# Patient Record
Sex: Female | Born: 1950 | Race: White | Hispanic: No | Smoking: Former smoker
Health system: Southern US, Community
[De-identification: ages and names within clinical notes are randomized; demographics above are authoritative.]

## PROBLEM LIST (undated history)

## (undated) DIAGNOSIS — E7800 Pure hypercholesterolemia, unspecified: Secondary | ICD-10-CM

## (undated) DIAGNOSIS — J189 Pneumonia, unspecified organism: Secondary | ICD-10-CM

## (undated) DIAGNOSIS — I10 Essential (primary) hypertension: Secondary | ICD-10-CM

## (undated) DIAGNOSIS — E119 Type 2 diabetes mellitus without complications: Secondary | ICD-10-CM

## (undated) HISTORY — PX: COLONOSCOPY W/ BIOPSIES AND POLYPECTOMY: SHX1376

---

## 2003-06-06 ENCOUNTER — Ambulatory Visit (HOSPITAL_COMMUNITY): Admission: RE | Admit: 2003-06-06 | Discharge: 2003-06-06 | Payer: Self-pay | Admitting: *Deleted

## 2013-01-07 ENCOUNTER — Other Ambulatory Visit (HOSPITAL_COMMUNITY)
Admission: RE | Admit: 2013-01-07 | Discharge: 2013-01-07 | Disposition: A | Discharge status: Home / Self Care | Payer: BC Managed Care – PPO | Source: Ambulatory Visit | Attending: Family Medicine | Admitting: Family Medicine

## 2013-01-07 ENCOUNTER — Other Ambulatory Visit: Payer: Self-pay | Admitting: Family Medicine

## 2013-01-07 DIAGNOSIS — Z124 Encounter for screening for malignant neoplasm of cervix: Secondary | ICD-10-CM | POA: Insufficient documentation

## 2013-03-22 ENCOUNTER — Other Ambulatory Visit: Payer: Self-pay | Admitting: Gastroenterology

## 2015-06-05 ENCOUNTER — Other Ambulatory Visit: Payer: Self-pay | Admitting: Gastroenterology

## 2017-05-13 DIAGNOSIS — Z0000 Encounter for general adult medical examination without abnormal findings: Secondary | ICD-10-CM | POA: Diagnosis not present

## 2017-05-13 DIAGNOSIS — E785 Hyperlipidemia, unspecified: Secondary | ICD-10-CM | POA: Diagnosis not present

## 2017-05-13 DIAGNOSIS — I129 Hypertensive chronic kidney disease with stage 1 through stage 4 chronic kidney disease, or unspecified chronic kidney disease: Secondary | ICD-10-CM | POA: Diagnosis not present

## 2017-05-13 DIAGNOSIS — Z23 Encounter for immunization: Secondary | ICD-10-CM | POA: Diagnosis not present

## 2017-05-13 DIAGNOSIS — E1121 Type 2 diabetes mellitus with diabetic nephropathy: Secondary | ICD-10-CM | POA: Diagnosis not present

## 2017-05-13 DIAGNOSIS — N182 Chronic kidney disease, stage 2 (mild): Secondary | ICD-10-CM | POA: Diagnosis not present

## 2017-09-22 DIAGNOSIS — E1121 Type 2 diabetes mellitus with diabetic nephropathy: Secondary | ICD-10-CM | POA: Diagnosis not present

## 2017-09-22 DIAGNOSIS — R197 Diarrhea, unspecified: Secondary | ICD-10-CM | POA: Diagnosis not present

## 2017-10-07 DIAGNOSIS — E860 Dehydration: Secondary | ICD-10-CM | POA: Diagnosis not present

## 2017-12-16 DIAGNOSIS — N182 Chronic kidney disease, stage 2 (mild): Secondary | ICD-10-CM | POA: Diagnosis not present

## 2017-12-16 DIAGNOSIS — E785 Hyperlipidemia, unspecified: Secondary | ICD-10-CM | POA: Diagnosis not present

## 2017-12-16 DIAGNOSIS — I129 Hypertensive chronic kidney disease with stage 1 through stage 4 chronic kidney disease, or unspecified chronic kidney disease: Secondary | ICD-10-CM | POA: Diagnosis not present

## 2017-12-16 DIAGNOSIS — E1121 Type 2 diabetes mellitus with diabetic nephropathy: Secondary | ICD-10-CM | POA: Diagnosis not present

## 2018-02-18 ENCOUNTER — Other Ambulatory Visit: Payer: Self-pay

## 2018-02-18 ENCOUNTER — Emergency Department (HOSPITAL_COMMUNITY)
Admission: EM | Admit: 2018-02-18 | Discharge: 2018-02-18 | Disposition: A | Discharge status: Home / Self Care | Payer: BLUE CROSS/BLUE SHIELD | Attending: Emergency Medicine | Admitting: Emergency Medicine

## 2018-02-18 ENCOUNTER — Emergency Department (HOSPITAL_COMMUNITY): Payer: BLUE CROSS/BLUE SHIELD

## 2018-02-18 ENCOUNTER — Encounter (HOSPITAL_COMMUNITY): Payer: Self-pay

## 2018-02-18 DIAGNOSIS — I10 Essential (primary) hypertension: Secondary | ICD-10-CM | POA: Insufficient documentation

## 2018-02-18 DIAGNOSIS — R197 Diarrhea, unspecified: Secondary | ICD-10-CM | POA: Diagnosis not present

## 2018-02-18 DIAGNOSIS — R112 Nausea with vomiting, unspecified: Secondary | ICD-10-CM | POA: Insufficient documentation

## 2018-02-18 DIAGNOSIS — R55 Syncope and collapse: Secondary | ICD-10-CM | POA: Diagnosis not present

## 2018-02-18 DIAGNOSIS — I959 Hypotension, unspecified: Secondary | ICD-10-CM | POA: Diagnosis not present

## 2018-02-18 DIAGNOSIS — E119 Type 2 diabetes mellitus without complications: Secondary | ICD-10-CM | POA: Insufficient documentation

## 2018-02-18 DIAGNOSIS — R109 Unspecified abdominal pain: Secondary | ICD-10-CM | POA: Diagnosis not present

## 2018-02-18 DIAGNOSIS — K4090 Unilateral inguinal hernia, without obstruction or gangrene, not specified as recurrent: Secondary | ICD-10-CM | POA: Diagnosis not present

## 2018-02-18 HISTORY — DX: Pure hypercholesterolemia, unspecified: E78.00

## 2018-02-18 HISTORY — DX: Essential (primary) hypertension: I10

## 2018-02-18 HISTORY — DX: Type 2 diabetes mellitus without complications: E11.9

## 2018-02-18 LAB — URINALYSIS, ROUTINE W REFLEX MICROSCOPIC
Bilirubin Urine: NEGATIVE
GLUCOSE, UA: NEGATIVE mg/dL
Ketones, ur: NEGATIVE mg/dL
Nitrite: NEGATIVE
PROTEIN: NEGATIVE mg/dL
Specific Gravity, Urine: 1.038 — ABNORMAL HIGH (ref 1.005–1.030)
pH: 5 (ref 5.0–8.0)

## 2018-02-18 LAB — C DIFFICILE QUICK SCREEN W PCR REFLEX
C Diff antigen: NEGATIVE
C Diff interpretation: NOT DETECTED
C Diff toxin: NEGATIVE

## 2018-02-18 LAB — LIPASE, BLOOD: Lipase: 42 U/L (ref 11–51)

## 2018-02-18 LAB — COMPREHENSIVE METABOLIC PANEL
ALBUMIN: 4.4 g/dL (ref 3.5–5.0)
ALK PHOS: 70 U/L (ref 38–126)
ALT: 18 U/L (ref 0–44)
AST: 21 U/L (ref 15–41)
Anion gap: 13 (ref 5–15)
BUN: 22 mg/dL (ref 8–23)
CALCIUM: 9.5 mg/dL (ref 8.9–10.3)
CHLORIDE: 107 mmol/L (ref 98–111)
CO2: 21 mmol/L — AB (ref 22–32)
CREATININE: 1.22 mg/dL — AB (ref 0.44–1.00)
GFR calc Af Amer: 52 mL/min — ABNORMAL LOW (ref >60)
GFR calc non Af Amer: 45 mL/min — ABNORMAL LOW (ref >60)
Glucose, Bld: 194 mg/dL — ABNORMAL HIGH (ref 70–99)
Potassium: 4.4 mmol/L (ref 3.5–5.1)
SODIUM: 141 mmol/L (ref 135–145)
Total Bilirubin: 1.5 mg/dL — ABNORMAL HIGH (ref 0.3–1.2)
Total Protein: 7.6 g/dL (ref 6.5–8.1)

## 2018-02-18 LAB — CBC
HCT: 45.4 % (ref 36.0–46.0)
Hemoglobin: 15.2 g/dL — ABNORMAL HIGH (ref 12.0–15.0)
MCH: 31.9 pg (ref 26.0–34.0)
MCHC: 33.5 g/dL (ref 30.0–36.0)
MCV: 95.4 fL (ref 78.0–100.0)
PLATELETS: 192 10*3/uL (ref 150–400)
RBC: 4.76 MIL/uL (ref 3.87–5.11)
RDW: 13 % (ref 11.5–15.5)
WBC: 19.6 10*3/uL — ABNORMAL HIGH (ref 4.0–10.5)

## 2018-02-18 MED ORDER — IOPAMIDOL (ISOVUE-370) INJECTION 76%
100.0000 mL | Freq: Once | INTRAVENOUS | Status: AC | PRN
  Administered 2018-02-18: 100 mL via INTRAVENOUS

## 2018-02-18 MED ORDER — LOPERAMIDE HCL 2 MG PO CAPS
4.0000 mg | ORAL_CAPSULE | Freq: Once | ORAL | Status: AC
  Administered 2018-02-18: 4 mg via ORAL
  Filled 2018-02-18: qty 2

## 2018-02-18 MED ORDER — SODIUM CHLORIDE 0.9 % IV BOLUS
1000.0000 mL | Freq: Once | INTRAVENOUS | Status: AC
  Administered 2018-02-18: 1000 mL via INTRAVENOUS

## 2018-02-18 MED ORDER — IOPAMIDOL (ISOVUE-300) INJECTION 61%
INTRAVENOUS | Status: AC
  Filled 2018-02-18: qty 100

## 2018-02-18 NOTE — ED Notes (Signed)
Patient verbalizes understanding of discharge instructions. Opportunity for questioning and answers were provided. 

## 2018-02-18 NOTE — Discharge Instructions (Addendum)
You were evaluated today for vomiting and diarrhea.  The results of your CT scan were negative.  He did have a mild elevation in your white count however we have not found a reason for this.  Your C. difficile cultures from your stool were negative.  Please follow-up with your primary care provider return to the ED if you experience any of the following symptoms  You have chest pain. You feel very weak or you pass out (faint). You have bloody or black poop or poop that look like tar. You have very bad pain, cramping, or bloating in your belly (abdomen). You have trouble breathing or you are breathing very quickly. Your heart is beating very quickly. Your skin feels cold and clammy. You feel confused. You have signs of dehydration, such as: Dark pee, hardly any pee, or no pee. Cracked lips. Dry mouth. Sunken eyes. Sleepiness. Weakness.

## 2018-02-18 NOTE — ED Notes (Signed)
ED Provider at bedside. 

## 2018-02-18 NOTE — ED Notes (Signed)
Patient transported to CT 

## 2018-02-18 NOTE — ED Notes (Signed)
Pt given diet ginger ale.

## 2018-02-18 NOTE — ED Provider Notes (Addendum)
[facility] EMERGENCY DEPARTMENT Provider Note   CSN: [id number] Arrival date & time: 02/18/18  1559   History   Chief Complaint Chief Complaint  Patient presents with  . Diarrhea  . Emesis    HPI Holly Stevenson is a 67 y.o. female with a PMHx significant for DM, HTN who presents for evaluation of sudden onset vomiting and diarrhea.  States she has had 2 episodes of emesis which consisted of her lunch food and 7 episodes of brown diarrhea.  States two episodes of diarrhea were light in color and mucousy.  Denies headache, chest pain, SOB,abdominal pain, constipation, rashes, urinary symptoms. Denies recent travel, sick contacts, antibiotic use. Has had previous episodes of similar nature. 4 x in 3 months. Had a stool culture which returned negative. Episodes self resolve in past.  HPI  Past Medical History:  Diagnosis Date  . Diabetes mellitus without complication (HCC)   . High cholesterol   . Hypertension     There are no active problems to display for this patient.   History reviewed. No pertinent surgical history.   OB History   None      Home Medications    Prior to Admission medications   Not on File    Family History No family history on file.  Social History Social History   Tobacco Use  . Smoking status: Not on file  Substance Use Topics  . Alcohol use: Not on file  . Drug use: Not on file     Allergies   Patient has no allergy information on record.   Review of Systems Review of Systems Review of systems negative unless otherwise stated in the HPI.  Physical Exam Updated Vital Signs BP (!) 162/53   Pulse 69   Temp 99.6 F (37.6 C) (Oral)   Resp 18   Ht 5\' 7"  (1.702 m)   Wt 79.4 kg   SpO2 96%   BMI 27.41 kg/m   Physical Exam  Constitutional: She appears well-developed and well-nourished. No distress.  HENT:  Head: Normocephalic and atraumatic.  Eyes: Pupils are equal, round, and reactive to light.  Conjunctivae are normal.  Neck: Normal range of motion. Neck supple.  Cardiovascular: Normal rate, regular rhythm, normal heart sounds and intact distal pulses.  No murmur heard. Pulmonary/Chest: Effort normal. No respiratory distress.  Abdominal: Soft. Bowel sounds are normal. She exhibits no shifting dullness, no distension, no pulsatile liver, no fluid wave, no abdominal bruit, no ascites, no pulsatile midline mass and no mass. There is no hepatosplenomegaly. There is no tenderness. There is no rigidity, no rebound, no guarding, no CVA tenderness, no tenderness at McBurney's point and negative Murphy's sign. No hernia.  Musculoskeletal: Normal range of motion.  Neurological: She is alert.  Skin: Skin is warm and dry. She is not diaphoretic.  Psychiatric: She has a normal mood and affect.  Nursing note and vitals reviewed.    ED Treatments / Results  Labs (all labs ordered are listed, but only abnormal results are displayed) Labs Reviewed  COMPREHENSIVE METABOLIC PANEL - Abnormal; Notable for the following components:      Result Value   CO2 21 (*)    Glucose, Bld 194 (*)    Creatinine, Ser 1.22 (*)    Total Bilirubin 1.5 (*)    GFR calc non Af Amer 45 (*)    GFR calc Af Amer 52 (*)    All other components within normal limits  CBC - Abnormal; Notable  for the following components:   WBC 19.6 (*)    Hemoglobin 15.2 (*)    All other components within normal limits  C DIFFICILE QUICK SCREEN W PCR REFLEX  LIPASE, BLOOD  URINALYSIS, ROUTINE W REFLEX MICROSCOPIC    EKG None  Radiology Ct Abdomen Pelvis W Contrast  Result Date: 02/18/2018 CLINICAL DATA:  67 year old female with sudden onset nausea vomiting and diarrhea. Abdominal pain. EXAM: CT ABDOMEN AND PELVIS WITH CONTRAST TECHNIQUE: Multidetector CT imaging of the abdomen and pelvis was performed using the standard protocol following bolus administration of intravenous contrast. CONTRAST:  ISOVUE-370 IOPAMIDOL  (ISOVUE-370) INJECTION 76% COMPARISON:  None. FINDINGS: Lower chest: Negative lung bases. No pericardial or pleural effusion. Hepatobiliary: Negative liver and gallbladder. No biliary ductal enlargement. Pancreas: Negative. Spleen: Negative. Adrenals/Urinary Tract: Normal adrenal glands. Bilateral renal enhancement and contrast excretion is symmetric and normal. Mild nonspecific bilateral perinephric stranding. Negative course of both ureters. Mild bladder wall thickening which may be related to small bladder volume. No perivesical stranding. Stomach/Bowel: Decompressed distal colon containing some fluid. Mild diverticulosis of the descending and sigmoid colon with no active inflammation. Decompressed transverse colon containing fluid. Fluid in the right colon. Normal appendix (coronal image 48). Negative terminal ileum. No dilated or inflamed small bowel. Small gastric hiatal hernia. Negative intraabdominal stomach, duodenum. No abdominal free air, free fluid. Vascular/Lymphatic: Aortoiliac calcified atherosclerosis. Major arterial structures in the abdomen and pelvis remain patent. Portal venous system is patent. No lymphadenopathy. Reproductive: Negative. Other: No pelvic free fluid. Small fat containing left inguinal hernia. Musculoskeletal: No acute osseous abnormality identified. Lumbar spine degeneration and spinal stenosis (L3-L4 series 3, image 43). No acute osseous abnormality identified. IMPRESSION: 1. Fluid throughout the large bowel compatible with diarrhea but otherwise no acute or inflammatory process in the abdomen or pelvis. Normal appendix. 2.  Aortic Atherosclerosis (ICD10-I70.0). Electronically Signed   By: Odessa Fleming M.D.   On: 02/18/2018 18:02    Procedures Procedures (including critical care time)  Medications Ordered in ED Medications  iopamidol (ISOVUE-300) 61 % injection (has no administration in time range)  sodium chloride 0.9 % bolus 1,000 mL (0 mLs Intravenous Stopped 02/18/18 1809)   iopamidol (ISOVUE-370) 76 % injection 100 mL (100 mLs Intravenous Contrast Given 02/18/18 1739)     Initial Impression / Assessment and Plan / ED Course  I have reviewed the triage vital signs and the nursing notes as well as past medical history.  Pertinent labs & imaging results that were available during my care of the patient were reviewed by me and considered in my medical decision making (see chart for details).  Presents for evaluation of emesis and diarrhea x 6 hours. Afebrile, non-septic appearing, non-ill appearing. Diarrhea with mucous. Nausea resolved with Zofran. Will obtain CT abd/pelvis, labs and C.Diff screen and reevaluate.  CT with fluid throughout consistent with diarrhea. Labs with Leukocytosis at 19.6. C.Diff negative. No rashes or urinary symptoms.  Mildly elevated creatinine at 1.22 .  Discussed with patient she will need recheck at primary care for this. Serial abd exams benign. Patient able to tolerate PO challenge. Patient suitable for dc home. Will dc with referral to GI (she prefers [facility]) as she has had episodes previously. 4x in the last 3 months for further evaluation. VSS. Discussed strict return precautions with patient. Patient verbalizes understanding.    20:22 Patient did not want to wait for urine results. Urine with Leukocytes. Nitrite negative. No urinary symptoms. No pharmacy in Epic. Tried to reach  patient on phone number provided however phone is disconnected.  Final Clinical Impressions(s) / ED Diagnoses   Final diagnoses:  Diarrhea, unspecified type  Non-intractable vomiting with nausea, unspecified vomiting type    ED Discharge Orders    None       Stephannie Broner A, PA-C 02/18/18 1947    Minta Fair A, PA-C 02/18/18 2044    Sabas Sous, MD 02/18/18 2257

## 2018-02-18 NOTE — ED Triage Notes (Addendum)
Pt bib ems from her work c.o sudden onset n/v/d around 1230pm. Pt reports 2 episodes of emesis and 7 episodes of diarrhea. Pt given 4mg  zofran IV and saline en route. Pt now denies nausea. Pt denies abd pain, chest pain or sob. Pt a.o upon arrival.  BP 152/51 HR64 96% on room air. CBG 180

## 2018-03-03 DIAGNOSIS — R197 Diarrhea, unspecified: Secondary | ICD-10-CM | POA: Diagnosis not present

## 2018-03-03 DIAGNOSIS — R194 Change in bowel habit: Secondary | ICD-10-CM | POA: Diagnosis not present

## 2018-03-03 DIAGNOSIS — R112 Nausea with vomiting, unspecified: Secondary | ICD-10-CM | POA: Diagnosis not present

## 2018-03-23 DIAGNOSIS — E1121 Type 2 diabetes mellitus with diabetic nephropathy: Secondary | ICD-10-CM | POA: Diagnosis not present

## 2018-07-03 DIAGNOSIS — Z1159 Encounter for screening for other viral diseases: Secondary | ICD-10-CM | POA: Diagnosis not present

## 2018-07-03 DIAGNOSIS — E785 Hyperlipidemia, unspecified: Secondary | ICD-10-CM | POA: Diagnosis not present

## 2018-07-03 DIAGNOSIS — Z23 Encounter for immunization: Secondary | ICD-10-CM | POA: Diagnosis not present

## 2018-07-03 DIAGNOSIS — E1121 Type 2 diabetes mellitus with diabetic nephropathy: Secondary | ICD-10-CM | POA: Diagnosis not present

## 2018-07-03 DIAGNOSIS — Z0000 Encounter for general adult medical examination without abnormal findings: Secondary | ICD-10-CM | POA: Diagnosis not present

## 2019-01-19 DIAGNOSIS — I129 Hypertensive chronic kidney disease with stage 1 through stage 4 chronic kidney disease, or unspecified chronic kidney disease: Secondary | ICD-10-CM | POA: Diagnosis not present

## 2019-01-19 DIAGNOSIS — E781 Pure hyperglyceridemia: Secondary | ICD-10-CM | POA: Diagnosis not present

## 2019-01-19 DIAGNOSIS — E1121 Type 2 diabetes mellitus with diabetic nephropathy: Secondary | ICD-10-CM | POA: Diagnosis not present

## 2019-01-19 DIAGNOSIS — N182 Chronic kidney disease, stage 2 (mild): Secondary | ICD-10-CM | POA: Diagnosis not present

## 2019-10-05 DIAGNOSIS — Z0000 Encounter for general adult medical examination without abnormal findings: Secondary | ICD-10-CM | POA: Diagnosis not present

## 2019-10-05 DIAGNOSIS — E1121 Type 2 diabetes mellitus with diabetic nephropathy: Secondary | ICD-10-CM | POA: Diagnosis not present

## 2020-04-17 HISTORY — PX: VASCULAR SURGERY: SHX849

## 2020-04-24 ENCOUNTER — Other Ambulatory Visit: Payer: Self-pay

## 2020-04-24 ENCOUNTER — Encounter (HOSPITAL_COMMUNITY): Payer: Self-pay | Admitting: Emergency Medicine

## 2020-04-24 ENCOUNTER — Inpatient Hospital Stay (HOSPITAL_COMMUNITY)
Admission: EM | Admit: 2020-04-24 | Discharge: 2020-04-29 | DRG: 300 | Disposition: A | Discharge status: Home / Self Care | Payer: BC Managed Care – PPO | Attending: Internal Medicine | Admitting: Internal Medicine

## 2020-04-24 DIAGNOSIS — E7800 Pure hypercholesterolemia, unspecified: Secondary | ICD-10-CM | POA: Diagnosis present

## 2020-04-24 DIAGNOSIS — I739 Peripheral vascular disease, unspecified: Secondary | ICD-10-CM

## 2020-04-24 DIAGNOSIS — E119 Type 2 diabetes mellitus without complications: Secondary | ICD-10-CM

## 2020-04-24 DIAGNOSIS — E11621 Type 2 diabetes mellitus with foot ulcer: Secondary | ICD-10-CM | POA: Diagnosis present

## 2020-04-24 DIAGNOSIS — L97529 Non-pressure chronic ulcer of other part of left foot with unspecified severity: Secondary | ICD-10-CM | POA: Diagnosis present

## 2020-04-24 DIAGNOSIS — I10 Essential (primary) hypertension: Secondary | ICD-10-CM | POA: Diagnosis present

## 2020-04-24 DIAGNOSIS — Z79899 Other long term (current) drug therapy: Secondary | ICD-10-CM

## 2020-04-24 DIAGNOSIS — J441 Chronic obstructive pulmonary disease with (acute) exacerbation: Secondary | ICD-10-CM

## 2020-04-24 DIAGNOSIS — Z7984 Long term (current) use of oral hypoglycemic drugs: Secondary | ICD-10-CM

## 2020-04-24 DIAGNOSIS — M009 Pyogenic arthritis, unspecified: Secondary | ICD-10-CM | POA: Diagnosis present

## 2020-04-24 DIAGNOSIS — I70229 Atherosclerosis of native arteries of extremities with rest pain, unspecified extremity: Secondary | ICD-10-CM

## 2020-04-24 DIAGNOSIS — M1A0620 Idiopathic chronic gout, left knee, without tophus (tophi): Secondary | ICD-10-CM

## 2020-04-24 DIAGNOSIS — E1152 Type 2 diabetes mellitus with diabetic peripheral angiopathy with gangrene: Secondary | ICD-10-CM | POA: Diagnosis not present

## 2020-04-24 DIAGNOSIS — Z20822 Contact with and (suspected) exposure to covid-19: Secondary | ICD-10-CM | POA: Diagnosis present

## 2020-04-24 DIAGNOSIS — I129 Hypertensive chronic kidney disease with stage 1 through stage 4 chronic kidney disease, or unspecified chronic kidney disease: Secondary | ICD-10-CM | POA: Diagnosis present

## 2020-04-24 DIAGNOSIS — E1140 Type 2 diabetes mellitus with diabetic neuropathy, unspecified: Secondary | ICD-10-CM | POA: Diagnosis present

## 2020-04-24 DIAGNOSIS — L089 Local infection of the skin and subcutaneous tissue, unspecified: Secondary | ICD-10-CM

## 2020-04-24 DIAGNOSIS — F17200 Nicotine dependence, unspecified, uncomplicated: Secondary | ICD-10-CM | POA: Diagnosis present

## 2020-04-24 DIAGNOSIS — Z23 Encounter for immunization: Secondary | ICD-10-CM

## 2020-04-24 DIAGNOSIS — L03032 Cellulitis of left toe: Principal | ICD-10-CM

## 2020-04-24 DIAGNOSIS — E785 Hyperlipidemia, unspecified: Secondary | ICD-10-CM | POA: Diagnosis present

## 2020-04-24 DIAGNOSIS — I96 Gangrene, not elsewhere classified: Secondary | ICD-10-CM

## 2020-04-24 DIAGNOSIS — E11628 Type 2 diabetes mellitus with other skin complications: Secondary | ICD-10-CM | POA: Diagnosis present

## 2020-04-24 DIAGNOSIS — J984 Other disorders of lung: Secondary | ICD-10-CM

## 2020-04-24 DIAGNOSIS — L03116 Cellulitis of left lower limb: Secondary | ICD-10-CM | POA: Diagnosis present

## 2020-04-24 DIAGNOSIS — J449 Chronic obstructive pulmonary disease, unspecified: Secondary | ICD-10-CM | POA: Diagnosis present

## 2020-04-24 DIAGNOSIS — N1831 Chronic kidney disease, stage 3a: Secondary | ICD-10-CM | POA: Diagnosis present

## 2020-04-24 DIAGNOSIS — E1122 Type 2 diabetes mellitus with diabetic chronic kidney disease: Secondary | ICD-10-CM | POA: Diagnosis present

## 2020-04-24 DIAGNOSIS — N183 Chronic kidney disease, stage 3 unspecified: Secondary | ICD-10-CM | POA: Diagnosis present

## 2020-04-24 DIAGNOSIS — I70263 Atherosclerosis of native arteries of extremities with gangrene, bilateral legs: Secondary | ICD-10-CM | POA: Diagnosis present

## 2020-04-24 NOTE — ED Triage Notes (Signed)
Pt c/o left food numbness that she has on and off for months started radiating to her knee during the past few days, pt denies any injury.

## 2020-04-25 ENCOUNTER — Inpatient Hospital Stay (HOSPITAL_COMMUNITY): Payer: BC Managed Care – PPO

## 2020-04-25 ENCOUNTER — Emergency Department (HOSPITAL_COMMUNITY): Payer: BC Managed Care – PPO

## 2020-04-25 ENCOUNTER — Encounter (HOSPITAL_COMMUNITY): Payer: Self-pay | Admitting: Certified Registered Nurse Anesthetist

## 2020-04-25 ENCOUNTER — Encounter (HOSPITAL_COMMUNITY): Payer: Self-pay | Admitting: Emergency Medicine

## 2020-04-25 DIAGNOSIS — Z23 Encounter for immunization: Secondary | ICD-10-CM | POA: Diagnosis not present

## 2020-04-25 DIAGNOSIS — F17200 Nicotine dependence, unspecified, uncomplicated: Secondary | ICD-10-CM | POA: Diagnosis present

## 2020-04-25 DIAGNOSIS — I739 Peripheral vascular disease, unspecified: Secondary | ICD-10-CM | POA: Diagnosis present

## 2020-04-25 DIAGNOSIS — E119 Type 2 diabetes mellitus without complications: Secondary | ICD-10-CM

## 2020-04-25 DIAGNOSIS — L089 Local infection of the skin and subcutaneous tissue, unspecified: Secondary | ICD-10-CM | POA: Diagnosis not present

## 2020-04-25 DIAGNOSIS — Z79899 Other long term (current) drug therapy: Secondary | ICD-10-CM | POA: Diagnosis not present

## 2020-04-25 DIAGNOSIS — E7800 Pure hypercholesterolemia, unspecified: Secondary | ICD-10-CM | POA: Diagnosis present

## 2020-04-25 DIAGNOSIS — I70229 Atherosclerosis of native arteries of extremities with rest pain, unspecified extremity: Secondary | ICD-10-CM | POA: Diagnosis not present

## 2020-04-25 DIAGNOSIS — E1152 Type 2 diabetes mellitus with diabetic peripheral angiopathy with gangrene: Secondary | ICD-10-CM | POA: Diagnosis present

## 2020-04-25 DIAGNOSIS — E1122 Type 2 diabetes mellitus with diabetic chronic kidney disease: Secondary | ICD-10-CM

## 2020-04-25 DIAGNOSIS — E785 Hyperlipidemia, unspecified: Secondary | ICD-10-CM | POA: Diagnosis present

## 2020-04-25 DIAGNOSIS — N1831 Chronic kidney disease, stage 3a: Secondary | ICD-10-CM

## 2020-04-25 DIAGNOSIS — J441 Chronic obstructive pulmonary disease with (acute) exacerbation: Secondary | ICD-10-CM

## 2020-04-25 DIAGNOSIS — J984 Other disorders of lung: Secondary | ICD-10-CM

## 2020-04-25 DIAGNOSIS — I96 Gangrene, not elsewhere classified: Secondary | ICD-10-CM | POA: Diagnosis not present

## 2020-04-25 DIAGNOSIS — R7881 Bacteremia: Secondary | ICD-10-CM

## 2020-04-25 DIAGNOSIS — E11628 Type 2 diabetes mellitus with other skin complications: Secondary | ICD-10-CM | POA: Diagnosis present

## 2020-04-25 DIAGNOSIS — M1A0620 Idiopathic chronic gout, left knee, without tophus (tophi): Secondary | ICD-10-CM | POA: Diagnosis present

## 2020-04-25 DIAGNOSIS — I10 Essential (primary) hypertension: Secondary | ICD-10-CM

## 2020-04-25 DIAGNOSIS — M009 Pyogenic arthritis, unspecified: Secondary | ICD-10-CM | POA: Diagnosis present

## 2020-04-25 DIAGNOSIS — E11621 Type 2 diabetes mellitus with foot ulcer: Secondary | ICD-10-CM | POA: Diagnosis present

## 2020-04-25 DIAGNOSIS — L03032 Cellulitis of left toe: Secondary | ICD-10-CM | POA: Diagnosis present

## 2020-04-25 DIAGNOSIS — J449 Chronic obstructive pulmonary disease, unspecified: Secondary | ICD-10-CM | POA: Diagnosis present

## 2020-04-25 DIAGNOSIS — L97529 Non-pressure chronic ulcer of other part of left foot with unspecified severity: Secondary | ICD-10-CM | POA: Diagnosis present

## 2020-04-25 DIAGNOSIS — I129 Hypertensive chronic kidney disease with stage 1 through stage 4 chronic kidney disease, or unspecified chronic kidney disease: Secondary | ICD-10-CM | POA: Diagnosis present

## 2020-04-25 DIAGNOSIS — N183 Chronic kidney disease, stage 3 unspecified: Secondary | ICD-10-CM | POA: Diagnosis present

## 2020-04-25 DIAGNOSIS — Z7984 Long term (current) use of oral hypoglycemic drugs: Secondary | ICD-10-CM | POA: Diagnosis not present

## 2020-04-25 DIAGNOSIS — L97519 Non-pressure chronic ulcer of other part of right foot with unspecified severity: Secondary | ICD-10-CM | POA: Diagnosis not present

## 2020-04-25 DIAGNOSIS — I70263 Atherosclerosis of native arteries of extremities with gangrene, bilateral legs: Secondary | ICD-10-CM | POA: Diagnosis present

## 2020-04-25 DIAGNOSIS — Z20822 Contact with and (suspected) exposure to covid-19: Secondary | ICD-10-CM | POA: Diagnosis present

## 2020-04-25 DIAGNOSIS — R52 Pain, unspecified: Secondary | ICD-10-CM | POA: Diagnosis not present

## 2020-04-25 DIAGNOSIS — E1140 Type 2 diabetes mellitus with diabetic neuropathy, unspecified: Secondary | ICD-10-CM | POA: Diagnosis present

## 2020-04-25 DIAGNOSIS — L03116 Cellulitis of left lower limb: Secondary | ICD-10-CM | POA: Diagnosis present

## 2020-04-25 LAB — SEDIMENTATION RATE: Sed Rate: 30 mm/hr — ABNORMAL HIGH (ref 0–22)

## 2020-04-25 LAB — CBG MONITORING, ED
Glucose-Capillary: 200 mg/dL — ABNORMAL HIGH (ref 70–99)
Glucose-Capillary: 213 mg/dL — ABNORMAL HIGH (ref 70–99)
Glucose-Capillary: 134 mg/dL — ABNORMAL HIGH (ref 70–99)
Glucose-Capillary: 148 mg/dL — ABNORMAL HIGH (ref 70–99)

## 2020-04-25 LAB — I-STAT CHEM 8, ED
BUN: 46 mg/dL — ABNORMAL HIGH (ref 8–23)
Calcium, Ion: 1.12 mmol/L — ABNORMAL LOW (ref 1.15–1.40)
Chloride: 101 mmol/L (ref 98–111)
Creatinine, Ser: 1.2 mg/dL — ABNORMAL HIGH (ref 0.44–1.00)
Glucose, Bld: 213 mg/dL — ABNORMAL HIGH (ref 70–99)
HCT: 46 % (ref 36.0–46.0)
Hemoglobin: 15.6 g/dL — ABNORMAL HIGH (ref 12.0–15.0)
Potassium: 4.1 mmol/L (ref 3.5–5.1)
Sodium: 136 mmol/L (ref 135–145)
TCO2: 23 mmol/L (ref 22–32)

## 2020-04-25 LAB — CBC WITH DIFFERENTIAL/PLATELET
Abs Immature Granulocytes: 0.03 10*3/uL (ref 0.00–0.07)
Basophils Absolute: 0 10*3/uL (ref 0.0–0.1)
Basophils Relative: 0 %
Eosinophils Absolute: 0.1 10*3/uL (ref 0.0–0.5)
Eosinophils Relative: 1 %
HCT: 45.8 % (ref 36.0–46.0)
Hemoglobin: 15 g/dL (ref 12.0–15.0)
Immature Granulocytes: 0 %
Lymphocytes Relative: 13 %
Lymphs Abs: 1.3 10*3/uL (ref 0.7–4.0)
MCH: 30.9 pg (ref 26.0–34.0)
MCHC: 32.8 g/dL (ref 30.0–36.0)
MCV: 94.4 fL (ref 80.0–100.0)
Monocytes Absolute: 1.1 10*3/uL — ABNORMAL HIGH (ref 0.1–1.0)
Monocytes Relative: 11 %
Neutro Abs: 7.5 10*3/uL (ref 1.7–7.7)
Neutrophils Relative %: 75 %
Platelets: 228 10*3/uL (ref 150–400)
RBC: 4.85 MIL/uL (ref 3.87–5.11)
RDW: 12.9 % (ref 11.5–15.5)
WBC: 10 10*3/uL (ref 4.0–10.5)
nRBC: 0 % (ref 0.0–0.2)

## 2020-04-25 LAB — HEMOGLOBIN A1C
Hgb A1c MFr Bld: 7.3 % — ABNORMAL HIGH (ref 4.8–5.6)
Mean Plasma Glucose: 162.81 mg/dL

## 2020-04-25 LAB — GLUCOSE, CAPILLARY: Glucose-Capillary: 138 mg/dL — ABNORMAL HIGH (ref 70–99)

## 2020-04-25 LAB — SYNOVIAL CELL COUNT + DIFF, W/ CRYSTALS
Crystals, Fluid: NONE SEEN
Eosinophils-Synovial: 0 % (ref 0–1)
Lymphocytes-Synovial Fld: 1 % (ref 0–20)
Monocyte-Macrophage-Synovial Fluid: 5 % — ABNORMAL LOW (ref 50–90)
Neutrophil, Synovial: 94 % — ABNORMAL HIGH (ref 0–25)
WBC, Synovial: 64500 /mm3 — ABNORMAL HIGH (ref 0–200)

## 2020-04-25 LAB — RESPIRATORY PANEL BY RT PCR (FLU A&B, COVID)
Influenza A by PCR: NEGATIVE
Influenza B by PCR: NEGATIVE
SARS Coronavirus 2 by RT PCR: NEGATIVE

## 2020-04-25 LAB — ECHOCARDIOGRAM COMPLETE
Area-P 1/2: 1.93 cm2
Height: 67 in
S' Lateral: 2.3 cm
Weight: 2800 oz

## 2020-04-25 LAB — HEPARIN LEVEL (UNFRACTIONATED): Heparin Unfractionated: 0.21 IU/mL — ABNORMAL LOW (ref 0.30–0.70)

## 2020-04-25 LAB — PROTIME-INR
INR: 1 (ref 0.8–1.2)
Prothrombin Time: 13 seconds (ref 11.4–15.2)

## 2020-04-25 LAB — PREALBUMIN: Prealbumin: 17.3 mg/dL — ABNORMAL LOW (ref 18–38)

## 2020-04-25 LAB — C-REACTIVE PROTEIN: CRP: 8.4 mg/dL — ABNORMAL HIGH (ref <1.0)

## 2020-04-25 LAB — HIV ANTIBODY (ROUTINE TESTING W REFLEX): HIV Screen 4th Generation wRfx: NONREACTIVE

## 2020-04-25 MED ORDER — VANCOMYCIN HCL 500 MG/100ML IV SOLN
500.0000 mg | Freq: Once | INTRAVENOUS | Status: AC
  Administered 2020-04-25: 500 mg via INTRAVENOUS
  Filled 2020-04-25: qty 100

## 2020-04-25 MED ORDER — HEPARIN (PORCINE) 25000 UT/250ML-% IV SOLN
1500.0000 [IU]/h | INTRAVENOUS | Status: DC
  Administered 2020-04-25: 1200 [IU]/h via INTRAVENOUS
  Administered 2020-04-26: 1350 [IU]/h via INTRAVENOUS
  Filled 2020-04-25: qty 250

## 2020-04-25 MED ORDER — EMPAGLIFLOZIN 25 MG PO TABS
25.0000 mg | ORAL_TABLET | Freq: Every day | ORAL | Status: DC
  Administered 2020-04-25 – 2020-04-29 (×4): 25 mg via ORAL
  Filled 2020-04-25 (×5): qty 1

## 2020-04-25 MED ORDER — VANCOMYCIN HCL 750 MG/150ML IV SOLN
750.0000 mg | Freq: Two times a day (BID) | INTRAVENOUS | Status: DC
  Administered 2020-04-25 – 2020-04-27 (×4): 750 mg via INTRAVENOUS
  Filled 2020-04-25 (×4): qty 150

## 2020-04-25 MED ORDER — TETANUS-DIPHTH-ACELL PERTUSSIS 5-2.5-18.5 LF-MCG/0.5 IM SUSY
0.5000 mL | PREFILLED_SYRINGE | Freq: Once | INTRAMUSCULAR | Status: AC
  Administered 2020-04-25: 0.5 mL via INTRAMUSCULAR
  Filled 2020-04-25: qty 0.5

## 2020-04-25 MED ORDER — ENOXAPARIN SODIUM 40 MG/0.4ML ~~LOC~~ SOLN: 40.0000 mg | Freq: Every day | SUBCUTANEOUS | Status: DC

## 2020-04-25 MED ORDER — HEPARIN (PORCINE) 25000 UT/250ML-% IV SOLN
12.0000 [IU]/kg/h | INTRAVENOUS | Status: DC
Start: 2020-04-25 — End: 2020-04-25

## 2020-04-25 MED ORDER — INSULIN ASPART 100 UNIT/ML ~~LOC~~ SOLN: 0.0000 [IU] | Freq: Three times a day (TID) | SUBCUTANEOUS | Status: DC

## 2020-04-25 MED ORDER — ONDANSETRON HCL 4 MG PO TABS: 4.0000 mg | ORAL_TABLET | Freq: Four times a day (QID) | ORAL | Status: DC | PRN

## 2020-04-25 MED ORDER — INSULIN ASPART 100 UNIT/ML ~~LOC~~ SOLN
0.0000 [IU] | SUBCUTANEOUS | Status: DC
  Administered 2020-04-25: 3 [IU] via SUBCUTANEOUS
  Administered 2020-04-25 (×2): 1 [IU] via SUBCUTANEOUS
  Administered 2020-04-25 – 2020-04-26 (×4): 2 [IU] via SUBCUTANEOUS
  Administered 2020-04-26: 1 [IU] via SUBCUTANEOUS
  Administered 2020-04-27 (×2): 2 [IU] via SUBCUTANEOUS
  Administered 2020-04-27: 3 [IU] via SUBCUTANEOUS
  Administered 2020-04-27 – 2020-04-28 (×2): 1 [IU] via SUBCUTANEOUS
  Administered 2020-04-28: 3 [IU] via SUBCUTANEOUS
  Administered 2020-04-29: 2 [IU] via SUBCUTANEOUS
  Administered 2020-04-29: 1 [IU] via SUBCUTANEOUS

## 2020-04-25 MED ORDER — ALBUTEROL SULFATE HFA 108 (90 BASE) MCG/ACT IN AERS
8.0000 | INHALATION_SPRAY | Freq: Once | RESPIRATORY_TRACT | Status: AC
  Administered 2020-04-25: 8 via RESPIRATORY_TRACT
  Filled 2020-04-25: qty 6.7

## 2020-04-25 MED ORDER — VANCOMYCIN HCL IN DEXTROSE 1-5 GM/200ML-% IV SOLN
1000.0000 mg | Freq: Once | INTRAVENOUS | Status: AC
  Administered 2020-04-25: 1000 mg via INTRAVENOUS
  Filled 2020-04-25: qty 200

## 2020-04-25 MED ORDER — ACETAMINOPHEN 650 MG RE SUPP: 650.0000 mg | Freq: Four times a day (QID) | RECTAL | Status: DC | PRN

## 2020-04-25 MED ORDER — METRONIDAZOLE 500 MG PO TABS
500.0000 mg | ORAL_TABLET | Freq: Three times a day (TID) | ORAL | Status: DC
  Administered 2020-04-25: 500 mg via ORAL
  Filled 2020-04-25: qty 1

## 2020-04-25 MED ORDER — METHYLPREDNISOLONE ACETATE 40 MG/ML IJ SUSP
40.0000 mg | Freq: Once | INTRAMUSCULAR | Status: DC
  Filled 2020-04-25: qty 1

## 2020-04-25 MED ORDER — ONDANSETRON HCL 4 MG/2ML IJ SOLN: 4.0000 mg | Freq: Four times a day (QID) | INTRAMUSCULAR | Status: DC | PRN

## 2020-04-25 MED ORDER — ACETAMINOPHEN 325 MG PO TABS
650.0000 mg | ORAL_TABLET | Freq: Four times a day (QID) | ORAL | Status: DC | PRN
  Administered 2020-04-25 – 2020-04-26 (×2): 650 mg via ORAL
  Filled 2020-04-25 (×2): qty 2

## 2020-04-25 MED ORDER — BENAZEPRIL HCL 5 MG PO TABS
10.0000 mg | ORAL_TABLET | Freq: Every day | ORAL | Status: DC
  Administered 2020-04-25 – 2020-04-29 (×5): 10 mg via ORAL
  Filled 2020-04-25 (×2): qty 1
  Filled 2020-04-25: qty 2
  Filled 2020-04-25: qty 1
  Filled 2020-04-25: qty 2

## 2020-04-25 MED ORDER — HEPARIN BOLUS VIA INFUSION
1100.0000 [IU] | Freq: Once | INTRAVENOUS | Status: AC
  Administered 2020-04-25: 1100 [IU] via INTRAVENOUS
  Filled 2020-04-25: qty 1100

## 2020-04-25 MED ORDER — PIPERACILLIN-TAZOBACTAM 3.375 G IVPB 30 MIN
3.3750 g | Freq: Once | INTRAVENOUS | Status: DC
  Filled 2020-04-25: qty 50

## 2020-04-25 MED ORDER — IOHEXOL 350 MG/ML SOLN
75.0000 mL | Freq: Once | INTRAVENOUS | Status: AC | PRN
  Administered 2020-04-25: 75 mL via INTRAVENOUS

## 2020-04-25 MED ORDER — BUPIVACAINE HCL (PF) 0.5 % IJ SOLN
10.0000 mL | Freq: Once | INTRAMUSCULAR | Status: DC
  Filled 2020-04-25: qty 10

## 2020-04-25 MED ORDER — ATORVASTATIN CALCIUM 10 MG PO TABS
20.0000 mg | ORAL_TABLET | Freq: Every day | ORAL | Status: DC
  Administered 2020-04-25 – 2020-04-29 (×5): 20 mg via ORAL
  Filled 2020-04-25 (×5): qty 2

## 2020-04-25 MED ORDER — INSULIN ASPART 100 UNIT/ML ~~LOC~~ SOLN: 0.0000 [IU] | Freq: Every day | SUBCUTANEOUS | Status: DC

## 2020-04-25 MED ORDER — SODIUM CHLORIDE 0.9 % IV SOLN
2.0000 g | Freq: Every day | INTRAVENOUS | Status: DC
  Administered 2020-04-25 – 2020-04-29 (×5): 2 g via INTRAVENOUS
  Filled 2020-04-25 (×5): qty 20

## 2020-04-25 MED ORDER — HEPARIN BOLUS VIA INFUSION
4000.0000 [IU] | Freq: Once | INTRAVENOUS | Status: AC
  Administered 2020-04-25: 4000 [IU] via INTRAVENOUS
  Filled 2020-04-25: qty 4000

## 2020-04-25 NOTE — ED Notes (Addendum)
Daughter Dania Marsan updated.

## 2020-04-25 NOTE — Consult Note (Signed)
WOC Nurse Consult Note: Reason for Consult: Left foot wound. Left 4th toenail removed several weeks ago by PCP, now 5th digit with blue discoloration. Lifelong smoker, left knee with erythema and warmth, faint red streaks from foot to knee. Pain is at knee, not foot/toes. Wound type: infectious Pressure Injury POA: N/A Measurement: 4th nail removed, dried serum at that site, blue discoloration noted above at 5th digit. Wound bed:N/A Drainage (amount, consistency, odor) None Periwound: See above Dressing procedure/placement/frequency: I will recommend that 4th and 5th digits on left foot be painted twice daily with betadine swabstick and allowed to air-dry. Dry dressing and place foot into Prevalon boot.  Recommend consultation with Vascular surgery to evaluate perfusion status of the LLE. If you agree, please order/arrange.  WOC nursing team will not follow, but will remain available to this patient, the nursing and medical teams.  Please re-consult if needed. Thanks, Ladona Mow, MSN, RN, GNP, [place]  Pager# [phone]

## 2020-04-25 NOTE — ED Notes (Signed)
Pt called x 3  No answer. 

## 2020-04-25 NOTE — Progress Notes (Signed)
°  Echocardiogram 2D Echocardiogram has been performed.  Holly Stevenson 04/25/2020, 2:18 PM

## 2020-04-25 NOTE — H&P (Signed)
History and Physical    Holly Stevenson GHW:[id number] DOB: 26-Jan-1951 DOA: 04/24/2020  PCP: Maurice Small, MD  Patient coming from: Home  I have personally briefly reviewed patient's old medical records in [facility]  Chief Complaint: Knee pain  HPI: Holly Stevenson is a 69 y.o. female with medical history significant of DM2, HTN, smoking, HLD.  Pt had toenail on L fourth toe removed a couple of weeks ago.  Area turned black and now pt with progressive pain swelling, warmth, and tingling below the knee and now involving knee for past several days.  Symptoms are gradually worsening, nothing makes better or worse.  Denies CP or SOB, does have current wheezing, no fever.  ED Course: Started on zosyn / vanc and empiric heparin for concern for thrombophlebitis of LLE and diabetic foot infection.  Creat 1.2 unchanged from 2019.  CTA chest: neg for PE, but she has a LUL cavitary lesion.  Pt denies known exposure to TB, is a current every day smoker.   Review of Systems: As per HPI, otherwise all review of systems negative.  Past Medical History:  Diagnosis Date  . Diabetes mellitus without complication (HCC)   . High cholesterol   . Hypertension     History reviewed. No pertinent surgical history.   reports that she has been smoking. She does not have any smokeless tobacco history on file. She reports that she does not drink alcohol and does not use drugs.  No Known Allergies  Family History  Problem Relation Age of Onset  . Peripheral Artery Disease Neg Hx      Prior to Admission medications   Medication Sig Start Date End Date Taking? Authorizing Provider  atorvastatin (LIPITOR) 20 MG tablet Take 20 mg by mouth daily. 02/11/20  Yes [provider]  benazepril (LOTENSIN) 10 MG tablet Take 10 mg by mouth daily. 02/11/20  Yes [provider]  cholecalciferol (VITAMIN D3) 25 MCG (1000 UNIT) tablet Take 1,000 Units by mouth daily.   Yes  [provider]  JARDIANCE 25 MG TABS tablet Take 25 mg by mouth daily. 04/16/20  Yes [provider]  metFORMIN (GLUCOPHAGE) 500 MG tablet Take 500 mg by mouth 2 (two) times daily. 02/23/20  Yes [provider]  Multiple Vitamins-Minerals (MULTIVITAMIN WITH MINERALS) tablet Take 1 tablet by mouth daily.   Yes [provider]    Physical Exam: Vitals:   04/24/20 2105 04/25/20 0003 04/25/20 0304 04/25/20 0400  BP: (!) 154/51 (!) 167/58 (!) 151/89 (!) 182/63  Pulse: 66 60 64 67  Resp: 17 18 18 19   Temp:      TempSrc:      SpO2: 93% 97% 94% 94%    Constitutional: NAD, calm, comfortable Eyes: PERRL, lids and conjunctivae normal ENMT: Mucous membranes are moist. Posterior pharynx clear of any exudate or lesions.Normal dentition.  Neck: normal, supple, no masses, no thyromegaly Respiratory: clear to auscultation bilaterally, no wheezing, no crackles. Normal respiratory effort. No accessory muscle use.  Cardiovascular: Regular rate and rhythm, no murmurs / rubs / gallops. No extremity edema. 2+ pedal pulses. No carotid bruits.  Abdomen: no tenderness, no masses palpated. No hepatosplenomegaly. Bowel sounds positive.  Musculoskeletal: LLE warmth, mild erythema, and swelling. Skin: 26mm necrotic area under toe nail bed of 4th L toe, remainder of toe looks dusky in appearance. Neurologic: CN 2-12 grossly intact. Sensation intact, DTR normal. Strength 5/5 in all 4.  Psychiatric: Normal judgment and insight. Alert and oriented x  3. Normal mood.    Labs on Admission: I have personally reviewed following labs and imaging studies  CBC: Recent Labs  Lab 04/25/20 0345 04/25/20 0357  WBC 10.0  --   NEUTROABS 7.5  --   HGB 15.0 15.6*  HCT 45.8 46.0  MCV 94.4  --   PLT 228  --    Basic Metabolic Panel: Recent Labs  Lab 04/25/20 0357  NA 136  K 4.1  CL 101  GLUCOSE 213*  BUN 46*  CREATININE 1.20*   GFR: CrCl cannot be calculated (Unknown ideal  weight.). Liver Function Tests: No results for input(s): AST, ALT, ALKPHOS, BILITOT, PROT, ALBUMIN in the last 168 hours. No results for input(s): LIPASE, AMYLASE in the last 168 hours. No results for input(s): AMMONIA in the last 168 hours. Coagulation Profile: Recent Labs  Lab 04/25/20 0345  INR 1.0   Cardiac Enzymes: No results for input(s): CKTOTAL, CKMB, CKMBINDEX, TROPONINI in the last 168 hours. BNP (last 3 results) No results for input(s): PROBNP in the last 8760 hours. HbA1C: No results for input(s): HGBA1C in the last 72 hours. CBG: Recent Labs  Lab 04/25/20 0426  GLUCAP 200*   Lipid Profile: No results for input(s): CHOL, HDL, LDLCALC, TRIG, CHOLHDL, LDLDIRECT in the last 72 hours. Thyroid Function Tests: No results for input(s): TSH, T4TOTAL, FREET4, T3FREE, THYROIDAB in the last 72 hours. Anemia Panel: No results for input(s): VITAMINB12, FOLATE, FERRITIN, TIBC, IRON, RETICCTPCT in the last 72 hours. Urine analysis:    Component Value Date/Time   COLORURINE YELLOW 02/18/2018 1927   APPEARANCEUR HAZY (A) 02/18/2018 1927   LABSPEC 1.038 (H) 02/18/2018 1927   PHURINE 5.0 02/18/2018 1927   GLUCOSEU NEGATIVE 02/18/2018 1927   HGBUR MODERATE (A) 02/18/2018 1927   BILIRUBINUR NEGATIVE 02/18/2018 1927   KETONESUR NEGATIVE 02/18/2018 1927   PROTEINUR NEGATIVE 02/18/2018 1927   NITRITE NEGATIVE 02/18/2018 1927   LEUKOCYTESUR MODERATE (A) 02/18/2018 1927    Radiological Exams on Admission: DG Tibia/Fibula Left  Result Date: 04/25/2020 CLINICAL DATA:  Pain EXAM: LEFT TIBIA AND FIBULA - 2 VIEW COMPARISON:  None. FINDINGS: There is no evidence of fracture or other focal bone lesions. Soft tissues are unremarkable. IMPRESSION: Negative. Electronically Signed   By: Katherine Mantle M.D.   On: 04/25/2020 03:48   CT Angio Chest PE W and/or Wo Contrast  Result Date: 04/25/2020 CLINICAL DATA:  Chest pain or shortness of breath with pleurisy or effusion suspected. EXAM:  CT ANGIOGRAPHY CHEST WITH CONTRAST TECHNIQUE: Multidetector CT imaging of the chest was performed using the standard protocol during bolus administration of intravenous contrast. Multiplanar CT image reconstructions and MIPs were obtained to evaluate the vascular anatomy. CONTRAST:  37mL OMNIPAQUE IOHEXOL 350 MG/ML SOLN COMPARISON:  None. FINDINGS: Cardiovascular: Satisfactory opacification of the pulmonary arteries to the segmental level. No evidence of pulmonary embolism. Normal heart size. No pericardial effusion. Diffuse atheromatous calcification of the aorta and great vessels. Coronary calcifications on the left and right. Mediastinum/Nodes: Negative for adenopathy or mass. Lungs/Pleura: Cavitary lesion in the right upper lobe with irregular wall, 2.4 x 4.5 cm on coronal reformats. There is regional nodularity which is likely endobronchial spread of debris. This could be malignancy or atypical infection, including tuberculosis. The finding is known given it has already been charted in epic. Non infectious granulomatous disease is considered less likely given the solitary lesion. There are a few areas of mucoid impaction superimposed on generalized airway thickening. Centrilobular emphysema Upper Abdomen: Negative Musculoskeletal: Spondylosis.  No acute or aggressive finding. Review of the MIP images confirms the above findings. IMPRESSION: 1. Large cavitary lesion in the right upper lobe, likely either atypical infection (including tuberculosis) or malignancy. 2. Right upper lobe airspace disease that is probably endobronchial spread of debris/infection. 3. Negative for pulmonary embolism. 4. Aortic Atherosclerosis (ICD10-I70.0) and Emphysema (ICD10-J43.9). Electronically Signed   By: Marnee SpringJonathon  Watts M.D.   On: 04/25/2020 05:03   DG Foot Complete Left  Result Date: 04/25/2020 CLINICAL DATA:  Pain in the fourth digit. EXAM: LEFT FOOT - COMPLETE 3+ VIEW COMPARISON:  None. FINDINGS: There are advanced  degenerative changes of the first metatarsophalangeal joint. There is no definite acute displaced fracture or dislocation. There is no radiographic evidence for osteomyelitis. There is soft tissue swelling about the fourth digit. IMPRESSION: 1. No acute displaced fracture or dislocation. 2. Soft tissue swelling about the fourth digit. 3. Advanced degenerative changes of the first metatarsophalangeal joint. Electronically Signed   By: Katherine Mantlehristopher  Green M.D.   On: 04/25/2020 03:49    EKG: Independently reviewed.  Assessment/Plan Principal Problem:   Diabetic infection of left foot (HCC) Active Problems:   DM2 (diabetes mellitus, type 2) (HCC)   CKD (chronic kidney disease) stage 3, GFR 30-59 ml/min (HCC)   HTN (hypertension)   Cavitary lesion of lung    1. Diabetic infection of L foot - 1. Diabetic foot infection pathway 2. Got zosyn / vanc in ED, will leave on empiric rocephin / flagyl for the moment 3. Likely warrants surgical consultation in AM 4. Checking ABIs 5. Concern for possible thrombophlebitis of LLE - 1. US DVT ordered 2. EDP started pt on empiric heparin gtt for the moment 2. Cavitary lesion of RUL of lung - 1. Atypical infection (IE TB) vs malignancy 2. quantiferon TB gold ordered and pending 3. Empiric airborne precautions 4. Needs pulm consult in AM - will send a secure chat to their on call to give them a heads up. 5. Will keep NPO for possible bronchoscopy or other procedure 3. DM2 -  1. Sensitive SSI Q4H 4. HTN - 1. Cont home benazepril 5. CKD stage 3a - 1. Chronic, stable, and likely baseline, Creat unchanged from 2019  DVT prophylaxis: Heparin gtt Code Status: Full Family Communication: No family in room Disposition Plan: Home after LE infection controlled and work up for RUL lung lesion Consults called: None, put in secure chat to PCCM Admission status: Admit to inpatient  Severity of Illness: The appropriate patient status for this patient is  INPATIENT. Inpatient status is judged to be reasonable and necessary in order to provide the required intensity of service to ensure the patient's safety. The patient's presenting symptoms, physical exam findings, and initial radiographic and laboratory data in the context of their chronic comorbidities is felt to place them at high risk for further clinical deterioration. Furthermore, it is not anticipated that the patient will be medically stable for discharge from the hospital within 2 midnights of admission. The following factors support the patient status of inpatient.   IP status due to diabetic foot wound infection.  Also needs work up for the RUL cavitary lesion.   * I certify that at the point of admission it is my clinical judgment that the patient will require inpatient hospital care spanning beyond 2 midnights from the point of admission due to high intensity of service, high risk for further deterioration and high frequency of surveillance required.Julian Reil*    Jabreel Chimento M. DO [facility]  How to contact the [facility] Attending or Consulting provider 7A - 7P or covering provider during after hours 7P -7A, for this patient?  1. Check the care team in [facility] and look for a) attending/consulting [facility] provider listed and b) the [facility] team listed 2. Log into [email or web address]  Amion Physician Scheduling and messaging for groups and whole hospitals  On call and physician scheduling software for group practices, residents, hospitalists and other medical providers for call, clinic, rotation and shift schedules. On[employer] is a hospital-wide system for scheduling doctors and paging doctors on call. EasyPlot is for scientific plotting and data analysis.  [email or web address]  and use [place]'s universal password to access. If you do not have the password, please contact the hospital operator.  3. Locate the [facility] provider you are looking for under [facility] and page to a number that you can be  directly reached. 4. If you still have difficulty reaching the provider, please page the [facility] (Director on Call) for the Hospitalists listed on amion for assistance.  04/25/2020, 4:59 AM

## 2020-04-25 NOTE — Progress Notes (Signed)
Received Pt on a stretcher at 18:58, alert and oriented x4, assisted to bed, CHG bath done, vital signs taken and recorded. Pt on NPO, continued airborne precautions. Endorsed accordingly to oncoming RN.

## 2020-04-25 NOTE — Progress Notes (Signed)
ANTICOAGULATION CONSULT NOTE - Initial Consult  Pharmacy Consult for Heparin Indication: R/O DVT  No Known Allergies  Patient Measurements: Height: 5\' 7"  (170.2 cm) Weight: 79.4 kg (175 lb) IBW/kg (Calculated) : 61.6 Heparin Dosing Weight: 75 kg  Vital Signs: BP: 182/63 (11/09 0400) Pulse Rate: 67 (11/09 0400)  Labs: Recent Labs    04/25/20 0345 04/25/20 0357  HGB 15.0 15.6*  HCT 45.8 46.0  PLT 228  --   LABPROT 13.0  --   INR 1.0  --   CREATININE  --  1.20*    Estimated Creatinine Clearance: 47.3 mL/min (A) (by C-G formula based on SCr of 1.2 mg/dL (H)).   Medical History: Past Medical History:  Diagnosis Date  . Diabetes mellitus without complication (HCC)   . High cholesterol   . Hypertension     Medications:  No current facility-administered medications on file prior to encounter.   Current Outpatient Medications on File Prior to Encounter  Medication Sig Dispense Refill  . atorvastatin (LIPITOR) 20 MG tablet Take 20 mg by mouth daily.    . benazepril (LOTENSIN) 10 MG tablet Take 10 mg by mouth daily.    . cholecalciferol (VITAMIN D3) 25 MCG (1000 UNIT) tablet Take 1,000 Units by mouth daily.    13/09/21 JARDIANCE 25 MG TABS tablet Take 25 mg by mouth daily.    . metFORMIN (GLUCOPHAGE) 500 MG tablet Take 500 mg by mouth 2 (two) times daily.    . Multiple Vitamins-Minerals (MULTIVITAMIN WITH MINERALS) tablet Take 1 tablet by mouth daily.       Assessment: 69 y.o. female with LLE swelling, possible DVT, for heparin  Goal of Therapy:  Heparin level 0.3-0.7 units/ml Monitor platelets by anticoagulation protocol: Yes   Plan:  Heparin 4000 units IV bolus, then start heparin 1200 units/hr Check heparin level in 8 hours.   66 04/25/2020,5:21 AM

## 2020-04-25 NOTE — ED Provider Notes (Addendum)
[facility] EMERGENCY DEPARTMENT Provider Note   CSN: [id number] Arrival date & time: 04/24/20  1635     History Chief Complaint  Patient presents with  . Knee Pain    Holly Stevenson is a 68 y.o. female.  The history is provided by the patient.  Knee Pain Location:  Knee and foot Lower extremity injury: toenail removed weeks ago.   Knee location:  L knee Foot location:  L foot Pain details:    Quality:  Aching   Radiates to:  L leg   Severity:  Moderate   Onset quality:  Gradual   Timing:  Constant   Progression:  Worsening Chronicity:  New Foreign body present:  No foreign bodies Tetanus status:  Up to date Prior injury to area:  No Relieved by:  Nothing Worsened by:  Nothing Ineffective treatments:  None tried Associated symptoms: swelling   Associated symptoms: no fever   Risk factors: no concern for non-accidental trauma   Patient with DM and HTN who had a toenail on the fourth toe removed and the area turned black.  No swelling and warmth and tingling below the knee for the last several days.  Current smoker also wheezing but denies CP and SOB.  IS covid vaccinated.       Past Medical History:  Diagnosis Date  . Diabetes mellitus without complication (HCC)   . High cholesterol   . Hypertension     There are no problems to display for this patient.   History reviewed. No pertinent surgical history.   OB History   No obstetric history on file.     History reviewed. No pertinent family history.  Social History   Tobacco Use  . Smoking status: Not on file  Substance Use Topics  . Alcohol use: Not on file  . Drug use: Not on file    Home Medications Prior to Admission medications   Medication Sig Start Date End Date Taking? Authorizing Provider  atorvastatin (LIPITOR) 20 MG tablet Take 20 mg by mouth daily. 02/11/20  Yes [provider]  benazepril (LOTENSIN) 10 MG tablet Take 10 mg by mouth daily. 02/11/20   Yes [provider]  cholecalciferol (VITAMIN D3) 25 MCG (1000 UNIT) tablet Take 1,000 Units by mouth daily.   Yes [provider]  JARDIANCE 25 MG TABS tablet Take 25 mg by mouth daily. 04/16/20  Yes [provider]  metFORMIN (GLUCOPHAGE) 500 MG tablet Take 500 mg by mouth 2 (two) times daily. 02/23/20  Yes [provider]  Multiple Vitamins-Minerals (MULTIVITAMIN WITH MINERALS) tablet Take 1 tablet by mouth daily.   Yes [provider]    Allergies    Patient has no known allergies.  Review of Systems   Review of Systems  Constitutional: Negative for fever.  HENT: Negative for congestion.   Eyes: Negative for visual disturbance.  Respiratory: Negative for cough and shortness of breath.   Cardiovascular: Positive for leg swelling. Negative for chest pain and palpitations.  Gastrointestinal: Negative for abdominal pain.  Genitourinary: Negative for difficulty urinating.  Musculoskeletal: Positive for arthralgias.  Skin: Negative for color change.  Neurological: Negative for dizziness.  Psychiatric/Behavioral: Negative for agitation.  All other systems reviewed and are negative.   Physical Exam Updated Vital Signs BP (!) 182/63   Pulse 67   Temp 98.6 F (37 C) (Oral)   Resp 19   SpO2 94%   Physical Exam Vitals and nursing note reviewed.  Constitutional:  General: She is not in acute distress.    Appearance: Normal appearance.  HENT:     Head: Normocephalic and atraumatic.     Right Ear: Tympanic membrane normal.     Left Ear: Tympanic membrane normal.     Nose: Nose normal.  Eyes:     Conjunctiva/sclera: Conjunctivae normal.     Pupils: Pupils are equal, round, and reactive to light.  Cardiovascular:     Rate and Rhythm: Normal rate and regular rhythm.     Pulses: Normal pulses.     Heart sounds: Normal heart sounds.  Pulmonary:     Effort: Pulmonary effort is normal.     Breath sounds: Wheezing present.  Abdominal:      General: Abdomen is flat. Bowel sounds are normal.     Palpations: Abdomen is soft.     Tenderness: There is no abdominal tenderness. There is no guarding or rebound.  Musculoskeletal:        General: Swelling and tenderness present.     Cervical back: Normal range of motion and neck supple. No rigidity.       Legs:       Feet:  Lymphadenopathy:     Cervical: No cervical adenopathy.  Skin:    General: Skin is warm and dry.     Capillary Refill: Capillary refill takes less than 2 seconds.  Neurological:     General: No focal deficit present.     Mental Status: She is alert and oriented to person, place, and time.     Cranial Nerves: No cranial nerve deficit.     Deep Tendon Reflexes: Reflexes normal.  Psychiatric:        Mood and Affect: Mood normal.        Behavior: Behavior normal.     ED Results / Procedures / Treatments   Labs (all labs ordered are listed, but only abnormal results are displayed) Results for orders placed or performed during the hospital encounter of 04/24/20  CBC with Differential/Platelet  Result Value Ref Range   WBC 10.0 4.0 - 10.5 K/uL   RBC 4.85 3.87 - 5.11 MIL/uL   Hemoglobin 15.0 12.0 - 15.0 g/dL   HCT 39.7 36 - 46 %   MCV 94.4 80.0 - 100.0 fL   MCH 30.9 26.0 - 34.0 pg   MCHC 32.8 30.0 - 36.0 g/dL   RDW 67.3 41.9 - 37.9 %   Platelets 228 150 - 400 K/uL   nRBC 0.0 0.0 - 0.2 %   Neutrophils Relative % 75 %   Neutro Abs 7.5 1.7 - 7.7 K/uL   Lymphocytes Relative 13 %   Lymphs Abs 1.3 0.7 - 4.0 K/uL   Monocytes Relative 11 %   Monocytes Absolute 1.1 (H) 0.1 - 1.0 K/uL   Eosinophils Relative 1 %   Eosinophils Absolute 0.1 0.0 - 0.5 K/uL   Basophils Relative 0 %   Basophils Absolute 0.0 0.0 - 0.1 K/uL   Immature Granulocytes 0 %   Abs Immature Granulocytes 0.03 0.00 - 0.07 K/uL  Protime-INR  Result Value Ref Range   Prothrombin Time 13.0 11.4 - 15.2 seconds   INR 1.0 0.8 - 1.2  I-stat chem 8, ED (not at [facility] or [facility])  Result Value  Ref Range   Sodium 136 135 - 145 mmol/L   Potassium 4.1 3.5 - 5.1 mmol/L   Chloride 101 98 - 111 mmol/L   BUN 46 (H) 8 - 23 mg/dL   Creatinine, Ser 0.24 (  H) 0.44 - 1.00 mg/dL   Glucose, Bld 562 (H) 70 - 99 mg/dL   Calcium, Ion 1.30 (L) 1.15 - 1.40 mmol/L   TCO2 23 22 - 32 mmol/L   Hemoglobin 15.6 (H) 12.0 - 15.0 g/dL   HCT 86.5 36 - 46 %  CBG monitoring, ED  Result Value Ref Range   Glucose-Capillary 200 (H) 70 - 99 mg/dL   Comment 1 Notify RN    Comment 2 Document in Chart    DG Tibia/Fibula Left  Result Date: 04/25/2020 CLINICAL DATA:  Pain EXAM: LEFT TIBIA AND FIBULA - 2 VIEW COMPARISON:  None. FINDINGS: There is no evidence of fracture or other focal bone lesions. Soft tissues are unremarkable. IMPRESSION: Negative. Electronically Signed   By: Katherine Mantle M.D.   On: 04/25/2020 03:48   DG Foot Complete Left  Result Date: 04/25/2020 CLINICAL DATA:  Pain in the fourth digit. EXAM: LEFT FOOT - COMPLETE 3+ VIEW COMPARISON:  None. FINDINGS: There are advanced degenerative changes of the first metatarsophalangeal joint. There is no definite acute displaced fracture or dislocation. There is no radiographic evidence for osteomyelitis. There is soft tissue swelling about the fourth digit. IMPRESSION: 1. No acute displaced fracture or dislocation. 2. Soft tissue swelling about the fourth digit. 3. Advanced degenerative changes of the first metatarsophalangeal joint. Electronically Signed   By: Katherine Mantle M.D.   On: 04/25/2020 03:49    EKG EKG Interpretation  Date/Time:  Tuesday April 25 2020 03:41:41 EST Ventricular Rate:  67 PR Interval:    QRS Duration: 87 QT Interval:  380 QTC Calculation: 402 R Axis:   79 Text Interpretation: Sinus rhythm Confirmed by Nicanor Alcon, Macky Galik ([id number]) on 04/25/2020 4:01:59 AM   Radiology DG Tibia/Fibula Left  Result Date: 04/25/2020 CLINICAL DATA:  Pain EXAM: LEFT TIBIA AND FIBULA - 2 VIEW COMPARISON:  None. FINDINGS: There is no evidence  of fracture or other focal bone lesions. Soft tissues are unremarkable. IMPRESSION: Negative. Electronically Signed   By: Katherine Mantle M.D.   On: 04/25/2020 03:48   DG Foot Complete Left  Result Date: 04/25/2020 CLINICAL DATA:  Pain in the fourth digit. EXAM: LEFT FOOT - COMPLETE 3+ VIEW COMPARISON:  None. FINDINGS: There are advanced degenerative changes of the first metatarsophalangeal joint. There is no definite acute displaced fracture or dislocation. There is no radiographic evidence for osteomyelitis. There is soft tissue swelling about the fourth digit. IMPRESSION: 1. No acute displaced fracture or dislocation. 2. Soft tissue swelling about the fourth digit. 3. Advanced degenerative changes of the first metatarsophalangeal joint. Electronically Signed   By: Katherine Mantle M.D.   On: 04/25/2020 03:49    Procedures Procedures (including critical care time)  Medications Ordered in ED Medications  vancomycin (VANCOCIN) IVPB 1000 mg/200 mL premix (1,000 mg Intravenous New Bag/Given 04/25/20 0525)  benazepril (LOTENSIN) tablet 10 mg (has no administration in time range)  atorvastatin (LIPITOR) tablet 20 mg (has no administration in time range)  cefTRIAXone (ROCEPHIN) 2 g in sodium chloride 0.9 % 100 mL IVPB (has no administration in time range)    And  metroNIDAZOLE (FLAGYL) tablet 500 mg (500 mg Oral Given 04/25/20 0527)  acetaminophen (TYLENOL) tablet 650 mg (has no administration in time range)    Or  acetaminophen (TYLENOL) suppository 650 mg (has no administration in time range)  ondansetron (ZOFRAN) tablet 4 mg (has no administration in time range)    Or  ondansetron (ZOFRAN) injection 4 mg (has no administration in time range)  insulin aspart (novoLOG) injection 0-9 Units (has no administration in time range)  heparin bolus via infusion 4,000 Units (has no administration in time range)  heparin ADULT infusion 100 units/mL (25000 units/24950mL sodium chloride 0.45%) (has no  administration in time range)  Tdap (BOOSTRIX) injection 0.5 mL (0.5 mLs Intramuscular Given 04/25/20 0351)  albuterol (VENTOLIN HFA) 108 (90 Base) MCG/ACT inhaler 8 puff (8 puffs Inhalation Given 04/25/20 0354)  iohexol (OMNIPAQUE) 350 MG/ML injection 75 mL (75 mLs Intravenous Contrast Given 04/25/20 0436)    MDM Reviewed: nursing note and vitals Interpretation: labs, x-ray and CT scan (cavitary lesion on CT by me no gas in the soft tissues by me ) Total time providing critical care: 30-74 minutes (heparin drip for DVT). This excludes time spent performing separately reportable procedures and services.  CRITICAL CARE Performed by: Veralyn Lopp K Raffi Milstein-Rasch Total critical care time:60 minutes Critical care time was exclusive of separately billable procedures and treating other patients. Critical care was necessary to treat or prevent imminent or life-threatening deterioration. Critical care was time spent personally by me on the following activities: development of treatment plan with patient and/or surrogate as well as nursing, discussions with consultants, evaluation of patient's response to treatment, examination of patient, obtaining history from patient or surrogate, ordering and performing treatments and interventions, ordering and review of laboratory studies, ordering and review of radiographic studies, pulse oximetry and re-evaluation of patient's condition.  ED Course  I have reviewed the triage vital signs and the nursing notes.  Pertinent labs & imaging results that were available during my care of the patient were reviewed by me and considered in my medical decision making (see chart for details).  I suspect infected DVT of the LLE, CTA PE ordered to exclude PE.    Final Clinical Impression(s) / ED Diagnoses Final diagnoses:  Cellulitis of toe of left foot  Wound infection  COPD exacerbation (HCC)    Admit to medicine    Reba Hulett, MD 04/25/20 0440    Marcelle Hepner,  MD 04/25/20 [phone]

## 2020-04-25 NOTE — Consult Note (Addendum)
Name: Holly Stevenson MRN: [id number] DOB: July 23, 1950    ADMISSION DATE:  04/24/2020 CONSULTATION DATE: 04/24/2020  REFERRING MD : [facility]  CHIEF COMPLAINT: Left foot knee pain  BRIEF PATIENT DESCRIPTION:  69 year old female whose chief complaint is left lower extremity pain  SIGNIFICANT EVENTS    STUDIES:  CT of the chest is noted 2D echo ordered 11/8>> QuantiFERON gold>>   HISTORY OF PRESENT ILLNESS:   69 year old female with a known history of diabetes mellitus on oral agents, current smoker, hypertension and high cholesterol who recently had her left fourth toe nail removed per primary care physician for being loose. She presents to the emergency room with left foot pain and left knee pain that leaves her incapable of walking. She had a CT of the chest which revealed left upper lobe cavitary lesion debris in the bronchus. She is currently being worked up for tuberculosis with QuantiFERON gold. Pulmonary critical care is been asked to evaluate for cavitary lesion.  PAST MEDICAL HISTORY :   has a past medical history of Diabetes mellitus without complication (HCC), High cholesterol, and Hypertension.  has no past surgical history on file. Prior to Admission medications   Medication Sig Start Date End Date Taking? Authorizing Provider  atorvastatin (LIPITOR) 20 MG tablet Take 20 mg by mouth daily. 02/11/20  Yes [provider]  benazepril (LOTENSIN) 10 MG tablet Take 10 mg by mouth daily. 02/11/20  Yes [provider]  cholecalciferol (VITAMIN D3) 25 MCG (1000 UNIT) tablet Take 1,000 Units by mouth daily.   Yes [provider]  JARDIANCE 25 MG TABS tablet Take 25 mg by mouth daily. 04/16/20  Yes [provider]  metFORMIN (GLUCOPHAGE) 500 MG tablet Take 500 mg by mouth 2 (two) times daily. 02/23/20  Yes [provider]  Multiple Vitamins-Minerals (MULTIVITAMIN WITH MINERALS) tablet Take 1 tablet by mouth daily.   Yes [provider]   No Known Allergies  FAMILY HISTORY:  family history is not on file. SOCIAL HISTORY:  reports that she has been smoking. She does not have any smokeless tobacco history on file. She reports that she does not drink alcohol and does not use drugs.  REVIEW OF SYSTEMS:   10 point review of system taken, please see HPI for positives and negatives.   SUBJECTIVE:  69 year old female in no acute distress. VITAL SIGNS: Temp:  [98.1 F (36.7 C)-98.6 F (37 C)] 98.1 F (36.7 C) (11/09 0800) Pulse Rate:  [60-81] 72 (11/09 0800) Resp:  [17-22] 19 (11/09 0800) BP: (113-182)/(51-116) 138/78 (11/09 0800) SpO2:  [91 %-97 %] 95 % (11/09 0800) Weight:  [79.4 kg] 79.4 kg (11/09 0519)  PHYSICAL EXAMINATION: General: Elderly female in no acute distress at rest only complaints are left foot pain and left knee pain Neuro: Grossly intact without focal defect HEENT:   No JVD or lymphadenopathy is appreciated Cardiovascular: Heart sounds are regular regular rate rhythm Lungs: Mild rhonchi Abdomen: Soft with positive bowel sounds Musculoskeletal: Left foot fourth toe obvious toenail has been removed with necrotic area. Questionable red streaks going up the leg left knee is hot tender to touch Skin: Warm and dry except for left foot left leg  Recent Labs  Lab 04/25/20 0357  NA 136  K 4.1  CL 101  BUN 46*  CREATININE 1.20*  GLUCOSE 213*   Recent Labs  Lab 04/25/20 0345 04/25/20 0357  HGB 15.0 15.6*  HCT 45.8 46.0  WBC 10.0  --   PLT 228  --  DG Tibia/Fibula Left  Result Date: 04/25/2020 CLINICAL DATA:  Pain EXAM: LEFT TIBIA AND FIBULA - 2 VIEW COMPARISON:  None. FINDINGS: There is no evidence of fracture or other focal bone lesions. Soft tissues are unremarkable. IMPRESSION: Negative. Electronically Signed   By: Katherine Mantle M.D.   On: 04/25/2020 03:48   CT Angio Chest PE W and/or Wo Contrast  Result Date: 04/25/2020 CLINICAL DATA:  Chest pain or shortness of  breath with pleurisy or effusion suspected. EXAM: CT ANGIOGRAPHY CHEST WITH CONTRAST TECHNIQUE: Multidetector CT imaging of the chest was performed using the standard protocol during bolus administration of intravenous contrast. Multiplanar CT image reconstructions and MIPs were obtained to evaluate the vascular anatomy. CONTRAST:  89mL OMNIPAQUE IOHEXOL 350 MG/ML SOLN COMPARISON:  None. FINDINGS: Cardiovascular: Satisfactory opacification of the pulmonary arteries to the segmental level. No evidence of pulmonary embolism. Normal heart size. No pericardial effusion. Diffuse atheromatous calcification of the aorta and great vessels. Coronary calcifications on the left and right. Mediastinum/Nodes: Negative for adenopathy or mass. Lungs/Pleura: Cavitary lesion in the right upper lobe with irregular wall, 2.4 x 4.5 cm on coronal reformats. There is regional nodularity which is likely endobronchial spread of debris. This could be malignancy or atypical infection, including tuberculosis. The finding is known given it has already been charted in epic. Non infectious granulomatous disease is considered less likely given the solitary lesion. There are a few areas of mucoid impaction superimposed on generalized airway thickening. Centrilobular emphysema Upper Abdomen: Negative Musculoskeletal: Spondylosis.  No acute or aggressive finding. Review of the MIP images confirms the above findings. IMPRESSION: 1. Large cavitary lesion in the right upper lobe, likely either atypical infection (including tuberculosis) or malignancy. 2. Right upper lobe airspace disease that is probably endobronchial spread of debris/infection. 3. Negative for pulmonary embolism. 4. Aortic Atherosclerosis (ICD10-I70.0) and Emphysema (ICD10-J43.9). Electronically Signed   By: Marnee Spring M.D.   On: 04/25/2020 05:03   DG Foot Complete Left  Result Date: 04/25/2020 CLINICAL DATA:  Pain in the fourth digit. EXAM: LEFT FOOT - COMPLETE 3+ VIEW  COMPARISON:  None. FINDINGS: There are advanced degenerative changes of the first metatarsophalangeal joint. There is no definite acute displaced fracture or dislocation. There is no radiographic evidence for osteomyelitis. There is soft tissue swelling about the fourth digit. IMPRESSION: 1. No acute displaced fracture or dislocation. 2. Soft tissue swelling about the fourth digit. 3. Advanced degenerative changes of the first metatarsophalangeal joint. Electronically Signed   By: Katherine Mantle M.D.   On: 04/25/2020 03:49    ASSESSMENT: Principal Problem:    Cavitary lesion of lung left upper lobe   Diabetic infection of left foot (HCC)? bacteremia Active Problems:   DM2 (diabetes mellitus, type 2) (HCC)   CKD (chronic kidney disease) stage 3, GFR 30-59 ml/min (HCC)   HTN (hypertension)  Discussion: 69 year old female, lifelong smoker, but no diabetes mellitus with poor control only on oral agents. She denies fevers chills sputum production shortness of breath. She presented to [facility] emergency department ostensibly for left foot pain left knee pain. She recently had her fourth toe nail removed on the left foot. She has obvious infection and questionable bacteremia on her left foot and now has left knee pain the left knee that is hot swollen and painful. She had a CT of the chest which was negative for PE but did demonstrate a left upper lobe cavitary lesion with debris in the mainstem bronchus. Pulmonary critical care has been called to evaluate.  She was examined found to be in no acute distress other than left foot pain and left knee pain. She denies shortness of breath or changes in her pulmonary functions. He does smoke on a daily basis she has been counseled to stop. Her only past medical history is diabetes mellitus, high cholesterol, and hypertension.  In the absence of any cough, fevers, chills, sweats suspect that this is not a lung abscess or focal pneumonia.  More suspicious based  on the clinical history that this represents malignancy.  I explained this to her today.  Discussed the pros and cons of navigational bronchoscopy to achieve a tissue diagnosis.  I recommended that we pursue ENB once her active hospital issues including her lower extremity infection, possible thrombophlebitis (currently on heparin), possible knee joint infection, can be addressed.  We can arrange for navigational bronchoscopy as an outpatient.  She prefers 05/09/2020 and I will work on setting this up   Levy Pupa, MD, PhD 04/25/2020, 4:57 PM [facility] [phone] or if no answer [phone]

## 2020-04-25 NOTE — Procedures (Signed)
Procedure: Left knee aspiration and injection  Indication: Left knee effusion(s)  Surgeon: Charma Igo, PA-C  Assist: None  Anesthesia: Topical refrigerant  EBL: None  Complications: None  Findings: After risks/benefits explained patient desires to undergo procedure. Consent obtained and time out performed. The left knee was sterilely prepped and aspirated. 38ml opaque dark orange fluid obtained. 72ml 0.5% Marcaine instilled. Pt tolerated the procedure well.    Freeman Caldron, PA-C Orthopedic Surgery [phone]

## 2020-04-25 NOTE — ED Notes (Signed)
Lunch Tray Ordered @ 1033. 

## 2020-04-25 NOTE — Progress Notes (Signed)
Pharmacy Antibiotic Note  Holly Stevenson is a 69 y.o. female admitted on 04/24/2020 with diabetic foot infection, knee pain and concern for septic joint.  Pharmacy has been consulted for vancomycin dosing.  Ceftriaxone per MD.    Plan: Vancomycin 500 mg IV in addition to 1000 mg given for 1500mg  loading dose, then 750mg  IV every 12 hours (target vancomycin trough 15-20) Monitor renal function, Cx and clinical progression to narrow Vancomycin trough at steady state  Height: 5\' 7"  (170.2 cm) Weight: 79.4 kg (175 lb) IBW/kg (Calculated) : 61.6  Temp (24hrs), Avg:98.4 F (36.9 C), Min:98.1 F (36.7 C), Max:98.6 F (37 C)  Recent Labs  Lab 04/25/20 0345 04/25/20 0357  WBC 10.0  --   CREATININE  --  1.20*    Estimated Creatinine Clearance: 47.3 mL/min (A) (by C-G formula based on SCr of 1.2 mg/dL (H)).    No Known Allergies  , PharmD Clinical Pharmacist ED Pharmacist Phone # [phone] 04/25/2020 10:23 AM

## 2020-04-25 NOTE — Progress Notes (Signed)
ANTICOAGULATION CONSULT NOTE - Follow Up Consult  Pharmacy Consult for Heparin Indication: R/O DVT  No Known Allergies  Patient Measurements: Height: 5\' 7"  (170.2 cm) Weight: 79.4 kg (175 lb) IBW/kg (Calculated) : 61.6 Heparin Dosing Weight: 75 kg  Vital Signs: Temp: 98.1 F (36.7 C) (11/09 0800) Temp Source: Oral (11/09 0800) BP: 136/39 (11/09 1430) Pulse Rate: 74 (11/09 1500)  Labs: Recent Labs    04/25/20 0345 04/25/20 0357 04/25/20 1444  HGB 15.0 15.6*  --   HCT 45.8 46.0  --   PLT 228  --   --   LABPROT 13.0  --   --   INR 1.0  --   --   HEPARINUNFRC  --   --  0.21*  CREATININE  --  1.20*  --     Estimated Creatinine Clearance: 47.3 mL/min (A) (by C-G formula based on SCr of 1.2 mg/dL (H)).   Medical History: Past Medical History:  Diagnosis Date  . Diabetes mellitus without complication (HCC)   . High cholesterol   . Hypertension     Assessment: 69 yr old female with LLE foot/knee pain, swelling, possible DVT. Pharmacy was consulted to dose IV heparin. Per med rec, pt was on no anticoagulants PTA. Chest CT: negative for PE.  Initial heparin level ~8 hrs after heparin 4000 units IV bolus X 1, followed by heparin infusion at 1200 units/hr was 0.21 units/ml, which is below the goal range for this pt. H/H, platelets stable. Per ED RN, no issues with IV or bleeding.  Goal of Therapy:  Heparin level 0.3-0.7 units/ml Monitor platelets by anticoagulation protocol: Yes   Plan:  Heparin 1100 units IV bolus, then increase heparin infusion to 1350 units/hr Check heparin level in 8 hours Monitor daily heparin level, CBC Monitor for signs/symptoms of bleeding F/U workup, anticoagulation plan  65, PharmD, BCPS, [facility] Clinical Pharmacist 04/25/2020,4:30 PM

## 2020-04-25 NOTE — Progress Notes (Signed)
PROGRESS NOTE  Holly Stevenson  DOB: 05-Sep-1950  PCP: Maurice Small, MD ZOX:[id number]  DOA: 04/24/2020  LOS: 0 days   Chief Complaint  Patient presents with  . Knee Pain   Brief narrative: Holly Stevenson is a 69 y.o. female with medical history significant of DM2, HTN, smoking, HLD.  Pt had toenail on L fourth toe removed a couple of weeks ago.  Area turned black and now pt has progressive pain swelling, warmth involving the fourth and fifth toe.  For last few days, she also has redness and tenderness of left knee.  She was unable to put weight on that side and hence presented to the ED.  In the ED, patient was afebrile and hemodynamically stable.   Blood work showed normal WBC count, creatinine of 1.2.  X-ray of left foot showed soft tissue swelling about the fourth digit, no evidence of fracture or dislocation.  There also is advanced degenerative changes of the first metatarsophalangeal Joint.  CT angio of chest was negative for pulmonary embolism but showed a large cavitary lesion in the right upper lobe, likely either atypical infection (including tuberculosis) or malignancy.  Patient was started on empiric antibiotics IV Zosyn/IV vancomycin for diabetic foot infection as well as empiric heparin for concern for thrombophlebitis of LLE. Admitted to hospitalist service.  Subjective: Patient was seen and examined this morning.  Pleasant elderly Caucasian female.  Lying on bed.  Complaining of pain on the left knee on movement.  No pain at rest. Chart reviewed. No fever, blood pressure elevated up to 160s Sed rate 30, CRP 8.4  Assessment/Plan: Left foot cellulitis Diabetic foot infection -Not in sepsis.  -No evidence of osteomyelitis on x-ray. -Currently on IV Rocephin and IV vancomycin -There is also concern for possible thrombophlebitis of left lower extremity and hence heparin drip was ordered. -Pending DVT scan and ABI.  Rule out septic arthritis -Her  left knee is red and tender on any range of movement. -Orthopedic consult appreciated.  Left knee aspiration was done.  11 mL of opaque dark orange fluid was obtained. -On broad-spectrum antibiotics.  Cavitary lesion of RUL of lung -per CT scan of chest.  Atypical infection (IE TB) vs malignancy -quantiferon TB gold ordered and pending -Empiric airborne precautions -Pending pulmonary consult.  DM2  -A1c 7.3 on 11/9 -Home medicines include Metformin 5 mg twice daily and Jardiance 25 mg daily. -Currently on sliding scale with Accu-Cheks. -Renal function is stable I would resume Jardiance as well. Recent Labs  Lab 04/25/20 0426 04/25/20 0817 04/25/20 1156  GLUCAP 200* 213* 134*   HTN -Cont to monitor blood pressure on benazepril. -IV hydralazine as needed.  CKD stage 3a  -Chronic, stable, and likely baseline, Creat unchanged from 2019  Mobility: Limited ambulation because of left knee pain. Code Status:   Code Status: Full Code  Nutritional status: Body mass index is 27.41 kg/m.     Diet Order            Diet heart healthy/carb modified Room service appropriate? Yes; Fluid consistency: Thin  Diet effective now                 DVT prophylaxis: On heparin drip empirically  Antimicrobials:  IV Rocephin, IV vancomycin Fluid: Not on IV fluid Consultants: Orthopedics, pulmonary Family Communication:  Not at bedside  Status is: Inpatient  Remains inpatient appropriate because -further evaluation, imaging, needs IV antibiotics   Dispo: The patient is from: Home  Anticipated d/c is to: Likely home              Anticipated d/c date is: > 3 days              Patient currently is not medically stable to d/c.       Infusions:  . cefTRIAXone (ROCEPHIN)  IV Stopped (04/25/20 0751)  . heparin 1,200 Units/hr (04/25/20 0651)  . vancomycin      Scheduled Meds: . atorvastatin  20 mg Oral Daily  . benazepril  10 mg Oral Daily  . bupivacaine  10 mL  Infiltration Once  . empagliflozin  25 mg Oral Daily  . insulin aspart  0-9 Units Subcutaneous Q4H  . methylPREDNISolone acetate  40 mg Intra-articular Once    Antimicrobials: Anti-infectives (From admission, onward)   Start     Dose/Rate Route Frequency Ordered Stop   04/25/20 2300  vancomycin (VANCOREADY) IVPB 750 mg/150 mL        750 mg 150 mL/hr over 60 Minutes Intravenous Every 12 hours 04/25/20 1025     04/25/20 1030  vancomycin (VANCOREADY) IVPB 500 mg/100 mL        500 mg 100 mL/hr over 60 Minutes Intravenous  Once 04/25/20 1025 04/25/20 1315   04/25/20 0600  cefTRIAXone (ROCEPHIN) 2 g in sodium chloride 0.9 % 100 mL IVPB       "And" Linked Group Details   2 g 200 mL/hr over 30 Minutes Intravenous Daily 04/25/20 0439     04/25/20 0600  metroNIDAZOLE (FLAGYL) tablet 500 mg  Status:  Discontinued       "And" Linked Group Details   500 mg Oral Every 8 hours 04/25/20 0439 04/25/20 1020   04/25/20 0415  vancomycin (VANCOCIN) IVPB 1000 mg/200 mL premix        1,000 mg 200 mL/hr over 60 Minutes Intravenous  Once 04/25/20 0403 04/25/20 0632   04/25/20 0415  piperacillin-tazobactam (ZOSYN) IVPB 3.375 g  Status:  Discontinued        3.375 g 100 mL/hr over 30 Minutes Intravenous  Once 04/25/20 0403 04/25/20 0445      PRN meds: acetaminophen **OR** acetaminophen, ondansetron **OR** ondansetron (ZOFRAN) IV   Objective: Vitals:   04/25/20 1230 04/25/20 1330  BP: (!) 136/50 (!) 140/51  Pulse: 66 72  Resp: 17 (!) 21  Temp:    SpO2: 94% 95%    Intake/Output Summary (Last 24 hours) at 04/25/2020 1401 Last data filed at 04/25/2020 1315 Gross per 24 hour  Intake 401.52 ml  Output --  Net 401.52 ml   Filed Weights   04/25/20 0519  Weight: 79.4 kg   Weight change:  Body mass index is 27.41 kg/m.   Physical Exam: General exam: Appears calm and comfortable.  Mild distress because of left knee pain Skin: No rashes, lesions or ulcers. HEENT: Atraumatic, normocephalic, supple  neck, no obvious bleeding Lungs: Clear to auscultation bilaterally  CVS: regular rate and rhythm, no murmur GI/Abd soft, nontender, nondistended, bowel sound present CNS: Alert, awake, oriented x3 Psychiatry: Mood appropriate Extremities: No pedal edema, no calf tenderness, left fourth and fifth toe with cellulitis, left knee redness and tenderness present  Data Review: I have personally reviewed the laboratory data and studies available.  Recent Labs  Lab 04/25/20 0345 04/25/20 0357  WBC 10.0  --   NEUTROABS 7.5  --   HGB 15.0 15.6*  HCT 45.8 46.0  MCV 94.4  --   PLT 228  --  Recent Labs  Lab 04/25/20 0357  NA 136  K 4.1  CL 101  GLUCOSE 213*  BUN 46*  CREATININE 1.20*    F/u labs ordered.  Signed, Lorin Glass, MD [facility] 04/25/2020

## 2020-04-25 NOTE — Consult Note (Addendum)
Reason for Consult:Foot infection Referring Physician: B Dahal  Holly Stevenson is an 69 y.o. female.  HPI: Holly Stevenson comes in with foot and knee pain on the left side. About 4-6 weeks ago she developed an issue with the 4th toe and had the toenail removed. She had been doing well until last week when she began to have redness and pain. This progressed and moved to her knee as well. She began to not be able to put weight down on it on Sunday. She denies fevers, chills, sweats, N/V, prior e/o, or hx/o gout.  Past Medical History:  Diagnosis Date   Diabetes mellitus without complication (HCC)    High cholesterol    Hypertension     History reviewed. No pertinent surgical history.  Family History  Problem Relation Age of Onset   Peripheral Artery Disease Neg Hx     Social History:  reports that she has been smoking. She does not have any smokeless tobacco history on file. She reports that she does not drink alcohol and does not use drugs.  Allergies: No Known Allergies  Medications: I have reviewed the patient's current medications.  Results for orders placed or performed during the hospital encounter of 04/24/20 (from the past 48 hour(s))  CBC with Differential/Platelet     Status: Abnormal   Collection Time: 04/25/20  3:45 AM  Result Value Ref Range   WBC 10.0 4.0 - 10.5 K/uL   RBC 4.85 3.87 - 5.11 MIL/uL   Hemoglobin 15.0 12.0 - 15.0 g/dL   HCT 85.2 36 - 46 %   MCV 94.4 80.0 - 100.0 fL   MCH 30.9 26.0 - 34.0 pg   MCHC 32.8 30.0 - 36.0 g/dL   RDW 77.8 24.2 - 35.3 %   Platelets 228 150 - 400 K/uL   nRBC 0.0 0.0 - 0.2 %   Neutrophils Relative % 75 %   Neutro Abs 7.5 1.7 - 7.7 K/uL   Lymphocytes Relative 13 %   Lymphs Abs 1.3 0.7 - 4.0 K/uL   Monocytes Relative 11 %   Monocytes Absolute 1.1 (H) 0.1 - 1.0 K/uL   Eosinophils Relative 1 %   Eosinophils Absolute 0.1 0.0 - 0.5 K/uL   Basophils Relative 0 %   Basophils Absolute 0.0 0.0 - 0.1 K/uL   Immature  Granulocytes 0 %   Abs Immature Granulocytes 0.03 0.00 - 0.07 K/uL    Comment: Performed at [facility], [address] [place], [place] [address]  Protime-INR     Status: None   Collection Time: 04/25/20  3:45 AM  Result Value Ref Range   Prothrombin Time 13.0 11.4 - 15.2 seconds   INR 1.0 0.8 - 1.2    Comment: (NOTE) INR goal varies based on device and disease states. Performed at [facility], [address] [place], [place] [address]   Respiratory Panel by RT PCR (Flu A&B, Covid) - Nasopharyngeal Swab     Status: None   Collection Time: 04/25/20  3:45 AM   Specimen: Nasopharyngeal Swab  Result Value Ref Range   SARS Coronavirus 2 by RT PCR NEGATIVE NEGATIVE    Comment: (NOTE) SARS-CoV-2 target nucleic acids are NOT DETECTED.  The SARS-CoV-2 RNA is generally detectable in upper respiratoy specimens during the acute phase of infection. The lowest concentration of SARS-CoV-2 viral copies this assay can detect is 131 copies/mL. A negative result does not preclude SARS-Cov-2 infection and should not be used as the sole basis  for treatment or other patient management decisions. A negative result may occur with  improper specimen collection/handling, submission of specimen other than nasopharyngeal swab, presence of viral mutation(s) within the areas targeted by this assay, and inadequate number of viral copies (<131 copies/mL). A negative result must be combined with clinical observations, patient history, and epidemiological information. The expected result is Negative.  Fact Sheet for Patients:  https://www.moore.com/https://www.fda.gov/media/142436/download  Fact Sheet for Healthcare Providers:  https://www.young.biz/https://www.fda.gov/media/142435/download  This test is no t yet approved or cleared by the [place] FDA and  has been authorized for detection and/or diagnosis of SARS-CoV-2 by FDA under an Emergency Use Authorization (EUA). This EUA will remain  in effect (meaning this test can be  used) for the duration of the COVID-19 declaration under Section 564(b)(1) of the Act, 21 U.S.C. section 360bbb-3(b)(1), unless the authorization is terminated or revoked sooner.     Influenza A by PCR NEGATIVE NEGATIVE   Influenza B by PCR NEGATIVE NEGATIVE    Comment: (NOTE) The Xpert Xpress SARS-CoV-2/FLU/RSV assay is intended as an aid in  the diagnosis of influenza from Nasopharyngeal swab specimens and  should not be used as a sole basis for treatment. Nasal washings and  aspirates are unacceptable for Xpert Xpress SARS-CoV-2/FLU/RSV  testing.  Fact Sheet for Patients: https://www.moore.com/https://www.fda.gov/media/142436/download  Fact Sheet for Healthcare Providers: https://www.young.biz/https://www.fda.gov/media/142435/download  This test is not yet approved or cleared by the [place] FDA and  has been authorized for detection and/or diagnosis of SARS-CoV-2 by  FDA under an Emergency Use Authorization (EUA). This EUA will remain  in effect (meaning this test can be used) for the duration of the  Covid-19 declaration under Section 564(b)(1) of the Act, 21  U.S.C. section 360bbb-3(b)(1), unless the authorization is  terminated or revoked. Performed at [facility], [address] [place], [place] [address]   I-stat chem 8, ED (not at [facility] or [facility])     Status: Abnormal   Collection Time: 04/25/20  3:57 AM  Result Value Ref Range   Sodium 136 135 - 145 mmol/L   Potassium 4.1 3.5 - 5.1 mmol/L   Chloride 101 98 - 111 mmol/L   BUN 46 (H) 8 - 23 mg/dL   Creatinine, Ser 6.041.20 (H) 0.44 - 1.00 mg/dL   Glucose, Bld 540213 (H) 70 - 99 mg/dL    Comment: Glucose reference range applies only to samples taken after fasting for at least 8 hours.   Calcium, Ion 1.12 (L) 1.15 - 1.40 mmol/L   TCO2 23 22 - 32 mmol/L   Hemoglobin 15.6 (H) 12.0 - 15.0 g/dL   HCT 98.146.0 36 - 46 %  CBG monitoring, ED     Status: Abnormal   Collection Time: 04/25/20  4:26 AM  Result Value Ref Range   Glucose-Capillary 200 (H) 70 - 99 mg/dL     Comment: Glucose reference range applies only to samples taken after fasting for at least 8 hours.   Comment 1 Notify RN    Comment 2 Document in Chart   HIV Antibody (routine testing w rflx)     Status: None   Collection Time: 04/25/20  5:44 AM  Result Value Ref Range   HIV Screen 4th Generation wRfx Non Reactive Non Reactive    Comment: Performed at [facility], [address] [place], [place] [address]  Sedimentation rate     Status: Abnormal   Collection Time: 04/25/20  5:44 AM  Result Value Ref Range  Sed Rate 30 (H) 0 - 22 mm/hr    Comment: Performed at [facility], [address], [place], [place] [address]  C-reactive protein     Status: Abnormal   Collection Time: 04/25/20  5:44 AM  Result Value Ref Range   CRP 8.4 (H) <1.0 mg/dL    Comment: Performed at [facility], [address], [place], [place] [address]  Prealbumin     Status: Abnormal   Collection Time: 04/25/20  5:44 AM  Result Value Ref Range   Prealbumin 17.3 (L) 18 - 38 mg/dL    Comment: Performed at [facility], [address] [place], [place] [address]  Hemoglobin A1c     Status: Abnormal   Collection Time: 04/25/20  5:44 AM  Result Value Ref Range   Hgb A1c MFr Bld 7.3 (H) 4.8 - 5.6 %    Comment: (NOTE) Pre diabetes:          5.7%-6.4%  Diabetes:              >6.4%  Glycemic control for   <7.0% adults with diabetes    Mean Plasma Glucose 162.81 mg/dL    Comment: Performed at [facility], [address] [place], [place] [address]  CBG monitoring, ED     Status: Abnormal   Collection Time: 04/25/20  8:17 AM  Result Value Ref Range   Glucose-Capillary 213 (H) 70 - 99 mg/dL    Comment: Glucose reference range applies only to samples taken after fasting for at least 8 hours.    DG Tibia/Fibula Left  Result Date: 04/25/2020 CLINICAL DATA:  Pain EXAM: LEFT TIBIA AND FIBULA - 2 VIEW COMPARISON:  None. FINDINGS: There is no evidence of fracture or other focal bone  lesions. Soft tissues are unremarkable. IMPRESSION: Negative. Electronically Signed   By: Katherine Mantle M.D.   On: 04/25/2020 03:48   CT Angio Chest PE W and/or Wo Contrast  Result Date: 04/25/2020 CLINICAL DATA:  Chest pain or shortness of breath with pleurisy or effusion suspected. EXAM: CT ANGIOGRAPHY CHEST WITH CONTRAST TECHNIQUE: Multidetector CT imaging of the chest was performed using the standard protocol during bolus administration of intravenous contrast. Multiplanar CT image reconstructions and MIPs were obtained to evaluate the vascular anatomy. CONTRAST:  16mL OMNIPAQUE IOHEXOL 350 MG/ML SOLN COMPARISON:  None. FINDINGS: Cardiovascular: Satisfactory opacification of the pulmonary arteries to the segmental level. No evidence of pulmonary embolism. Normal heart size. No pericardial effusion. Diffuse atheromatous calcification of the aorta and great vessels. Coronary calcifications on the left and right. Mediastinum/Nodes: Negative for adenopathy or mass. Lungs/Pleura: Cavitary lesion in the right upper lobe with irregular wall, 2.4 x 4.5 cm on coronal reformats. There is regional nodularity which is likely endobronchial spread of debris. This could be malignancy or atypical infection, including tuberculosis. The finding is known given it has already been charted in epic. Non infectious granulomatous disease is considered less likely given the solitary lesion. There are a few areas of mucoid impaction superimposed on generalized airway thickening. Centrilobular emphysema Upper Abdomen: Negative Musculoskeletal: Spondylosis.  No acute or aggressive finding. Review of the MIP images confirms the above findings. IMPRESSION: 1. Large cavitary lesion in the right upper lobe, likely either atypical infection (including tuberculosis) or malignancy. 2. Right upper lobe airspace disease that is probably endobronchial spread of debris/infection. 3. Negative for pulmonary embolism. 4. Aortic Atherosclerosis  (ICD10-I70.0) and Emphysema (ICD10-J43.9). Electronically Signed   By: Marnee Spring  M.D.   On: 04/25/2020 05:03   DG Foot Complete Left  Result Date: 04/25/2020 CLINICAL DATA:  Pain in the fourth digit. EXAM: LEFT FOOT - COMPLETE 3+ VIEW COMPARISON:  None. FINDINGS: There are advanced degenerative changes of the first metatarsophalangeal joint. There is no definite acute displaced fracture or dislocation. There is no radiographic evidence for osteomyelitis. There is soft tissue swelling about the fourth digit. IMPRESSION: 1. No acute displaced fracture or dislocation. 2. Soft tissue swelling about the fourth digit. 3. Advanced degenerative changes of the first metatarsophalangeal joint. Electronically Signed   By: Katherine Mantle M.D.   On: 04/25/2020 03:49    Review of Systems  Constitutional: Negative for chills, diaphoresis and fever.  HENT: Negative for ear discharge, ear pain, hearing loss and tinnitus.   Eyes: Negative for photophobia and pain.  Respiratory: Negative for cough and shortness of breath.   Cardiovascular: Negative for chest pain.  Gastrointestinal: Negative for abdominal pain, nausea and vomiting.  Genitourinary: Negative for dysuria, flank pain, frequency and urgency.  Musculoskeletal: Positive for arthralgias (Left knee>left foot). Negative for back pain, myalgias and neck pain.  Neurological: Negative for dizziness and headaches.  Hematological: Does not bruise/bleed easily.  Psychiatric/Behavioral: The patient is not nervous/anxious.    Blood pressure (!) 176/55, pulse 69, temperature 98.1 F (36.7 C), temperature source Oral, resp. rate (!) 23, height 5\' 7"  (1.702 m), weight 79.4 kg, SpO2 94 %. Physical Exam Constitutional:      General: She is not in acute distress.    Appearance: She is well-developed. She is not diaphoretic.  HENT:     Head: Normocephalic and atraumatic.  Eyes:     General: No scleral icterus.       Right eye: No discharge.        Left  eye: No discharge.     Conjunctiva/sclera: Conjunctivae normal.  Cardiovascular:     Rate and Rhythm: Normal rate and regular rhythm.  Pulmonary:     Effort: Pulmonary effort is normal. No respiratory distress.  Musculoskeletal:     Cervical back: Normal range of motion.     Comments: LLE No traumatic wounds, ecchymosis, or rash  Black eschar in place of 4th toenail, minimal TTP 4th toe, erythema at 5th MTP and less at 1st MTP, no warmth or TTP.  Mild knee effusion, severe pain with AROM/PROM, motion limited to ~150-175  Sens DPN, SPN, TN paresthetic  Motor EHL, ext, flex, evers 5/5  DP 0, PT 0, No significant edema  Skin:    General: Skin is warm and dry.  Neurological:     Mental Status: She is alert.  Psychiatric:        Behavior: Behavior normal.     Assessment/Plan: Left knee/foot pain -- Will tap left knee to r/o septic arthritis. Suspicious for gout. Agree with ABI's, will need vascular consult if abnormal. Dr. to evaluate in AM. Multiple medical problems including DM, HTN, HLD, and tobacco use -- per primary service    Lajoyce Corners, PA-C Orthopedic Surgery [phone] 04/25/2020, 10:43 AM   Addendum:  The patient is seen and examined in the ER.  I agree with the findings documented above.  The knee aspirate reveals pyarthrosis.  She will be NPO after midnight for I and D of the L knee knee tomorrow with Dr. 13/02/2020.  She understands the plan and agrees.

## 2020-04-26 ENCOUNTER — Inpatient Hospital Stay (HOSPITAL_COMMUNITY): Payer: BC Managed Care – PPO

## 2020-04-26 ENCOUNTER — Other Ambulatory Visit: Payer: Self-pay | Admitting: Physician Assistant

## 2020-04-26 ENCOUNTER — Encounter (HOSPITAL_COMMUNITY)
Admission: EM | Disposition: A | Discharge status: Home / Self Care | Payer: Self-pay | Source: Home / Self Care | Attending: Family Medicine

## 2020-04-26 DIAGNOSIS — J984 Other disorders of lung: Secondary | ICD-10-CM | POA: Diagnosis not present

## 2020-04-26 DIAGNOSIS — I96 Gangrene, not elsewhere classified: Secondary | ICD-10-CM

## 2020-04-26 DIAGNOSIS — R52 Pain, unspecified: Secondary | ICD-10-CM

## 2020-04-26 DIAGNOSIS — I739 Peripheral vascular disease, unspecified: Secondary | ICD-10-CM

## 2020-04-26 DIAGNOSIS — M1A0620 Idiopathic chronic gout, left knee, without tophus (tophi): Secondary | ICD-10-CM | POA: Diagnosis not present

## 2020-04-26 DIAGNOSIS — L97519 Non-pressure chronic ulcer of other part of right foot with unspecified severity: Secondary | ICD-10-CM

## 2020-04-26 DIAGNOSIS — L03032 Cellulitis of left toe: Secondary | ICD-10-CM | POA: Diagnosis not present

## 2020-04-26 DIAGNOSIS — L089 Local infection of the skin and subcutaneous tissue, unspecified: Secondary | ICD-10-CM | POA: Diagnosis not present

## 2020-04-26 DIAGNOSIS — E11628 Type 2 diabetes mellitus with other skin complications: Secondary | ICD-10-CM | POA: Diagnosis not present

## 2020-04-26 LAB — GLUCOSE, CAPILLARY
Glucose-Capillary: 103 mg/dL — ABNORMAL HIGH (ref 70–99)
Glucose-Capillary: 127 mg/dL — ABNORMAL HIGH (ref 70–99)
Glucose-Capillary: 148 mg/dL — ABNORMAL HIGH (ref 70–99)
Glucose-Capillary: 157 mg/dL — ABNORMAL HIGH (ref 70–99)
Glucose-Capillary: 181 mg/dL — ABNORMAL HIGH (ref 70–99)
Glucose-Capillary: 196 mg/dL — ABNORMAL HIGH (ref 70–99)

## 2020-04-26 LAB — HEPARIN LEVEL (UNFRACTIONATED)
Heparin Unfractionated: <0.1 IU/mL — ABNORMAL LOW (ref 0.30–0.70)
Heparin Unfractionated: <0.1 IU/mL — ABNORMAL LOW (ref 0.30–0.70)

## 2020-04-26 LAB — CBC
HCT: 38.7 % (ref 36.0–46.0)
Hemoglobin: 12.9 g/dL (ref 12.0–15.0)
MCH: 31.2 pg (ref 26.0–34.0)
MCHC: 33.3 g/dL (ref 30.0–36.0)
MCV: 93.7 fL (ref 80.0–100.0)
Platelets: 181 10*3/uL (ref 150–400)
RBC: 4.13 MIL/uL (ref 3.87–5.11)
RDW: 12.7 % (ref 11.5–15.5)
WBC: 8.5 10*3/uL (ref 4.0–10.5)
nRBC: 0 % (ref 0.0–0.2)

## 2020-04-26 LAB — BASIC METABOLIC PANEL
Anion gap: 11 (ref 5–15)
BUN: 31 mg/dL — ABNORMAL HIGH (ref 8–23)
CO2: 23 mmol/L (ref 22–32)
Calcium: 8.8 mg/dL — ABNORMAL LOW (ref 8.9–10.3)
Chloride: 102 mmol/L (ref 98–111)
Creatinine, Ser: 0.81 mg/dL (ref 0.44–1.00)
GFR, Estimated: >60 mL/min (ref >60)
Glucose, Bld: 117 mg/dL — ABNORMAL HIGH (ref 70–99)
Potassium: 3.4 mmol/L — ABNORMAL LOW (ref 3.5–5.1)
Sodium: 136 mmol/L (ref 135–145)

## 2020-04-26 LAB — SURGICAL PCR SCREEN
MRSA, PCR: NEGATIVE
Staphylococcus aureus: NEGATIVE

## 2020-04-26 LAB — URIC ACID: Uric Acid, Serum: 7.7 mg/dL — ABNORMAL HIGH (ref 2.5–7.1)

## 2020-04-26 SURGERY — ARTHROSCOPY, KNEE
Anesthesia: General | Site: Knee | Laterality: Left

## 2020-04-26 MED ORDER — GLUCERNA SHAKE PO LIQD
237.0000 mL | Freq: Three times a day (TID) | ORAL | Status: DC
  Administered 2020-04-26 – 2020-04-29 (×6): 237 mL via ORAL

## 2020-04-26 MED ORDER — ENOXAPARIN SODIUM 40 MG/0.4ML ~~LOC~~ SOLN
40.0000 mg | SUBCUTANEOUS | Status: DC
  Administered 2020-04-26 – 2020-04-28 (×3): 40 mg via SUBCUTANEOUS
  Filled 2020-04-26 (×3): qty 0.4

## 2020-04-26 MED ORDER — ADULT MULTIVITAMIN W/MINERALS CH
1.0000 | ORAL_TABLET | Freq: Every day | ORAL | Status: DC
  Administered 2020-04-26 – 2020-04-29 (×4): 1 via ORAL
  Filled 2020-04-26 (×4): qty 1

## 2020-04-26 MED ORDER — SODIUM CHLORIDE 0.9 % IV SOLN
INTRAVENOUS | Status: DC | PRN
  Administered 2020-04-26: 250 mL via INTRAVENOUS

## 2020-04-26 MED ORDER — COLCHICINE 0.6 MG PO TABS
0.6000 mg | ORAL_TABLET | Freq: Every day | ORAL | Status: DC
  Administered 2020-04-26 – 2020-04-29 (×4): 0.6 mg via ORAL
  Filled 2020-04-26 (×4): qty 1

## 2020-04-26 NOTE — Progress Notes (Signed)
ANTICOAGULATION CONSULT NOTE - Follow Up Consult  Pharmacy Consult for Heparin Indication: R/O DVT  No Known Allergies  Patient Measurements: Height: 5\' 7"  (170.2 cm) Weight: 79.4 kg (175 lb) IBW/kg (Calculated) : 61.6 Heparin Dosing Weight: 75 kg  Vital Signs: Temp: 98.9 F (37.2 C) (11/10 0300) Temp Source: Oral (11/10 0300) BP: 129/38 (11/10 0300) Pulse Rate: 65 (11/10 0300)  Labs: Recent Labs    04/25/20 0345 04/25/20 0345 04/25/20 0357 04/25/20 1444 04/26/20 0152  HGB 15.0   < > 15.6*  --  12.9  HCT 45.8  --  46.0  --  38.7  PLT 228  --   --   --  181  LABPROT 13.0  --   --   --   --   INR 1.0  --   --   --   --   HEPARINUNFRC  --   --   --  0.21* <0.10*  CREATININE  --   --  1.20*  --  0.81   < > = values in this interval not displayed.    Estimated Creatinine Clearance: 70.1 mL/min (by C-G formula based on SCr of 0.81 mg/dL).   Medical History: Past Medical History:  Diagnosis Date  . Diabetes mellitus without complication (HCC)   . High cholesterol   . Hypertension     Assessment: 69 yr old female with LLE foot/knee pain, swelling, possible DVT. Pharmacy was consulted to dose IV heparin. Per med rec, pt was on no anticoagulants PTA. Chest CT: negative for PE.  Initial heparin level ~8 hrs after heparin 4000 units IV bolus X 1, followed by heparin infusion at 1200 units/hr was 0.21 units/ml, which is below the goal range for this pt. H/H, platelets stable. Per ED RN, no issues with IV or bleeding.  11/10 AM update:  Heparin level low Infusing good per RN  Goal of Therapy:  Heparin level 0.3-0.7 units/ml Monitor platelets by anticoagulation protocol: Yes   Plan:  Inc heparin to 1500 units/hr 1400 heparin level  13/10, PharmD, BCPS Clinical Pharmacist Phone: [phone]

## 2020-04-26 NOTE — Consult Note (Signed)
ORTHOPAEDIC CONSULTATION  REQUESTING PHYSICIAN: Standley Brooking, MD  Chief Complaint: Redness around left knee and ischemic fourth toe on the left foot  HPI: Vonita Stevenson is a 69 y.o. female who presents with diabetic neuropathy history of tobacco use with numbness in both feet and redness around the left knee.  She did have her knee aspirated.  Past Medical History:  Diagnosis Date  . Diabetes mellitus without complication (HCC)   . High cholesterol   . Hypertension    History reviewed. No pertinent surgical history. Social History   Socioeconomic History  . Marital status: Married    Spouse name: Not on file  . Number of children: Not on file  . Years of education: Not on file  . Highest education level: Not on file  Occupational History  . Not on file  Tobacco Use  . Smoking status: Current Every Day Smoker  Substance and Sexual Activity  . Alcohol use: Never  . Drug use: Never  . Sexual activity: Not on file  Other Topics Concern  . Not on file  Social History Narrative  . Not on file   Social Determinants of Health   Financial Resource Strain:   . Difficulty of Paying Living Expenses: Not on file  Food Insecurity:   . Worried About Programme researcher, broadcasting/film/video in the Last Year: Not on file  . Ran Out of Food in the Last Year: Not on file  Transportation Needs:   . Lack of Transportation (Medical): Not on file  . Lack of Transportation (Non-Medical): Not on file  Physical Activity:   . Days of Exercise per Week: Not on file  . Minutes of Exercise per Session: Not on file  Stress:   . Feeling of Stress : Not on file  Social Connections:   . Frequency of Communication with Friends and Family: Not on file  . Frequency of Social Gatherings with Friends and Family: Not on file  . Attends Religious Services: Not on file  . Active Member of Clubs or Organizations: Not on file  . Attends Banker Meetings: Not on file  . Marital Status: Not  on file   Family History  Problem Relation Age of Onset  . Peripheral Artery Disease Neg Hx    - negative except otherwise stated in the family history section No Known Allergies Prior to Admission medications   Medication Sig Start Date End Date Taking? Authorizing Provider  atorvastatin (LIPITOR) 20 MG tablet Take 20 mg by mouth daily. 02/11/20  Yes [provider]  benazepril (LOTENSIN) 10 MG tablet Take 10 mg by mouth daily. 02/11/20  Yes [provider]  cholecalciferol (VITAMIN D3) 25 MCG (1000 UNIT) tablet Take 1,000 Units by mouth daily.   Yes [provider]  JARDIANCE 25 MG TABS tablet Take 25 mg by mouth daily. 04/16/20  Yes [provider]  metFORMIN (GLUCOPHAGE) 500 MG tablet Take 500 mg by mouth 2 (two) times daily. 02/23/20  Yes [provider]  Multiple Vitamins-Minerals (MULTIVITAMIN WITH MINERALS) tablet Take 1 tablet by mouth daily.   Yes [provider]   DG Tibia/Fibula Left  Result Date: 04/25/2020 CLINICAL DATA:  Pain EXAM: LEFT TIBIA AND FIBULA - 2 VIEW COMPARISON:  None. FINDINGS: There is no evidence of fracture or other focal bone lesions. Soft tissues are unremarkable. IMPRESSION: Negative. Electronically Signed   By: Katherine Mantle M.D.   On: 04/25/2020 03:48   CT Angio Chest PE  W and/or Wo Contrast  Result Date: 04/25/2020 CLINICAL DATA:  Chest pain or shortness of breath with pleurisy or effusion suspected. EXAM: CT ANGIOGRAPHY CHEST WITH CONTRAST TECHNIQUE: Multidetector CT imaging of the chest was performed using the standard protocol during bolus administration of intravenous contrast. Multiplanar CT image reconstructions and MIPs were obtained to evaluate the vascular anatomy. CONTRAST:  75mL OMNIPAQUE IOHEXOL 350 MG/ML SOLN COMPARISON:  None. FINDINGS: Cardiovascular: Satisfactory opacification of the pulmonary arteries to the segmental level. No evidence of pulmonary embolism. Normal heart size. No  pericardial effusion. Diffuse atheromatous calcification of the aorta and great vessels. Coronary calcifications on the left and right. Mediastinum/Nodes: Negative for adenopathy or mass. Lungs/Pleura: Cavitary lesion in the right upper lobe with irregular wall, 2.4 x 4.5 cm on coronal reformats. There is regional nodularity which is likely endobronchial spread of debris. This could be malignancy or atypical infection, including tuberculosis. The finding is known given it has already been charted in epic. Non infectious granulomatous disease is considered less likely given the solitary lesion. There are a few areas of mucoid impaction superimposed on generalized airway thickening. Centrilobular emphysema Upper Abdomen: Negative Musculoskeletal: Spondylosis.  No acute or aggressive finding. Review of the MIP images confirms the above findings. IMPRESSION: 1. Large cavitary lesion in the right upper lobe, likely either atypical infection (including tuberculosis) or malignancy. 2. Right upper lobe airspace disease that is probably endobronchial spread of debris/infection. 3. Negative for pulmonary embolism. 4. Aortic Atherosclerosis (ICD10-I70.0) and Emphysema (ICD10-J43.9). Electronically Signed   By: Marnee SpringJonathon  Watts M.D.   On: 04/25/2020 05:03   DG Foot Complete Left  Result Date: 04/25/2020 CLINICAL DATA:  Pain in the fourth digit. EXAM: LEFT FOOT - COMPLETE 3+ VIEW COMPARISON:  None. FINDINGS: There are advanced degenerative changes of the first metatarsophalangeal joint. There is no definite acute displaced fracture or dislocation. There is no radiographic evidence for osteomyelitis. There is soft tissue swelling about the fourth digit. IMPRESSION: 1. No acute displaced fracture or dislocation. 2. Soft tissue swelling about the fourth digit. 3. Advanced degenerative changes of the first metatarsophalangeal joint. Electronically Signed   By: Katherine Mantlehristopher  Green M.D.   On: 04/25/2020 03:49   ECHOCARDIOGRAM  COMPLETE  Result Date: 04/25/2020    ECHOCARDIOGRAM REPORT   Patient Name:   Holly Stevenson Date of Exam: 04/25/2020 Medical Rec #:  [id number]             Height:       67.0 in Accession #:    [id number]            Weight:       175.0 lb Date of Birth:  01/10/1951             BSA:          1.911 m Patient Age:    69 years              BP:           140/51 mmHg Patient Gender: F                     HR:           69 bpm. Exam Location:  Inpatient Procedure: 2D Echo Indications:    Bacteremia R78.81  History:        Patient has no prior history of Echocardiogram examinations.                 Risk Factors:Hypertension and  Diabetes.  Sonographer:    Thurman Coyer RDCS (AE) Referring Phys: [id number] Chrissie Noa S MINOR IMPRESSIONS  1. Left ventricular ejection fraction, by estimation, is 60 to 65%. The left ventricle has normal function. The left ventricle has no regional wall motion abnormalities. Left ventricular diastolic parameters are consistent with Grade I diastolic dysfunction (impaired relaxation).  2. Right ventricular systolic function is normal. The right ventricular size is normal.  3. The mitral valve is normal in structure. No evidence of mitral valve regurgitation. No evidence of mitral stenosis.  4. The aortic valve is normal in structure. Aortic valve regurgitation is not visualized. No aortic stenosis is present.  5. The inferior vena cava is normal in size with greater than 50% respiratory variability, suggesting right atrial pressure of 3 mmHg. Conclusion(s)/Recommendation(s): No evidence of valvular vegetations on this transthoracic echocardiogram. Would recommend a transesophageal echocardiogram to exclude infective endocarditis if clinically indicated. FINDINGS  Left Ventricle: Left ventricular ejection fraction, by estimation, is 60 to 65%. The left ventricle has normal function. The left ventricle has no regional wall motion abnormalities. The left ventricular internal cavity size was normal  in size. There is  no left ventricular hypertrophy. Left ventricular diastolic parameters are consistent with Grade I diastolic dysfunction (impaired relaxation). Indeterminate filling pressures. Right Ventricle: The right ventricular size is normal. No increase in right ventricular wall thickness. Right ventricular systolic function is normal. Left Atrium: Left atrial size was normal in size. Right Atrium: Right atrial size was normal in size. Pericardium: There is no evidence of pericardial effusion. Mitral Valve: The mitral valve is normal in structure. No evidence of mitral valve regurgitation. No evidence of mitral valve stenosis. Tricuspid Valve: The tricuspid valve is normal in structure. Tricuspid valve regurgitation is not demonstrated. No evidence of tricuspid stenosis. Aortic Valve: The aortic valve is normal in structure. Aortic valve regurgitation is not visualized. No aortic stenosis is present. Pulmonic Valve: The pulmonic valve was normal in structure. Pulmonic valve regurgitation is not visualized. No evidence of pulmonic stenosis. Aorta: The aortic root is normal in size and structure. Venous: The inferior vena cava is normal in size with greater than 50% respiratory variability, suggesting right atrial pressure of 3 mmHg. IAS/Shunts: No atrial level shunt detected by color flow Doppler.  LEFT VENTRICLE PLAX 2D LVIDd:         3.60 cm  Diastology LVIDs:         2.30 cm  LV e' medial:    5.22 cm/s LV PW:         0.90 cm  LV E/e' medial:  11.9 LV IVS:        0.90 cm  LV e' lateral:   4.90 cm/s LVOT diam:     2.00 cm  LV E/e' lateral: 12.7 LV SV:         78 LV SV Index:   41 LVOT Area:     3.14 cm  RIGHT VENTRICLE RV S prime:     13.10 cm/s TAPSE (M-mode): 1.2 cm LEFT ATRIUM             Index LA diam:        3.30 cm 1.73 cm/m LA Vol (A2C):   29.8 ml 15.60 ml/m LA Vol (A4C):   20.6 ml 10.78 ml/m LA Biplane Vol: 25.1 ml 13.14 ml/m  AORTIC VALVE LVOT Vmax:   140.00 cm/s LVOT Vmean:  93.100 cm/s LVOT  VTI:    0.248 m  AORTA Ao Root diam: 2.50 cm  MITRAL VALVE MV Area (PHT): 1.93 cm    SHUNTS MV Decel Time: 394 msec    Systemic VTI:  0.25 m MV E velocity: 62.10 cm/s  Systemic Diam: 2.00 cm MV A velocity: 97.50 cm/s MV E/A ratio:  0.64 Mihai Croitoru MD Electronically signed by Thurmon Fair MD Signature Date/Time: 04/25/2020/3:22:05 PM    Final    - pertinent xrays, CT, MRI studies were reviewed and independently interpreted  Positive ROS: All other systems have been reviewed and were otherwise negative with the exception of those mentioned in the HPI and as above.  Physical Exam: General: Alert, no acute distress Psychiatric: Patient is competent for consent with normal mood and affect Lymphatic: No axillary or cervical lymphadenopathy Cardiovascular: No pedal edema Respiratory: No cyanosis, no use of accessory musculature GI: No organomegaly, abdomen is soft and non-tender    Images:  @ENCIMAGES @  Labs:  Lab Results  Component Value Date   HGBA1C 7.3 (H) 04/25/2020   ESRSEDRATE 30 (H) 04/25/2020   CRP 8.4 (H) 04/25/2020   REPTSTATUS PENDING 04/25/2020   GRAMSTAIN  04/25/2020    MODERATE WBC PRESENT, PREDOMINANTLY PMN NO ORGANISMS SEEN Performed at [facility], [address] [place], [place]    CULT PENDING 04/25/2020    Lab Results  Component Value Date   ALBUMIN 4.4 02/18/2018   PREALBUMIN 17.3 (L) 04/25/2020    Neurologic: Patient does not have protective sensation bilateral lower extremities.   MUSCULOSKELETAL:   Skin: Examination there is some mild redness around the left knee there is no induration there is no effusion she has no pain with active or passive range of motion of the left knee.  Semination the left foot she does not have a palpable dorsalis pedis or posterior tibial pulse with the Doppler she has venous circulation over the posterior tibial I cannot Doppler a dorsalis pedis.  She has ischemic changes to the nailbed of the  fourth toe.  Most recent hemoglobin A1c 7.3 her white cell count is 8.5  Aspirate from the left knee showing moderate WBCs with no organisms seen.  Blood cultures are negative x1 day.  Assessment:   Assessment: Redness superficial left knee but no clinical signs of septic knee at this time.  Gangrenous changes to the left fourth toe.  Plan: I have requested consultation for vascular surgery and have requested ankle-brachial indices as well as a uric acid.  Addendum patient's uric acid is 7.7.  Most likely the symptoms are from acute gout I will order colchicine.  Thank you for the consult and the opportunity to see Ms. 13/02/2020, MD [facility] [phone] 7:10 AM

## 2020-04-26 NOTE — Consult Note (Addendum)
[facility] NOTE   MRN : [id number]  Reason for Consult: left foot wound Referring Physician: Dr. Lajoyce Corners  History of Present Illness: 69 y/o female that presents to the hospital with left knee pain and left 4th toe nail plate non healing wound with dry gangrene.  She states her PCP removed the right 4th toe nail a month ago.  She denise history of claudication symptom, rest pain or previous non healing.     She does have DM, hypercholesterolemia, and HTN.       Current Facility-Administered Medications  Medication Dose Route Frequency Provider Last Rate Last Admin  . acetaminophen (TYLENOL) tablet 650 mg  650 mg Oral Q6H PRN Hillary Bow, DO   650 mg at 04/25/20 2239   Or  . acetaminophen (TYLENOL) suppository 650 mg  650 mg Rectal Q6H PRN Hillary Bow, DO      . atorvastatin (LIPITOR) tablet 20 mg  20 mg Oral Daily Lyda Perone M, DO   20 mg at 04/25/20 0934  . benazepril (LOTENSIN) tablet 10 mg  10 mg Oral Daily Lyda Perone M, DO   10 mg at 04/25/20 0934  . bupivacaine (MARCAINE) 0.5 % injection 10 mL  10 mL Infiltration Once Freeman Caldron, PA-C      . cefTRIAXone (ROCEPHIN) 2 g in sodium chloride 0.9 % 100 mL IVPB  2 g Intravenous Daily Hillary Bow, DO   Stopped at 04/25/20 0751  . empagliflozin (JARDIANCE) tablet 25 mg  25 mg Oral Daily Dahal, Binaya, MD   25 mg at 04/25/20 0935  . heparin ADULT infusion 100 units/mL (25000 units/282mL sodium chloride 0.45%)  1,500 Units/hr Intravenous Continuous Stevphen Rochester, RPH 15 mL/hr at 04/26/20 0603 1,500 Units/hr at 04/26/20 0603  . insulin aspart (novoLOG) injection 0-9 Units  0-9 Units Subcutaneous Q4H Hillary Bow, DO   2 Units at 04/26/20 0029  . methylPREDNISolone acetate (DEPO-MEDROL) injection 40 mg  40 mg Intra-articular Once Freeman Caldron, PA-C      . ondansetron [facility]) tablet 4 mg  4 mg Oral Q6H PRN Hillary Bow, DO       Or  . ondansetron [facility])  injection 4 mg  4 mg Intravenous Q6H PRN Hillary Bow, DO      . vancomycin Luna Kitchens) IVPB 750 mg/150 mL  750 mg Intravenous Q12H Daylene Posey, RPH 150 mL/hr at 04/25/20 2233 750 mg at 04/25/20 2233    Pt meds include: Statin :Yes Betablocker: No ASA: No Other anticoagulants/antiplatelets: currently on Heparin  Past Medical History:  Diagnosis Date  . Diabetes mellitus without complication (HCC)   . High cholesterol   . Hypertension     History reviewed. No pertinent surgical history.  Social History Social History   Tobacco Use  . Smoking status: Current Every Day Smoker  Substance Use Topics  . Alcohol use: Never  . Drug use: Never    Family History Family History  Problem Relation Age of Onset  . Peripheral Artery Disease Neg Hx     No Known Allergies   REVIEW OF SYSTEMS  General: [ ]  Weight loss, [ ]  Fever, [ ]  chills Neurologic: [ ]  Dizziness, [ ]  Blackouts, [ ]  Seizure [ ]  Stroke, [ ]  "Mini stroke", [ ]  Slurred speech, [ ]  Temporary blindness; [ ]  weakness in arms or legs, [ ]  Hoarseness [ ]  Dysphagia Cardiac: [ ]  Chest pain/pressure, [ ]  Shortness of breath at rest [ ]   Shortness of breath with exertion, [ ]  Atrial fibrillation or irregular heartbeat  Vascular: [ ]  Pain in legs with walking, [ ]  Pain in legs at rest, [ ]  Pain in legs at night,  [ ]  Non-healing ulcer, [ ]  Blood clot in vein/DVT,   Pulmonary: [ ]  Home oxygen, [ ]  Productive cough, [ ]  Coughing up blood, [ ]  Asthma,  [ ]  Wheezing [ ]  COPD Musculoskeletal:  [ ]  Arthritis, [ ]  Low back pain, [x ] Joint pain Hematologic: [ ]  Easy Bruising, [ ]  Anemia; [ ]  Hepatitis Gastrointestinal: [ ]  Blood in stool, [ ]  Gastroesophageal Reflux/heartburn, Urinary: [ ]  chronic Kidney disease, [ ]  on HD - [ ]  MWF or [ ]  TTHS, [ ]  Burning with urination, [ ]  Difficulty urinating Skin: [ ]  Rashes, [x ] Wounds Psychological: [ ]  Anxiety, [ ]  Depression  Physical Examination Vitals:   04/25/20 1900  04/25/20 2300 04/26/20 0300 04/26/20 0843  BP: (!) 150/63 (!) 161/52 (!) 129/38 (!) 163/54  Pulse: 67 65 65 61  Resp: 18 17 16 18   Temp: 98.1 F (36.7 C) 98.8 F (37.1 C) 98.9 F (37.2 C) 98.3 F (36.8 C)  TempSrc: Oral Oral Oral Oral  SpO2: 93% 94% 93% 92%  Weight:      Height:       Body mass index is 27.41 kg/m.  General:  WDWN in NAD Gait: Normal HENT: WNL Eyes: Pupils equal Pulmonary: normal non-labored breathing , without Rales, rhonchi,  wheezing Cardiac: RRR, without  Murmurs, rubs or gallops; No carotid bruits Abdomen: soft, NT, no masses Skin: no rashes, ulcers noted;  no Gangrene , minimal erythema in the right foot/toes in a dependent position, 4th toe nail bed dry gangrene. cellulitis; no open wounds;   Vascular Exam/Pulses:radial pulse palpable, non palpable femoral, pedal pulses B LE.  Left is warmer than right foot.  Motor of B feet is intact and sensation.     Musculoskeletal: no muscle wasting or atrophy; no edema  Neurologic: A&O X 3; Appropriate Affect ;  SENSATION: normal; MOTOR FUNCTION: 5/5 Symmetric Speech is fluent/normal   Significant Diagnostic Studies: CBC Lab Results  Component Value Date   WBC 8.5 04/26/2020   HGB 12.9 04/26/2020   HCT 38.7 04/26/2020   MCV 93.7 04/26/2020   PLT 181 04/26/2020    BMET    Component Value Date/Time   NA 136 04/26/2020 0152   K 3.4 (L) 04/26/2020 0152   CL 102 04/26/2020 0152   CO2 23 04/26/2020 0152   GLUCOSE 117 (H) 04/26/2020 0152   BUN 31 (H) 04/26/2020 0152   CREATININE 0.81 04/26/2020 0152   CALCIUM 8.8 (L) 04/26/2020 0152   GFRNONAA >60 04/26/2020 0152   GFRAA 52 (L) 02/18/2018 1610   Estimated Creatinine Clearance: 70.1 mL/min (by C-G formula based on SCr of 0.81 mg/dL).  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 Continue glucose control.  Heparin OK for now.   04/26/2020 8:46 AM   This is a 69 year old female with diabetes, who has undergone left fourth toe nail removal approximately 4 weeks ago.  She is recently developed redness and pain.  She also has developed the inability to put weight on  her knee.  She has undergone aspiration and is being followed by orthopedics.  Her uric acid level was elevated so likely diagnosis is gout.  She is currently on airborne precautions because of x-ray findings suggestive of possible TB.  She is followed by pulmonary who is planning on bronchoscopy in a few weeks.  On examination she does not have a palpable femoral pulse on the left but it is palpable on the right.  I suspect her issues with her nailbed on the left are secondary to atherosclerotic disease from her diabetes.  We discussed proceeding with angiography.  This would be through a right femoral approach with abdominal aortogram and bilateral runoff, and intervention as indicated.  Most likely, her procedure will be scheduled for Friday.  I discussed the details of procedure with the patient including the risks and benefits.  All questions were answered and she wishes to proceed.  Durene Cal

## 2020-04-26 NOTE — H&P (View-Only) (Signed)
[facility] NOTE   MRN : [id number]  Reason for Consult: left foot wound Referring Physician: Dr. Lajoyce Corners  History of Present Illness: 69 y/o female that presents to the hospital with left knee pain and left 4th toe nail plate non healing wound with dry gangrene.  She states her PCP removed the right 4th toe nail a month ago.  She denise history of claudication symptom, rest pain or previous non healing.     She does have DM, hypercholesterolemia, and HTN.       Current Facility-Administered Medications  Medication Dose Route Frequency Provider Last Rate Last Admin  . acetaminophen (TYLENOL) tablet 650 mg  650 mg Oral Q6H PRN Hillary Bow, DO   650 mg at 04/25/20 2239   Or  . acetaminophen (TYLENOL) suppository 650 mg  650 mg Rectal Q6H PRN Hillary Bow, DO      . atorvastatin (LIPITOR) tablet 20 mg  20 mg Oral Daily Lyda Perone M, DO   20 mg at 04/25/20 0934  . benazepril (LOTENSIN) tablet 10 mg  10 mg Oral Daily Lyda Perone M, DO   10 mg at 04/25/20 0934  . bupivacaine (MARCAINE) 0.5 % injection 10 mL  10 mL Infiltration Once Freeman Caldron, PA-C      . cefTRIAXone (ROCEPHIN) 2 g in sodium chloride 0.9 % 100 mL IVPB  2 g Intravenous Daily Hillary Bow, DO   Stopped at 04/25/20 0751  . empagliflozin (JARDIANCE) tablet 25 mg  25 mg Oral Daily Dahal, Binaya, MD   25 mg at 04/25/20 0935  . heparin ADULT infusion 100 units/mL (25000 units/282mL sodium chloride 0.45%)  1,500 Units/hr Intravenous Continuous Stevphen Rochester, RPH 15 mL/hr at 04/26/20 0603 1,500 Units/hr at 04/26/20 0603  . insulin aspart (novoLOG) injection 0-9 Units  0-9 Units Subcutaneous Q4H Hillary Bow, DO   2 Units at 04/26/20 0029  . methylPREDNISolone acetate (DEPO-MEDROL) injection 40 mg  40 mg Intra-articular Once Freeman Caldron, PA-C      . ondansetron [facility]) tablet 4 mg  4 mg Oral Q6H PRN Hillary Bow, DO       Or  . ondansetron [facility])  injection 4 mg  4 mg Intravenous Q6H PRN Hillary Bow, DO      . vancomycin Luna Kitchens) IVPB 750 mg/150 mL  750 mg Intravenous Q12H Daylene Posey, RPH 150 mL/hr at 04/25/20 2233 750 mg at 04/25/20 2233    Pt meds include: Statin :Yes Betablocker: No ASA: No Other anticoagulants/antiplatelets: currently on Heparin  Past Medical History:  Diagnosis Date  . Diabetes mellitus without complication (HCC)   . High cholesterol   . Hypertension     History reviewed. No pertinent surgical history.  Social History Social History   Tobacco Use  . Smoking status: Current Every Day Smoker  Substance Use Topics  . Alcohol use: Never  . Drug use: Never    Family History Family History  Problem Relation Age of Onset  . Peripheral Artery Disease Neg Hx     No Known Allergies   REVIEW OF SYSTEMS  General: [ ]  Weight loss, [ ]  Fever, [ ]  chills Neurologic: [ ]  Dizziness, [ ]  Blackouts, [ ]  Seizure [ ]  Stroke, [ ]  "Mini stroke", [ ]  Slurred speech, [ ]  Temporary blindness; [ ]  weakness in arms or legs, [ ]  Hoarseness [ ]  Dysphagia Cardiac: [ ]  Chest pain/pressure, [ ]  Shortness of breath at rest [ ]   Shortness of breath with exertion, [ ]  Atrial fibrillation or irregular heartbeat  Vascular: [ ]  Pain in legs with walking, [ ]  Pain in legs at rest, [ ]  Pain in legs at night,  [ ]  Non-healing ulcer, [ ]  Blood clot in vein/DVT,   Pulmonary: [ ]  Home oxygen, [ ]  Productive cough, [ ]  Coughing up blood, [ ]  Asthma,  [ ]  Wheezing [ ]  COPD Musculoskeletal:  [ ]  Arthritis, [ ]  Low back pain, [x ] Joint pain Hematologic: [ ]  Easy Bruising, [ ]  Anemia; [ ]  Hepatitis Gastrointestinal: [ ]  Blood in stool, [ ]  Gastroesophageal Reflux/heartburn, Urinary: [ ]  chronic Kidney disease, [ ]  on HD - [ ]  MWF or [ ]  TTHS, [ ]  Burning with urination, [ ]  Difficulty urinating Skin: [ ]  Rashes, [x ] Wounds Psychological: [ ]  Anxiety, [ ]  Depression  Physical Examination Vitals:   04/25/20 1900  04/25/20 2300 04/26/20 0300 04/26/20 0843  BP: (!) 150/63 (!) 161/52 (!) 129/38 (!) 163/54  Pulse: 67 65 65 61  Resp: 18 17 16 18   Temp: 98.1 F (36.7 C) 98.8 F (37.1 C) 98.9 F (37.2 C) 98.3 F (36.8 C)  TempSrc: Oral Oral Oral Oral  SpO2: 93% 94% 93% 92%  Weight:      Height:       Body mass index is 27.41 kg/m.  General:  WDWN in NAD Gait: Normal HENT: WNL Eyes: Pupils equal Pulmonary: normal non-labored breathing , without Rales, rhonchi,  wheezing Cardiac: RRR, without  Murmurs, rubs or gallops; No carotid bruits Abdomen: soft, NT, no masses Skin: no rashes, ulcers noted;  no Gangrene , minimal erythema in the right foot/toes in a dependent position, 4th toe nail bed dry gangrene. cellulitis; no open wounds;   Vascular Exam/Pulses:radial pulse palpable, non palpable femoral, pedal pulses B LE.  Left is warmer than right foot.  Motor of B feet is intact and sensation.     Musculoskeletal: no muscle wasting or atrophy; no edema  Neurologic: A&O X 3; Appropriate Affect ;  SENSATION: normal; MOTOR FUNCTION: 5/5 Symmetric Speech is fluent/normal   Significant Diagnostic Studies: CBC Lab Results  Component Value Date   WBC 8.5 04/26/2020   HGB 12.9 04/26/2020   HCT 38.7 04/26/2020   MCV 93.7 04/26/2020   PLT 181 04/26/2020    BMET    Component Value Date/Time   NA 136 04/26/2020 0152   K 3.4 (L) 04/26/2020 0152   CL 102 04/26/2020 0152   CO2 23 04/26/2020 0152   GLUCOSE 117 (H) 04/26/2020 0152   BUN 31 (H) 04/26/2020 0152   CREATININE 0.81 04/26/2020 0152   CALCIUM 8.8 (L) 04/26/2020 0152   GFRNONAA >60 04/26/2020 0152   GFRAA 52 (L) 02/18/2018 1610   Estimated Creatinine Clearance: 70.1 mL/min (by C-G formula based on SCr of 0.81 mg/dL).  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 Continue glucose control.  Heparin OK for now.   04/26/2020 8:46 AM   This is a 69 year old female with diabetes, who has undergone left fourth toe nail removal approximately 4 weeks ago.  She is recently developed redness and pain.  She also has developed the inability to put weight on  her knee.  She has undergone aspiration and is being followed by orthopedics.  Her uric acid level was elevated so likely diagnosis is gout.  She is currently on airborne precautions because of x-ray findings suggestive of possible TB.  She is followed by pulmonary who is planning on bronchoscopy in a few weeks.  On examination she does not have a palpable femoral pulse on the left but it is palpable on the right.  I suspect her issues with her nailbed on the left are secondary to atherosclerotic disease from her diabetes.  We discussed proceeding with angiography.  This would be through a right femoral approach with abdominal aortogram and bilateral runoff, and intervention as indicated.  Most likely, her procedure will be scheduled for Friday.  I discussed the details of procedure with the patient including the risks and benefits.  All questions were answered and she wishes to proceed.  Durene Cal

## 2020-04-26 NOTE — Progress Notes (Signed)
Initial Nutrition Assessment  DOCUMENTATION CODES:   Not applicable  INTERVENTION:  Glucerna Shake po TID, each supplement provides 220 kcal and 10 grams of protein  Multivitamin with minerals daily    NUTRITION DIAGNOSIS:   Increased nutrient needs related to wound healing as evidenced by estimated needs.   GOAL:   Patient will meet greater than or equal to 90% of their needs   MONITOR:   PO intake, Supplement acceptance  REASON FOR ASSESSMENT:   Consult Wound healing  ASSESSMENT:   Pt presented with diabetic infection of left foot that prevents mobility. Left 4th nail plate non healing wound with dry gangrene. PMH of DM, HTN, hypercholestrolemia, current smoker.   Pt reports to have had DM for 20 years, reports of poor control of DM without regularly checking glucose at home. Pt stated she does take oral medications regularly.   Pt reports of good appetite at home with 3 meals a day. Typical intake: Breakfast: eggs, toast,  Lunch: sandwich and soup, Dinner: meat, starch, vegetable.     Pt reports normal weight of 175 lbs with no recent weight loss.   Pt has tried Ensure in the past and was agreeable to try Glucerna while in hospital. Spoke with pt on the importance of good nutrition for wound healing.   Pt reports that she first noticed wound on toe about a month ago. Went to her PCP who removed the toenail and provided her with topical creams for treatment but no results. Vascular and orthopedics have been consulted; awaiting further workup to determine further plan or care.   Labs reviewed: Glucose 117 Medications reviewed and include: SSI   Lab Results  Component Value Date   HGBA1C 7.3 (H) 04/25/2020     NUTRITION - FOCUSED PHYSICAL EXAM:    Most Recent Value  Orbital Region No depletion  Upper Arm Region No depletion  Thoracic and Lumbar Region No depletion  Buccal Region No depletion  Temple Region No depletion  Clavicle Bone Region No depletion   Clavicle and Acromion Bone Region No depletion  Scapular Bone Region No depletion  Dorsal Hand No depletion  Patellar Region No depletion  Anterior Thigh Region Mild depletion  Posterior Calf Region Mild depletion  Edema (RD Assessment) None  Hair Reviewed  Eyes Reviewed  Mouth Unable to assess  Skin Reviewed  Nails Reviewed       Diet Order:   Diet Order            Diet Carb Modified Fluid consistency: Thin; Room service appropriate? Yes  Diet effective now                 EDUCATION NEEDS:   Education needs have been addressed  Skin:  Skin Assessment: Reviewed RN Assessment  Last BM:  unknown  Height:   Ht Readings from Last 1 Encounters:  04/25/20 5\' 7"  (1.702 m)    Weight:   Wt Readings from Last 1 Encounters:  04/25/20 79.4 kg    Ideal Body Weight:  61.4 kg  BMI:  Body mass index is 27.41 kg/m.  Estimated Nutritional Needs:   Kcal:  1750-1950  Protein:  95-105  Fluid:  >1.7 L    13/09/21, Dietetic Intern Pager: [phone] If unavailable: [phone]

## 2020-04-26 NOTE — Progress Notes (Signed)
PROGRESS NOTE  [facility] ZOX:[id number] DOB: 09/20/1950 DOA: 04/24/2020 PCP: Maurice Small, MD  Brief History   69 year old woman PMH including diabetes mellitus type 2, smoker, had left fourth toenail removed several weeks prior, presented with swelling and pain of the fourth and fifth toe.  Also reported tenderness left knee.  Admitted for left foot diabetic cellulitis without evidence of sepsis.  Seen by orthopedics for evaluation of left knee and left foot.  Left knee underwent arthrocentesis, orthopedics suspected gout and started colchicine, patient has clinically improved.  Treated with antibiotics for left foot.  Had ABI which was abnormal.  Seen by vascular surgery plan for angiogram.  Also noted to have pulmonary cavitation, seen by pulmonology with plans for outpatient work-up.   A & P  Left foot diabetic cellulitis without evidence of sepsis, complicated by suspected arterial disease. --ABI abnormal, VVS plans angiogram Friday --continue abx --Left lower extremity venous Doppler negative  Suspected PAD LLE --plan as above  Left knee pain --s/p arthrocentesis, gout suspected by ortho, no crystals on cytology, but clinically improved with colchicine --further management per orthopedics  Pulmonary cavitation --etiology unclear, low risk for TB, await QF gold --outpt follow-up w/ pulm for bronch  DM type 2. Hold metformin. --CBG stable --continue present management  Disposition Plan:  Discussion: Continue antibiotics, follow-up further recommendations per vascular surgery.  Status is: Inpatient  Remains inpatient appropriate because:Inpatient level of care appropriate due to severity of illness   Dispo: The patient is from: Home              Anticipated d/c is to: Home              Anticipated d/c date is: 2 days              Patient currently is not medically stable to d/c.  DVT prophylaxis: enoxaparin   Code Status: Full Code Family Communication:  none  Brendia Sacks, MD  [facility] Direct contact: see [email or web address] (further directions at bottom of note if needed) 7PM-7AM contact night coverage as at bottom of note 04/26/2020, 5:17 PM  LOS: 1 day   Significant Hospital Events   .    Consults:  . Orthopedics . [facility]   Procedures:  .   Significant Diagnostic Tests:  Marland Kitchen    Micro Data:  .    Antimicrobials:  .   Interval History/Subjective  CC: f/u left foot infection  Feels ok, left knee much better, much less painful. No known h/o gout. Left toe ok.  Objective   Vitals:  Vitals:   04/26/20 0843 04/26/20 1645  BP: (!) 163/54 (!) 142/47  Pulse: 61 (!) 58  Resp: 18 18  Temp: 98.3 F (36.8 C) 98.3 F (36.8 C)  SpO2: 92% 95%    Exam:  Physical Exam Vitals reviewed.  Constitutional:      General: She is not in acute distress.    Appearance: Normal appearance. She is not ill-appearing or toxic-appearing.  Cardiovascular:     Rate and Rhythm: Normal rate and regular rhythm.     Heart sounds: No murmur heard.  No friction rub. No gallop.   Pulmonary:     Effort: Pulmonary effort is normal. No respiratory distress.     Breath sounds: Normal breath sounds. No wheezing, rhonchi or rales.  Musculoskeletal:        General: No swelling.     Left lower leg: No edema.  Skin:    Comments: Left 4th toe  w/ signs of gangrene Left knee appears unremarkable  Neurological:     Mental Status: She is alert.      I have personally reviewed the following:   Today's Data  . CBG stable . K+ 3.4 . Uric acid sl high . CBC unremarakble   Scheduled Meds: . atorvastatin  20 mg Oral Daily  . benazepril  10 mg Oral Daily  . bupivacaine  10 mL Infiltration Once  . colchicine  0.6 mg Oral Daily  . empagliflozin  25 mg Oral Daily  . feeding supplement (GLUCERNA SHAKE)  237 mL Oral TID BM  . insulin aspart  0-9 Units Subcutaneous Q4H  . methylPREDNISolone acetate  40 mg Intra-articular Once  . multivitamin with  minerals  1 tablet Oral Daily   Continuous Infusions: . sodium chloride 250 mL (04/26/20 1153)  . cefTRIAXone (ROCEPHIN)  IV 2 g (04/26/20 1154)  . vancomycin 750 mg (04/26/20 1438)    Principal Problem:   Diabetic infection of left foot (HCC) Active Problems:   DM2 (diabetes mellitus, type 2) (HCC)   CKD (chronic kidney disease) stage 3, GFR 30-59 ml/min (HCC)   HTN (hypertension)   Cavitary lesion of lung   Cellulitis of toe of left foot   Idiopathic chronic gout of left knee without tophus   Gangrene of toe of left foot (HCC)   LOS: 1 day   How to contact the [facility] Attending or Consulting provider 7A - 7P or covering provider during after hours 7P -7A, for this patient?  1. Check the care team in [facility] and look for a) attending/consulting [facility] provider listed and b) the [facility] team listed 2. Log into [email or web address] and use [facility]'s universal password to access. If you do not have the password, please contact the hospital operator. 3. Locate the [facility] provider you are looking for under [facility] and page to a number that you can be directly reached. 4. If you still have difficulty reaching the provider, please page the [facility] (Director on Call) for the Hospitalists listed on amion for assistance.

## 2020-04-26 NOTE — Progress Notes (Signed)
Name: Holly Stevenson MRN: [id number] DOB: 1950/06/24    ADMISSION DATE:  04/24/2020 CONSULTATION DATE: 04/24/2020  REFERRING MD : [facility]  CHIEF COMPLAINT: Left foot knee pain  BRIEF PATIENT DESCRIPTION:  69 year old female whose chief complaint is left lower extremity pain  SIGNIFICANT EVENTS    STUDIES:  CT of the chest is noted 2D echo ordered 11/8>> LV EF 60-65%, grade 1 diastolic dysfunction, normal RV size and function, no evidence of valvular disease or endocarditis QuantiFERON gold 11/9 >>   HISTORY OF PRESENT ILLNESS:   69 year old female with a known history of diabetes mellitus on oral agents, current smoker, hypertension and high cholesterol who recently had her left fourth toe nail removed per primary care physician for being loose. She presents to the emergency room with left foot pain and left knee pain that leaves her incapable of walking. She had a CT of the chest which revealed left upper lobe cavitary lesion debris in the bronchus. She is currently being worked up for tuberculosis with QuantiFERON gold. Pulmonary critical care is been asked to evaluate for cavitary lesion.    SUBJECTIVE:   No respiratory symptoms or complaints.  No cough, no fever (on antibiotics for lower extremity) Doppler ultrasound of lower extremity pending, remains on heparin   VITAL SIGNS: Temp:  [98.1 F (36.7 C)-98.9 F (37.2 C)] 98.3 F (36.8 C) (11/10 0843) Pulse Rate:  [61-82] 61 (11/10 0843) Resp:  [15-23] 18 (11/10 0843) BP: (115-176)/(38-117) 163/54 (11/10 0843) SpO2:  [91 %-96 %] 92 % (11/10 0843)  PHYSICAL EXAMINATION: General: No distress, sitting up at bedside Neuro: Awake alert appropriate, nonfocal HEENT:   No JVD, no stridor, oropharynx clear Cardiovascular: Regular, distant, no murmur Lungs: Clear bilaterally, no wheeze Abdomen: Nondistended, benign Musculoskeletal: Erythema persists left fourth toe, toenail absent Skin: Warm and dry except for left  foot left leg  Recent Labs  Lab 04/25/20 0357 04/26/20 0152  NA 136 136  K 4.1 3.4*  CL 101 102  CO2  --  23  BUN 46* 31*  CREATININE 1.20* 0.81  GLUCOSE 213* 117*   Recent Labs  Lab 04/25/20 0345 04/25/20 0357 04/26/20 0152  HGB 15.0 15.6* 12.9  HCT 45.8 46.0 38.7  WBC 10.0  --  8.5  PLT 228  --  181   DG Tibia/Fibula Left  Result Date: 04/25/2020 CLINICAL DATA:  Pain EXAM: LEFT TIBIA AND FIBULA - 2 VIEW COMPARISON:  None. FINDINGS: There is no evidence of fracture or other focal bone lesions. Soft tissues are unremarkable. IMPRESSION: Negative. Electronically Signed   By: Katherine Mantle M.D.   On: 04/25/2020 03:48   CT Angio Chest PE W and/or Wo Contrast  Result Date: 04/25/2020 CLINICAL DATA:  Chest pain or shortness of breath with pleurisy or effusion suspected. EXAM: CT ANGIOGRAPHY CHEST WITH CONTRAST TECHNIQUE: Multidetector CT imaging of the chest was performed using the standard protocol during bolus administration of intravenous contrast. Multiplanar CT image reconstructions and MIPs were obtained to evaluate the vascular anatomy. CONTRAST:  95mL OMNIPAQUE IOHEXOL 350 MG/ML SOLN COMPARISON:  None. FINDINGS: Cardiovascular: Satisfactory opacification of the pulmonary arteries to the segmental level. No evidence of pulmonary embolism. Normal heart size. No pericardial effusion. Diffuse atheromatous calcification of the aorta and great vessels. Coronary calcifications on the left and right. Mediastinum/Nodes: Negative for adenopathy or mass. Lungs/Pleura: Cavitary lesion in the right upper lobe with irregular wall, 2.4 x 4.5 cm on coronal reformats. There is regional nodularity which is likely endobronchial  spread of debris. This could be malignancy or atypical infection, including tuberculosis. The finding is known given it has already been charted in epic. Non infectious granulomatous disease is considered less likely given the solitary lesion. There are a few areas of  mucoid impaction superimposed on generalized airway thickening. Centrilobular emphysema Upper Abdomen: Negative Musculoskeletal: Spondylosis.  No acute or aggressive finding. Review of the MIP images confirms the above findings. IMPRESSION: 1. Large cavitary lesion in the right upper lobe, likely either atypical infection (including tuberculosis) or malignancy. 2. Right upper lobe airspace disease that is probably endobronchial spread of debris/infection. 3. Negative for pulmonary embolism. 4. Aortic Atherosclerosis (ICD10-I70.0) and Emphysema (ICD10-J43.9). Electronically Signed   By: Marnee Spring M.D.   On: 04/25/2020 05:03   DG Foot Complete Left  Result Date: 04/25/2020 CLINICAL DATA:  Pain in the fourth digit. EXAM: LEFT FOOT - COMPLETE 3+ VIEW COMPARISON:  None. FINDINGS: There are advanced degenerative changes of the first metatarsophalangeal joint. There is no definite acute displaced fracture or dislocation. There is no radiographic evidence for osteomyelitis. There is soft tissue swelling about the fourth digit. IMPRESSION: 1. No acute displaced fracture or dislocation. 2. Soft tissue swelling about the fourth digit. 3. Advanced degenerative changes of the first metatarsophalangeal joint. Electronically Signed   By: Katherine Mantle M.D.   On: 04/25/2020 03:49   ECHOCARDIOGRAM COMPLETE  Result Date: 04/25/2020    ECHOCARDIOGRAM REPORT   Patient Name:   Holly Stevenson Date of Exam: 04/25/2020 Medical Rec #:  [id number]             Height:       67.0 in Accession #:    [id number]            Weight:       175.0 lb Date of Birth:  06-13-1951             BSA:          1.911 m Patient Age:    69 years              BP:           140/51 mmHg Patient Gender: F                     HR:           69 bpm. Exam Location:  Inpatient Procedure: 2D Echo Indications:    Bacteremia R78.81  History:        Patient has no prior history of Echocardiogram examinations.                 Risk Factors:Hypertension  and Diabetes.  Sonographer:    Thurman Coyer RDCS (AE) Referring Phys: [id number] Chrissie Noa S MINOR IMPRESSIONS  1. Left ventricular ejection fraction, by estimation, is 60 to 65%. The left ventricle has normal function. The left ventricle has no regional wall motion abnormalities. Left ventricular diastolic parameters are consistent with Grade I diastolic dysfunction (impaired relaxation).  2. Right ventricular systolic function is normal. The right ventricular size is normal.  3. The mitral valve is normal in structure. No evidence of mitral valve regurgitation. No evidence of mitral stenosis.  4. The aortic valve is normal in structure. Aortic valve regurgitation is not visualized. No aortic stenosis is present.  5. The inferior vena cava is normal in size with greater than 50% respiratory variability, suggesting right atrial pressure of 3 mmHg. Conclusion(s)/Recommendation(s): No evidence of valvular vegetations on this  transthoracic echocardiogram. Would recommend a transesophageal echocardiogram to exclude infective endocarditis if clinically indicated. FINDINGS  Left Ventricle: Left ventricular ejection fraction, by estimation, is 60 to 65%. The left ventricle has normal function. The left ventricle has no regional wall motion abnormalities. The left ventricular internal cavity size was normal in size. There is  no left ventricular hypertrophy. Left ventricular diastolic parameters are consistent with Grade I diastolic dysfunction (impaired relaxation). Indeterminate filling pressures. Right Ventricle: The right ventricular size is normal. No increase in right ventricular wall thickness. Right ventricular systolic function is normal. Left Atrium: Left atrial size was normal in size. Right Atrium: Right atrial size was normal in size. Pericardium: There is no evidence of pericardial effusion. Mitral Valve: The mitral valve is normal in structure. No evidence of mitral valve regurgitation. No evidence of mitral  valve stenosis. Tricuspid Valve: The tricuspid valve is normal in structure. Tricuspid valve regurgitation is not demonstrated. No evidence of tricuspid stenosis. Aortic Valve: The aortic valve is normal in structure. Aortic valve regurgitation is not visualized. No aortic stenosis is present. Pulmonic Valve: The pulmonic valve was normal in structure. Pulmonic valve regurgitation is not visualized. No evidence of pulmonic stenosis. Aorta: The aortic root is normal in size and structure. Venous: The inferior vena cava is normal in size with greater than 50% respiratory variability, suggesting right atrial pressure of 3 mmHg. IAS/Shunts: No atrial level shunt detected by color flow Doppler.  LEFT VENTRICLE PLAX 2D LVIDd:         3.60 cm  Diastology LVIDs:         2.30 cm  LV e' medial:    5.22 cm/s LV PW:         0.90 cm  LV E/e' medial:  11.9 LV IVS:        0.90 cm  LV e' lateral:   4.90 cm/s LVOT diam:     2.00 cm  LV E/e' lateral: 12.7 LV SV:         78 LV SV Index:   41 LVOT Area:     3.14 cm  RIGHT VENTRICLE RV S prime:     13.10 cm/s TAPSE (M-mode): 1.2 cm LEFT ATRIUM             Index LA diam:        3.30 cm 1.73 cm/m LA Vol (A2C):   29.8 ml 15.60 ml/m LA Vol (A4C):   20.6 ml 10.78 ml/m LA Biplane Vol: 25.1 ml 13.14 ml/m  AORTIC VALVE LVOT Vmax:   140.00 cm/s LVOT Vmean:  93.100 cm/s LVOT VTI:    0.248 m  AORTA Ao Root diam: 2.50 cm MITRAL VALVE MV Area (PHT): 1.93 cm    SHUNTS MV Decel Time: 394 msec    Systemic VTI:  0.25 m MV E velocity: 62.10 cm/s  Systemic Diam: 2.00 cm MV A velocity: 97.50 cm/s MV E/A ratio:  0.64 Mihai Croitoru MD Electronically signed by Mihai Croitoru MD Signature Date/Time: 04/25/2020/3:22:05 PM    Final     ASSESSMENT: Principal Problem:    Cavitary lesion of lung left upper lobe   Diabetic infection of left foot (HCC)? bacteremia Active Problems:   DM2 (diabetes mellitus, type 2) (HCC)   CKD (chronic kidney disease) stage 3, GFR 30-59 ml/min (HCC)   HTN  (hypertension)  Discussion: 76-year-old female, lifelong smoker, but no diabetes mellitus with poor control only on oral agents. She denies fevers chills sputum production shortness of breath. She presented to   [facility] emergency department ostensibly for left foot pain left knee pain. She recently had her fourth toe nail removed on the left foot. She has obvious infection and questionable bacteremia on her left foot and now has left knee pain the left knee that is hot swollen and painful. She had a CT of the chest which was negative for PE but did demonstrate a left upper lobe cavitary lesion with debris in the mainstem bronchus. Pulmonary critical care has been called to evaluate. She was examined found to be in no acute distress other than left foot pain and left knee pain. She denies shortness of breath or changes in her pulmonary functions. He does smoke on a daily basis she has been counseled to stop. Her only past medical history is diabetes mellitus, high cholesterol, and hypertension.  In the absence of any cough, fevers, chills, sweats suspect that this is not a lung abscess or focal pneumonia.  More suspicious based on the clinical history that this represents malignancy.  I explained this to her today.  Discussed the pros and cons of navigational bronchoscopy to achieve a tissue diagnosis.  I recommended that we pursue ENB once her active hospital issues including her lower extremity infection, possible thrombophlebitis (currently on heparin), possible knee joint infection, can be addressed.  We can arrange for navigational bronchoscopy as an outpatient.  She prefers 05/09/2020 and I have arranged.  Plans: -Continue vascular evaluation, antibiotics as ordered -Doubt TB but agree with isolation until QuantiFERON gold result obtained.  Suspicion is low and I do not believe she needs bronchoscopy for AFB sampling as long as the QuantiFERON gold is negative -I have set her up for navigational  bronchoscopy on 11/23 as an outpatient.  Questions answered.  Risk and benefits discussed.   Levy Pupa, MD, PhD 04/26/2020, 9:19 AM [facility] [phone] or if no answer [phone]

## 2020-04-26 NOTE — Progress Notes (Signed)
Bilateral Lower Ext. study completed.   See CVProc for preliminary results.   Koben Daman, RDMS, RVT 

## 2020-04-26 NOTE — Progress Notes (Signed)
Wound care performed on pt. Fourth left toe cleansed, dried, betadine swab applied. Gauze placed to cover area, kerlix wrapped around foot. Pt tolerated. No pain at present time.

## 2020-04-26 NOTE — Progress Notes (Signed)
ABI study completed.   See CVProc for preliminary results.   Madie Cahn, RDMS, RVT 

## 2020-04-26 NOTE — Hospital Course (Signed)
69 year old woman PMH including diabetes mellitus type 2, smoker, had left fourth toenail removed several weeks prior, presented with swelling and pain of the fourth and fifth toe.  Also reported tenderness left knee.  Admitted for left foot diabetic cellulitis without evidence of sepsis.  Seen by orthopedics for evaluation of left knee and left foot.  Left knee underwent arthrocentesis, orthopedics suspected gout and started colchicine, patient has clinically improved.  Treated with antibiotics for left foot.  Had ABI which was abnormal.  Seen by vascular surgery plan for angiogram.  Also noted to have pulmonary cavitation, seen by pulmonology with plans for outpatient work-up.

## 2020-04-26 NOTE — Progress Notes (Signed)
Asked by Dr. Lajoyce Corners to see patient.  I will be by later this am   Holly Stevenson

## 2020-04-26 NOTE — H&P (View-Only) (Signed)
Name: Tinamarie Przybylski MRN: [id number] DOB: 1950/06/24    ADMISSION DATE:  04/24/2020 CONSULTATION DATE: 04/24/2020  REFERRING MD : [facility]  CHIEF COMPLAINT: Left foot knee pain  BRIEF PATIENT DESCRIPTION:  69 year old female whose chief complaint is left lower extremity pain  SIGNIFICANT EVENTS    STUDIES:  CT of the chest is noted 2D echo ordered 11/8>> LV EF 60-65%, grade 1 diastolic dysfunction, normal RV size and function, no evidence of valvular disease or endocarditis QuantiFERON gold 11/9 >>   HISTORY OF PRESENT ILLNESS:   69 year old female with a known history of diabetes mellitus on oral agents, current smoker, hypertension and high cholesterol who recently had her left fourth toe nail removed per primary care physician for being loose. She presents to the emergency room with left foot pain and left knee pain that leaves her incapable of walking. She had a CT of the chest which revealed left upper lobe cavitary lesion debris in the bronchus. She is currently being worked up for tuberculosis with QuantiFERON gold. Pulmonary critical care is been asked to evaluate for cavitary lesion.    SUBJECTIVE:   No respiratory symptoms or complaints.  No cough, no fever (on antibiotics for lower extremity) Doppler ultrasound of lower extremity pending, remains on heparin   VITAL SIGNS: Temp:  [98.1 F (36.7 C)-98.9 F (37.2 C)] 98.3 F (36.8 C) (11/10 0843) Pulse Rate:  [61-82] 61 (11/10 0843) Resp:  [15-23] 18 (11/10 0843) BP: (115-176)/(38-117) 163/54 (11/10 0843) SpO2:  [91 %-96 %] 92 % (11/10 0843)  PHYSICAL EXAMINATION: General: No distress, sitting up at bedside Neuro: Awake alert appropriate, nonfocal HEENT:   No JVD, no stridor, oropharynx clear Cardiovascular: Regular, distant, no murmur Lungs: Clear bilaterally, no wheeze Abdomen: Nondistended, benign Musculoskeletal: Erythema persists left fourth toe, toenail absent Skin: Warm and dry except for left  foot left leg  Recent Labs  Lab 04/25/20 0357 04/26/20 0152  NA 136 136  K 4.1 3.4*  CL 101 102  CO2  --  23  BUN 46* 31*  CREATININE 1.20* 0.81  GLUCOSE 213* 117*   Recent Labs  Lab 04/25/20 0345 04/25/20 0357 04/26/20 0152  HGB 15.0 15.6* 12.9  HCT 45.8 46.0 38.7  WBC 10.0  --  8.5  PLT 228  --  181   DG Tibia/Fibula Left  Result Date: 04/25/2020 CLINICAL DATA:  Pain EXAM: LEFT TIBIA AND FIBULA - 2 VIEW COMPARISON:  None. FINDINGS: There is no evidence of fracture or other focal bone lesions. Soft tissues are unremarkable. IMPRESSION: Negative. Electronically Signed   By: Katherine Mantle M.D.   On: 04/25/2020 03:48   CT Angio Chest PE W and/or Wo Contrast  Result Date: 04/25/2020 CLINICAL DATA:  Chest pain or shortness of breath with pleurisy or effusion suspected. EXAM: CT ANGIOGRAPHY CHEST WITH CONTRAST TECHNIQUE: Multidetector CT imaging of the chest was performed using the standard protocol during bolus administration of intravenous contrast. Multiplanar CT image reconstructions and MIPs were obtained to evaluate the vascular anatomy. CONTRAST:  95mL OMNIPAQUE IOHEXOL 350 MG/ML SOLN COMPARISON:  None. FINDINGS: Cardiovascular: Satisfactory opacification of the pulmonary arteries to the segmental level. No evidence of pulmonary embolism. Normal heart size. No pericardial effusion. Diffuse atheromatous calcification of the aorta and great vessels. Coronary calcifications on the left and right. Mediastinum/Nodes: Negative for adenopathy or mass. Lungs/Pleura: Cavitary lesion in the right upper lobe with irregular wall, 2.4 x 4.5 cm on coronal reformats. There is regional nodularity which is likely endobronchial  spread of debris. This could be malignancy or atypical infection, including tuberculosis. The finding is known given it has already been charted in epic. Non infectious granulomatous disease is considered less likely given the solitary lesion. There are a few areas of  mucoid impaction superimposed on generalized airway thickening. Centrilobular emphysema Upper Abdomen: Negative Musculoskeletal: Spondylosis.  No acute or aggressive finding. Review of the MIP images confirms the above findings. IMPRESSION: 1. Large cavitary lesion in the right upper lobe, likely either atypical infection (including tuberculosis) or malignancy. 2. Right upper lobe airspace disease that is probably endobronchial spread of debris/infection. 3. Negative for pulmonary embolism. 4. Aortic Atherosclerosis (ICD10-I70.0) and Emphysema (ICD10-J43.9). Electronically Signed   By: Marnee Spring M.D.   On: 04/25/2020 05:03   DG Foot Complete Left  Result Date: 04/25/2020 CLINICAL DATA:  Pain in the fourth digit. EXAM: LEFT FOOT - COMPLETE 3+ VIEW COMPARISON:  None. FINDINGS: There are advanced degenerative changes of the first metatarsophalangeal joint. There is no definite acute displaced fracture or dislocation. There is no radiographic evidence for osteomyelitis. There is soft tissue swelling about the fourth digit. IMPRESSION: 1. No acute displaced fracture or dislocation. 2. Soft tissue swelling about the fourth digit. 3. Advanced degenerative changes of the first metatarsophalangeal joint. Electronically Signed   By: Katherine Mantle M.D.   On: 04/25/2020 03:49   ECHOCARDIOGRAM COMPLETE  Result Date: 04/25/2020    ECHOCARDIOGRAM REPORT   Patient Name:   NOLLIE SHIFLETT Date of Exam: 04/25/2020 Medical Rec #:  [id number]             Height:       67.0 in Accession #:    [id number]            Weight:       175.0 lb Date of Birth:  06-13-1951             BSA:          1.911 m Patient Age:    69 years              BP:           140/51 mmHg Patient Gender: F                     HR:           69 bpm. Exam Location:  Inpatient Procedure: 2D Echo Indications:    Bacteremia R78.81  History:        Patient has no prior history of Echocardiogram examinations.                 Risk Factors:Hypertension  and Diabetes.  Sonographer:    Thurman Coyer RDCS (AE) Referring Phys: [id number] Chrissie Noa S MINOR IMPRESSIONS  1. Left ventricular ejection fraction, by estimation, is 60 to 65%. The left ventricle has normal function. The left ventricle has no regional wall motion abnormalities. Left ventricular diastolic parameters are consistent with Grade I diastolic dysfunction (impaired relaxation).  2. Right ventricular systolic function is normal. The right ventricular size is normal.  3. The mitral valve is normal in structure. No evidence of mitral valve regurgitation. No evidence of mitral stenosis.  4. The aortic valve is normal in structure. Aortic valve regurgitation is not visualized. No aortic stenosis is present.  5. The inferior vena cava is normal in size with greater than 50% respiratory variability, suggesting right atrial pressure of 3 mmHg. Conclusion(s)/Recommendation(s): No evidence of valvular vegetations on this  transthoracic echocardiogram. Would recommend a transesophageal echocardiogram to exclude infective endocarditis if clinically indicated. FINDINGS  Left Ventricle: Left ventricular ejection fraction, by estimation, is 60 to 65%. The left ventricle has normal function. The left ventricle has no regional wall motion abnormalities. The left ventricular internal cavity size was normal in size. There is  no left ventricular hypertrophy. Left ventricular diastolic parameters are consistent with Grade I diastolic dysfunction (impaired relaxation). Indeterminate filling pressures. Right Ventricle: The right ventricular size is normal. No increase in right ventricular wall thickness. Right ventricular systolic function is normal. Left Atrium: Left atrial size was normal in size. Right Atrium: Right atrial size was normal in size. Pericardium: There is no evidence of pericardial effusion. Mitral Valve: The mitral valve is normal in structure. No evidence of mitral valve regurgitation. No evidence of mitral  valve stenosis. Tricuspid Valve: The tricuspid valve is normal in structure. Tricuspid valve regurgitation is not demonstrated. No evidence of tricuspid stenosis. Aortic Valve: The aortic valve is normal in structure. Aortic valve regurgitation is not visualized. No aortic stenosis is present. Pulmonic Valve: The pulmonic valve was normal in structure. Pulmonic valve regurgitation is not visualized. No evidence of pulmonic stenosis. Aorta: The aortic root is normal in size and structure. Venous: The inferior vena cava is normal in size with greater than 50% respiratory variability, suggesting right atrial pressure of 3 mmHg. IAS/Shunts: No atrial level shunt detected by color flow Doppler.  LEFT VENTRICLE PLAX 2D LVIDd:         3.60 cm  Diastology LVIDs:         2.30 cm  LV e' medial:    5.22 cm/s LV PW:         0.90 cm  LV E/e' medial:  11.9 LV IVS:        0.90 cm  LV e' lateral:   4.90 cm/s LVOT diam:     2.00 cm  LV E/e' lateral: 12.7 LV SV:         78 LV SV Index:   41 LVOT Area:     3.14 cm  RIGHT VENTRICLE RV S prime:     13.10 cm/s TAPSE (M-mode): 1.2 cm LEFT ATRIUM             Index LA diam:        3.30 cm 1.73 cm/m LA Vol (A2C):   29.8 ml 15.60 ml/m LA Vol (A4C):   20.6 ml 10.78 ml/m LA Biplane Vol: 25.1 ml 13.14 ml/m  AORTIC VALVE LVOT Vmax:   140.00 cm/s LVOT Vmean:  93.100 cm/s LVOT VTI:    0.248 m  AORTA Ao Root diam: 2.50 cm MITRAL VALVE MV Area (PHT): 1.93 cm    SHUNTS MV Decel Time: 394 msec    Systemic VTI:  0.25 m MV E velocity: 62.10 cm/s  Systemic Diam: 2.00 cm MV A velocity: 97.50 cm/s MV E/A ratio:  0.64 Mihai Croitoru MD Electronically signed by Thurmon FairMihai Croitoru MD Signature Date/Time: 04/25/2020/3:22:05 PM    Final     ASSESSMENT: Principal Problem:    Cavitary lesion of lung left upper lobe   Diabetic infection of left foot (HCC)? bacteremia Active Problems:   DM2 (diabetes mellitus, type 2) (HCC)   CKD (chronic kidney disease) stage 3, GFR 30-59 ml/min (HCC)   HTN  (hypertension)  Discussion: 69 year old female, lifelong smoker, but no diabetes mellitus with poor control only on oral agents. She denies fevers chills sputum production shortness of breath. She presented to  [facility] emergency department ostensibly for left foot pain left knee pain. She recently had her fourth toe nail removed on the left foot. She has obvious infection and questionable bacteremia on her left foot and now has left knee pain the left knee that is hot swollen and painful. She had a CT of the chest which was negative for PE but did demonstrate a left upper lobe cavitary lesion with debris in the mainstem bronchus. Pulmonary critical care has been called to evaluate. She was examined found to be in no acute distress other than left foot pain and left knee pain. She denies shortness of breath or changes in her pulmonary functions. He does smoke on a daily basis she has been counseled to stop. Her only past medical history is diabetes mellitus, high cholesterol, and hypertension.  In the absence of any cough, fevers, chills, sweats suspect that this is not a lung abscess or focal pneumonia.  More suspicious based on the clinical history that this represents malignancy.  I explained this to her today.  Discussed the pros and cons of navigational bronchoscopy to achieve a tissue diagnosis.  I recommended that we pursue ENB once her active hospital issues including her lower extremity infection, possible thrombophlebitis (currently on heparin), possible knee joint infection, can be addressed.  We can arrange for navigational bronchoscopy as an outpatient.  She prefers 05/09/2020 and I have arranged.  Plans: -Continue vascular evaluation, antibiotics as ordered -Doubt TB but agree with isolation until QuantiFERON gold result obtained.  Suspicion is low and I do not believe she needs bronchoscopy for AFB sampling as long as the QuantiFERON gold is negative -I have set her up for navigational  bronchoscopy on 11/23 as an outpatient.  Questions answered.  Risk and benefits discussed.   Levy Pupa, MD, PhD 04/26/2020, 9:19 AM [facility] [phone] or if no answer [phone]

## 2020-04-27 DIAGNOSIS — J984 Other disorders of lung: Secondary | ICD-10-CM | POA: Diagnosis not present

## 2020-04-27 DIAGNOSIS — I70229 Atherosclerosis of native arteries of extremities with rest pain, unspecified extremity: Secondary | ICD-10-CM | POA: Diagnosis not present

## 2020-04-27 DIAGNOSIS — L03032 Cellulitis of left toe: Secondary | ICD-10-CM | POA: Diagnosis not present

## 2020-04-27 LAB — CBC
HCT: 37.6 % (ref 36.0–46.0)
Hemoglobin: 12.6 g/dL (ref 12.0–15.0)
MCH: 31 pg (ref 26.0–34.0)
MCHC: 33.5 g/dL (ref 30.0–36.0)
MCV: 92.6 fL (ref 80.0–100.0)
Platelets: 166 10*3/uL (ref 150–400)
RBC: 4.06 MIL/uL (ref 3.87–5.11)
RDW: 12.6 % (ref 11.5–15.5)
WBC: 6.7 10*3/uL (ref 4.0–10.5)
nRBC: 0 % (ref 0.0–0.2)

## 2020-04-27 LAB — GLUCOSE, CAPILLARY
Glucose-Capillary: 120 mg/dL — ABNORMAL HIGH (ref 70–99)
Glucose-Capillary: 128 mg/dL — ABNORMAL HIGH (ref 70–99)
Glucose-Capillary: 156 mg/dL — ABNORMAL HIGH (ref 70–99)
Glucose-Capillary: 177 mg/dL — ABNORMAL HIGH (ref 70–99)
Glucose-Capillary: 178 mg/dL — ABNORMAL HIGH (ref 70–99)
Glucose-Capillary: 207 mg/dL — ABNORMAL HIGH (ref 70–99)

## 2020-04-27 LAB — QUANTIFERON-TB GOLD PLUS (RQFGPL)
QuantiFERON Mitogen Value: 4.32 IU/mL
QuantiFERON Nil Value: 0 IU/mL
QuantiFERON TB1 Ag Value: 0.02 IU/mL
QuantiFERON TB2 Ag Value: 0.08 IU/mL

## 2020-04-27 LAB — BASIC METABOLIC PANEL
Anion gap: 10 (ref 5–15)
BUN: 27 mg/dL — ABNORMAL HIGH (ref 8–23)
CO2: 21 mmol/L — ABNORMAL LOW (ref 22–32)
Calcium: 8.6 mg/dL — ABNORMAL LOW (ref 8.9–10.3)
Chloride: 106 mmol/L (ref 98–111)
Creatinine, Ser: 0.95 mg/dL (ref 0.44–1.00)
GFR, Estimated: >60 mL/min (ref >60)
Glucose, Bld: 148 mg/dL — ABNORMAL HIGH (ref 70–99)
Potassium: 4.2 mmol/L (ref 3.5–5.1)
Sodium: 137 mmol/L (ref 135–145)

## 2020-04-27 LAB — CULTURE, BLOOD (ROUTINE X 2): Culture: NO GROWTH

## 2020-04-27 LAB — QUANTIFERON-TB GOLD PLUS: QuantiFERON-TB Gold Plus: NEGATIVE

## 2020-04-27 NOTE — H&P (View-Only) (Signed)
  Progress Note    04/27/2020 7:32 AM * No surgery date entered *  Subjective:  Minimal pain this morning   Vitals:   04/26/20 2014 04/27/20 0427  BP: (!) 156/49 (!) 146/46  Pulse: (!) 55 (!) 58  Resp: 16 16  Temp: 97.7 F (36.5 C) 97.7 F (36.5 C)  SpO2: 92% 93%    Physical Exam: Cardiac:  RRR Lungs:  nonlabored resp Extremities:  Both feet are warm. Intact sensation and motor function. Left 4th toenail bed dry  ABIs:  Right: 0.46 biphasic waveforms. GT pressure: 25 Left: 0.20 monophasic waveforms. GT pressure: 0  CBC    Component Value Date/Time   WBC 6.7 04/27/2020 0135   RBC 4.06 04/27/2020 0135   HGB 12.6 04/27/2020 0135   HCT 37.6 04/27/2020 0135   PLT 166 04/27/2020 0135   MCV 92.6 04/27/2020 0135   MCH 31.0 04/27/2020 0135   MCHC 33.5 04/27/2020 0135   RDW 12.6 04/27/2020 0135   LYMPHSABS 1.3 04/25/2020 0345   MONOABS 1.1 (H) 04/25/2020 0345   EOSABS 0.1 04/25/2020 0345   BASOSABS 0.0 04/25/2020 0345    BMET    Component Value Date/Time   NA 137 04/27/2020 0135   K 4.2 04/27/2020 0135   CL 106 04/27/2020 0135   CO2 21 (L) 04/27/2020 0135   GLUCOSE 148 (H) 04/27/2020 0135   BUN 27 (H) 04/27/2020 0135   CREATININE 0.95 04/27/2020 0135   CALCIUM 8.6 (L) 04/27/2020 0135   GFRNONAA >60 04/27/2020 0135   GFRAA 52 (L) 02/18/2018 1610     Intake/Output Summary (Last 24 hours) at 04/27/2020 0732 Last data filed at 04/27/2020 0500 Gross per 24 hour  Intake 671.72 ml  Output --  Net 671.72 ml    HOSPITAL MEDICATIONS Scheduled Meds: . atorvastatin  20 mg Oral Daily  . benazepril  10 mg Oral Daily  . bupivacaine  10 mL Infiltration Once  . colchicine  0.6 mg Oral Daily  . empagliflozin  25 mg Oral Daily  . enoxaparin (LOVENOX) injection  40 mg Subcutaneous Q24H  . feeding supplement (GLUCERNA SHAKE)  237 mL Oral TID BM  . insulin aspart  0-9 Units Subcutaneous Q4H  . methylPREDNISolone acetate  40 mg Intra-articular Once  . multivitamin  with minerals  1 tablet Oral Daily   Continuous Infusions: . sodium chloride 250 mL (04/26/20 1153)  . cefTRIAXone (ROCEPHIN)  IV 2 g (04/26/20 1154)  . vancomycin 750 mg (04/26/20 2213)   PRN Meds:.sodium chloride, acetaminophen **OR** acetaminophen, ondansetron **OR** ondansetron (ZOFRAN) IV  Assessment: 69 year old diabetic female with abnormal ABIs. Left critical limb ischemia. Tentative plan for aortogram with bilateral LE runoff tomorrow.    Plan: -NPO after MN tonight -DVT prophylaxis:  Lovenox   SANDRA SETZER, PA-C [facility] [phone] 04/27/2020  7:32 AM   Plan for angiogram tomorrow.  N.p.o. after midnight.  Wells Yong Grieser 

## 2020-04-27 NOTE — Progress Notes (Signed)
Order to d/c airborne isolation

## 2020-04-27 NOTE — Progress Notes (Signed)
PROGRESS NOTE  Holly Stevenson DGL:[id number] DOB: 11/27/1950 DOA: 04/24/2020 PCP: Maurice Small, MD  Brief History   69 year old woman PMH including diabetes mellitus type 2, smoker, had left fourth toenail removed several weeks prior, presented with swelling and pain of the fourth and fifth toe.  Also reported tenderness left knee.  Admitted for left foot diabetic cellulitis without evidence of sepsis.  Seen by orthopedics for evaluation of left knee and left foot.  Left knee underwent arthrocentesis, orthopedics suspected gout and started colchicine, patient has clinically improved.  Treated with antibiotics for left foot.  Had ABI which was abnormal.  Seen by vascular surgery plan for angiogram.  Also noted to have pulmonary cavitation, seen by pulmonology with plans for outpatient work-up.   A & P  Left foot diabetic cellulitis without evidence of sepsis, complicated by suspected arterial disease. --improving w/ abx, can probably change to oral tomorrow. Stop vanc MRSA PCR was negative.  Critical limb ischemia left leg --ABI abnormal, VVS plans angiogram 11/12. No need for heparin per VVS.  Left knee pain --s/p arthrocentesis, gout suspected by ortho, no crystals on cytology, but clinically improved with colchicine. Culture NGTD --further management per orthopedics  Pulmonary cavitation --etiology unclear, low risk for TB, QF gold negative. D/c airborne. --outpt follow-up w/ pulm for bronch  DM type 2. Hold metformin. --CBG remains stable --continue present management w/ Jardiance and SSI  Disposition Plan:  Discussion: Continue antibiotics, f/u angiogram tomorrow. Home when cleared by [facility].  Status is: Inpatient  Remains inpatient appropriate because:Inpatient level of care appropriate due to severity of illness   Dispo: The patient is from: Home              Anticipated d/c is to: Home              Anticipated d/c date is: 2 days              Patient currently is  not medically stable to d/c.  DVT prophylaxis: enoxaparin   Code Status: Full Code Family Communication: none  Brendia Sacks, MD  [facility] Direct contact: see [email or web address] (further directions at bottom of note if needed) 7PM-7AM contact night coverage as at bottom of note 04/27/2020, 4:11 PM  LOS: 2 days   Significant Hospital Events   .    Consults:  . Orthopedics . [facility]   Procedures:  .   Significant Diagnostic Tests:  Marland Kitchen    Micro Data:  .    Antimicrobials:  .   Interval History/Subjective  CC: f/u left foot infection  Feels better, knee is better, toe is ok.   Objective   Vitals:  Vitals:   04/27/20 0427 04/27/20 0953  BP: (!) 146/46 (!) 140/54  Pulse: (!) 58 (!) 52  Resp: 16 18  Temp: 97.7 F (36.5 C) 98.3 F (36.8 C)  SpO2: 93% 95%    Exam: Constitutional:   . Appears calm and comfortable Respiratory:  . CTA bilaterally, no w/r/r.  . Respiratory effort normal.  Cardiovascular:  . RRR, no m/r/g . No LE extremity edema   Abdomen:  . Abdomen appears normal; no tenderness or masses . No hernias . No HSM Musculoskeletal:  . Left knee flexion past 90 w/o pain, full extension, no edema or erythema, no pain Skin:  . Foot bandaged Psychiatric:  . Mental status o Mood, affect appropriate  I have personally reviewed the following:   Today's Data  . CBG stable . BMP unremarkable . CBC  stable  Scheduled Meds: . atorvastatin  20 mg Oral Daily  . benazepril  10 mg Oral Daily  . bupivacaine  10 mL Infiltration Once  . colchicine  0.6 mg Oral Daily  . empagliflozin  25 mg Oral Daily  . enoxaparin (LOVENOX) injection  40 mg Subcutaneous Q24H  . feeding supplement (GLUCERNA SHAKE)  237 mL Oral TID BM  . insulin aspart  0-9 Units Subcutaneous Q4H  . methylPREDNISolone acetate  40 mg Intra-articular Once  . multivitamin with minerals  1 tablet Oral Daily   Continuous Infusions: . sodium chloride 250 mL (04/26/20 1153)  . cefTRIAXone  (ROCEPHIN)  IV 2 g (04/27/20 1004)  . vancomycin 750 mg (04/27/20 1155)    Principal Problem:   Critical lower limb ischemia (HCC) Active Problems:   Diabetic infection of left foot (HCC)   DM2 (diabetes mellitus, type 2) (HCC)   CKD (chronic kidney disease) stage 3, GFR 30-59 ml/min (HCC)   HTN (hypertension)   Cavitary lesion of lung   Cellulitis of toe of left foot   Idiopathic chronic gout of left knee without tophus   Gangrene of toe of left foot (HCC)   PAD (peripheral artery disease) (HCC)   LOS: 2 days   How to contact the [facility] Attending or Consulting provider 7A - 7P or covering provider during after hours 7P -7A, for this patient?  1. Check the care team in [facility] and look for a) attending/consulting [facility] provider listed and b) the [facility] team listed 2. Log into [email or web address] and use [place]'s universal password to access. If you do not have the password, please contact the hospital operator. 3. Locate the [facility] provider you are looking for under [facility] and page to a number that you can be directly reached. 4. If you still have difficulty reaching the provider, please page the [facility] (Director on Call) for the Hospitalists listed on amion for assistance.

## 2020-04-27 NOTE — Progress Notes (Addendum)
  Progress Note    04/27/2020 7:32 AM * No surgery date entered *  Subjective:  Minimal pain this morning   Vitals:   04/26/20 2014 04/27/20 0427  BP: (!) 156/49 (!) 146/46  Pulse: (!) 55 (!) 58  Resp: 16 16  Temp: 97.7 F (36.5 C) 97.7 F (36.5 C)  SpO2: 92% 93%    Physical Exam: Cardiac:  RRR Lungs:  nonlabored resp Extremities:  Both feet are warm. Intact sensation and motor function. Left 4th toenail bed dry  ABIs:  Right: 0.46 biphasic waveforms. GT pressure: 25 Left: 0.20 monophasic waveforms. GT pressure: 0  CBC    Component Value Date/Time   WBC 6.7 04/27/2020 0135   RBC 4.06 04/27/2020 0135   HGB 12.6 04/27/2020 0135   HCT 37.6 04/27/2020 0135   PLT 166 04/27/2020 0135   MCV 92.6 04/27/2020 0135   MCH 31.0 04/27/2020 0135   MCHC 33.5 04/27/2020 0135   RDW 12.6 04/27/2020 0135   LYMPHSABS 1.3 04/25/2020 0345   MONOABS 1.1 (H) 04/25/2020 0345   EOSABS 0.1 04/25/2020 0345   BASOSABS 0.0 04/25/2020 0345    BMET    Component Value Date/Time   NA 137 04/27/2020 0135   K 4.2 04/27/2020 0135   CL 106 04/27/2020 0135   CO2 21 (L) 04/27/2020 0135   GLUCOSE 148 (H) 04/27/2020 0135   BUN 27 (H) 04/27/2020 0135   CREATININE 0.95 04/27/2020 0135   CALCIUM 8.6 (L) 04/27/2020 0135   GFRNONAA >60 04/27/2020 0135   GFRAA 52 (L) 02/18/2018 1610     Intake/Output Summary (Last 24 hours) at 04/27/2020 0732 Last data filed at 04/27/2020 0500 Gross per 24 hour  Intake 671.72 ml  Output --  Net 671.72 ml    HOSPITAL MEDICATIONS Scheduled Meds: . atorvastatin  20 mg Oral Daily  . benazepril  10 mg Oral Daily  . bupivacaine  10 mL Infiltration Once  . colchicine  0.6 mg Oral Daily  . empagliflozin  25 mg Oral Daily  . enoxaparin (LOVENOX) injection  40 mg Subcutaneous Q24H  . feeding supplement (GLUCERNA SHAKE)  237 mL Oral TID BM  . insulin aspart  0-9 Units Subcutaneous Q4H  . methylPREDNISolone acetate  40 mg Intra-articular Once  . multivitamin  with minerals  1 tablet Oral Daily   Continuous Infusions: . sodium chloride 250 mL (04/26/20 1153)  . cefTRIAXone (ROCEPHIN)  IV 2 g (04/26/20 1154)  . vancomycin 750 mg (04/26/20 2213)   PRN Meds:.sodium chloride, acetaminophen **OR** acetaminophen, ondansetron **OR** ondansetron (ZOFRAN) IV  Assessment: 69 year old diabetic female with abnormal ABIs. Left critical limb ischemia. Tentative plan for aortogram with bilateral LE runoff tomorrow.    Plan: -NPO after MN tonight -DVT prophylaxis:  Lovenox   Wendi Maya, PA-C [facility] [phone] 04/27/2020  7:32 AM   Plan for angiogram tomorrow.  N.p.o. after midnight.  Durene Cal

## 2020-04-27 NOTE — Plan of Care (Signed)

## 2020-04-27 NOTE — Progress Notes (Signed)
Name: Holly Stevenson MRN: [id number] DOB: [birth year]    ADMISSION DATE:  04/24/2020 CONSULTATION DATE: 04/24/2020  REFERRING MD : [facility]  CHIEF COMPLAINT: Left foot knee pain  BRIEF PATIENT DESCRIPTION:  69 year old female whose chief complaint is left lower extremity pain  SIGNIFICANT EVENTS    STUDIES:  CT of the chest is noted 2D echo ordered 11/8>> LV EF 60-65%, grade 1 diastolic dysfunction, normal RV size and function, no evidence of valvular disease or endocarditis QuantiFERON gold 11/9 >>neg 11-12 lower ext arteriogram>>   HISTORY OF PRESENT ILLNESS:   69 year old female with a known history of diabetes mellitus on oral agents, current smoker, hypertension and high cholesterol who recently had her left fourth toe nail removed per primary care physician for being loose. She presents to the emergency room with left foot pain and left knee pain that leaves her incapable of walking. She had a CT of the chest which revealed left upper lobe cavitary lesion debris in the bronchus. She is currently being worked up for tuberculosis with QuantiFERON gold. Pulmonary critical care is been asked to evaluate for cavitary lesion.    SUBJECTIVE:   No acute respiratory distress. Being worked up for critical left lower limb extremity ischemia.   VITAL SIGNS: Temp:  [97.7 F (36.5 C)-98.3 F (36.8 C)] 98.3 F (36.8 C) (11/11 0953) Pulse Rate:  [52-58] 52 (11/11 0953) Resp:  [16-18] 18 (11/11 0953) BP: (140-156)/(46-54) 140/54 (11/11 0953) SpO2:  [92 %-95 %] 95 % (11/11 0953)  PHYSICAL EXAMINATION: General: 69 year old female no acute distress HEENT: No JVD or lymphadenopathy Neuro: Grossly intact without focal defect CV: Heart sounds regular rate rhythm PULM: Clear to auscultation on room air GI: soft, bsx4 active   Extremities: Left lower extremity fourth toe with necrotic area for nail removal. Poor blood flow. Skin: no rashes or lesions   Recent Labs  Lab  04/25/20 0357 04/26/20 0152 04/27/20 0135  NA 136 136 137  K 4.1 3.4* 4.2  CL 101 102 106  CO2  --  23 21*  BUN 46* 31* 27*  CREATININE 1.20* 0.81 0.95  GLUCOSE 213* 117* 148*   Recent Labs  Lab 04/25/20 0345 04/25/20 0345 04/25/20 0357 04/26/20 0152 04/27/20 0135  HGB 15.0   < > 15.6* 12.9 12.6  HCT 45.8   < > 46.0 38.7 37.6  WBC 10.0  --   --  8.5 6.7  PLT 228  --   --  181 166   < > = values in this interval not displayed.   VAS [place] ABI WITH/WO TBI  Result Date: 04/27/2020 LOWER EXTREMITY DOPPLER STUDY Indications: Claudication, rest pain, ulceration, and Left 3rd Toe ulcer. High Risk Factors: Hypertension, Diabetes.  Performing Technologist: Jannet Askew RCT RDMS  Examination Guidelines: A complete evaluation includes at minimum, Doppler waveform signals and systolic blood pressure reading at the level of bilateral brachial, anterior tibial, and posterior tibial arteries, when vessel segments are accessible. Bilateral testing is considered an integral part of a complete examination. Photoelectric Plethysmograph (PPG) waveforms and toe systolic pressure readings are included as required and additional duplex testing as needed. Limited examinations for reoccurring indications may be performed as noted.  ABI Findings: +---------+------------------+-----+---------+--------+ Right    Rt Pressure (mmHg)IndexWaveform Comment  +---------+------------------+-----+---------+--------+ Brachial 184                    triphasic         +---------+------------------+-----+---------+--------+ PTA      85  0.46 biphasic          +---------+------------------+-----+---------+--------+ DP       82                0.45 biphasic          +---------+------------------+-----+---------+--------+ Great Toe25                0.14 Abnormal          +---------+------------------+-----+---------+--------+ +---------+------------------+-----+-------------------+-------+  Left     Lt Pressure (mmHg)IndexWaveform           Comment +---------+------------------+-----+-------------------+-------+ Brachial 180                    triphasic                  +---------+------------------+-----+-------------------+-------+ PTA      37                0.20 dampened monophasic        +---------+------------------+-----+-------------------+-------+ DP       17                0.09 dampened monophasic        +---------+------------------+-----+-------------------+-------+ Great Toe0                 0.00 Absent                     +---------+------------------+-----+-------------------+-------+ TOES Findings: +----------+---------------+--------+-------+ Right ToesPressure (mmHg)WaveformComment +----------+---------------+--------+-------+ 1st Digit 25             Abnormal        +----------+---------------+--------+-------+  +---------+---------------+--------+-------+ Left ToesPressure (mmHg)WaveformComment +---------+---------------+--------+-------+ 1st Digit0              Absent          +---------+---------------+--------+-------+    Summary: Right: Resting right ankle-brachial index indicates severe right lower extremity arterial disease. Critical Toe Pressure. Left: Resting left ankle-brachial index indicates critical left limb ischemia. Critical Left Leg and Toe Pressure.  *See table(s) above for measurements and observations.  Electronically signed by Coral Else MD on 04/27/2020 at 7:32:17 AM.    Final    ECHOCARDIOGRAM COMPLETE  Result Date: 04/25/2020    ECHOCARDIOGRAM REPORT   Patient Name:   Holly Stevenson Date of Exam: 04/25/2020 Medical Rec #:  [id number]             Height:       67.0 in Accession #:    [id number]            Weight:       175.0 lb Date of Birth:  Jan 21, 1951             BSA:          1.911 m Patient Age:    69 years              BP:           140/51 mmHg Patient Gender: F                     HR:            69 bpm. Exam Location:  Inpatient Procedure: 2D Echo Indications:    Bacteremia R78.81  History:        Patient has no prior history of Echocardiogram examinations.                 Risk Factors:Hypertension and Diabetes.  Sonographer:  Thurman Coyer RDCS (AE) Referring Phys: [id number] Chrissie Noa S Charles Andringa IMPRESSIONS  1. Left ventricular ejection fraction, by estimation, is 60 to 65%. The left ventricle has normal function. The left ventricle has no regional wall motion abnormalities. Left ventricular diastolic parameters are consistent with Grade I diastolic dysfunction (impaired relaxation).  2. Right ventricular systolic function is normal. The right ventricular size is normal.  3. The mitral valve is normal in structure. No evidence of mitral valve regurgitation. No evidence of mitral stenosis.  4. The aortic valve is normal in structure. Aortic valve regurgitation is not visualized. No aortic stenosis is present.  5. The inferior vena cava is normal in size with greater than 50% respiratory variability, suggesting right atrial pressure of 3 mmHg. Conclusion(s)/Recommendation(s): No evidence of valvular vegetations on this transthoracic echocardiogram. Would recommend a transesophageal echocardiogram to exclude infective endocarditis if clinically indicated. FINDINGS  Left Ventricle: Left ventricular ejection fraction, by estimation, is 60 to 65%. The left ventricle has normal function. The left ventricle has no regional wall motion abnormalities. The left ventricular internal cavity size was normal in size. There is  no left ventricular hypertrophy. Left ventricular diastolic parameters are consistent with Grade I diastolic dysfunction (impaired relaxation). Indeterminate filling pressures. Right Ventricle: The right ventricular size is normal. No increase in right ventricular wall thickness. Right ventricular systolic function is normal. Left Atrium: Left atrial size was normal in size. Right Atrium: Right atrial  size was normal in size. Pericardium: There is no evidence of pericardial effusion. Mitral Valve: The mitral valve is normal in structure. No evidence of mitral valve regurgitation. No evidence of mitral valve stenosis. Tricuspid Valve: The tricuspid valve is normal in structure. Tricuspid valve regurgitation is not demonstrated. No evidence of tricuspid stenosis. Aortic Valve: The aortic valve is normal in structure. Aortic valve regurgitation is not visualized. No aortic stenosis is present. Pulmonic Valve: The pulmonic valve was normal in structure. Pulmonic valve regurgitation is not visualized. No evidence of pulmonic stenosis. Aorta: The aortic root is normal in size and structure. Venous: The inferior vena cava is normal in size with greater than 50% respiratory variability, suggesting right atrial pressure of 3 mmHg. IAS/Shunts: No atrial level shunt detected by color flow Doppler.  LEFT VENTRICLE PLAX 2D LVIDd:         3.60 cm  Diastology LVIDs:         2.30 cm  LV e' medial:    5.22 cm/s LV PW:         0.90 cm  LV E/e' medial:  11.9 LV IVS:        0.90 cm  LV e' lateral:   4.90 cm/s LVOT diam:     2.00 cm  LV E/e' lateral: 12.7 LV SV:         78 LV SV Index:   41 LVOT Area:     3.14 cm  RIGHT VENTRICLE RV S prime:     13.10 cm/s TAPSE (M-mode): 1.2 cm LEFT ATRIUM             Index LA diam:        3.30 cm 1.73 cm/m LA Vol (A2C):   29.8 ml 15.60 ml/m LA Vol (A4C):   20.6 ml 10.78 ml/m LA Biplane Vol: 25.1 ml 13.14 ml/m  AORTIC VALVE LVOT Vmax:   140.00 cm/s LVOT Vmean:  93.100 cm/s LVOT VTI:    0.248 m  AORTA Ao Root diam: 2.50 cm MITRAL VALVE MV Area (PHT): 1.93 cm  SHUNTS MV Decel Time: 394 msec    Systemic VTI:  0.25 m MV E velocity: 62.10 cm/s  Systemic Diam: 2.00 cm MV A velocity: 97.50 cm/s MV E/A ratio:  0.64 Mihai Croitoru MD Electronically signed by Thurmon Fair MD Signature Date/Time: 04/25/2020/3:22:05 PM    Final    VAS [place] LOWER EXTREMITY VENOUS (DVT)  Result Date: 04/27/2020  Lower  Venous DVT Study Indications: Pain.  Risk Factors: + Covid. Performing Technologist: Jannet Askew RCT RDMS  Examination Guidelines: A complete evaluation includes B-mode imaging, spectral Doppler, color Doppler, and power Doppler as needed of all accessible portions of each vessel. Bilateral testing is considered an integral part of a complete examination. Limited examinations for reoccurring indications may be performed as noted. The reflux portion of the exam is performed with the patient in reverse Trendelenburg.  +---------+---------------+---------+-----------+----------+--------------+ RIGHT    CompressibilityPhasicitySpontaneityPropertiesThrombus Aging +---------+---------------+---------+-----------+----------+--------------+ CFV      Full           Yes      Yes                                 +---------+---------------+---------+-----------+----------+--------------+ SFJ      Full                                                        +---------+---------------+---------+-----------+----------+--------------+ FV Prox  Full                                                        +---------+---------------+---------+-----------+----------+--------------+ FV Mid   Full                                                        +---------+---------------+---------+-----------+----------+--------------+ FV DistalFull                                                        +---------+---------------+---------+-----------+----------+--------------+ PFV      Full                                                        +---------+---------------+---------+-----------+----------+--------------+ POP      Full           Yes      Yes                                 +---------+---------------+---------+-----------+----------+--------------+ PTV      Full                                                         +---------+---------------+---------+-----------+----------+--------------+  PERO     Full                                                        +---------+---------------+---------+-----------+----------+--------------+   +---------+---------------+---------+-----------+----------+--------------+ LEFT     CompressibilityPhasicitySpontaneityPropertiesThrombus Aging +---------+---------------+---------+-----------+----------+--------------+ CFV      Full           Yes      Yes                                 +---------+---------------+---------+-----------+----------+--------------+ SFJ      Full                                                        +---------+---------------+---------+-----------+----------+--------------+ FV Prox  Full                                                        +---------+---------------+---------+-----------+----------+--------------+ FV Mid   Full                                                        +---------+---------------+---------+-----------+----------+--------------+ FV DistalFull                                                        +---------+---------------+---------+-----------+----------+--------------+ PFV      Full                                                        +---------+---------------+---------+-----------+----------+--------------+ POP      Full           Yes      Yes                                 +---------+---------------+---------+-----------+----------+--------------+ PTV      Full                                                        +---------+---------------+---------+-----------+----------+--------------+ PERO     Full                                                        +---------+---------------+---------+-----------+----------+--------------+  Summary: RIGHT: - There is no evidence of deep vein thrombosis in the lower extremity.  - No cystic structure found in  the popliteal fossa.  LEFT: - There is no evidence of deep vein thrombosis in the lower extremity.  - No cystic structure found in the popliteal fossa.  *See table(s) above for measurements and observations. Electronically signed by Coral ElseVance Brabham MD on 04/27/2020 at 7:31:14 AM.    Final     ASSESSMENT: Principal Problem:    Cavitary lesion of lung left upper lobe   Diabetic infection of left foot (HCC)? Bacteremia Left limb ischemia Active Problems:   DM2 (diabetes mellitus, type 2) (HCC)   CKD (chronic kidney disease) stage 3, GFR 30-59 ml/min (HCC)   HTN (hypertension)  Discussion: 69 year old female, lifelong smoker, but no diabetes mellitus with poor control only on oral agents. She denies fevers chills sputum production shortness of breath. [facility] ostensibly for left foot pain left knee pain. She recently had her fourth toe nail removed on the left foot. She has obvious infection and questionable bacteremia on her left foot and now has left knee pain the left knee that is hot swollen and painful. She had a CT of the chest which was negative for PE but did demonstrate a left upper lobe cavitary lesion with debris in the mainstem bronchus. Pulmonary critical care has been called to evaluate. She was examined found to be in no acute distress other than left foot pain and left knee pain. She denies shortness of breath or changes in her pulmonary functions. He does smoke on a daily basis she has been counseled to stop. Her only past medical history is diabetes mellitus, high cholesterol, and hypertension.  In the absence of any cough, fevers, chills, sweats suspect that this is not a lung abscess or focal pneumonia.  More suspicious based on the clinical history that this represents malignancy.  I explained this to her today.  Discussed the pros and cons of navigational bronchoscopy to achieve a tissue diagnosis.  I recommended that we pursue ENB once her  active hospital issues including her lower extremity infection, possible thrombophlebitis (currently on heparin), possible knee joint infection, can be addressed.  We can arrange for navigational bronchoscopy as an outpatient.  She prefers 05/09/2020 and I have arranged.(per Dr. Delton CoombesByrum)  Plans: She is scheduled for possible bilateral arteriogram lower extremities due to left limb critical ischemia on 04/28/2020 per vein and vascular surgery.  QuantiFERON gold is negative therefore TB is less likely.  Dr. Delton CoombesByrum has arranged for navigational bronchoscopy on 05/09/2020 if she undergoes femoral-popliteal surgery may have to make changes to bronchoscopy if she is status post surgery.  Little for pulmonary critical care to offer at this time and therefore will be available as needed.    -Continue vascular evaluation, antibiotics as ordered -Doubt TB but agree with isolation until QuantiFERON gold result obtained.  Suspicion is low and I do not believe she needs bronchoscopy for AFB sampling as long as the QuantiFERON gold is negative -I have set her up for navigational bronchoscopy on 11/23 as an outpatient.  Questions answered.  Risk and benefits discussed.   Brett CanalesSteve Jacobus Colvin ACNP Acute Care Nurse Practitioner [facility] Please consult Amion 04/27/2020, 10:44 AM

## 2020-04-27 NOTE — Progress Notes (Signed)
Pt wound care performed on fourth left toe. Pt tolerated well. No pain at this time.

## 2020-04-28 ENCOUNTER — Encounter (HOSPITAL_COMMUNITY)
Admission: EM | Disposition: A | Discharge status: Home / Self Care | Payer: Self-pay | Source: Home / Self Care | Attending: Family Medicine

## 2020-04-28 HISTORY — PX: PERIPHERAL VASCULAR BALLOON ANGIOPLASTY: CATH118281

## 2020-04-28 HISTORY — PX: ABDOMINAL AORTOGRAM W/LOWER EXTREMITY: CATH118223

## 2020-04-28 LAB — CBC
HCT: 39 % (ref 36.0–46.0)
Hemoglobin: 13.2 g/dL (ref 12.0–15.0)
MCH: 31.1 pg (ref 26.0–34.0)
MCHC: 33.8 g/dL (ref 30.0–36.0)
MCV: 92 fL (ref 80.0–100.0)
Platelets: 179 10*3/uL (ref 150–400)
RBC: 4.24 MIL/uL (ref 3.87–5.11)
RDW: 12.4 % (ref 11.5–15.5)
WBC: 6.2 10*3/uL (ref 4.0–10.5)
nRBC: 0 % (ref 0.0–0.2)

## 2020-04-28 LAB — BASIC METABOLIC PANEL
Anion gap: 9 (ref 5–15)
BUN: 20 mg/dL (ref 8–23)
CO2: 23 mmol/L (ref 22–32)
Calcium: 9.2 mg/dL (ref 8.9–10.3)
Chloride: 105 mmol/L (ref 98–111)
Creatinine, Ser: 0.77 mg/dL (ref 0.44–1.00)
GFR, Estimated: >60 mL/min (ref >60)
Glucose, Bld: 149 mg/dL — ABNORMAL HIGH (ref 70–99)
Potassium: 4.3 mmol/L (ref 3.5–5.1)
Sodium: 137 mmol/L (ref 135–145)

## 2020-04-28 LAB — BODY FLUID CULTURE: Culture: NO GROWTH

## 2020-04-28 LAB — POCT ACTIVATED CLOTTING TIME
Activated Clotting Time: 230 seconds
Activated Clotting Time: 175 seconds

## 2020-04-28 LAB — GLUCOSE, CAPILLARY
Glucose-Capillary: 104 mg/dL — ABNORMAL HIGH (ref 70–99)
Glucose-Capillary: 105 mg/dL — ABNORMAL HIGH (ref 70–99)
Glucose-Capillary: 108 mg/dL — ABNORMAL HIGH (ref 70–99)
Glucose-Capillary: 144 mg/dL — ABNORMAL HIGH (ref 70–99)
Glucose-Capillary: 146 mg/dL — ABNORMAL HIGH (ref 70–99)
Glucose-Capillary: 212 mg/dL — ABNORMAL HIGH (ref 70–99)

## 2020-04-28 SURGERY — ABDOMINAL AORTOGRAM W/LOWER EXTREMITY
Anesthesia: LOCAL | Laterality: Left

## 2020-04-28 MED ORDER — HEPARIN (PORCINE) IN NACL 1000-0.9 UT/500ML-% IV SOLN
INTRAVENOUS | Status: DC | PRN
  Administered 2020-04-28 (×2): 500 mL

## 2020-04-28 MED ORDER — IODIXANOL 320 MG/ML IV SOLN
INTRAVENOUS | Status: DC | PRN
  Administered 2020-04-28: 130 mL via INTRA_ARTERIAL

## 2020-04-28 MED ORDER — SODIUM CHLORIDE 0.9 % IV SOLN: INTRAVENOUS | Status: AC

## 2020-04-28 MED ORDER — MORPHINE SULFATE (PF) 2 MG/ML IV SOLN: 2.0000 mg | INTRAVENOUS | Status: DC | PRN

## 2020-04-28 MED ORDER — ACETAMINOPHEN 325 MG PO TABS: 650.0000 mg | ORAL_TABLET | ORAL | Status: DC | PRN

## 2020-04-28 MED ORDER — SODIUM CHLORIDE 0.9 % IV SOLN: 250.0000 mL | INTRAVENOUS | Status: DC | PRN

## 2020-04-28 MED ORDER — HEPARIN SODIUM (PORCINE) 1000 UNIT/ML IJ SOLN
INTRAMUSCULAR | Status: AC
  Filled 2020-04-28: qty 1

## 2020-04-28 MED ORDER — HEPARIN SODIUM (PORCINE) 1000 UNIT/ML IJ SOLN
INTRAMUSCULAR | Status: DC | PRN
  Administered 2020-04-28: 8000 [IU] via INTRAVENOUS

## 2020-04-28 MED ORDER — SODIUM CHLORIDE 0.9% FLUSH: 3.0000 mL | INTRAVENOUS | Status: DC | PRN

## 2020-04-28 MED ORDER — SODIUM CHLORIDE 0.9% FLUSH
3.0000 mL | Freq: Two times a day (BID) | INTRAVENOUS | Status: DC
  Administered 2020-04-28 – 2020-04-29 (×2): 3 mL via INTRAVENOUS

## 2020-04-28 MED ORDER — SODIUM CHLORIDE 0.9 % IV SOLN: INTRAVENOUS | Status: DC

## 2020-04-28 MED ORDER — HYDRALAZINE HCL 20 MG/ML IJ SOLN
INTRAMUSCULAR | Status: AC
  Filled 2020-04-28: qty 1

## 2020-04-28 MED ORDER — OXYCODONE HCL 5 MG PO TABS
5.0000 mg | ORAL_TABLET | ORAL | Status: DC | PRN
  Administered 2020-04-28: 5 mg via ORAL
  Filled 2020-04-28: qty 1

## 2020-04-28 MED ORDER — ONDANSETRON HCL 4 MG/2ML IJ SOLN: 4.0000 mg | Freq: Four times a day (QID) | INTRAMUSCULAR | Status: DC | PRN

## 2020-04-28 MED ORDER — LABETALOL HCL 5 MG/ML IV SOLN: 10.0000 mg | INTRAVENOUS | Status: DC | PRN

## 2020-04-28 MED ORDER — LIDOCAINE HCL (PF) 1 % IJ SOLN
INTRAMUSCULAR | Status: DC | PRN
  Administered 2020-04-28 (×2): 15 mL via INTRADERMAL

## 2020-04-28 MED ORDER — HEPARIN (PORCINE) IN NACL 1000-0.9 UT/500ML-% IV SOLN
INTRAVENOUS | Status: AC
  Filled 2020-04-28: qty 1000

## 2020-04-28 MED ORDER — HYDRALAZINE HCL 20 MG/ML IJ SOLN
5.0000 mg | INTRAMUSCULAR | Status: AC | PRN
  Administered 2020-04-28 (×3): 5 mg via INTRAVENOUS

## 2020-04-28 MED ORDER — LIDOCAINE HCL (PF) 1 % IJ SOLN
INTRAMUSCULAR | Status: AC
  Filled 2020-04-28: qty 30

## 2020-04-28 SURGICAL SUPPLY — 14 items
CATH ANGIO 5F BER2 65CM (CATHETERS) ×1 IMPLANT
CATH ANGIO 5F PIGTAIL 65CM (CATHETERS) ×1 IMPLANT
CATH STRAIGHT 5FR 65CM (CATHETERS) ×1 IMPLANT
DEVICE CONTINUOUS FLUSH (MISCELLANEOUS) ×1 IMPLANT
GUIDEWIRE ANGLED .035X150CM (WIRE) ×1 IMPLANT
KIT PV (KITS) ×3 IMPLANT
SHEATH PINNACLE 5F 10CM (SHEATH) ×2 IMPLANT
SHEATH PROBE COVER 6X72 (BAG) ×1 IMPLANT
SYR MEDRAD MARK V 150ML (SYRINGE) ×1 IMPLANT
TRANSDUCER W/STOPCOCK (MISCELLANEOUS) ×3 IMPLANT
TRAY PV CATH (CUSTOM PROCEDURE TRAY) ×3 IMPLANT
TUBING ART PRESS 72  MALE/FEM (TUBING) ×1
TUBING ART PRESS 72 MALE/FEM (TUBING) IMPLANT
WIRE HITORQ VERSACORE ST 145CM (WIRE) ×1 IMPLANT

## 2020-04-28 NOTE — Progress Notes (Signed)
Site area: rt fem art 20fr and lt fem art 39fr Site Prior to Removal:  Level 0 Pressure Applied For:33min Manual:   yes Patient Status During Pull:  A/O Post Pull Site:  Level O Post Pull Instructions Given: yes, pt has been informed and understands   Post Pull Pulses Present: Rt dp/pt doppler///Lt pt doppler Dressing Applied:  tegaderm and a 4x4 Bedrest begins @ 15:45:00 Comments:Pt leaves cath lab holding area in stable condition. Rt groin and lt groin  unremarkable. Dressing is CDI.

## 2020-04-28 NOTE — Progress Notes (Signed)
PROGRESS NOTE  Holly Stevenson KVQ:[id number] DOB: 12/22/1950 DOA: 04/24/2020 PCP: Maurice Small, MD  Brief History   69 year old woman PMH including diabetes mellitus type 2, smoker, had left fourth toenail removed several weeks prior, presented with swelling and pain of the fourth and fifth toe.  Also reported tenderness left knee.  Admitted for left foot diabetic cellulitis without evidence of sepsis.  Seen by orthopedics for evaluation of left knee and left foot.  Left knee underwent arthrocentesis, orthopedics suspected gout and started colchicine, patient has clinically improved.  Treated with antibiotics for left foot.  Had ABI which was abnormal.  Seen by vascular surgery plan for angiogram.  Also noted to have pulmonary cavitation, seen by pulmonology with plans for outpatient work-up.  Pt is to go to cath lab today with Dr. Darrick Penna for abdominal arteriogram of bilateral lower extremities.  Consultants  . Vein and Vascular surgery. . Orthopedic surgery . Wound Care . Pulmonology  Procedures  Abdominal aortogram bilateral lower extremity runoff, ultrasound bilateral groin, retrograde puncture bilateral common femoral artery, attempted recanalization left common iliac artery.  Antibiotics   Anti-infectives (From admission, onward)   Start     Dose/Rate Route Frequency Ordered Stop   04/25/20 2300  vancomycin (VANCOREADY) IVPB 750 mg/150 mL  Status:  Discontinued        750 mg 150 mL/hr over 60 Minutes Intravenous Every 12 hours 04/25/20 1025 04/27/20 1615   04/25/20 1030  vancomycin (VANCOREADY) IVPB 500 mg/100 mL        500 mg 100 mL/hr over 60 Minutes Intravenous  Once 04/25/20 1025 04/25/20 1315   04/25/20 0600  [MAR Hold]  cefTRIAXone (ROCEPHIN) 2 g in sodium chloride 0.9 % 100 mL IVPB        (MAR Hold since Fri 04/28/2020 at 1259.Hold Reason: Transfer to a Procedural area.)  "And" Linked Group Details   2 g 200 mL/hr over 30 Minutes Intravenous Daily 04/25/20 0439       04/25/20 0600  metroNIDAZOLE (FLAGYL) tablet 500 mg  Status:  Discontinued       "And" Linked Group Details   500 mg Oral Every 8 hours 04/25/20 0439 04/25/20 1020   04/25/20 0415  vancomycin (VANCOCIN) IVPB 1000 mg/200 mL premix        1,000 mg 200 mL/hr over 60 Minutes Intravenous  Once 04/25/20 0403 04/25/20 0632   04/25/20 0415  piperacillin-tazobactam (ZOSYN) IVPB 3.375 g  Status:  Discontinued        3.375 g 100 mL/hr over 30 Minutes Intravenous  Once 04/25/20 0403 04/25/20 0445    .  Subjective  The patient is sitting up in chair at bedside. No new complaints.  Objective   Vitals:  Vitals:   04/28/20 1448 04/28/20 1511  BP: (!) 229/86 (!) 206/45  Pulse:    Resp:    Temp:    SpO2:      Exam:  Constitutional:  . The patient is awake, alert, and oriented x 3. No acute distress. Respiratory:  . No increased work of breathing. . No wheezes, rales, or rhonchi . No tactile fremitus Cardiovascular:  . Regular rate and rhythm . No murmurs, ectopy, or gallups. . No lateral PMI. No thrills. Abdomen:  . Abdomen is soft, non-tender, non-distended . No hernias, masses, or organomegaly . Normoactive bowel sounds.  Musculoskeletal:  . No cyanosis, clubbing, or edema Skin:  . No rashes, lesions, ulcers . palpation of skin: no induration or nodules Neurologic:  . CN 2-12  intact . Sensation all 4 extremities intact Psychiatric:  . Mental status o Mood, affect appropriate o Orientation to person, place, time  . judgment and insight appear intact  I have personally reviewed the following:   Today's Data  . Vitals, CBC  Micro Data  . Blood cultues x 2, no growth  Imaging  . CTA chest: Right upper lobe cavitary lesion . X-ray of left foot and left tibia/fibula  Cardiology Data  . EKG . Echocardiogram  Scheduled Meds: . [MAR Hold] atorvastatin  20 mg Oral Daily  . [MAR Hold] benazepril  10 mg Oral Daily  . [MAR Hold] bupivacaine  10 mL Infiltration Once   . [MAR Hold] colchicine  0.6 mg Oral Daily  . [MAR Hold] empagliflozin  25 mg Oral Daily  . [MAR Hold] enoxaparin (LOVENOX) injection  40 mg Subcutaneous Q24H  . [MAR Hold] feeding supplement (GLUCERNA SHAKE)  237 mL Oral TID BM  . [MAR Hold] insulin aspart  0-9 Units Subcutaneous Q4H  . [MAR Hold] methylPREDNISolone acetate  40 mg Intra-articular Once  . [MAR Hold] multivitamin with minerals  1 tablet Oral Daily   Continuous Infusions: . [MAR Hold] sodium chloride Stopped (04/28/20 0953)  . sodium chloride 100 mL/hr at 04/28/20 1200  . sodium chloride 125 mL/hr at 04/28/20 1442  . [MAR Hold] cefTRIAXone (ROCEPHIN)  IV Stopped (04/28/20 1025)    Principal Problem:   Critical lower limb ischemia (HCC) Active Problems:   Diabetic infection of left foot (HCC)   DM2 (diabetes mellitus, type 2) (HCC)   CKD (chronic kidney disease) stage 3, GFR 30-59 ml/min (HCC)   HTN (hypertension)   Cavitary lesion of lung   Cellulitis of toe of left foot   Idiopathic chronic gout of left knee without tophus   Gangrene of toe of left foot (HCC)   PAD (peripheral artery disease) (HCC)   LOS: 3 days   A & P  Left foot diabetic cellulitis without evidence of sepsis, complicated by suspected arterial disease: Improving w/ abx. Will convert to oral antibiotics today. Stop vanc as MRSA PCR was negative.  Critical limb ischemia left leg: Arteriogram of both lower extremities today with [facility] due to abnormal ABI's.  No need for heparin per [facility].  Left knee pain: Improved today per patient. Pt is s/p arthrocentesis, gout suspected by ortho, no crystals on cytology, but clinically improved with colchicine. Culture NGTD. Further management per orthopedics.  Pulmonary cavitation: Etiology unclear, low risk for TB, QF gold negative. Airborne precautions discontinued.  The patient will follow up with pulmonology as outpatient for investigative bronchoscopy.  DM type 2: Hold metformin: The patient is  receiving FSBS/SSI and Jardiance. Glucoses for the last 24 hours have been 104-146 over the last 24 hours. Continue to monitor.  I have seen and examined this patient myself. I have spent 34 minutes in her evaluation and care.  DVT prophylaxis: enoxaparin   Code Status: Full Code Family Communication: none  Disposition Plan:  Discussion: Continue antibiotics, f/u angiogram tomorrow. Home when cleared by [facility].  Status is: Inpatient  Remains inpatient appropriate because:Inpatient level of care appropriate due to severity of illness  Dispo: The patient is from: Home  Anticipated d/c is to: Home  Anticipated d/c date is: 2 days  Patient currently is not medically stable to d/c.  Kayli Beal, DO [facility] Direct contact: see [email or web address]  7PM-7AM contact night coverage as above 04/28/2020, 3:24 PM  LOS: 3 days

## 2020-04-28 NOTE — Op Note (Addendum)
Procedure: Abdominal aortogram bilateral lower extremity runoff, ultrasound bilateral groin, retrograde puncture bilateral common femoral artery, attempted recanalization left common iliac artery  Preoperative diagnosis: Nonhealing wound left foot  Postoperative diagnosis: Same  Anesthesia: Local  Operative findings: 1. 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 Bilateral flush superficial femoral artery occlusions  3. Bilateral reconstitution of the above-knee popliteal artery with three-vessel runoff, right foot dominated by the posterior tibial artery  4. 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 The patient was placed supine position the angio table. Both groins were prepped and draped in usual sterile fashion. Ultrasound was used to identify the right common femoral and femoral bifurcation. There was moderate calcification of the right common femoral artery. An introducer needle was used to cannulate the right common femoral artery and an 035 versa core wire threaded up in the abdominal aorta under fluoroscopic guidance. Next a 5 French sheath placed over the guidewire in the right common femoral artery. This was thoroughly flushed with heparinized saline. 5 French pigtail catheter was then advanced over the guidewire and abdominal aortogram standard AP projection. There is a 50% stenosis of the right renal artery. There is a 30% stenosis of the left renal artery. The infrarenal abdominal aorta is patent. Right common iliac artery is patent. The left common iliac artery is occluded just after its origin. There is reconstitution of a faint image of the left external iliac artery on delayed films.  At this point the pigtail catheter was brought down just above the aortic bifurcation and bilateral oblique views of the pelvis were  obtained. This was done to confirm the above findings. The left external iliac artery and left internal iliac artery do reconstitute via collaterals the occlusion extends from the origin of the left common iliac down to the iliac bifurcation.  Next bilateral lower extremity runoff views were obtained through the pigtail catheter.  In the left lower extremity, the left common femoral artery is patent. The left profunda femoris is patent. There is a flush occlusion of the left superficial femoral artery. There is reconstitution of the above-knee popliteal artery and three-vessel runoff to the left foot.  In the right lower extremity, the right common femoral artery is patent. There is a flush occlusion of the right superficial femoral artery. The right profunda is patent. The right above-knee popliteal artery does reconstitute via profunda collaterals. The proximal origin of all 3 tibial vessels is patent on the right side. However the right posterior tibial artery is the primary vessel that fills the right foot.  At this time I attempted to recanalize the left common iliac artery. Ultrasound was used to identify the left common femoral artery and femoral bifurcation. Local anesthesia was infiltrated over the left common femoral artery. An introducer needle was used to cannulate the left common femoral artery and an 035 versa core wire advanced into the left iliac system. A 5 French sheath was placed over this. The patient was given 8000 units of intravenous heparin. An 035 angled Glidewire was then advanced up in the left iliac system with a 5 French straight catheter for support. I was able to advance this wire all the way up to the origin of the left common iliac artery and reflux it but was unable to reenter the abdominal aorta. I then pulled this wire and catheter back and  reattempted using a 5 [place] Berenstein catheter to direct the guidewire into a different location but this continued to find the same  dissection plane. At this point I obtained a retrograde contrast angiogram of the left iliac system which showed the left external iliac and internal iliac arteries were still patent with retrograde flow. I also performed an abdominal aortogram which showed no extravasation of contrast and a flush occlusion of the left common iliac artery. At this point the procedure was concluded. The 5 French pigtail catheter was removed over guidewire. The catheter and wire was removed from the left groin. Both sheaths were left in place to pulled in the holding area. Patient tied procedure well and there were no complications. Patient was taken the holding area in stable condition.  Operative management: Dr. Myra Gianotti will review these films and determine next option most likely a femoral-femoral bypass with left femoropopliteal bypass. He will discuss this with the patient early next week.  Fabienne Bruns, MD [facility] Office: [phone]

## 2020-04-28 NOTE — Progress Notes (Signed)
Attempted to call patient's daughter regarding procedure, Jael Waldorf, [phone]. No one answered the phone voicemail was left.  Fabienne Bruns, MD [facility] [place] [facility] Office: [phone]

## 2020-04-28 NOTE — Plan of Care (Addendum)
Dressing to L foot changed per order.   Problem: Clinical Measurements: Goal: Ability to maintain clinical measurements within normal limits will improve Outcome: Progressing   Problem: Activity: Goal: Risk for activity intolerance will decrease Outcome: Progressing   Problem: Nutrition: Goal: Adequate nutrition will be maintained Outcome: Progressing   Problem: Coping: Goal: Level of anxiety will decrease Outcome: Progressing   Problem: Elimination: Goal: Will not experience complications related to bowel motility Outcome: Progressing   Problem: Pain Managment: Goal: General experience of comfort will improve Outcome: Progressing   Problem: Safety: Goal: Ability to remain free from injury will improve Outcome: Progressing   Problem: Skin Integrity: Goal: Risk for impaired skin integrity will decrease Outcome: Progressing

## 2020-04-28 NOTE — Interval H&P Note (Signed)
History and Physical Interval Note:  04/28/2020 12:09 PM  Holly Stevenson  has presented today for surgery, with the diagnosis of critical limb ischemia.  The various methods of treatment have been discussed with the patient and family. After consideration of risks, benefits and other options for treatment, the patient has consented to  Procedure(s): ABDOMINAL AORTOGRAM W/LOWER EXTREMITY (Bilateral) as a surgical intervention.  The patient's history has been reviewed, patient examined, no change in status, stable for surgery.  I have reviewed the patient's chart and labs.  Questions were answered to the patient's satisfaction.     Fabienne Bruns

## 2020-04-29 LAB — BASIC METABOLIC PANEL
Anion gap: 11 (ref 5–15)
BUN: 17 mg/dL (ref 8–23)
CO2: 23 mmol/L (ref 22–32)
Calcium: 8.8 mg/dL — ABNORMAL LOW (ref 8.9–10.3)
Chloride: 104 mmol/L (ref 98–111)
Creatinine, Ser: 0.83 mg/dL (ref 0.44–1.00)
GFR, Estimated: >60 mL/min (ref >60)
Glucose, Bld: 94 mg/dL (ref 70–99)
Potassium: 4 mmol/L (ref 3.5–5.1)
Sodium: 138 mmol/L (ref 135–145)

## 2020-04-29 LAB — CBC
HCT: 38.1 % (ref 36.0–46.0)
Hemoglobin: 13 g/dL (ref 12.0–15.0)
MCH: 31.3 pg (ref 26.0–34.0)
MCHC: 34.1 g/dL (ref 30.0–36.0)
MCV: 91.8 fL (ref 80.0–100.0)
Platelets: 197 K/uL (ref 150–400)
RBC: 4.15 MIL/uL (ref 3.87–5.11)
RDW: 12.5 % (ref 11.5–15.5)
WBC: 7 K/uL (ref 4.0–10.5)
nRBC: 0 % (ref 0.0–0.2)

## 2020-04-29 LAB — GLUCOSE, CAPILLARY
Glucose-Capillary: 108 mg/dL — ABNORMAL HIGH (ref 70–99)
Glucose-Capillary: 155 mg/dL — ABNORMAL HIGH (ref 70–99)

## 2020-04-29 LAB — CULTURE, BLOOD (ROUTINE X 2): Special Requests: ADEQUATE

## 2020-04-29 MED ORDER — CEPHALEXIN 500 MG PO CAPS: 500.0000 mg | ORAL_CAPSULE | Freq: Four times a day (QID) | ORAL | 0 refills | Status: AC

## 2020-04-29 MED ORDER — OXYCODONE HCL 5 MG PO TABS
5.0000 mg | ORAL_TABLET | ORAL | 0 refills | Status: DC | PRN
Start: 2020-04-29 — End: 2020-05-13

## 2020-04-29 MED ORDER — GLUCERNA SHAKE PO LIQD
237.0000 mL | Freq: Three times a day (TID) | ORAL | 0 refills | Status: DC
Start: 2020-04-29 — End: 2020-07-17

## 2020-04-29 NOTE — Discharge Summary (Signed)
Physician Discharge Summary  Holly Amoratricia Gayle Speranza BJY:[id number]:[id number] DOB: 04/22/1951 DOA: 04/24/2020  PCP: Maurice SmallGriffin, Elaine, MD  Admit date: 04/24/2020 Discharge date: 04/29/2020  Recommendations for Outpatient Follow-up:  1. Discharge to home with walker for ambulation. 2. Pt will be contacted by Dr. Doretha ImusBrambhatt regarding surgery on left foot/lower extremity next week. 3. Follow up with PCP in 7-10 days. 4. Do not start metformin until cleared by Dr. Doretha ImusBrambhatt. I.E. Do not start before 05/01/2020, but hold if surgery is planned for early next week. 5. Follow up with pulmonology as outpatient for bronchoscopy to investigate Rt upper lobe cavitary lesion.  Discharge Diagnoses: Principal diagnosis is #1 1. Left foot ulcer/cellulitis 2. Critical limb ischemia 3. Left knee pain due to gout 4. Left upper lobe pulmonary cavitation 5. DM II  Discharge Condition: Fair  Disposition: Home  Diet recommendation: Modified carbohydrate  Filed Weights   04/25/20 0519  Weight: 79.4 kg    History of present illness:  Holly Stevenson is a 69 y.o. female with medical history significant of DM2, HTN, smoking, HLD.  Pt had toenail on L fourth toe removed a couple of weeks ago.  Area turned black and now pt with progressive pain swelling, warmth, and tingling below the knee and now involving knee for past several days.  Symptoms are gradually worsening, nothing makes better or worse.  Denies CP or SOB, does have current wheezing, no fever.  Started on zosyn / vanc and empiric heparin for concern for thrombophlebitis of LLE and diabetic foot infection.  Hospital Course: 69 year old woman PMH including diabetes mellitus type 2, smoker, had left fourth toenail removed several weeks prior, presented with swelling and pain of the fourth and fifth toe. Also reported tenderness left knee. Admitted for left foot diabetic cellulitis without evidence of sepsis. Seen by orthopedics for evaluation of left  knee and left foot. Left knee underwent arthrocentesis, orthopedics suspected gout and started colchicine, patient has clinically improved. Treated with antibiotics for left foot. Had ABI which was abnormal. Seen by vascular surgery plan for angiogram. Also noted to have pulmonary cavitation, seen by pulmonology with plans for outpatient work-up.  Pt is to go to cath lab today with Dr. Darrick PennaFields for abdominal arteriogram of bilateral lower extremities. 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 There was 50% stenosis of the right renal artery and 30% stenosis of the left renal artery. There are no endovascular options. The plan is for a fem fem and left fem pop bypass. The patient has been cleared for discharge to home. She will be contacted by Dr. Myra GianottiBrabham to be scheduled for surgery in the next week.   Today's assessment: S: The patient is resting comfortably. No new complaints. O: Vitals:  Vitals:   04/29/20 0753 04/29/20 1125  BP: (!) 152/53 (!) 155/43  Pulse: (!) 53 60  Resp: 18 20  Temp: 98.4 F (36.9 C) 98.4 F (36.9 C)  SpO2: 95% 98%   Exam:  Constitutional:   The patient is awake, alert, and oriented x 3. No acute distress. Respiratory:   No increased work of breathing.  No wheezes, rales, or rhonchi  No tactile fremitus Cardiovascular:   Regular rate and rhythm  No murmurs, ectopy, or gallups.  No lateral PMI. No thrills. Abdomen:   Abdomen is soft, non-tender, non-distended  No hernias, masses, or organomegaly  Normoactive bowel  sounds.  Musculoskeletal:   No cyanosis, clubbing, or edema Skin:   No rashes, lesions, ulcers  palpation of skin: no induration or nodules Neurologic:   CN 2-12 intact  Sensation all 4 extremities intact Psychiatric:   Mental status ? Mood, affect appropriate ? Orientation  to person, place, time   judgment and insight appear intact  Discharge Instructions  Discharge Instructions    Activity as tolerated - No restrictions   Complete by: As directed    Call MD for:  redness, tenderness, or signs of infection (pain, swelling, redness, odor or green/yellow discharge around incision site)   Complete by: As directed    Call MD for:  severe uncontrolled pain   Complete by: As directed    Call MD for:  temperature >100.4   Complete by: As directed    Diet Carb Modified   Complete by: As directed    Discharge instructions   Complete by: As directed    Discharge to home with walker for ambulation. Pt will be contacted by Dr. Doretha Imus regarding surgery on left foot/lower extremity next week. Follow up with PCP in 7-10 days. Do not start metformin until cleared by Dr. Doretha Imus. I.E. Do not start before 05/01/2020, but hold if surgery is planned for early next week. Follow up with pulmonology as outpatient for bronchoscopy to investigate Rt upper lobe cavitary lesion.   Discharge wound care:   Complete by: As directed    Wound care to left foot, 4th and 5th digits:  Cleanse with soap and water, rinse and pat dry-taking care to dry thoroughly between toes. Paint 4th and 5th digits with betadine swabstick and allow to air-dry. When dry, place dry gauze 2x2s between between toes and secure with Kerlix roll gauze wrap/paper tape.  Change twice daily. Place left foot into [employer].   Increase activity slowly   Complete by: As directed      Allergies as of 04/29/2020   No Known Allergies     Medication List    STOP taking these medications   metFORMIN 500 MG tablet Commonly known as: GLUCOPHAGE     TAKE these medications   atorvastatin 20 MG tablet Commonly known as: LIPITOR Take 20 mg by mouth daily.   benazepril 10 MG tablet Commonly known as: LOTENSIN Take 10 mg by mouth daily.   cephALEXin 500 MG capsule Commonly known as: KEFLEX Take 1  capsule (500 mg total) by mouth 4 (four) times daily for 10 days.   cholecalciferol 25 MCG (1000 UNIT) tablet Commonly known as: VITAMIN D3 Take 1,000 Units by mouth daily.   feeding supplement (GLUCERNA SHAKE) Liqd Take 237 mLs by mouth 3 (three) times daily between meals.   Jardiance 25 MG Tabs tablet Generic drug: empagliflozin Take 25 mg by mouth daily.   multivitamin with minerals tablet Take 1 tablet by mouth daily.   oxyCODONE 5 MG immediate release tablet Commonly known as: Oxy IR/ROXICODONE Take 1-2 tablets (5-10 mg total) by mouth every 4 (four) hours as needed for moderate pain.            Discharge Care Instructions  (From admission, onward)         Start     Ordered   04/29/20 0000  Discharge wound care:       Comments: Wound care to left foot, 4th and 5th digits:  Cleanse with soap and water, rinse and pat dry-taking care to dry thoroughly between toes. Paint 4th and 5th digits with  betadine swabstick and allow to air-dry. When dry, place dry gauze 2x2s between between toes and secure with Kerlix roll gauze wrap/paper tape.  Change twice daily. Place left foot into [employer].   04/29/20 1108         No Known Allergies  The results of significant diagnostics from this hospitalization (including imaging, microbiology, ancillary and laboratory) are listed below for reference.    Significant Diagnostic Studies: DG Tibia/Fibula Left  Result Date: 04/25/2020 CLINICAL DATA:  Pain EXAM: LEFT TIBIA AND FIBULA - 2 VIEW COMPARISON:  None. FINDINGS: There is no evidence of fracture or other focal bone lesions. Soft tissues are unremarkable. IMPRESSION: Negative. Electronically Signed   By: Katherine Mantle M.D.   On: 04/25/2020 03:48   CT Angio Chest PE W and/or Wo Contrast  Result Date: 04/25/2020 CLINICAL DATA:  Chest pain or shortness of breath with pleurisy or effusion suspected. EXAM: CT ANGIOGRAPHY CHEST WITH CONTRAST TECHNIQUE: Multidetector CT imaging  of the chest was performed using the standard protocol during bolus administration of intravenous contrast. Multiplanar CT image reconstructions and MIPs were obtained to evaluate the vascular anatomy. CONTRAST:  75mL OMNIPAQUE IOHEXOL 350 MG/ML SOLN COMPARISON:  None. FINDINGS: Cardiovascular: Satisfactory opacification of the pulmonary arteries to the segmental level. No evidence of pulmonary embolism. Normal heart size. No pericardial effusion. Diffuse atheromatous calcification of the aorta and great vessels. Coronary calcifications on the left and right. Mediastinum/Nodes: Negative for adenopathy or mass. Lungs/Pleura: Cavitary lesion in the right upper lobe with irregular wall, 2.4 x 4.5 cm on coronal reformats. There is regional nodularity which is likely endobronchial spread of debris. This could be malignancy or atypical infection, including tuberculosis. The finding is known given it has already been charted in epic. Non infectious granulomatous disease is considered less likely given the solitary lesion. There are a few areas of mucoid impaction superimposed on generalized airway thickening. Centrilobular emphysema Upper Abdomen: Negative Musculoskeletal: Spondylosis.  No acute or aggressive finding. Review of the MIP images confirms the above findings. IMPRESSION: 1. Large cavitary lesion in the right upper lobe, likely either atypical infection (including tuberculosis) or malignancy. 2. Right upper lobe airspace disease that is probably endobronchial spread of debris/infection. 3. Negative for pulmonary embolism. 4. Aortic Atherosclerosis (ICD10-I70.0) and Emphysema (ICD10-J43.9). Electronically Signed   By: Marnee Spring M.D.   On: 04/25/2020 05:03   PERIPHERAL VASCULAR CATHETERIZATION  Result Date: 04/28/2020 Procedure: Abdominal aortogram bilateral lower extremity runoff, ultrasound bilateral groin, retrograde puncture bilateral common femoral artery, attempted recanalization left common iliac  artery Preoperative diagnosis: Nonhealing wound left foot Postoperative diagnosis: Same Anesthesia: Local Operative findings: 1. 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 Bilateral flush superficial femoral artery occlusions 3. Bilateral reconstitution of the above-knee popliteal artery with three-vessel runoff, right foot dominated by the posterior tibial artery 4. 50% stenosis right renal artery, 30% stenosis left renal artery Operative details: After obtaining form consent, the patient taken to the PV lab. The patient was placed supine position the angio table. Both groins were prepped and draped in usual sterile fashion. Ultrasound was used to identify the right common femoral and femoral bifurcation. There was moderate calcification of the right common femoral artery. An introducer needle was used to cannulate the right common femoral artery and an 035 versa core wire threaded up in the abdominal aorta under fluoroscopic guidance. Next a 5 French sheath placed over the guidewire in the right common femoral artery. This was  thoroughly flushed with heparinized saline. 5 French pigtail catheter was then advanced over the guidewire and abdominal aortogram standard AP projection. There is a 50% stenosis of the right renal artery. There is a 30% stenosis of the left renal artery. The infrarenal abdominal aorta is patent. Right common iliac artery is patent. The left common iliac artery is occluded just after its origin. There is reconstitution of a faint image of the left external iliac artery on delayed films. At this point the pigtail catheter was brought down just above the aortic bifurcation and bilateral oblique views of the pelvis were obtained. This was done to confirm the above findings. The left external iliac artery and left internal iliac artery do reconstitute via collaterals the occlusion extends from the origin of the left common iliac down to  the iliac bifurcation. Next bilateral lower extremity runoff views were obtained through the pigtail catheter. In the left lower extremity, the left common femoral artery is patent. The left profunda femoris is patent. There is a flush occlusion of the left superficial femoral artery. There is reconstitution of the above-knee popliteal artery and three-vessel runoff to the left foot. In the right lower extremity, the right common femoral artery is patent. There is a flush occlusion of the right superficial femoral artery. The right profunda is patent. The right above-knee popliteal artery does reconstitute via profunda collaterals. The proximal origin of all 3 tibial vessels is patent on the right side. However the right posterior tibial artery is the primary vessel that fills the right foot. At this time I attempted to recanalize the left common iliac artery. Ultrasound was used to identify the left common femoral artery and femoral bifurcation. Local anesthesia was infiltrated over the left common femoral artery. An introducer needle was used to cannulate the left common femoral artery and an 035 versa core wire advanced into the left iliac system. A 5 French sheath was placed over this. The patient was given 8000 units of intravenous heparin. An 035 angled Glidewire was then advanced up in the left iliac system with a 5 French straight catheter for support. I was able to advance this wire all the way up to the origin of the left common iliac artery and reflux it but was unable to reenter the abdominal aorta. I then pulled this wire and catheter back and reattempted using a 5 [place] Berenstein catheter to direct the guidewire into a different location but this continued to find the same dissection plane. At this point I obtained a retrograde contrast angiogram of the left iliac system which showed the left external iliac and internal iliac arteries were still patent with retrograde flow. I also performed an  abdominal aortogram which showed no extravasation of contrast and a flush occlusion of the left common iliac artery. At this point the procedure was concluded. The 5 French pigtail catheter was removed over guidewire. The catheter and wire was removed from the left groin. Both sheaths were left in place to pulled in the holding area. Patient tied procedure well and there were no complications. Patient was taken the holding area in stable condition. Operative management: Dr. Myra Gianotti will review these films and determine next option most likely a femoral-femoral bypass with left femoropopliteal bypass. He will discuss this with the patient early next week. Fabienne Bruns, MD [facility] Office: [phone]   DG Foot Complete Left  Result Date: 04/25/2020 CLINICAL DATA:  Pain in the fourth digit. EXAM: LEFT FOOT - COMPLETE  3+ VIEW COMPARISON:  None. FINDINGS: There are advanced degenerative changes of the first metatarsophalangeal joint. There is no definite acute displaced fracture or dislocation. There is no radiographic evidence for osteomyelitis. There is soft tissue swelling about the fourth digit. IMPRESSION: 1. No acute displaced fracture or dislocation. 2. Soft tissue swelling about the fourth digit. 3. Advanced degenerative changes of the first metatarsophalangeal joint. Electronically Signed   By: Katherine Mantle M.D.   On: 04/25/2020 03:49   VAS [place] ABI WITH/WO TBI  Result Date: 04/27/2020 LOWER EXTREMITY DOPPLER STUDY Indications: Claudication, rest pain, ulceration, and Left 3rd Toe ulcer. High Risk Factors: Hypertension, Diabetes.  Performing Technologist: Jannet Askew RCT RDMS  Examination Guidelines: A complete evaluation includes at minimum, Doppler waveform signals and systolic blood pressure reading at the level of bilateral brachial, anterior tibial, and posterior tibial arteries, when vessel segments are accessible. Bilateral testing is considered an  integral part of a complete examination. Photoelectric Plethysmograph (PPG) waveforms and toe systolic pressure readings are included as required and additional duplex testing as needed. Limited examinations for reoccurring indications may be performed as noted.  ABI Findings: +---------+------------------+-----+---------+--------+ Right    Rt Pressure (mmHg)IndexWaveform Comment  +---------+------------------+-----+---------+--------+ Brachial 184                    triphasic         +---------+------------------+-----+---------+--------+ PTA      85                0.46 biphasic          +---------+------------------+-----+---------+--------+ DP       82                0.45 biphasic          +---------+------------------+-----+---------+--------+ Great Toe25                0.14 Abnormal          +---------+------------------+-----+---------+--------+ +---------+------------------+-----+-------------------+-------+ Left     Lt Pressure (mmHg)IndexWaveform           Comment +---------+------------------+-----+-------------------+-------+ Brachial 180                    triphasic                  +---------+------------------+-----+-------------------+-------+ PTA      37                0.20 dampened monophasic        +---------+------------------+-----+-------------------+-------+ DP       17                0.09 dampened monophasic        +---------+------------------+-----+-------------------+-------+ Great Toe0                 0.00 Absent                     +---------+------------------+-----+-------------------+-------+ TOES Findings: +----------+---------------+--------+-------+ Right ToesPressure (mmHg)WaveformComment +----------+---------------+--------+-------+ 1st Digit 25             Abnormal        +----------+---------------+--------+-------+  +---------+---------------+--------+-------+ Left ToesPressure (mmHg)WaveformComment  +---------+---------------+--------+-------+ 1st Digit0              Absent          +---------+---------------+--------+-------+    Summary: Right: Resting right ankle-brachial index indicates severe right lower extremity arterial disease. Critical Toe Pressure. Left: Resting left ankle-brachial index indicates critical left limb ischemia. Critical Left Leg  and Toe Pressure.  *See table(s) above for measurements and observations.  Electronically signed by Coral Else MD on 04/27/2020 at 7:32:17 AM.    Final    ECHOCARDIOGRAM COMPLETE  Result Date: 04/25/2020    ECHOCARDIOGRAM REPORT   Patient Name:   YOUNIQUE CASAD Date of Exam: 04/25/2020 Medical Rec #:  [id number]             Height:       67.0 in Accession #:    [id number]            Weight:       175.0 lb Date of Birth:  02/26/1951             BSA:          1.911 m Patient Age:    69 years              BP:           140/51 mmHg Patient Gender: F                     HR:           69 bpm. Exam Location:  Inpatient Procedure: 2D Echo Indications:    Bacteremia R78.81  History:        Patient has no prior history of Echocardiogram examinations.                 Risk Factors:Hypertension and Diabetes.  Sonographer:    Thurman Coyer RDCS (AE) Referring Phys: [id number] Chrissie Noa S MINOR IMPRESSIONS  1. Left ventricular ejection fraction, by estimation, is 60 to 65%. The left ventricle has normal function. The left ventricle has no regional wall motion abnormalities. Left ventricular diastolic parameters are consistent with Grade I diastolic dysfunction (impaired relaxation).  2. Right ventricular systolic function is normal. The right ventricular size is normal.  3. The mitral valve is normal in structure. No evidence of mitral valve regurgitation. No evidence of mitral stenosis.  4. The aortic valve is normal in structure. Aortic valve regurgitation is not visualized. No aortic stenosis is present.  5. The inferior vena cava is normal in size with greater  than 50% respiratory variability, suggesting right atrial pressure of 3 mmHg. Conclusion(s)/Recommendation(s): No evidence of valvular vegetations on this transthoracic echocardiogram. Would recommend a transesophageal echocardiogram to exclude infective endocarditis if clinically indicated. FINDINGS  Left Ventricle: Left ventricular ejection fraction, by estimation, is 60 to 65%. The left ventricle has normal function. The left ventricle has no regional wall motion abnormalities. The left ventricular internal cavity size was normal in size. There is  no left ventricular hypertrophy. Left ventricular diastolic parameters are consistent with Grade I diastolic dysfunction (impaired relaxation). Indeterminate filling pressures. Right Ventricle: The right ventricular size is normal. No increase in right ventricular wall thickness. Right ventricular systolic function is normal. Left Atrium: Left atrial size was normal in size. Right Atrium: Right atrial size was normal in size. Pericardium: There is no evidence of pericardial effusion. Mitral Valve: The mitral valve is normal in structure. No evidence of mitral valve regurgitation. No evidence of mitral valve stenosis. Tricuspid Valve: The tricuspid valve is normal in structure. Tricuspid valve regurgitation is not demonstrated. No evidence of tricuspid stenosis. Aortic Valve: The aortic valve is normal in structure. Aortic valve regurgitation is not visualized. No aortic stenosis is present. Pulmonic Valve: The pulmonic valve was normal in structure. Pulmonic valve regurgitation is not visualized. No evidence of pulmonic stenosis.  Aorta: The aortic root is normal in size and structure. Venous: The inferior vena cava is normal in size with greater than 50% respiratory variability, suggesting right atrial pressure of 3 mmHg. IAS/Shunts: No atrial level shunt detected by color flow Doppler.  LEFT VENTRICLE PLAX 2D LVIDd:         3.60 cm  Diastology LVIDs:         2.30 cm   LV e' medial:    5.22 cm/s LV PW:         0.90 cm  LV E/e' medial:  11.9 LV IVS:        0.90 cm  LV e' lateral:   4.90 cm/s LVOT diam:     2.00 cm  LV E/e' lateral: 12.7 LV SV:         78 LV SV Index:   41 LVOT Area:     3.14 cm  RIGHT VENTRICLE RV S prime:     13.10 cm/s TAPSE (M-mode): 1.2 cm LEFT ATRIUM             Index LA diam:        3.30 cm 1.73 cm/m LA Vol (A2C):   29.8 ml 15.60 ml/m LA Vol (A4C):   20.6 ml 10.78 ml/m LA Biplane Vol: 25.1 ml 13.14 ml/m  AORTIC VALVE LVOT Vmax:   140.00 cm/s LVOT Vmean:  93.100 cm/s LVOT VTI:    0.248 m  AORTA Ao Root diam: 2.50 cm MITRAL VALVE MV Area (PHT): 1.93 cm    SHUNTS MV Decel Time: 394 msec    Systemic VTI:  0.25 m MV E velocity: 62.10 cm/s  Systemic Diam: 2.00 cm MV A velocity: 97.50 cm/s MV E/A ratio:  0.64 Mihai Croitoru MD Electronically signed by Thurmon Fair MD Signature Date/Time: 04/25/2020/3:22:05 PM    Final    VAS [place] LOWER EXTREMITY VENOUS (DVT)  Result Date: 04/27/2020  Lower Venous DVT Study Indications: Pain.  Risk Factors: + Covid. Performing Technologist: Jannet Askew RCT RDMS  Examination Guidelines: A complete evaluation includes B-mode imaging, spectral Doppler, color Doppler, and power Doppler as needed of all accessible portions of each vessel. Bilateral testing is considered an integral part of a complete examination. Limited examinations for reoccurring indications may be performed as noted. The reflux portion of the exam is performed with the patient in reverse Trendelenburg.  +---------+---------------+---------+-----------+----------+--------------+ RIGHT    CompressibilityPhasicitySpontaneityPropertiesThrombus Aging +---------+---------------+---------+-----------+----------+--------------+ CFV      Full           Yes      Yes                                 +---------+---------------+---------+-----------+----------+--------------+ SFJ      Full                                                         +---------+---------------+---------+-----------+----------+--------------+ FV Prox  Full                                                        +---------+---------------+---------+-----------+----------+--------------+ FV Mid   Full                                                        +---------+---------------+---------+-----------+----------+--------------+  FV DistalFull                                                        +---------+---------------+---------+-----------+----------+--------------+ PFV      Full                                                        +---------+---------------+---------+-----------+----------+--------------+ POP      Full           Yes      Yes                                 +---------+---------------+---------+-----------+----------+--------------+ PTV      Full                                                        +---------+---------------+---------+-----------+----------+--------------+ PERO     Full                                                        +---------+---------------+---------+-----------+----------+--------------+   +---------+---------------+---------+-----------+----------+--------------+ LEFT     CompressibilityPhasicitySpontaneityPropertiesThrombus Aging +---------+---------------+---------+-----------+----------+--------------+ CFV      Full           Yes      Yes                                 +---------+---------------+---------+-----------+----------+--------------+ SFJ      Full                                                        +---------+---------------+---------+-----------+----------+--------------+ FV Prox  Full                                                        +---------+---------------+---------+-----------+----------+--------------+ FV Mid   Full                                                         +---------+---------------+---------+-----------+----------+--------------+ FV DistalFull                                                        +---------+---------------+---------+-----------+----------+--------------+  PFV      Full                                                        +---------+---------------+---------+-----------+----------+--------------+ POP      Full           Yes      Yes                                 +---------+---------------+---------+-----------+----------+--------------+ PTV      Full                                                        +---------+---------------+---------+-----------+----------+--------------+ PERO     Full                                                        +---------+---------------+---------+-----------+----------+--------------+     Summary: RIGHT: - There is no evidence of deep vein thrombosis in the lower extremity.  - No cystic structure found in the popliteal fossa.  LEFT: - There is no evidence of deep vein thrombosis in the lower extremity.  - No cystic structure found in the popliteal fossa.  *See table(s) above for measurements and observations. Electronically signed by Coral Else MD on 04/27/2020 at 7:31:14 AM.    Final     Microbiology: Recent Results (from the past 240 hour(s))  Respiratory Panel by RT PCR (Flu A&B, Covid) - Nasopharyngeal Swab     Status: None   Collection Time: 04/25/20  3:45 AM   Specimen: Nasopharyngeal Swab  Result Value Ref Range Status   SARS Coronavirus 2 by RT PCR NEGATIVE NEGATIVE Final    Comment: (NOTE) SARS-CoV-2 target nucleic acids are NOT DETECTED.  The SARS-CoV-2 RNA is generally detectable in upper respiratoy specimens during the acute phase of infection. The lowest concentration of SARS-CoV-2 viral copies this assay can detect is 131 copies/mL. A negative result does not preclude SARS-Cov-2 infection and should not be used as the sole basis for treatment  or other patient management decisions. A negative result may occur with  improper specimen collection/handling, submission of specimen other than nasopharyngeal swab, presence of viral mutation(s) within the areas targeted by this assay, and inadequate number of viral copies (<131 copies/mL). A negative result must be combined with clinical observations, patient history, and epidemiological information. The expected result is Negative.  Fact Sheet for Patients:  https://www.moore.com/  Fact Sheet for Healthcare Providers:  https://www.young.biz/  This test is no t yet approved or cleared by the [place] FDA and  has been authorized for detection and/or diagnosis of SARS-CoV-2 by FDA under an Emergency Use Authorization (EUA). This EUA will remain  in effect (meaning this test can be used) for the duration of the COVID-19 declaration under Section 564(b)(1) of the Act, 21 U.S.C. section 360bbb-3(b)(1), unless the authorization is terminated or revoked sooner.     Influenza A by PCR  NEGATIVE NEGATIVE Final   Influenza B by PCR NEGATIVE NEGATIVE Final    Comment: (NOTE) The Xpert Xpress SARS-CoV-2/FLU/RSV assay is intended as an aid in  the diagnosis of influenza from Nasopharyngeal swab specimens and  should not be used as a sole basis for treatment. Nasal washings and  aspirates are unacceptable for Xpert Xpress SARS-CoV-2/FLU/RSV  testing.  Fact Sheet for Patients: https://www.moore.com/  Fact Sheet for Healthcare Providers: https://www.young.biz/  This test is not yet approved or cleared by the [place] FDA and  has been authorized for detection and/or diagnosis of SARS-CoV-2 by  FDA under an Emergency Use Authorization (EUA). This EUA will remain  in effect (meaning this test can be used) for the duration of the  Covid-19 declaration under Section 564(b)(1) of the Act, 21  U.S.C.  section 360bbb-3(b)(1), unless the authorization is  terminated or revoked. Performed at [facility], [address], [place], [place] [address]   Blood culture (routine x 2)     Status: None (Preliminary result)   Collection Time: 04/25/20  4:00 AM   Specimen: BLOOD  Result Value Ref Range Status   Specimen Description BLOOD SITE NOT SPECIFIED  Final   Special Requests   Final    BOTTLES DRAWN AEROBIC AND ANAEROBIC Blood Culture adequate volume   Culture   Final    NO GROWTH 4 DAYS Performed at [facility], [address] [place], [place] [address]    Report Status PENDING  Incomplete  Blood culture (routine x 2)     Status: None (Preliminary result)   Collection Time: 04/25/20  4:22 AM   Specimen: BLOOD  Result Value Ref Range Status   Specimen Description BLOOD SITE NOT SPECIFIED  Final   Special Requests   Final    BOTTLES DRAWN AEROBIC AND ANAEROBIC Blood Culture results may not be optimal due to an excessive volume of blood received in culture bottles   Culture   Final    NO GROWTH 4 DAYS Performed at [facility], [address], [place], [place] [address]    Report Status PENDING  Incomplete  Body fluid culture     Status: None   Collection Time: 04/25/20 10:52 AM   Specimen: Body Fluid  Result Value Ref Range Status   Specimen Description FLUID LEFT KNEE SYNOVIAL  Final   Special Requests NONE  Final   Gram Stain   Final    MODERATE WBC PRESENT, PREDOMINANTLY PMN NO ORGANISMS SEEN    Culture   Final    NO GROWTH 3 DAYS Performed at [facility], [address] [place], [place] [address]    Report Status 04/28/2020 FINAL  Final  Surgical pcr screen     Status: None   Collection Time: 04/26/20  6:22 AM   Specimen: Nasal Mucosa; Nasal Swab  Result Value Ref Range Status   MRSA, PCR NEGATIVE NEGATIVE Final   Staphylococcus aureus NEGATIVE NEGATIVE Final    Comment: (NOTE) The Xpert SA Assay (FDA approved for NASAL specimens in  patients 7 years of age and older), is one component of a comprehensive surveillance program. It is not intended to diagnose infection nor to guide or monitor treatment. Performed at [facility], [address], [place], [place] [address]      Labs: Basic Metabolic Panel: Recent Labs  Lab 04/25/20 0357 04/26/20 0152 04/27/20 0135 04/28/20 0240 04/29/20 0230  NA 136 136 137 137 138  K 4.1 3.4* 4.2 4.3 4.0  CL 101 102 106 105 104  CO2  --  23 21* 23 23  GLUCOSE 213* 117* 148* 149* 94  BUN 46* 31* 27* 20 17  CREATININE 1.20* 0.81 0.95 0.77 0.83  CALCIUM  --  8.8* 8.6* 9.2 8.8*   Liver Function Tests: No results for input(s): AST, ALT, ALKPHOS, BILITOT, PROT, ALBUMIN in the last 168 hours. No results for input(s): LIPASE, AMYLASE in the last 168 hours. No results for input(s): AMMONIA in the last 168 hours. CBC: Recent Labs  Lab 04/25/20 0345 04/25/20 0345 04/25/20 0357 04/26/20 0152 04/27/20 0135 04/28/20 0240 04/29/20 0230  WBC 10.0  --   --  8.5 6.7 6.2 7.0  NEUTROABS 7.5  --   --   --   --   --   --   HGB 15.0   < > 15.6* 12.9 12.6 13.2 13.0  HCT 45.8   < > 46.0 38.7 37.6 39.0 38.1  MCV 94.4  --   --  93.7 92.6 92.0 91.8  PLT 228  --   --  181 166 179 197   < > = values in this interval not displayed.   Cardiac Enzymes: No results for input(s): CKTOTAL, CKMB, CKMBINDEX, TROPONINI in the last 168 hours. BNP: BNP (last 3 results) No results for input(s): BNP in the last 8760 hours.  ProBNP (last 3 results) No results for input(s): PROBNP in the last 8760 hours.  CBG: Recent Labs  Lab 04/28/20 1206 04/28/20 1501 04/28/20 2031 04/29/20 0003 04/29/20 0348  GLUCAP 104* 105* 212* 155* 108*    Principal Problem:   Critical lower limb ischemia (HCC) Active Problems:   Diabetic infection of left foot (HCC)   DM2 (diabetes mellitus, type 2) (HCC)   CKD (chronic kidney disease) stage 3, GFR 30-59 ml/min (HCC)   HTN (hypertension)   Cavitary  lesion of lung   Cellulitis of toe of left foot   Idiopathic chronic gout of left knee without tophus   Gangrene of toe of left foot (HCC)   PAD (peripheral artery disease) (HCC)   Time coordinating discharge: 38 minutes  Signed:        Elvyn Krohn, DO [facility]  04/29/2020, 3:11 PM

## 2020-04-29 NOTE — Progress Notes (Addendum)
  Progress Note    04/29/2020 9:05 AM 1 Day Post-Op  Subjective:  No complaints this morning   Vitals:   04/29/20 0346 04/29/20 0753  BP: (!) 159/42 (!) 152/53  Pulse: 60 (!) 53  Resp: 17 18  Temp: 98.4 F (36.9 C) 98.4 F (36.9 C)  SpO2: 94% 95%   Physical Exam: Lungs:  Non labored Incisions:  Groin cath sites without palpable hematoma Extremities:  L 4th toe tip gangrene Neurologic: A&O  CBC    Component Value Date/Time   WBC 7.0 04/29/2020 0230   RBC 4.15 04/29/2020 0230   HGB 13.0 04/29/2020 0230   HCT 38.1 04/29/2020 0230   PLT 197 04/29/2020 0230   MCV 91.8 04/29/2020 0230   MCH 31.3 04/29/2020 0230   MCHC 34.1 04/29/2020 0230   RDW 12.5 04/29/2020 0230   LYMPHSABS 1.3 04/25/2020 0345   MONOABS 1.1 (H) 04/25/2020 0345   EOSABS 0.1 04/25/2020 0345   BASOSABS 0.0 04/25/2020 0345    BMET    Component Value Date/Time   NA 138 04/29/2020 0230   K 4.0 04/29/2020 0230   CL 104 04/29/2020 0230   CO2 23 04/29/2020 0230   GLUCOSE 94 04/29/2020 0230   BUN 17 04/29/2020 0230   CREATININE 0.83 04/29/2020 0230   CALCIUM 8.8 (L) 04/29/2020 0230   GFRNONAA >60 04/29/2020 0230   GFRAA 52 (L) 02/18/2018 1610    INR    Component Value Date/Time   INR 1.0 04/25/2020 0345     Intake/Output Summary (Last 24 hours) at 04/29/2020 0905 Last data filed at 04/28/2020 1850 Gross per 24 hour  Intake 975.96 ml  Output --  Net 975.96 ml     Assessment/Plan:  69 y.o. female is s/p angiogram 1 Day Post-Op   Bilateral groin cath sites unremarkable No endovascular options based on angiogram demonstrating L CIA and SFA occlusion Patient will likely need fem fem and possible L fem-pop bypass Dr. Darrick Penna will discuss case with Dr. Lorenda Cahill, PA-C [facility] [phone] 04/29/2020 9:05 AM  Pt will need fem fem and left fem pop.  She can go home today.  Dr Myra Gianotti will contact her with an OR date early next week  Fabienne Bruns, MD [facility] [place] Office: [phone]

## 2020-04-29 NOTE — TOC Transition Note (Signed)
Transition of Care [facility]) - CM/SW Discharge Note   Patient Details  Name: Holly Stevenson MRN: [id number] Date of Birth: 04-26-1951  Transition of Care [facility]) CM/SW Contact:  Kallie Locks, RN Phone Number: [phone] 04/29/2020, 1:31 PM   Clinical Narrative:  Received call from nursing regarding home health orders for PT/RN and DME walker. Spoke with patient and daughter. [name] declines the need for PT or RN for wound care. States she is independent and is up walking and using the bathroom independently. Also states that she will be able to do the wound care to her toes. Denies the need for rolling walker. States she already has a rolling walker at home.   Made patient's nurse aware of all of the above. Will send message to MD to make aware as well.           Patient Goals and CMS Choice- declines home health       Discharge Plan and Services- home self care. Declines home health and DME      Raiford Noble, MSN, RN,BSN [facility] Case Manager [phone]

## 2020-04-30 LAB — CULTURE, BLOOD (ROUTINE X 2): Culture: NO GROWTH

## 2020-05-01 ENCOUNTER — Telehealth: Payer: Self-pay | Admitting: Emergency Medicine

## 2020-05-01 ENCOUNTER — Other Ambulatory Visit: Payer: Self-pay

## 2020-05-01 ENCOUNTER — Encounter (HOSPITAL_COMMUNITY): Payer: Self-pay | Admitting: Vascular Surgery

## 2020-05-01 NOTE — Telephone Encounter (Signed)
She'll likely be admitted for the vascular surgery. We will have to assess her at that time and determine whether she can proceed with our procedure.

## 2020-05-01 NOTE — Telephone Encounter (Signed)
Spoke with the pt and notified of response per Dr Byrum  She verbalized understanding  Nothing further needed 

## 2020-05-01 NOTE — Telephone Encounter (Signed)
Called and spoke with patient.  Let her know I was not sure if it would interfere , might depend on how she is feeling, but I would like Dr. Delton Coombes know and once we heard back from him we would let her know his recommendation.   Patient voiced understanding   Dr. Delton Coombes please advise if having bronch 5 days after her femoral - femoral artery bypass. Femoral artery surgery is 05/04/20 and bronch is 05/09/20

## 2020-05-02 ENCOUNTER — Other Ambulatory Visit (HOSPITAL_COMMUNITY)
Admission: RE | Admit: 2020-05-02 | Discharge: 2020-05-02 | Disposition: A | Discharge status: Home / Self Care | Payer: BC Managed Care – PPO | Source: Ambulatory Visit | Attending: Vascular Surgery | Admitting: Vascular Surgery

## 2020-05-02 DIAGNOSIS — Z20822 Contact with and (suspected) exposure to covid-19: Secondary | ICD-10-CM | POA: Insufficient documentation

## 2020-05-02 DIAGNOSIS — Z01812 Encounter for preprocedural laboratory examination: Secondary | ICD-10-CM | POA: Diagnosis present

## 2020-05-02 LAB — SARS CORONAVIRUS 2 (TAT 6-24 HRS): SARS Coronavirus 2: NEGATIVE

## 2020-05-03 ENCOUNTER — Other Ambulatory Visit: Payer: Self-pay

## 2020-05-03 ENCOUNTER — Encounter (HOSPITAL_COMMUNITY)
Admission: RE | Admit: 2020-05-03 | Discharge: 2020-05-03 | Disposition: A | Discharge status: Home / Self Care | Payer: BC Managed Care – PPO | Source: Ambulatory Visit | Attending: Vascular Surgery | Admitting: Vascular Surgery

## 2020-05-03 ENCOUNTER — Encounter (HOSPITAL_COMMUNITY): Payer: Self-pay

## 2020-05-03 DIAGNOSIS — Z01812 Encounter for preprocedural laboratory examination: Secondary | ICD-10-CM | POA: Insufficient documentation

## 2020-05-03 HISTORY — DX: Pneumonia, unspecified organism: J18.9

## 2020-05-03 LAB — URINALYSIS, ROUTINE W REFLEX MICROSCOPIC
Bacteria, UA: NONE SEEN
Bilirubin Urine: NEGATIVE
Glucose, UA: >=500 mg/dL — AB
Hgb urine dipstick: NEGATIVE
Ketones, ur: NEGATIVE mg/dL
Leukocytes,Ua: NEGATIVE
Nitrite: NEGATIVE
Protein, ur: NEGATIVE mg/dL
Specific Gravity, Urine: 1.027 (ref 1.005–1.030)
pH: 5 (ref 5.0–8.0)

## 2020-05-03 LAB — COMPREHENSIVE METABOLIC PANEL
ALT: 45 U/L — ABNORMAL HIGH (ref 0–44)
AST: 34 U/L (ref 15–41)
Albumin: 3.6 g/dL (ref 3.5–5.0)
Alkaline Phosphatase: 70 U/L (ref 38–126)
Anion gap: 13 (ref 5–15)
BUN: 17 mg/dL (ref 8–23)
CO2: 24 mmol/L (ref 22–32)
Calcium: 9.7 mg/dL (ref 8.9–10.3)
Chloride: 100 mmol/L (ref 98–111)
Creatinine, Ser: 0.83 mg/dL (ref 0.44–1.00)
GFR, Estimated: >60 mL/min (ref >60)
Glucose, Bld: 127 mg/dL — ABNORMAL HIGH (ref 70–99)
Potassium: 4.4 mmol/L (ref 3.5–5.1)
Sodium: 137 mmol/L (ref 135–145)
Total Bilirubin: 0.9 mg/dL (ref 0.3–1.2)
Total Protein: 7 g/dL (ref 6.5–8.1)

## 2020-05-03 LAB — APTT: aPTT: 31 seconds (ref 24–36)

## 2020-05-03 LAB — TYPE AND SCREEN
ABO/RH(D): O NEG
Antibody Screen: NEGATIVE

## 2020-05-03 LAB — CBC
HCT: 43.3 % (ref 36.0–46.0)
Hemoglobin: 13.9 g/dL (ref 12.0–15.0)
MCH: 30.9 pg (ref 26.0–34.0)
MCHC: 32.1 g/dL (ref 30.0–36.0)
MCV: 96.2 fL (ref 80.0–100.0)
Platelets: 185 10*3/uL (ref 150–400)
RBC: 4.5 MIL/uL (ref 3.87–5.11)
RDW: 12.6 % (ref 11.5–15.5)
WBC: 9 10*3/uL (ref 4.0–10.5)
nRBC: 0 % (ref 0.0–0.2)

## 2020-05-03 LAB — PROTIME-INR
INR: 1 (ref 0.8–1.2)
Prothrombin Time: 12.9 seconds (ref 11.4–15.2)

## 2020-05-03 LAB — GLUCOSE, CAPILLARY: Glucose-Capillary: 155 mg/dL — ABNORMAL HIGH (ref 70–99)

## 2020-05-03 NOTE — Progress Notes (Signed)
PCP - Dr. Maurice Small Cardiologist - Denies  Chest x-ray - Not indicated EKG - 04/25/20 Stress Test - Years ago during pregnancy ECHO -  04/25/20 Cardiac Cath - Denies  Sleep Study - Denies  DM type II CBG at PAT appt was 155 Fasting Blood Sugar -  Checks Blood Sugar not checking at all right now out of CBG strips   Blood Thinner Instructions: Not currentlly since hospital admission on 04/25/20 Aspirin Instructions:Denies  COVID TEST- 05/02/20 negative  Anesthesia review: No  Patient denies shortness of breath, fever, cough and chest pain at PAT appointment   All instructions explained to the patient, with a verbal understanding of the material. Patient agrees to go over the instructions while at home for a better understanding. Patient also instructed to self quarantine after being tested for COVID-19. The opportunity to ask questions was provided.

## 2020-05-03 NOTE — Progress Notes (Signed)
[id number] [place], [place] - [address] [place] [address] Phone: [phone] Fax: [phone]      Your procedure is scheduled on Thursday November 18th.  Report to [facility] Main Entrance "A" at 6:45 A.M., and check in at the Admitting office.  Call this number if you have problems the morning of surgery:  [phone]  Call [phone] if you have any questions prior to your surgery date Monday-Friday 8am-4pm    Remember:  Do not eat or drink anything after midnight the night before your surgery    Take these medicines the morning of surgery with A SIP OF WATER   atorvastatin (LIPITOR) 20 MG tablet  cephALEXin (KEFLEX) 500 MG capsule     IF NEEDED  oxyCODONE (OXY IR/ROXICODONE) 5 MG immediate release tablet  acetaminophen (TYLENOL) 500 MG tablet   As of today, STOP taking any Aspirin (unless otherwise instructed by your surgeon) Aleve, Naproxen, Ibuprofen, Motrin, Advil, Goody's, BC's, all herbal medications, fish oil, and all vitamins.   WHAT DO I DO ABOUT MY DIABETES MEDICATION?   Marland Kitchen Do not take oral diabetes medicines (Jardiance or Metformin) the morning of surgery.   . Day before surgery hold Jardiance   HOW TO MANAGE YOUR DIABETES BEFORE AND AFTER SURGERY  Why is it important to control my blood sugar before and after surgery? . Improving blood sugar levels before and after surgery helps healing and can limit problems. . A way of improving blood sugar control is eating a healthy diet by: o  Eating less sugar and carbohydrates o  Increasing activity/exercise o  Talking with your doctor about reaching your blood sugar goals . High blood sugars (greater than 180 mg/dL) can raise your risk of infections and slow your recovery, so you will need to focus on controlling your diabetes during the weeks before surgery. . Make sure that the doctor who takes care of your diabetes knows about your planned surgery including the  date and location.  How do I manage my blood sugar before surgery? . Check your blood sugar at least 4 times a day, starting 2 days before surgery, to make sure that the level is not too high or low. . Check your blood sugar the morning of your surgery when you wake up and every 2 hours until you get to the Short Stay unit. o If your blood sugar is less than 70 mg/dL, you will need to treat for low blood sugar: - Do not take insulin. - Treat a low blood sugar (less than 70 mg/dL) with  cup of clear juice (cranberry or apple), 4 glucose tablets, OR glucose gel. - Recheck blood sugar in 15 minutes after treatment (to make sure it is greater than 70 mg/dL). If your blood sugar is not greater than 70 mg/dL on recheck, call [phone] for further instructions. . Report your blood sugar to the short stay nurse when you get to Short Stay.  . If you are admitted to the hospital after surgery: o Your blood sugar will be checked by the staff and you will probably be given insulin after surgery (instead of oral diabetes medicines) to make sure you have good blood sugar levels. o The goal for blood sugar control after surgery is 80-180 mg/dL.                       Do not wear jewelry, make up, or nail polish  Do not wear lotions, powders, perfumes, or deodorant.            Do not shave 48 hours prior to surgery.             Do not bring valuables to the hospital.            [facility] is not responsible for any belongings or valuables.  Do NOT Smoke (Tobacco/Vaping) or drink Alcohol 24 hours prior to your procedure If you use a CPAP at night, you may bring all equipment for your overnight stay.   Contacts, glasses, dentures or bridgework may not be worn into surgery.      For patients admitted to the hospital, discharge time will be determined by your treatment team.   Patients discharged the day of surgery will not be allowed to drive home, and someone needs to stay with them for 24  hours.    Special instructions:   [facility]- Preparing For Surgery  Before surgery, you can play an important role. Because skin is not sterile, your skin needs to be as free of germs as possible. You can reduce the number of germs on your skin by washing with CHG (chlorahexidine gluconate) Soap before surgery.  CHG is an antiseptic cleaner which kills germs and bonds with the skin to continue killing germs even after washing.    Oral Hygiene is also important to reduce your risk of infection.  Remember - BRUSH YOUR TEETH THE MORNING OF SURGERY WITH YOUR REGULAR TOOTHPASTE  Please do not use if you have an allergy to CHG or antibacterial soaps. If your skin becomes reddened/irritated stop using the CHG.  Do not shave (including legs and underarms) for at least 48 hours prior to first CHG shower. It is OK to shave your face.  Please follow these instructions carefully.   1. Shower the NIGHT BEFORE SURGERY and the MORNING OF SURGERY with CHG Soap.   2. If you chose to wash your hair, wash your hair first as usual with your normal shampoo.  3. After you shampoo, rinse your hair and body thoroughly to remove the shampoo.  4. Use CHG as you would any other liquid soap. You can apply CHG directly to the skin and wash gently with a scrungie or a clean washcloth.   5. Apply the CHG Soap to your body ONLY FROM THE NECK DOWN.  Do not use on open wounds or open sores. Avoid contact with your eyes, ears, mouth and genitals (private parts). Wash Face and genitals (private parts)  with your normal soap.   6. Wash thoroughly, paying special attention to the area where your surgery will be performed.  7. Thoroughly rinse your body with warm water from the neck down.  8. DO NOT shower/wash with your normal soap after using and rinsing off the CHG Soap.  9. Pat yourself dry with a CLEAN TOWEL.  10. Wear CLEAN PAJAMAS to bed the night before surgery  11. Place CLEAN SHEETS on your bed the night of  your first shower and DO NOT SLEEP WITH PETS.   Day of Surgery: Wear Clean/Comfortable clothing the morning of surgery Do not apply any deodorants/lotions.   Remember to brush your teeth WITH YOUR REGULAR TOOTHPASTE.   Please read over the following fact sheets that you were given.

## 2020-05-04 ENCOUNTER — Telehealth: Payer: Self-pay | Admitting: Emergency Medicine

## 2020-05-04 ENCOUNTER — Telehealth: Payer: Self-pay

## 2020-05-04 ENCOUNTER — Encounter (HOSPITAL_COMMUNITY): Payer: Self-pay

## 2020-05-04 NOTE — Telephone Encounter (Signed)
Spoke with Trena Platt, RN, with endo and was advised the bronch has been rescheduled for 12/7 at 0730 at [facility].   Called and spoke to pt. Informed her of this change, she is agreeable. Pt will need to have COVID test rescheduled. Pt aware she will be contacted to get this rescheduled.   Will forward to procedure pool to reschedule COVID test.

## 2020-05-04 NOTE — Telephone Encounter (Signed)
Pt called with questions regarding her vascular surgery date being pushed back a few days but her bronchoscopy has not yet been moved. She has called and left a message with the office scheduling bronchoscopy to let them know her vascular surgery date has changed, so that they can r/s her bronchoscopy.

## 2020-05-04 NOTE — Telephone Encounter (Signed)
Called and spoke with pt letting her know that we were going to change the date of her bronch from 11/23 to 12/7. Pt verbalized understanding. Stated to pt that we would call her once we have changed the date/time and she verbalized understanding.

## 2020-05-04 NOTE — Telephone Encounter (Signed)
I could do it on 05/23/20.  If that works for her then please let Endoscopy know to cancel for 11/23 and request 12/7

## 2020-05-04 NOTE — Telephone Encounter (Signed)
Called spoke with patient, she is now scheduled for 05/20/20 for her covid test verified location with her ,  Patient verbalized understanding  Nothing further needed at this time.

## 2020-05-04 NOTE — Telephone Encounter (Signed)
Called and spoke to pt. Pt states she is scheduled for a femoral artery bypass graft on 11/24 and also scheduled for a bronchoscopy on 11/23. Pt questioned if she should keep her bronchoscopy scheduled. Pt states she already spoke to the vascular surgeon and he advised pt to postpone the bronchoscopy. Pt states she would like to postpone the bronchoscopy.   Dr. Delton Coombes, please advised when pt can reschedule her bronch. Thanks.

## 2020-05-06 ENCOUNTER — Other Ambulatory Visit (HOSPITAL_COMMUNITY): Payer: BC Managed Care – PPO

## 2020-05-08 ENCOUNTER — Other Ambulatory Visit (HOSPITAL_COMMUNITY): Payer: BC Managed Care – PPO

## 2020-05-08 ENCOUNTER — Other Ambulatory Visit (HOSPITAL_COMMUNITY)
Admission: RE | Admit: 2020-05-08 | Discharge: 2020-05-08 | Disposition: A | Discharge status: Home / Self Care | Payer: BC Managed Care – PPO | Source: Ambulatory Visit | Attending: Vascular Surgery | Admitting: Vascular Surgery

## 2020-05-08 DIAGNOSIS — Z20822 Contact with and (suspected) exposure to covid-19: Secondary | ICD-10-CM | POA: Insufficient documentation

## 2020-05-08 DIAGNOSIS — Z01812 Encounter for preprocedural laboratory examination: Secondary | ICD-10-CM | POA: Insufficient documentation

## 2020-05-08 LAB — SARS CORONAVIRUS 2 (TAT 6-24 HRS): SARS Coronavirus 2: NEGATIVE

## 2020-05-09 NOTE — Progress Notes (Addendum)
I spoke to Ms Holly Stevenson and confirmed arrival time of 0800 om 05/10/20.  [name] reports that nothing has changed since she was here. [name] tested negative for Covid 05/08/20 and has been in quarantine since that time.

## 2020-05-10 ENCOUNTER — Inpatient Hospital Stay (HOSPITAL_COMMUNITY): Payer: BC Managed Care – PPO | Admitting: Certified Registered Nurse Anesthetist

## 2020-05-10 ENCOUNTER — Encounter (HOSPITAL_COMMUNITY): Payer: Self-pay

## 2020-05-10 ENCOUNTER — Other Ambulatory Visit: Payer: Self-pay

## 2020-05-10 ENCOUNTER — Inpatient Hospital Stay (HOSPITAL_COMMUNITY): Payer: BC Managed Care – PPO

## 2020-05-10 ENCOUNTER — Encounter (HOSPITAL_COMMUNITY)
Admission: RE | Disposition: A | Discharge status: Home / Self Care | Payer: Self-pay | Source: Home / Self Care | Attending: Vascular Surgery

## 2020-05-10 ENCOUNTER — Inpatient Hospital Stay (HOSPITAL_COMMUNITY)
Admission: RE | Admit: 2020-05-10 | Discharge: 2020-05-13 | DRG: 271 | Disposition: A | Discharge status: Home / Self Care | Payer: BC Managed Care – PPO | Attending: Vascular Surgery | Admitting: Vascular Surgery

## 2020-05-10 DIAGNOSIS — L97529 Non-pressure chronic ulcer of other part of left foot with unspecified severity: Secondary | ICD-10-CM | POA: Diagnosis present

## 2020-05-10 DIAGNOSIS — I70262 Atherosclerosis of native arteries of extremities with gangrene, left leg: Secondary | ICD-10-CM | POA: Diagnosis present

## 2020-05-10 DIAGNOSIS — E1152 Type 2 diabetes mellitus with diabetic peripheral angiopathy with gangrene: Principal | ICD-10-CM | POA: Diagnosis present

## 2020-05-10 DIAGNOSIS — I998 Other disorder of circulatory system: Secondary | ICD-10-CM | POA: Diagnosis not present

## 2020-05-10 DIAGNOSIS — Z20822 Contact with and (suspected) exposure to covid-19: Secondary | ICD-10-CM | POA: Diagnosis present

## 2020-05-10 DIAGNOSIS — I739 Peripheral vascular disease, unspecified: Secondary | ICD-10-CM | POA: Diagnosis present

## 2020-05-10 DIAGNOSIS — I10 Essential (primary) hypertension: Secondary | ICD-10-CM | POA: Diagnosis present

## 2020-05-10 DIAGNOSIS — S91105A Unspecified open wound of left lesser toe(s) without damage to nail, initial encounter: Secondary | ICD-10-CM | POA: Diagnosis present

## 2020-05-10 DIAGNOSIS — M1990 Unspecified osteoarthritis, unspecified site: Secondary | ICD-10-CM | POA: Diagnosis present

## 2020-05-10 DIAGNOSIS — Z7984 Long term (current) use of oral hypoglycemic drugs: Secondary | ICD-10-CM | POA: Diagnosis not present

## 2020-05-10 DIAGNOSIS — F17200 Nicotine dependence, unspecified, uncomplicated: Secondary | ICD-10-CM | POA: Diagnosis present

## 2020-05-10 DIAGNOSIS — E7800 Pure hypercholesterolemia, unspecified: Secondary | ICD-10-CM | POA: Diagnosis present

## 2020-05-10 DIAGNOSIS — Z419 Encounter for procedure for purposes other than remedying health state, unspecified: Secondary | ICD-10-CM

## 2020-05-10 DIAGNOSIS — M109 Gout, unspecified: Secondary | ICD-10-CM | POA: Diagnosis present

## 2020-05-10 DIAGNOSIS — M25562 Pain in left knee: Secondary | ICD-10-CM | POA: Diagnosis present

## 2020-05-10 HISTORY — PX: FEMORAL-FEMORAL BYPASS GRAFT: SHX936

## 2020-05-10 HISTORY — PX: LOWER EXTREMITY ANGIOGRAM: SHX5508

## 2020-05-10 HISTORY — PX: ENDARTERECTOMY FEMORAL: SHX5804

## 2020-05-10 HISTORY — PX: FEMORAL-POPLITEAL BYPASS GRAFT: SHX937

## 2020-05-10 HISTORY — PX: PATCH ANGIOPLASTY: SHX6230

## 2020-05-10 LAB — GLUCOSE, CAPILLARY
Glucose-Capillary: 127 mg/dL — ABNORMAL HIGH (ref 70–99)
Glucose-Capillary: 131 mg/dL — ABNORMAL HIGH (ref 70–99)
Glucose-Capillary: 131 mg/dL — ABNORMAL HIGH (ref 70–99)
Glucose-Capillary: 184 mg/dL — ABNORMAL HIGH (ref 70–99)
Glucose-Capillary: 243 mg/dL — ABNORMAL HIGH (ref 70–99)
Glucose-Capillary: 262 mg/dL — ABNORMAL HIGH (ref 70–99)

## 2020-05-10 LAB — POCT I-STAT, CHEM 8
BUN: 16 mg/dL (ref 8–23)
Calcium, Ion: 1.16 mmol/L (ref 1.15–1.40)
Chloride: 103 mmol/L (ref 98–111)
Creatinine, Ser: 0.8 mg/dL (ref 0.44–1.00)
Glucose, Bld: 268 mg/dL — ABNORMAL HIGH (ref 70–99)
HCT: 33 % — ABNORMAL LOW (ref 36.0–46.0)
Hemoglobin: 11.2 g/dL — ABNORMAL LOW (ref 12.0–15.0)
Potassium: 4.3 mmol/L (ref 3.5–5.1)
Sodium: 138 mmol/L (ref 135–145)
TCO2: 25 mmol/L (ref 22–32)

## 2020-05-10 LAB — ABO/RH: ABO/RH(D): O NEG

## 2020-05-10 SURGERY — CREATION, BYPASS, ARTERIAL, FEMORAL TO FEMORAL, USING GRAFT
Anesthesia: General | Site: Groin | Laterality: Right

## 2020-05-10 MED ORDER — FENTANYL CITRATE (PF) 250 MCG/5ML IJ SOLN
INTRAMUSCULAR | Status: AC
  Filled 2020-05-10: qty 5

## 2020-05-10 MED ORDER — EMPAGLIFLOZIN 25 MG PO TABS
25.0000 mg | ORAL_TABLET | Freq: Every day | ORAL | Status: DC
  Filled 2020-05-10: qty 1

## 2020-05-10 MED ORDER — LABETALOL HCL 5 MG/ML IV SOLN: 10.0000 mg | INTRAVENOUS | Status: DC | PRN

## 2020-05-10 MED ORDER — MORPHINE SULFATE (PF) 2 MG/ML IV SOLN: 2.0000 mg | INTRAVENOUS | Status: DC | PRN

## 2020-05-10 MED ORDER — METFORMIN HCL 500 MG PO TABS: 1000.0000 mg | ORAL_TABLET | Freq: Two times a day (BID) | ORAL | Status: DC

## 2020-05-10 MED ORDER — PHENYLEPHRINE 40 MCG/ML (10ML) SYRINGE FOR IV PUSH (FOR BLOOD PRESSURE SUPPORT)
PREFILLED_SYRINGE | INTRAVENOUS | Status: AC
  Filled 2020-05-10: qty 10

## 2020-05-10 MED ORDER — CELECOXIB 200 MG PO CAPS
200.0000 mg | ORAL_CAPSULE | Freq: Once | ORAL | Status: AC
  Administered 2020-05-10: 200 mg via ORAL
  Filled 2020-05-10: qty 1

## 2020-05-10 MED ORDER — ALBUMIN HUMAN 5 % IV SOLN: INTRAVENOUS | Status: DC | PRN

## 2020-05-10 MED ORDER — BENAZEPRIL HCL 5 MG PO TABS
10.0000 mg | ORAL_TABLET | Freq: Every day | ORAL | Status: DC
  Administered 2020-05-10 – 2020-05-13 (×4): 10 mg via ORAL
  Filled 2020-05-10 (×4): qty 2

## 2020-05-10 MED ORDER — ALUM & MAG HYDROXIDE-SIMETH 200-200-20 MG/5ML PO SUSP: 15.0000 mL | ORAL | Status: DC | PRN

## 2020-05-10 MED ORDER — CEFAZOLIN SODIUM-DEXTROSE 2-4 GM/100ML-% IV SOLN
2.0000 g | INTRAVENOUS | Status: AC
  Administered 2020-05-10 (×2): 2 g via INTRAVENOUS
  Filled 2020-05-10: qty 100

## 2020-05-10 MED ORDER — OXYCODONE HCL 5 MG PO TABS
5.0000 mg | ORAL_TABLET | ORAL | Status: DC | PRN
  Administered 2020-05-11: 5 mg via ORAL
  Administered 2020-05-11 – 2020-05-12 (×2): 10 mg via ORAL
  Filled 2020-05-10 (×2): qty 2
  Filled 2020-05-10: qty 1

## 2020-05-10 MED ORDER — ACETAMINOPHEN 325 MG PO TABS
650.0000 mg | ORAL_TABLET | Freq: Four times a day (QID) | ORAL | Status: DC
  Administered 2020-05-10 – 2020-05-13 (×8): 650 mg via ORAL
  Filled 2020-05-10 (×8): qty 2

## 2020-05-10 MED ORDER — ACETAMINOPHEN 325 MG PO TABS: 325.0000 mg | ORAL_TABLET | ORAL | Status: DC | PRN

## 2020-05-10 MED ORDER — PANTOPRAZOLE SODIUM 40 MG PO TBEC
40.0000 mg | DELAYED_RELEASE_TABLET | Freq: Every day | ORAL | Status: DC
  Administered 2020-05-10 – 2020-05-13 (×4): 40 mg via ORAL
  Filled 2020-05-10 (×4): qty 1

## 2020-05-10 MED ORDER — MIDAZOLAM HCL 2 MG/2ML IJ SOLN
INTRAMUSCULAR | Status: DC | PRN
  Administered 2020-05-10: 2 mg via INTRAVENOUS

## 2020-05-10 MED ORDER — THROMBIN (RECOMBINANT) 20000 UNITS EX SOLR
CUTANEOUS | Status: AC
  Filled 2020-05-10: qty 20000

## 2020-05-10 MED ORDER — INSULIN ASPART 100 UNIT/ML ~~LOC~~ SOLN
SUBCUTANEOUS | Status: AC
  Filled 2020-05-10: qty 1

## 2020-05-10 MED ORDER — ACETAMINOPHEN 650 MG RE SUPP: 325.0000 mg | RECTAL | Status: DC | PRN

## 2020-05-10 MED ORDER — SODIUM CHLORIDE 0.9 % IV SOLN
INTRAVENOUS | Status: AC
  Filled 2020-05-10: qty 1.2

## 2020-05-10 MED ORDER — CHLORHEXIDINE GLUCONATE 0.12 % MT SOLN
OROMUCOSAL | Status: AC
  Administered 2020-05-10: 15 mL
  Filled 2020-05-10: qty 15

## 2020-05-10 MED ORDER — MAGNESIUM SULFATE 2 GM/50ML IV SOLN: 2.0000 g | Freq: Every day | INTRAVENOUS | Status: DC | PRN

## 2020-05-10 MED ORDER — CHLORHEXIDINE GLUCONATE CLOTH 2 % EX PADS
6.0000 | MEDICATED_PAD | Freq: Every day | CUTANEOUS | Status: DC
  Administered 2020-05-10: 6 via TOPICAL

## 2020-05-10 MED ORDER — GUAIFENESIN-DM 100-10 MG/5ML PO SYRP: 15.0000 mL | ORAL_SOLUTION | ORAL | Status: DC | PRN

## 2020-05-10 MED ORDER — LACTATED RINGERS IV SOLN: INTRAVENOUS | Status: DC | PRN

## 2020-05-10 MED ORDER — PROPOFOL 10 MG/ML IV BOLUS
INTRAVENOUS | Status: AC
  Filled 2020-05-10: qty 20

## 2020-05-10 MED ORDER — DOCUSATE SODIUM 100 MG PO CAPS
100.0000 mg | ORAL_CAPSULE | Freq: Every day | ORAL | Status: DC
  Administered 2020-05-11 – 2020-05-13 (×3): 100 mg via ORAL
  Filled 2020-05-10 (×3): qty 1

## 2020-05-10 MED ORDER — HEPARIN SODIUM (PORCINE) 1000 UNIT/ML IJ SOLN
INTRAMUSCULAR | Status: AC
  Filled 2020-05-10: qty 1

## 2020-05-10 MED ORDER — ONDANSETRON HCL 4 MG/2ML IJ SOLN
INTRAMUSCULAR | Status: DC | PRN
  Administered 2020-05-10 (×2): 4 mg via INTRAVENOUS

## 2020-05-10 MED ORDER — ASPIRIN EC 81 MG PO TBEC
81.0000 mg | DELAYED_RELEASE_TABLET | Freq: Every day | ORAL | Status: DC
  Administered 2020-05-11 – 2020-05-13 (×3): 81 mg via ORAL
  Filled 2020-05-10 (×3): qty 1

## 2020-05-10 MED ORDER — FENTANYL CITRATE (PF) 100 MCG/2ML IJ SOLN
INTRAMUSCULAR | Status: DC | PRN
  Administered 2020-05-10 (×10): 50 ug via INTRAVENOUS

## 2020-05-10 MED ORDER — 0.9 % SODIUM CHLORIDE (POUR BTL) OPTIME
TOPICAL | Status: DC | PRN
  Administered 2020-05-10 (×2): 1000 mL

## 2020-05-10 MED ORDER — MIDAZOLAM HCL 2 MG/2ML IJ SOLN
INTRAMUSCULAR | Status: AC
  Filled 2020-05-10: qty 2

## 2020-05-10 MED ORDER — ONDANSETRON HCL 4 MG/2ML IJ SOLN
INTRAMUSCULAR | Status: AC
  Filled 2020-05-10: qty 2

## 2020-05-10 MED ORDER — CEFAZOLIN SODIUM 1 G IJ SOLR
INTRAMUSCULAR | Status: AC
  Filled 2020-05-10: qty 20

## 2020-05-10 MED ORDER — CEFAZOLIN SODIUM-DEXTROSE 1-4 GM/50ML-% IV SOLN
1.0000 g | Freq: Three times a day (TID) | INTRAVENOUS | Status: AC
  Administered 2020-05-10 – 2020-05-11 (×2): 1 g via INTRAVENOUS
  Filled 2020-05-10 (×2): qty 50

## 2020-05-10 MED ORDER — SODIUM CHLORIDE (PF) 0.9 % IJ SOLN: INTRAVENOUS | Status: DC | PRN

## 2020-05-10 MED ORDER — SODIUM CHLORIDE 0.9 % IV SOLN: INTRAVENOUS | Status: DC

## 2020-05-10 MED ORDER — LACTATED RINGERS IV SOLN: INTRAVENOUS | Status: DC

## 2020-05-10 MED ORDER — HYDROMORPHONE HCL 1 MG/ML IJ SOLN: 0.2500 mg | INTRAMUSCULAR | Status: DC | PRN

## 2020-05-10 MED ORDER — SODIUM CHLORIDE 0.9 % IV SOLN: 500.0000 mL | Freq: Once | INTRAVENOUS | Status: DC | PRN

## 2020-05-10 MED ORDER — HYDRALAZINE HCL 20 MG/ML IJ SOLN: 5.0000 mg | INTRAMUSCULAR | Status: DC | PRN

## 2020-05-10 MED ORDER — CHLORHEXIDINE GLUCONATE CLOTH 2 % EX PADS: 6.0000 | MEDICATED_PAD | Freq: Once | CUTANEOUS | Status: DC

## 2020-05-10 MED ORDER — METOPROLOL TARTRATE 5 MG/5ML IV SOLN: 2.0000 mg | INTRAVENOUS | Status: DC | PRN

## 2020-05-10 MED ORDER — HEPARIN SODIUM (PORCINE) 1000 UNIT/ML IJ SOLN
INTRAMUSCULAR | Status: DC | PRN
  Administered 2020-05-10 (×3): 1000 [IU] via INTRAVENOUS
  Administered 2020-05-10: 8000 [IU] via INTRAVENOUS

## 2020-05-10 MED ORDER — HEMOSTATIC AGENTS (NO CHARGE) OPTIME
TOPICAL | Status: DC | PRN
  Administered 2020-05-10 (×2): 1 via TOPICAL

## 2020-05-10 MED ORDER — BISACODYL 10 MG RE SUPP: 10.0000 mg | Freq: Every day | RECTAL | Status: DC | PRN

## 2020-05-10 MED ORDER — ROCURONIUM BROMIDE 10 MG/ML (PF) SYRINGE
PREFILLED_SYRINGE | INTRAVENOUS | Status: DC | PRN
  Administered 2020-05-10: 30 mg via INTRAVENOUS
  Administered 2020-05-10 (×3): 20 mg via INTRAVENOUS
  Administered 2020-05-10: 60 mg via INTRAVENOUS
  Administered 2020-05-10: 30 mg via INTRAVENOUS
  Administered 2020-05-10 (×2): 20 mg via INTRAVENOUS

## 2020-05-10 MED ORDER — INSULIN ASPART 100 UNIT/ML ~~LOC~~ SOLN
SUBCUTANEOUS | Status: DC | PRN
  Administered 2020-05-10: 4 [IU] via SUBCUTANEOUS

## 2020-05-10 MED ORDER — LIDOCAINE HCL (PF) 2 % IJ SOLN
INTRAMUSCULAR | Status: AC
  Filled 2020-05-10: qty 5

## 2020-05-10 MED ORDER — SODIUM CHLORIDE 0.9 % IV SOLN: INTRAVENOUS | Status: DC | PRN

## 2020-05-10 MED ORDER — POTASSIUM CHLORIDE CRYS ER 20 MEQ PO TBCR: 20.0000 meq | EXTENDED_RELEASE_TABLET | Freq: Every day | ORAL | Status: DC | PRN

## 2020-05-10 MED ORDER — ATORVASTATIN CALCIUM 10 MG PO TABS
20.0000 mg | ORAL_TABLET | Freq: Every day | ORAL | Status: DC
  Administered 2020-05-11 – 2020-05-12 (×2): 20 mg via ORAL
  Filled 2020-05-10: qty 2

## 2020-05-10 MED ORDER — DEXAMETHASONE SODIUM PHOSPHATE 10 MG/ML IJ SOLN
INTRAMUSCULAR | Status: AC
  Filled 2020-05-10: qty 1

## 2020-05-10 MED ORDER — ONDANSETRON HCL 4 MG/2ML IJ SOLN
4.0000 mg | Freq: Four times a day (QID) | INTRAMUSCULAR | Status: DC | PRN
  Administered 2020-05-12: 4 mg via INTRAVENOUS
  Filled 2020-05-10 (×2): qty 2

## 2020-05-10 MED ORDER — CEFAZOLIN SODIUM-DEXTROSE 2-4 GM/100ML-% IV SOLN: 2.0000 g | Freq: Three times a day (TID) | INTRAVENOUS | Status: DC

## 2020-05-10 MED ORDER — DEXAMETHASONE SODIUM PHOSPHATE 10 MG/ML IJ SOLN
INTRAMUSCULAR | Status: DC | PRN
  Administered 2020-05-10: 10 mg via INTRAVENOUS

## 2020-05-10 MED ORDER — EPHEDRINE SULFATE-NACL 50-0.9 MG/10ML-% IV SOSY
PREFILLED_SYRINGE | INTRAVENOUS | Status: DC | PRN
  Administered 2020-05-10 (×3): 10 mg via INTRAVENOUS

## 2020-05-10 MED ORDER — OXYCODONE-ACETAMINOPHEN 5-325 MG PO TABS: 1.0000 | ORAL_TABLET | ORAL | Status: DC | PRN

## 2020-05-10 MED ORDER — SUGAMMADEX SODIUM 200 MG/2ML IV SOLN
INTRAVENOUS | Status: DC | PRN
  Administered 2020-05-10: 200 mg via INTRAVENOUS

## 2020-05-10 MED ORDER — PHENOL 1.4 % MT LIQD: 1.0000 | OROMUCOSAL | Status: DC | PRN

## 2020-05-10 MED ORDER — POLYETHYLENE GLYCOL 3350 17 G PO PACK: 17.0000 g | PACK | Freq: Every day | ORAL | Status: DC | PRN

## 2020-05-10 MED ORDER — ACETAMINOPHEN 500 MG PO TABS
1000.0000 mg | ORAL_TABLET | Freq: Once | ORAL | Status: AC
  Administered 2020-05-10: 1000 mg via ORAL
  Filled 2020-05-10: qty 2

## 2020-05-10 MED ORDER — PROPOFOL 10 MG/ML IV BOLUS
INTRAVENOUS | Status: DC | PRN
  Administered 2020-05-10: 100 mg via INTRAVENOUS

## 2020-05-10 MED ORDER — INSULIN ASPART 100 UNIT/ML ~~LOC~~ SOLN
6.0000 [IU] | Freq: Once | SUBCUTANEOUS | Status: AC
  Administered 2020-05-10: 6 [IU] via SUBCUTANEOUS

## 2020-05-10 MED ORDER — LIDOCAINE 2% (20 MG/ML) 5 ML SYRINGE
INTRAMUSCULAR | Status: DC | PRN
  Administered 2020-05-10: 60 mg via INTRAVENOUS

## 2020-05-10 MED ORDER — PHENYLEPHRINE 40 MCG/ML (10ML) SYRINGE FOR IV PUSH (FOR BLOOD PRESSURE SUPPORT)
PREFILLED_SYRINGE | INTRAVENOUS | Status: DC | PRN
  Administered 2020-05-10: 80 ug via INTRAVENOUS

## 2020-05-10 MED ORDER — THROMBIN 20000 UNITS EX SOLR
CUTANEOUS | Status: DC | PRN
  Administered 2020-05-10: 20 mL via TOPICAL

## 2020-05-10 MED ORDER — PROTAMINE SULFATE 10 MG/ML IV SOLN
INTRAVENOUS | Status: DC | PRN
  Administered 2020-05-10 (×5): 10 mg via INTRAVENOUS

## 2020-05-10 MED ORDER — ROCURONIUM BROMIDE 10 MG/ML (PF) SYRINGE
PREFILLED_SYRINGE | INTRAVENOUS | Status: AC
  Filled 2020-05-10: qty 10

## 2020-05-10 SURGICAL SUPPLY — 82 items
BANDAGE ESMARK 6X9 LF (GAUZE/BANDAGES/DRESSINGS) IMPLANT
BENZOIN TINCTURE PRP APPL 2/3 (GAUZE/BANDAGES/DRESSINGS) ×15 IMPLANT
BNDG ESMARK 6X9 LF (GAUZE/BANDAGES/DRESSINGS)
CANISTER SUCT 3000ML PPV (MISCELLANEOUS) ×5 IMPLANT
CANNULA VESSEL 3MM 2 BLNT TIP (CANNULA) ×10 IMPLANT
CHLORAPREP W/TINT 26 (MISCELLANEOUS) ×10 IMPLANT
CLIP VESOCCLUDE MED 24/CT (CLIP) ×5 IMPLANT
CLIP VESOCCLUDE SM WIDE 24/CT (CLIP) ×5 IMPLANT
CLOSURE WOUND 1/2 X4 (GAUZE/BANDAGES/DRESSINGS) ×3
COVER PROBE W GEL 5X96 (DRAPES) ×5 IMPLANT
COVER WAND RF STERILE (DRAPES) IMPLANT
CUFF TOURN SGL QUICK 24 (TOURNIQUET CUFF)
CUFF TOURN SGL QUICK 34 (TOURNIQUET CUFF)
CUFF TOURN SGL QUICK 42 (TOURNIQUET CUFF) IMPLANT
CUFF TRNQT CYL 24X4X16.5-23 (TOURNIQUET CUFF) IMPLANT
CUFF TRNQT CYL 34X4.125X (TOURNIQUET CUFF) IMPLANT
DRAIN CHANNEL 15F RND FF W/TCR (WOUND CARE) IMPLANT
DRAIN PENROSE 1/4X12 LTX STRL (WOUND CARE) IMPLANT
DRAPE C-ARM 42X72 X-RAY (DRAPES) IMPLANT
DRAPE HALF SHEET 40X57 (DRAPES) IMPLANT
DRAPE X-RAY CASS 24X20 (DRAPES) IMPLANT
DRSG AQUACEL AG ADV 3.5X 6 (GAUZE/BANDAGES/DRESSINGS) ×15 IMPLANT
ELECT REM PT RETURN 9FT ADLT (ELECTROSURGICAL) ×5
ELECTRODE REM PT RTRN 9FT ADLT (ELECTROSURGICAL) ×3 IMPLANT
EVACUATOR SILICONE 100CC (DRAIN) IMPLANT
GAUZE SPONGE 4X4 12PLY STRL (GAUZE/BANDAGES/DRESSINGS) ×5 IMPLANT
GLOVE BIO SURGEON STRL SZ 6.5 (GLOVE) ×12 IMPLANT
GLOVE BIO SURGEON STRL SZ7.5 (GLOVE) ×5 IMPLANT
GLOVE BIO SURGEONS STRL SZ 6.5 (GLOVE) ×3
GLOVE BIOGEL PI IND STRL 6.5 (GLOVE) ×21 IMPLANT
GLOVE BIOGEL PI IND STRL 8 (GLOVE) ×3 IMPLANT
GLOVE BIOGEL PI INDICATOR 6.5 (GLOVE) ×14
GLOVE BIOGEL PI INDICATOR 8 (GLOVE) ×2
GLOVE ECLIPSE 6.5 STRL STRAW (GLOVE) ×10 IMPLANT
GLOVE SS BIOGEL STRL SZ 6.5 (GLOVE) ×9 IMPLANT
GLOVE SUPERSENSE BIOGEL SZ 6.5 (GLOVE) ×6
GLOVE SURG SS PI 8.0 STRL IVOR (GLOVE) ×10 IMPLANT
GLOVE SURG UNDER POLY LF SZ7 (GLOVE) ×5 IMPLANT
GOWN STRL REUS W/ TWL LRG LVL3 (GOWN DISPOSABLE) ×15 IMPLANT
GOWN STRL REUS W/ TWL XL LVL3 (GOWN DISPOSABLE) ×6 IMPLANT
GOWN STRL REUS W/TWL LRG LVL3 (GOWN DISPOSABLE) ×15
GOWN STRL REUS W/TWL XL LVL3 (GOWN DISPOSABLE) ×6
GRAFT PROPATEN W/RING 6X80X60 (Vascular Products) ×10 IMPLANT
HEMOSTAT SURGICEL 2X14 (HEMOSTASIS) ×5 IMPLANT
INSERT FOGARTY SM (MISCELLANEOUS) ×10 IMPLANT
KIT BASIN OR (CUSTOM PROCEDURE TRAY) ×5 IMPLANT
KIT TURNOVER KIT B (KITS) ×5 IMPLANT
LOOP VESSEL MINI RED (MISCELLANEOUS) ×5 IMPLANT
MARKER GRAFT CORONARY BYPASS (MISCELLANEOUS) IMPLANT
NS IRRIG 1000ML POUR BTL (IV SOLUTION) ×10 IMPLANT
PACK PERIPHERAL VASCULAR (CUSTOM PROCEDURE TRAY) ×5 IMPLANT
PAD ARMBOARD 7.5X6 YLW CONV (MISCELLANEOUS) ×10 IMPLANT
PATCH VASC XENOSURE 1CMX6CM (Vascular Products) ×2 IMPLANT
PATCH VASC XENOSURE 1X6 (Vascular Products) ×3 IMPLANT
POUCH SELF SEAL 3.5X9 (STERILIZATION PRODUCTS) ×5 IMPLANT
SET COLLECT BLD 21X3/4 12 (NEEDLE) IMPLANT
SET MICROPUNCTURE 5F STIFF (MISCELLANEOUS) ×5 IMPLANT
SPONGE LAP 18X18 RF (DISPOSABLE) ×15 IMPLANT
SPONGE SURGIFOAM ABS GEL 100 (HEMOSTASIS) ×10 IMPLANT
SPONGE SURGIFOAM ABS GEL 100C (HEMOSTASIS) ×5 IMPLANT
STOPCOCK 4 WAY LG BORE MALE ST (IV SETS) ×5 IMPLANT
STRIP CLOSURE SKIN 1/2X4 (GAUZE/BANDAGES/DRESSINGS) ×12 IMPLANT
SUT ETHILON 3 0 PS 1 (SUTURE) IMPLANT
SUT GORETEX 6.0 TT9 (SUTURE) ×15 IMPLANT
SUT MNCRL AB 4-0 PS2 18 (SUTURE) ×25 IMPLANT
SUT PROLENE 5 0 C 1 24 (SUTURE) ×50 IMPLANT
SUT PROLENE 6 0 BV (SUTURE) ×20 IMPLANT
SUT SILK 2 0 PERMA HAND 18 BK (SUTURE) ×5 IMPLANT
SUT SILK 2 0 SH (SUTURE) ×5 IMPLANT
SUT SILK 3 0 (SUTURE)
SUT SILK 3-0 18XBRD TIE 12 (SUTURE) IMPLANT
SUT VIC AB 2-0 CT1 27 (SUTURE) ×24
SUT VIC AB 2-0 CT1 TAPERPNT 27 (SUTURE) ×18 IMPLANT
SUT VIC AB 3-0 SH 27 (SUTURE) ×4
SUT VIC AB 3-0 SH 27X BRD (SUTURE) ×6 IMPLANT
SYR 30ML LL (SYRINGE) ×5 IMPLANT
TOWEL GREEN STERILE (TOWEL DISPOSABLE) ×10 IMPLANT
TRAY FOLEY MTR SLVR 14FR STAT (SET/KITS/TRAYS/PACK) ×5 IMPLANT
TRAY FOLEY MTR SLVR 16FR STAT (SET/KITS/TRAYS/PACK) IMPLANT
TUBING EXTENTION W/L.L. (IV SETS) ×5 IMPLANT
UNDERPAD 30X36 HEAVY ABSORB (UNDERPADS AND DIAPERS) ×5 IMPLANT
WATER STERILE IRR 1000ML POUR (IV SOLUTION) ×5 IMPLANT

## 2020-05-10 NOTE — Transfer of Care (Signed)
Immediate Anesthesia Transfer of Care Note  Patient: Holly Stevenson  Procedure(s) Performed: RIGHT TO LEFT FEMORAL-FEMORAL ARTERY BYPASS GRAFT (Bilateral ) LEFT FEMORAL-POPLITEAL ARTERY BYPASS GRAFT (Left ) PATCH ANGIOPLASTY USING XENOSURE PATCH (Right Groin) ENDARTERECTOMY RIGHT FEMORAL (Right Groin) LEFT LOWER EXTREMITY ANGIOGRAM (Left )  Patient Location: PACU  Anesthesia Type:General  Level of Consciousness: drowsy and patient cooperative  Airway & Oxygen Therapy: Patient Spontanous Breathing and Patient connected to nasal cannula oxygen  Post-op Assessment: Report given to RN and Post -op Vital signs reviewed and stable  Post vital signs: Reviewed and stable  Last Vitals:  Vitals Value Taken Time  BP 142/52 05/10/20 1653  Temp 36.6 C 05/10/20 1653  Pulse 71 05/10/20 1700  Resp 14 05/10/20 1700  SpO2 97 % 05/10/20 1700  Vitals shown include unvalidated device data.  Last Pain:  Vitals:   05/10/20 1653  TempSrc:   PainSc: Asleep         Complications: No complications documented.

## 2020-05-10 NOTE — Interval H&P Note (Signed)
History and Physical Interval Note:  05/10/2020 8:52 AM  Holly Stevenson  has presented today for surgery, with the diagnosis of CRITICAL LOWER LIMB ISCHEMIA.  The various methods of treatment have been discussed with the patient and family. After consideration of risks, benefits and other options for treatment, the patient has consented to  Procedure(s): RIGHT TO LEFT FEMORAL-FEMORAL ARTERY BYPASS GRAFT (Bilateral) LEFT FEMORAL-POPLITEAL ARTERY BYPASS GRAFT (Left) as a surgical intervention.  The patient's history has been reviewed, patient examined, no change in status, stable for surgery.  I have reviewed the patient's chart and labs.  Questions were answered to the patient's satisfaction.     Leonie Douglas

## 2020-05-10 NOTE — Progress Notes (Signed)
Pt arrived from unit from PACU.Will continue to monitor. Pt. Oriented to unit . CHG bath given  Everlean Cherry, RN

## 2020-05-10 NOTE — Op Note (Signed)
DATE OF SERVICE: 05/10/2020  PATIENT:  Holly Stevenson  69 y.o. female  PRE-OPERATIVE DIAGNOSIS:  CRITICAL  LEFT LOWER LIMB ISCHEMIA  POST-OPERATIVE DIAGNOSIS:  CRITICAL  LEFT LOWER LIMB ISCHEMIA  PROCEDURE:   1) right iliofemoral endarterectomy and bovine pericardial patch angioplasty 2) right to left femoral-femoral bypass grafting with 6 mm externally supported PTFE 3) left femoral to above-knee popliteal artery bypass grafting with 6 mm externally supported PTFE 4) bilateral lower extremity angiography (35cc contrast)  SURGEON:  Surgeon(s) and Role:    * Leonie Douglas, MD - Primary  ASSISTANT:  Cephus Shelling, MD - Assisting  Wendi Maya, PA-C  Assistants were needed to facilitate exposure and expedite the case.  ANESTHESIA:   general  EBL:  300 mL   BLOOD ADMINISTERED:none  DRAINS: none   LOCAL MEDICATIONS USED:  NONE  SPECIMEN:  none  DISPOSITION OF SPECIMEN:  n/a  COUNTS: confirmed correct.  TOURNIQUET:  None  PLAN OF CARE: Admit to inpatient   PATIENT DISPOSITION:  PACU - hemodynamically stable.   Delay start of Pharmacological VTE agent (>24hrs) due to surgical blood loss or risk of bleeding: no  INDICATION FOR PROCEDURE: [name] is a 69 y.o. female with atherosclerosis of native arteries of left lower extremity causing ulceration.  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 After careful discussion of risks, benefits, and alternatives the patient was offered right to left femoral-femoral bypass and left femoral popliteal bypass. We specifically discussed risk of heart attack, stroke, wound complication, graft infection. The patient understood and wished to proceed.  FINDINGS: Both common femoral arteries supposed through oblique incisions in the groins.  Posterior plaque was identified in both groins, but  this did not appear to be flow-limiting initially.  The above-knee popliteal artery was exposed through a medial incision and found to be healthy.  A subcutaneous femoral-femoral tunnel was made digitally.  An anatomic left femoral to above-knee popliteal tunnel was made with a sheath tunneling device.  Completion angiography revealed disturbance of flow proximal to our right common femoral anastomosis.  The anastomosis was taken down and the posterior plaque found to have been fractured.  Right iliofemoral endarterectomy and patch angioplasty was performed.  Excellent Doppler flow was noted in the foot after this.  DESCRIPTION OF PROCEDURE: After identification of the patient in the pre-operative holding area, the patient was transferred to the operating room. The patient was positioned supine on the operating room table. Anesthesia was induced. The abdomen, groins, left leg, right thigh were prepped and draped in standard fashion. A surgical pause was performed confirming correct patient, procedure, and operative location.  To begin we mapped the course of the femoral arteries using intraoperative ultrasound.  A oblique incision was planned over the bilateral groins along Langer's lines.  Similarly we mapped the above-knee popliteal artery on the left and land an incision to facilitate exposure.  An incision was made over the right common femoral artery.  This was carried down to the femoral sheath using Bovie electrocautery.  Femoral sheath was opened sharply.  Femoral arteries were skeletonized and encircled with Silastic Vesseloops.  Posterior plaque was noted.  Incision was made over the left common femoral artery.  This was carried down to the femoral sheath using Bovie electrocautery. Femoral sheath was opened sharply.  Femoral arteries were skeletonized and encircled with Silastic Vesseloops.  Posterior plaque was noted.  Incision was made over the left above-knee popliteal artery.  Incision was  carried down until the sartorius muscle was identified and rotated posteriorly.  The above-knee popliteal neurovascular bundle was identified.  The artery was skeletonized and encircled with Silastic Vesseloops.  A cross femoral tunnel was made with digital dissection a subcutaneous plane.  A long aortic cross clamp was then used to deliver a 6 mm externally supported PTFE vascular graft through the tunnel.  Sheath, curved tunneling device was then used to create an anatomic tunnel connecting the left femoral exposure to the left above-knee popliteal artery exposure.  A 6 mm externally supported PTFE vascular graft was then delivered through the tunnel.  The patient was systemically heparinized.  Patient was reheparinized every hour as the case proceeded.  No clotting was noted in the surgical beds while the patient was heparinized.  ACT monitoring was not used.  The right common femoral artery, superficial femoral artery, profunda femoris artery were clamped.  Anterior arteriotomy was made with an 11 blade.  This was extended with Potts scissors.  Posterior plaque was again identified.  This did not appear to be flow-limiting in any way.  The previously tunneled vascular graft was spatulated to allow a generous end-to-side anastomosis.  This was performed in continuous running fashion with 6-0 Prolene suture.  Clamps were released on the femoral arteries and transitioned to the graft.  Hemostasis was ensured in the anastomosis.  The left common femoral artery, superficial femoral artery, profunda femoris artery were clamped.  Anterior arteriotomy was made with an 11 blade.  This was extended with Potts scissors.  Posterior plaque was again identified.  This did not appear to be flow-limiting in any way.  The previously tunneled vascular graft was spatulated to allow a generous end-to-side anastomosis.  This was performed in continuous running fashion with 6-0 Prolene suture.  Clamps were released on the  femoral arteries.  Hemostasis was ensured in the anastomosis.  Clamps were then reapplied to the femoral arteries on the left.  A graftotomy was made in the hood of the femoral-femoral bypass graft on the left and extended with Potts scissors.  The proximal end of the femoral-popliteal vascular graft was spatulated and anastomosed end-to-side to this graftotomy using continuous running 6-0 Prolene suture.  Clamps were released and hemostasis was assured in the anastomosis.  The above-knee popliteal artery was clamped proximally and distally.  An anterior medial arteriotomy was made with 11 blade.  This was extended with Potts scissors.  The distal aspect of the femoral-popliteal graft was spatulated to allow for a anastomosis to lay without tension.  The graft was then anastomosed end-to-side to the popliteal artery using continuous running suture of 6-0 Gore-Tex.  Clamps were again released and hemostasis ensured.  An angiography was performed by introducing a 4 French sheath into the hood of the right common femoral anastomosis.  Angiography of the right common femoral anastomosis, left common femoral anastomosis, left proximal femoral to popliteal anastomosis, and distal left femoral to popliteal anastomosis performed.  A deficit was noted in the distal external iliac artery immediately above the right common femoral anastomosis.  We felt this needed immediate correction.  I extended the incision on the right side and exposed the external iliac artery to allow for more proximal clamp site.  A Fogarty hydrogrip vascular clamp was then applied to the still external iliac artery anterior posterior.  Clamps were applied to the superficial femoral artery and profunda femoris arteries.  The previous  anastomosis was taken down.  The femoral arteriotomy was extended onto the distal external iliac artery.  A clear fracture in the posterior plaque was noted likely from previous clamping efforts.  A iliofemoral  endarterectomy was then performed with a Runner, broadcasting/film/video.  The intermedia and intima were removed completely from the affected segment into the femoral bifurcation.  The profunda orifice was carefully inspected and excellent backbleeding confirmed to ensure no dissection or other injury had occurred to this critical artery.  Proximal plaque was then tacked to the posterior wall using 6-0 Prolene suture.  Bovine patch was brought onto the field.  This was used to perform patch angioplasty of the distal external iliac and common femoral artery.  Immediately prior to completion the patch repair was de-aired and flushed.  The patch was then completed.  A small arteriotomy was made in the patch with an 11 blade extended with Potts scissors.  The proximal aspect of the vascular graft was respatulated and anastomosed end to side to the patch angioplasty using continuous running suture of 6-0 Gore-Tex.  Immediately prior to completion the anastomosis and graft were flushed and de-aired.  The anastomosis was completed.  Hemostasis was achieved.  Then interrogated the distal popliteal artery and tibial vessels using a Doppler machine.  Excellent nearly triphasic flow was noted immediately distal to the bypass which obliterated with occlusion of the bypass.  Similarly nearly triphasic flow was heard in the posterior tibial and anterior tibial arteries of the left lower extremity.  Doppler flow was confirmed in the right tibial vessels after completion of the case.  Satisfied we ended the case here.  Heparin was reversed with protamine.  The groin incisions were closed in layers using 2-0 Vicryl, 3-0 Vicryl, 4-0 Monocryl.  The above-knee popliteal artery was closed in layers using 2-0 Vicryl, 3-0 Vicryl, 4-0 Monocryl.  Sterile dressings were applied.  Upon completion of the case instrument and sharps counts were confirmed correct. The patient was transferred to the PACU in good condition. I was present for all  portions of the procedure.  Rande Brunt. Lenell Antu, MD [facility] Phone Number: [phone] 05/10/2020 4:49 PM

## 2020-05-10 NOTE — Anesthesia Procedure Notes (Signed)
Procedure Name: Intubation Date/Time: 05/10/2020 9:38 AM Performed by: Pearson Grippe, CRNA Pre-anesthesia Checklist: Patient identified, Emergency Drugs available, Suction available and Patient being monitored Patient Re-evaluated:Patient Re-evaluated prior to induction Oxygen Delivery Method: Circle system utilized Preoxygenation: Pre-oxygenation with 100% oxygen Induction Type: IV induction Ventilation: Mask ventilation without difficulty Laryngoscope Size: Miller and 2 Grade View: Grade I Tube type: Oral Tube size: 7.0 mm Number of attempts: 1 Airway Equipment and Method: Stylet and Oral airway Placement Confirmation: ETT inserted through vocal cords under direct vision,  positive ETCO2 and breath sounds checked- equal and bilateral Secured at: 21 cm Tube secured with: Tape Dental Injury: Teeth and Oropharynx as per pre-operative assessment

## 2020-05-10 NOTE — Anesthesia Postprocedure Evaluation (Signed)
Anesthesia Post Note  Patient: Holly Stevenson  Procedure(s) Performed: RIGHT TO LEFT FEMORAL-FEMORAL ARTERY BYPASS GRAFT (Bilateral ) LEFT FEMORAL-POPLITEAL ARTERY BYPASS GRAFT (Left ) PATCH ANGIOPLASTY USING XENOSURE PATCH (Right Groin) ENDARTERECTOMY RIGHT FEMORAL (Right Groin) LEFT LOWER EXTREMITY ANGIOGRAM (Left )     Patient location during evaluation: PACU Anesthesia Type: General Level of consciousness: awake and alert Pain management: pain level controlled Vital Signs Assessment: post-procedure vital signs reviewed and stable Respiratory status: spontaneous breathing, nonlabored ventilation and respiratory function stable Cardiovascular status: blood pressure returned to baseline and stable Postop Assessment: no apparent nausea or vomiting Anesthetic complications: no   No complications documented.  Last Vitals:  Vitals:   05/10/20 1708 05/10/20 1723  BP: (!) 133/49 (!) 149/48  Pulse: 74 70  Resp: 18 18  Temp:    SpO2: 97% 93%    Last Pain:  Vitals:   05/10/20 1723  TempSrc:   PainSc: 0-No pain    LLE Motor Response: Purposeful movement;Responds to commands (05/10/20 1723) LLE Sensation: Full sensation (05/10/20 1723) RLE Motor Response: Purposeful movement;Responds to commands (05/10/20 1723) RLE Sensation: Full sensation (05/10/20 1723)      Ailine Hefferan,W. EDMOND

## 2020-05-10 NOTE — Anesthesia Preprocedure Evaluation (Addendum)
Anesthesia Evaluation  Patient identified by MRN, date of birth, ID band Patient awake    Reviewed: Allergy & Precautions, H&P , NPO status , Patient's Chart, lab work & pertinent test results  Airway Mallampati: II  TM Distance: >3 FB Neck ROM: Full    Dental no notable dental hx. (+) Edentulous Upper, Edentulous Lower, Dental Advisory Given   Pulmonary neg pulmonary ROS, Current Smoker and Patient abstained from smoking.,    Pulmonary exam normal breath sounds clear to auscultation       Cardiovascular hypertension, Pt. on medications + Peripheral Vascular Disease   Rhythm:Regular Rate:Normal     Neuro/Psych negative neurological ROS  negative psych ROS   GI/Hepatic negative GI ROS, Neg liver ROS,   Endo/Other  diabetes, Type 2, Oral Hypoglycemic Agents  Renal/GU negative Renal ROS  negative genitourinary   Musculoskeletal  (+) Arthritis , Osteoarthritis,    Abdominal   Peds  Hematology negative hematology ROS (+)   Anesthesia Other Findings   Reproductive/Obstetrics negative OB ROS                            Anesthesia Physical Anesthesia Plan  ASA: III  Anesthesia Plan: General   Post-op Pain Management:    Induction: Intravenous  PONV Risk Score and Plan: 3 and Ondansetron, Dexamethasone and Midazolam  Airway Management Planned: Oral ETT  Additional Equipment:   Intra-op Plan:   Post-operative Plan: Extubation in OR  Informed Consent: I have reviewed the patients History and Physical, chart, labs and discussed the procedure including the risks, benefits and alternatives for the proposed anesthesia with the patient or authorized representative who has indicated his/her understanding and acceptance.     Dental advisory given  Plan Discussed with: CRNA  Anesthesia Plan Comments:         Anesthesia Quick Evaluation

## 2020-05-11 LAB — CBC
HCT: 31.2 % — ABNORMAL LOW (ref 36.0–46.0)
Hemoglobin: 10.2 g/dL — ABNORMAL LOW (ref 12.0–15.0)
MCH: 30.5 pg (ref 26.0–34.0)
MCHC: 32.7 g/dL (ref 30.0–36.0)
MCV: 93.4 fL (ref 80.0–100.0)
Platelets: 159 10*3/uL (ref 150–400)
RBC: 3.34 MIL/uL — ABNORMAL LOW (ref 3.87–5.11)
RDW: 13.2 % (ref 11.5–15.5)
WBC: 11.9 10*3/uL — ABNORMAL HIGH (ref 4.0–10.5)
nRBC: 0 % (ref 0.0–0.2)

## 2020-05-11 LAB — GLUCOSE, CAPILLARY
Glucose-Capillary: 203 mg/dL — ABNORMAL HIGH (ref 70–99)
Glucose-Capillary: 251 mg/dL — ABNORMAL HIGH (ref 70–99)
Glucose-Capillary: 220 mg/dL — ABNORMAL HIGH (ref 70–99)

## 2020-05-11 LAB — LIPID PANEL
Cholesterol: 119 mg/dL (ref 0–200)
HDL: 31 mg/dL — ABNORMAL LOW (ref >40)
LDL Cholesterol: 48 mg/dL (ref 0–99)
Total CHOL/HDL Ratio: 3.8 RATIO
Triglycerides: 201 mg/dL — ABNORMAL HIGH (ref <150)
VLDL: 40 mg/dL (ref 0–40)

## 2020-05-11 LAB — BASIC METABOLIC PANEL
Anion gap: 14 (ref 5–15)
BUN: 18 mg/dL (ref 8–23)
CO2: 21 mmol/L — ABNORMAL LOW (ref 22–32)
Calcium: 8.4 mg/dL — ABNORMAL LOW (ref 8.9–10.3)
Chloride: 101 mmol/L (ref 98–111)
Creatinine, Ser: 0.79 mg/dL (ref 0.44–1.00)
GFR, Estimated: >60 mL/min (ref >60)
Glucose, Bld: 184 mg/dL — ABNORMAL HIGH (ref 70–99)
Potassium: 4.4 mmol/L (ref 3.5–5.1)
Sodium: 136 mmol/L (ref 135–145)

## 2020-05-11 LAB — HEMOGLOBIN A1C
Hgb A1c MFr Bld: 7.2 % — ABNORMAL HIGH (ref 4.8–5.6)
Mean Plasma Glucose: 159.94 mg/dL

## 2020-05-11 MED ORDER — INSULIN ASPART 100 UNIT/ML ~~LOC~~ SOLN
0.0000 [IU] | Freq: Three times a day (TID) | SUBCUTANEOUS | Status: DC
  Administered 2020-05-11: 5 [IU] via SUBCUTANEOUS
  Administered 2020-05-11: 8 [IU] via SUBCUTANEOUS
  Administered 2020-05-12: 5 [IU] via SUBCUTANEOUS
  Administered 2020-05-12: 8 [IU] via SUBCUTANEOUS
  Administered 2020-05-12: 5 [IU] via SUBCUTANEOUS
  Administered 2020-05-13: 3 [IU] via SUBCUTANEOUS

## 2020-05-11 MED ORDER — INSULIN ASPART 100 UNIT/ML ~~LOC~~ SOLN
0.0000 [IU] | Freq: Every day | SUBCUTANEOUS | Status: DC
  Administered 2020-05-11 – 2020-05-12 (×2): 2 [IU] via SUBCUTANEOUS

## 2020-05-11 MED FILL — Thrombin (Recombinant) For Soln 20000 Unit: CUTANEOUS | Qty: 1 | Status: AC

## 2020-05-11 NOTE — Evaluation (Signed)
Physical Therapy Evaluation Patient Details Name: Holly Stevenson MRN: [id number] DOB: 05/04/1951 Today's Date: 05/11/2020   History of Present Illness  Pt is a 69 year old female who presented for elective surgery with diagnosis of critical left lower limb ischemia. The following procedure was performed 05/10/20: right to left femoral-femoral artery bypass graft (bilateral) left femoral-popliteal artery bypass graft (left). PMH: pneumonia, HTN, high cholesterol, and DM.  Clinical Impression  Pt presents with condition mentioned above and deficits mentioned below, see PT Problem List below. Pt lives alone and has a flight of stairs with bilat rails she has to negotiate to enter/exit her home along with another flight to reach her bedroom and bathroom. She is independent without use of AD/AE at baseline with functional mobility. Currently, pt demonstrates some mild balance and gait deficits and uses a RW to ambulate and requires min guard assist for safety. This is most likely secondary to pain from surgery and it is expected that she will return her PLOF without use of a RW to ambulate as pain decreases. Thus, no PT follow up after d/c needed at this time. Will continue to follow acutely.      Follow Up Recommendations No PT follow up    Equipment Recommendations  None recommended by PT    Recommendations for Other Services       Precautions / Restrictions Precautions Precautions: Fall Restrictions Weight Bearing Restrictions: No      Mobility  Bed Mobility Overal bed mobility: Modified Independent             General bed mobility comments: Pt sitting up in recliner upon arrival    Transfers Overall transfer level: Needs assistance Equipment used: Rolling walker (2 wheeled) Transfers: Sit to/from Stand Sit to Stand: Min guard         General transfer comment: Min guard for safety with slightly increased time to power up to stand with mild trunk sway  noted.  Ambulation/Gait Ambulation/Gait assistance: Min guard Gait Distance (Feet): 300 Feet Assistive device: Rolling walker (2 wheeled) Gait Pattern/deviations: Step-through pattern;Decreased stride length;Antalgic;Trunk flexed Gait velocity: decreased Gait velocity interpretation: >2.62 ft/sec, indicative of community ambulatory General Gait Details: Pt able to change head positions and gait speed without LOB or increased trunk sway. Required cues to remain within RW and to negotiate through tight spaces as she would bump obstacles in tight spaces.  Stairs            Wheelchair Mobility    Modified Rankin (Stroke Patients Only)       Balance Overall balance assessment: Needs assistance   Sitting balance-Leahy Scale: Good     Standing balance support: Bilateral upper extremity supported;During functional activity Standing balance-Leahy Scale: Poor Standing balance comment: UEs on RW with standing tasks. No LOB, min guard for safety.                             Pertinent Vitals/Pain Pain Assessment: 0-10 Pain Score: 5  Faces Pain Scale: Hurts whole lot Pain Location: groin Pain Descriptors / Indicators: Guarding;Grimacing;Burning Pain Intervention(s): Limited activity within patient's tolerance;Monitored during session;Repositioned    Home Living Family/patient expects to be discharged to:: Private residence Living Arrangements: Alone Available Help at Discharge: Family;Available 24 hours/day Type of Home: House Home Access: Stairs to enter Entrance Stairs-Rails: Can reach both Entrance Stairs-Number of Steps: flight Home Layout: Two level;Other (Comment);1/2 bath on main level;Bed/bath upstairs (garage under home) Home Equipment: Dan Humphreys -  2 wheels;Cane - single point;Crutches      Prior Function Level of Independence: Independent         Comments: works as an Immunologist, drives     Higher education careers adviser   Dominant Hand: Right     Extremity/Trunk Assessment   Upper Extremity Assessment Upper Extremity Assessment: Defer to OT evaluation    Lower Extremity Assessment Lower Extremity Assessment: RLE deficits/detail;LLE deficits/detail RLE Deficits / Details: MMT scores = hip flexion 4+, knee extension 5, knee flexion 4, ankle dorsiflexion 4+ RLE Sensation: decreased light touch;history of peripheral neuropathy (on dorsal foot and toe) RLE Coordination: WNL LLE Deficits / Details: MMT scores = hip flexion 4+, knee extension 5, knee flexion 4, ankle dorsiflexion 4+ LLE Sensation: decreased light touch;history of peripheral neuropathy (on dorsal foot and toe) LLE Coordination: WNL    Cervical / Trunk Assessment Cervical / Trunk Assessment: Normal  Communication   Communication: No difficulties  Cognition Arousal/Alertness: Awake/alert Behavior During Therapy: WFL for tasks assessed/performed Overall Cognitive Status: Within Functional Limits for tasks assessed                                 General Comments: A&Ox4.      General Comments General comments (skin integrity, edema, etc.): BP start of session sitting 146/41, standing 101/41, end of session sitting after gait bout 155/41; denied signs and symptoms of changes in BP or low BP    Exercises     Assessment/Plan    PT Assessment Patient needs continued PT services  PT Problem List Decreased strength;Decreased activity tolerance;Decreased balance;Decreased mobility;Decreased knowledge of use of DME;Impaired sensation;Pain       PT Treatment Interventions DME instruction;Gait training;Stair training;Functional mobility training;Therapeutic activities;Therapeutic exercise;Balance training;Neuromuscular re-education    PT Goals (Current goals can be found in the Care Plan section)  Acute Rehab PT Goals Patient Stated Goal: go home PT Goal Formulation: With patient Time For Goal Achievement: 05/25/20 Potential to Achieve Goals: Good     Frequency Min 3X/week   Barriers to discharge        Co-evaluation               AM-PAC PT "6 Clicks" Mobility  Outcome Measure Help needed turning from your back to your side while in a flat bed without using bedrails?: None Help needed moving from lying on your back to sitting on the side of a flat bed without using bedrails?: None Help needed moving to and from a bed to a chair (including a wheelchair)?: A Little Help needed standing up from a chair using your arms (e.g., wheelchair or bedside chair)?: A Little Help needed to walk in hospital room?: A Little Help needed climbing 3-5 steps with a railing? : A Little 6 Click Score: 20    End of Session Equipment Utilized During Treatment: Gait belt Activity Tolerance: Patient tolerated treatment well Patient left: in chair;with call bell/phone within reach Nurse Communication: Mobility status;Other (comment) (BP changes) PT Visit Diagnosis: Unsteadiness on feet (R26.81);Other abnormalities of gait and mobility (R26.89);Muscle weakness (generalized) (M62.81);Pain Pain - Right/Left:  (groin) Pain - part of body:  (groin)    Time: 7517-0017 PT Time Calculation (min) (ACUTE ONLY): 24 min   Charges:   PT Evaluation $PT Eval Low Complexity: 1 Low PT Treatments $Gait Training: 8-22 mins        Raymond Gurney, PT, DPT Acute Rehabilitation Services  Pager: [phone] Office: [phone]  Henrene Dodge Pettis 05/11/2020, 1:45 PM

## 2020-05-11 NOTE — Progress Notes (Addendum)
[facility]  Subjective  - Alert and oriented x 3 relatively comfortable.   Objective (!) 142/46 63 98.5 F (36.9 C) (Oral) 19 94%  Intake/Output Summary (Last 24 hours) at 05/11/2020 0905 Last data filed at 05/11/2020 0600 Gross per 24 hour  Intake 3796.82 ml  Output 2385 ml  Net 1411.82 ml    Doppler signals DP/PT B LE Incisions with clean dry dressings, surrounding tissue soft without hematomas Heart RRR Lungs non labored breathing  Assessment/Planning: PAD with left LE critical limb ischemia POD # 1  PROCEDURE:   1) right iliofemoral endarterectomy and bovine pericardial patch angioplasty 2) right to left femoral-femoral bypass grafting with 6 mm externally supported PTFE 3) left femoral to above-knee popliteal artery bypass grafting with 6 mm externally supported PTFE 4) bilateral lower extremity angiography (35cc contrast)  HGB stable, non labored breathing, Heart RRR Pending mobility and pain control with patent arterial flow B LE  Mosetta Pigeon 05/11/2020 9:05 AM --  Laboratory Lab Results: Recent Labs    05/10/20 1454 05/11/20 0406  WBC  --  11.9*  HGB 11.2* 10.2*  HCT 33.0* 31.2*  PLT  --  159   BMET Recent Labs    05/10/20 1454  NA 138  K 4.3  CL 103  GLUCOSE 268*  BUN 16  CREATININE 0.80    COAG Lab Results  Component Value Date   INR 1.0 05/03/2020   INR 1.0 04/25/2020   No results found for: PTT   VASCULAR STAFF ADDENDUM: I have independently interviewed and examined the patient. I agree with the above.  Doing well POD#1 s/p R-->L fem-fem and L fem-AK pop bypass with 110mm ePTFE Brisk DS in feet bilaterally Dressings clean and dry PT/OT/OOB/Ambulate  Rande Brunt. Lenell Antu, MD [facility] Phone Number: [phone] 05/11/2020 4:00 PM

## 2020-05-11 NOTE — Progress Notes (Signed)
PHARMACIST LIPID MONITORING   Holly Stevenson is a 69 y.o. female admitted on 05/10/2020 for R to L femoral-femoral artery BPG, L fem-pop BPG, R femoral endarterectomy, L LE angiogram.  Pharmacy has been consulted to optimize lipid-lowering therapy with the indication of secondary prevention for clinical ASCVD.  Recent Labs:  Lipid Panel (last 6 months):   Lab Results  Component Value Date   CHOL 119 05/11/2020   TRIG 201 (H) 05/11/2020   HDL 31 (L) 05/11/2020   CHOLHDL 3.8 05/11/2020   VLDL 40 05/11/2020   LDLCALC 48 05/11/2020    Hepatic function panel (last 6 months):   Lab Results  Component Value Date   AST 34 05/03/2020   ALT 45 (H) 05/03/2020   ALKPHOS 70 05/03/2020   BILITOT 0.9 05/03/2020    SCr (since admission):   Serum creatinine: 0.8 mg/dL 08/67/61 9509 Estimated creatinine clearance: 73.4 mL/min  Current lipid-lowering therapy: atorvastatin 20 mg  Previous lipid-lowering therapies (if applicable): n/a Documented or reported allergies or intolerances to lipid-lowering therapies (if applicable): none  Assessment:  Patient prefers no changes in lipid-lowering therapy at this time due to LDL currently controlled and ok with her docotor, and is tolerationg at current dose.   Recommendation per protocol:  Continue current lipid-lowering therapy.  Follow-up with:  Primary care provider - Maurice Small, MD  Follow-up labs after discharge:    Liver function panel and lipid panel in 8-12 weeks then annually  Plan: No changes at this time.   Trixie Rude, PharmD PGY1 Acute Care Pharmacy Resident 05/11/2020 11:46 AM  Please check [email or web address] for unit-specific pharmacy phone numbers.

## 2020-05-11 NOTE — Evaluation (Signed)
Occupational Therapy Evaluation Patient Details Name: Holly Stevenson MRN: [id number] DOB: [birth year] Today's Date: 05/11/2020    History of Present Illness Pt is a 69 year old woman admitted on 05/10/20 for R to L femoral-femoral artery BPG, L fem-pop BPG, R femoral endarterectomy, L LE angiogram. PMH: DM, HLD, HTN, smoker, PVD, OA.   Clinical Impression   Pt is typically independent. Pt limited by incision pain, but able to perform bed mobility modified independently and OOB with RW with min guard assist. She requires set up to moderate assistance for ADL. Pt can stay with her daughter if necessary upon discharge, but prefers return to her 2 story home with intermittent assist. Will follow acutely.    Follow Up Recommendations  No OT follow up    Equipment Recommendations  3 in 1 bedside commode    Recommendations for Other Services       Precautions / Restrictions Precautions Precautions: Fall      Mobility Bed Mobility Overal bed mobility: Modified Independent             General bed mobility comments: HOB up    Transfers Overall transfer level: Needs assistance Equipment used: Rolling walker (2 wheeled) Transfers: Sit to/from Stand Sit to Stand: Min guard         General transfer comment: cues for hand placement, increased time    Balance Overall balance assessment: Needs assistance   Sitting balance-Leahy Scale: Good     Standing balance support: Bilateral upper extremity supported Standing balance-Leahy Scale: Poor                             ADL either performed or assessed with clinical judgement   ADL Overall ADL's : Needs assistance/impaired Eating/Feeding: Independent   Grooming: Set up;Sitting   Upper Body Bathing: Set up;Sitting   Lower Body Bathing: Moderate assistance;Sit to/from stand   Upper Body Dressing : Set up;Sitting   Lower Body Dressing: Moderate assistance;Sit to/from stand   Toilet Transfer: Min  guard;Ambulation;RW;BSC   Toileting- Clothing Manipulation and Hygiene: Minimal assistance;Sit to/from stand       Functional mobility during ADLs: Min guard;Rolling walker       Vision Patient Visual Report: No change from baseline       Perception     Praxis      Pertinent Vitals/Pain Pain Assessment: Faces Faces Pain Scale: Hurts whole lot Pain Location: incisions Pain Descriptors / Indicators: Aching;Burning;Grimacing;Guarding Pain Intervention(s): Monitored during session     Hand Dominance Right   Extremity/Trunk Assessment Upper Extremity Assessment Upper Extremity Assessment: Overall WFL for tasks assessed   Lower Extremity Assessment Lower Extremity Assessment: Defer to PT evaluation   Cervical / Trunk Assessment Cervical / Trunk Assessment: Normal   Communication Communication Communication: No difficulties   Cognition Arousal/Alertness: Awake/alert Behavior During Therapy: WFL for tasks assessed/performed Overall Cognitive Status: Within Functional Limits for tasks assessed                                     General Comments       Exercises     Shoulder Instructions      Home Living Family/patient expects to be discharged to:: Private residence Living Arrangements: Alone Available Help at Discharge: Family;Available 24 hours/day Type of Home: House Home Access: Stairs to enter [employer] of Steps: flight Entrance Stairs-Rails: Right;Left Home  Layout: Two level;Other (Comment) (garage under home) Alternate Level Stairs-Number of Steps: flight Alternate Level Stairs-Rails: Right;Left Bathroom Shower/Tub: Producer, television/film/video: Handicapped height     Home Equipment: Environmental consultant - 2 wheels;Cane - single point;Crutches          Prior Functioning/Environment Level of Independence: Independent        Comments: works as an Immunologist, drives        OT Problem List: Impaired balance  (sitting and/or standing);Decreased knowledge of use of DME or AE;Pain      OT Treatment/Interventions: Self-care/ADL training;DME and/or AE instruction;Patient/family education;Balance training;Therapeutic activities    OT Goals(Current goals can be found in the care plan section) Acute Rehab OT Goals Patient Stated Goal: go home OT Goal Formulation: With patient Time For Goal Achievement: 05/25/20 Potential to Achieve Goals: Good ADL Goals Pt Will Perform Grooming: with modified independence;standing Pt Will Perform Lower Body Bathing: with modified independence;with adaptive equipment;sit to/from stand Pt Will Perform Lower Body Dressing: with modified independence;sit to/from stand;with adaptive equipment Pt Will Transfer to Toilet: with modified independence;ambulating;bedside commode (over toilet) Pt Will Perform Toileting - Clothing Manipulation and hygiene: with modified independence;sit to/from stand Pt Will Perform Tub/Shower Transfer: with supervision;ambulating;3 in 1;rolling walker  OT Frequency: Min 2X/week   Barriers to D/C:            Co-evaluation              AM-PAC OT "6 Clicks" Daily Activity     Outcome Measure Help from another person eating meals?: None Help from another person taking care of personal grooming?: A Little Help from another person toileting, which includes using toliet, bedpan, or urinal?: A Little Help from another person bathing (including washing, rinsing, drying)?: A Lot Help from another person to put on and taking off regular upper body clothing?: A Little Help from another person to put on and taking off regular lower body clothing?: A Lot 6 Click Score: 17   End of Session Equipment Utilized During Treatment: Rolling walker;Gait belt Nurse Communication: Mobility status;Patient requests pain meds  Activity Tolerance: Patient tolerated treatment well Patient left: in chair;with call bell/phone within reach  OT Visit  Diagnosis: Unsteadiness on feet (R26.81);Other abnormalities of gait and mobility (R26.89);Pain                Time: 1024-1050 OT Time Calculation (min): 26 min Charges:  OT General Charges $OT Visit: 1 Visit OT Evaluation $OT Eval Moderate Complexity: 1 Mod OT Treatments $Self Care/Home Management : 8-22 mins  Martie Round, OTR/L Acute Rehabilitation Services Pager: [phone] Office: [phone]  Evern Bio 05/11/2020, 11:03 AM

## 2020-05-12 ENCOUNTER — Encounter (HOSPITAL_COMMUNITY): Payer: Self-pay | Admitting: Vascular Surgery

## 2020-05-12 LAB — GLUCOSE, CAPILLARY
Glucose-Capillary: 210 mg/dL — ABNORMAL HIGH (ref 70–99)
Glucose-Capillary: 280 mg/dL — ABNORMAL HIGH (ref 70–99)
Glucose-Capillary: 203 mg/dL — ABNORMAL HIGH (ref 70–99)
Glucose-Capillary: 205 mg/dL — ABNORMAL HIGH (ref 70–99)

## 2020-05-12 MED ORDER — ATORVASTATIN CALCIUM 40 MG PO TABS
40.0000 mg | ORAL_TABLET | Freq: Every day | ORAL | Status: DC
  Administered 2020-05-13: 40 mg via ORAL
  Filled 2020-05-12: qty 1

## 2020-05-12 NOTE — Progress Notes (Signed)
OT Cancellation Note  Patient Details Name: Geana Walts MRN: [id number] DOB: September 04, 1950   Cancelled Treatment:    Reason Eval/Treat Not Completed: Medical issues which prohibited therapy (Pt with nausea and vomiting. Will continue to follow.)  Evern Bio 05/12/2020, 1:01 PM  Martie Round, OTR/L Acute Rehabilitation Services Pager: [phone] Office: [phone]

## 2020-05-12 NOTE — Progress Notes (Signed)
Physical Therapy Treatment Patient Details Name: Holly Stevenson MRN: [id number] DOB: [birth year] Today's Date: 05/12/2020    History of Present Illness Pt is a 69 year old female who presented for elective surgery with diagnosis of critical left lower limb ischemia. The following procedure was performed 05/10/20: right to left femoral-femoral artery bypass graft (bilateral) left femoral-popliteal artery bypass graft (left). PMH: pneumonia, HTN, high cholesterol, and DM.    PT Comments    Pt seated in recliner on arrival this session.  Pt denies feeling nauseous at start of tx, reports mild wooziness during gt and then after returning to room she began to vomit.  BP 174/50.  RN informed and into room to medicate for nausea.  Pt continues to mobilize well and remains on track to return home.  Able to perform stair training this session.     Follow Up Recommendations  No PT follow up     Equipment Recommendations  None recommended by PT    Recommendations for Other Services       Precautions / Restrictions Precautions Precautions: Fall    Mobility  Bed Mobility               General bed mobility comments: Pt sitting up in recliner upon arrival  Transfers Overall transfer level: Needs assistance Equipment used: Rolling walker (2 wheeled) Transfers: Sit to/from Stand Sit to Stand: Min guard         General transfer comment: Min guard for safety with slightly increased time to power up to stand with mild trunk sway noted.  Pt with ppoor eccentric load and required cues to advance LLE forward to improve ease and decrease her pain.  Ambulation/Gait Ambulation/Gait assistance: Min guard;Supervision Gait Distance (Feet): 100 Feet (limited due to nausea and mild wooziness.) Assistive device: Rolling walker (2 wheeled) Gait Pattern/deviations: Step-through pattern;Decreased stride length;Antalgic;Trunk flexed     General Gait Details: Cues for posture and safety with  RW to keep both feet inside device.  Pt tolerated gt training well.   Stairs Stairs: Yes Stairs assistance: Supervision Stair Management: Two rails;Backwards;Forwards Number of Stairs: 6 General stair comments: Cues for sequencing and safety.  Forwards to ascend and backwards to descend.   Wheelchair Mobility    Modified Rankin (Stroke Patients Only)       Balance Overall balance assessment: Needs assistance   Sitting balance-Leahy Scale: Good       Standing balance-Leahy Scale: Poor Standing balance comment: UEs on RW with standing tasks. No LOB, min guard for safety.                            Cognition Arousal/Alertness: Awake/alert Behavior During Therapy: WFL for tasks assessed/performed Overall Cognitive Status: Within Functional Limits for tasks assessed                                 General Comments: A&Ox4.      Exercises      General Comments        Pertinent Vitals/Pain Pain Assessment: 0-10 Pain Score: 5  Pain Location: groin L > R Pain Descriptors / Indicators: Guarding;Grimacing;Burning Pain Intervention(s): Monitored during session;Repositioned    Home Living                      Prior Function            PT  Goals (current goals can now be found in the care plan section) Acute Rehab PT Goals Patient Stated Goal: go home Potential to Achieve Goals: Good Progress towards PT goals: Progressing toward goals    Frequency    Min 3X/week      PT Plan Current plan remains appropriate    Co-evaluation              AM-PAC PT "6 Clicks" Mobility   Outcome Measure  Help needed turning from your back to your side while in a flat bed without using bedrails?: None Help needed moving from lying on your back to sitting on the side of a flat bed without using bedrails?: None Help needed moving to and from a bed to a chair (including a wheelchair)?: A Little Help needed standing up from a chair  using your arms (e.g., wheelchair or bedside chair)?: A Little Help needed to walk in hospital room?: A Little Help needed climbing 3-5 steps with a railing? : None 6 Click Score: 21    End of Session Equipment Utilized During Treatment: Gait belt Activity Tolerance: Patient tolerated treatment well Patient left: in chair;with call bell/phone within reach Nurse Communication: Mobility status;Other (comment) PT Visit Diagnosis: Unsteadiness on feet (R26.81);Other abnormalities of gait and mobility (R26.89);Muscle weakness (generalized) (M62.81);Pain Pain - Right/Left:  (groin)     Time: 0962-8366 PT Time Calculation (min) (ACUTE ONLY): 23 min  Charges:  $Gait Training: 8-22 mins $Therapeutic Activity: 8-22 mins                     Bonney Leitz , PTA Acute Rehabilitation Services Pager [phone] Office [phone]     Trevante Tennell Artis Delay 05/12/2020, 12:35 PM

## 2020-05-12 NOTE — Discharge Instructions (Signed)
 [facility]  Discharge instructions  Lower Extremity Bypass Surgery  Please refer to the following instruction for your post-procedure care. Your surgeon or physician assistant will discuss any changes with you.  Activity  You are encouraged to walk as much as you can. You can slowly return to normal activities during the month after your surgery. Avoid strenuous activity and heavy lifting until your doctor tells you it's OK. Avoid activities such as vacuuming or swinging a golf club. Do not drive until your doctor give the OK and you are no longer taking prescription pain medications. It is also normal to have difficulty with sleep habits, eating and bowel movement after surgery. These will go away with time.  Bathing/Showering  You may shower after you go home. Do not soak in a bathtub, hot tub, or swim until the incision heals completely.  Incision Care  Clean your incision with mild soap and water. Shower every day. Pat the area dry with a clean towel. You do not need a bandage unless otherwise instructed. Do not apply any ointments or creams to your incision. If you have open wounds you will be instructed how to care for them or a visiting nurse may be arranged for you. If you have staples or sutures along your incision they will be removed at your post-op appointment. You may have skin glue on your incision. Do not peel it off. It will come off on its own in about one week. If you have a great deal of moisture in your groin, use a gauze help keep this area dry.  Diet  Resume your normal diet. There are no special food restrictions following this procedure. A low fat/ low cholesterol diet is recommended for all patients with vascular disease. In order to heal from your surgery, it is CRITICAL to get adequate nutrition. Your body requires vitamins, minerals, and protein. Vegetables are the best source of vitamins and minerals. Vegetables also provide the  perfect balance of protein. Processed food has little nutritional value, so try to avoid this.  Medications  Resume taking all your medications unless your doctor or nurse practitioner tells you not to. If your incision is causing pain, you may take over-the-counter pain relievers such as acetaminophen (Tylenol). If you were prescribed a stronger pain medication, please aware these medication can cause nausea and constipation. Prevent nausea by taking the medication with a snack or meal. Avoid constipation by drinking plenty of fluids and eating foods with high amount of fiber, such as fruits, vegetables, and grains. Take Colase 100 mg (an over-the-counter stool softener) twice a day as needed for constipation. Do not take Tylenol if you are taking prescription pain medications.  Follow Up  Our office will schedule a follow up appointment 2-3 weeks following discharge.  Please call us immediately for any of the following conditions  Severe or worsening pain in your legs or feet while at rest or while walking Increase pain, redness, warmth, or drainage (pus) from your incision site(s) Fever of 101 degree or higher The swelling in your leg with the bypass suddenly worsens and becomes more painful than when you were in the hospital If you have been instructed to feel your graft pulse then you should do so every day. If you can no longer feel this pulse, call the office immediately. Not all patients are given this instruction.  Leg swelling is common after leg bypass surgery.  The swelling should improve over a few months   following surgery. To improve the swelling, you may elevate your legs above the level of your heart while you are sitting or resting. Your surgeon or physician assistant may ask you to apply an ACE wrap or wear compression (TED) stockings to help to reduce swelling.  Reduce your risk of vascular disease  Stop smoking. If you would like help call [facility] at 1-800-QUIT-NOW  (1-800-784-8669) or [facility] at 336-586-4000.  Manage your cholesterol Maintain a desired weight Control your diabetes weight Control your diabetes Keep your blood pressure down  If you have any questions, please call the office at [phone]   

## 2020-05-12 NOTE — Progress Notes (Addendum)
  Progress Note    05/12/2020 7:27 AM 2 Days Post-Op  Subjective:  Still requiring occasional IV pain medication   Vitals:   05/11/20 2019 05/12/20 0445  BP:  (!) 167/51  Pulse: 68   Resp: 20 16  Temp: 98.2 F (36.8 C) 98.4 F (36.9 C)  SpO2: 96% 96%   Physical Exam: Lungs:  Non labored Incisions:  Groin incision dressing c/d/i; no palpable hematoma;L popliteal incision without hematoma Extremities:  Palpable fem fem bypass pulse; DP and PT signals by doppler bilaterally; L 4th toe ulcer dry Neurologic: A&O  CBC    Component Value Date/Time   WBC 11.9 (H) 05/11/2020 0406   RBC 3.34 (L) 05/11/2020 0406   HGB 10.2 (L) 05/11/2020 0406   HCT 31.2 (L) 05/11/2020 0406   PLT 159 05/11/2020 0406   MCV 93.4 05/11/2020 0406   MCH 30.5 05/11/2020 0406   MCHC 32.7 05/11/2020 0406   RDW 13.2 05/11/2020 0406   LYMPHSABS 1.3 04/25/2020 0345   MONOABS 1.1 (H) 04/25/2020 0345   EOSABS 0.1 04/25/2020 0345   BASOSABS 0.0 04/25/2020 0345    BMET    Component Value Date/Time   NA 136 05/11/2020 0406   K 4.4 05/11/2020 0406   CL 101 05/11/2020 0406   CO2 21 (L) 05/11/2020 0406   GLUCOSE 184 (H) 05/11/2020 0406   BUN 18 05/11/2020 0406   CREATININE 0.79 05/11/2020 0406   CALCIUM 8.4 (L) 05/11/2020 0406   GFRNONAA >60 05/11/2020 0406   GFRAA 52 (L) 02/18/2018 1610    INR    Component Value Date/Time   INR 1.0 05/03/2020 1530     Intake/Output Summary (Last 24 hours) at 05/12/2020 0727 Last data filed at 05/12/2020 0519 Gross per 24 hour  Intake 240 ml  Output 1190 ml  Net -950 ml     Assessment/Plan:  69 y.o. female is s/p  1) right iliofemoral endarterectomy and bovine pericardial patch angioplasty 2) right to left femoral-femoral bypass grafting with 6 mm externally supported PTFE 3) left femoral to above-knee popliteal artery bypass grafting with 6 mm externally supported PTFE 2 Days Post-Op    BLE well perfused with brisk DP and PT doppler signals L 4th  toe stable; no sign of infection Incisions unremarkable No follow up recommendations based on therapy evaluation Home when weaned from IV pain medication;    Emilie Rutter, PA-C [facility] [phone] 05/12/2020 7:27 AM  VASCULAR STAFF ADDENDUM: I have independently interviewed and examined the patient. I agree with the above.  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 Lenell Antu, MD [facility] Phone Number: [phone] 05/12/2020 9:16 AM

## 2020-05-12 NOTE — Progress Notes (Signed)
PHARMACIST LIPID MONITORING   Holly Stevenson is a 69 y.o. female admitted on 05/10/2020 with PVD.  Pharmacy has been consulted to optimize lipid-lowering therapy with the indication of secondary prevention for clinical ASCVD.  Recent Labs:  Lipid Panel (last 6 months):   Lab Results  Component Value Date   CHOL 119 05/11/2020   TRIG 201 (H) 05/11/2020   HDL 31 (L) 05/11/2020   CHOLHDL 3.8 05/11/2020   VLDL 40 05/11/2020   LDLCALC 48 05/11/2020    Hepatic function panel (last 6 months):   Lab Results  Component Value Date   AST 34 05/03/2020   ALT 45 (H) 05/03/2020   ALKPHOS 70 05/03/2020   BILITOT 0.9 05/03/2020    SCr (since admission):   Serum creatinine: 0.79 mg/dL 82/95/62 1308 Estimated creatinine clearance: 73.4 mL/min  Current lipid-lowering therapy: atorvastatin 20mg /d Previous lipid-lowering therapies (if applicable): none Documented or reported allergies or intolerances to lipid-lowering therapies (if applicable): none  Assessment:  Patient agrees with changes to lipid-lowering therapy  Recommendation per protocol:  Increase intensity or dose of current statin.  Follow-up with:  Primary care provider - , MD  Follow-up labs after discharge:    Liver function panel and lipid panel in 8-12 weeks then annually  Plan: -Increase atorvastatin to 40mg  po daily  10-12, PharmD Clinical Pharmacist **Pharmacist phone directory can now be found on [email or web address] ([email or web address]).  Listed under [facility].

## 2020-05-12 NOTE — Progress Notes (Signed)
Mobility Specialist - Progress Note   05/12/20 1338  Mobility  Activity Ambulated in hall  Level of Assistance Standby assist, set-up cues, supervision of patient - no hands on  Assistive Device Front wheel walker  Distance Ambulated (ft) 140 ft  Mobility Response Tolerated well  Mobility performed by Mobility specialist  $Mobility charge 1 Mobility    Pre-mobility: 72 HR, 112/90 BP Post-mobility: 69 HR, 135/46 BP  Pt asx throughout ambulation. Pt back in chair after walk.   Mamie Levers Mobility Specialist Mobility Specialist Phone: [phone]

## 2020-05-13 LAB — GLUCOSE, CAPILLARY: Glucose-Capillary: 168 mg/dL — ABNORMAL HIGH (ref 70–99)

## 2020-05-13 MED ORDER — ATORVASTATIN CALCIUM 40 MG PO TABS
40.0000 mg | ORAL_TABLET | Freq: Every day | ORAL | Status: DC
Start: 2020-05-13 — End: 2020-07-17

## 2020-05-13 MED ORDER — ASPIRIN 81 MG PO TBEC
81.0000 mg | DELAYED_RELEASE_TABLET | Freq: Every day | ORAL | 11 refills | Status: DC
Start: 2020-05-13 — End: 2020-07-17

## 2020-05-13 MED ORDER — OXYCODONE HCL 5 MG PO TABS: 5.0000 mg | ORAL_TABLET | Freq: Four times a day (QID) | ORAL | 0 refills | Status: DC | PRN

## 2020-05-13 NOTE — Evaluation (Signed)
Occupational Therapy Evaluation Patient Details Name: Holly Stevenson MRN: [id number] DOB: 1951-04-27 Today's Date: 05/13/2020    History of Present Illness Pt is a 69 year old female who presented for elective surgery with diagnosis of critical left lower limb ischemia. The following procedure was performed 05/10/20: right to left femoral-femoral artery bypass graft (bilateral) left femoral-popliteal artery bypass graft (left). PMH: pneumonia, HTN, high cholesterol, and DM.   Clinical Impression   Patient progressing well towards OT goals. Completing transfers using RW with supervision, cueing for hand placement and safety, toileting with supervision, grooming with supervision and LB dressing with supervision.  She is able to complete figure 4 technique today to don/doff socks, no need for AE at this time.  She reports she will have support as needed at dc, plans to dc to her home.  Continue to recommend 3:1 commode for over toilet and in shower as  (educated on clearance from MD to shower prior to completion).  Will follow acutely.     Follow Up Recommendations  No OT follow up    Equipment Recommendations  3 in 1 bedside commode    Recommendations for Other Services       Precautions / Restrictions Precautions Precautions: Fall Restrictions Weight Bearing Restrictions: No      Mobility Bed Mobility               General bed mobility comments: OOB in recliner upon entry     Transfers Overall transfer level: Needs assistance Equipment used: Rolling walker (2 wheeled) Transfers: Sit to/from Stand Sit to Stand: Supervision         General transfer comment: supervision for safety, cueing for hand placement and technique     Balance Overall balance assessment: Needs assistance Sitting-balance support: No upper extremity supported;Feet supported Sitting balance-Leahy Scale: Good     Standing balance support: Bilateral upper extremity supported;No upper  extremity supported;During functional activity Standing balance-Leahy Scale: Fair Standing balance comment: relies on RW dynamically but able to complete grooming/ADLs with 0-1 hand support and supervision                            ADL either performed or assessed with clinical judgement   ADL Overall ADL's : Needs assistance/impaired     Grooming: Supervision/safety;Standing;Wash/dry hands               Lower Body Dressing: Supervision/safety;Sit to/from stand Lower Body Dressing Details (indicate cue type and reason): able to don/doff socks using figure 4 technique, discussed safety and completion in sitting  Toilet Transfer: Supervision/safety;BSC;RW;Regular Teacher, adult education Details (indicate cue type and reason): 3:1 over commode, cueing for hand placement  Toileting- Clothing Manipulation and Hygiene: Supervision/safety;Sit to/from stand Toileting - Clothing Manipulation Details (indicate cue type and reason): increased time for clothing mgmt but no assist required  Tub/ Shower Transfer: Walk-in shower;Min guard;Ambulation;Rolling walker;3 in 1 Tub/Shower Transfer Details (indicate cue type and reason): reviewed reverse step tech  Functional mobility during ADLs: Rolling walker;Supervision/safety       Vision         Perception     Praxis      Pertinent Vitals/Pain Pain Assessment: Faces Faces Pain Scale: Hurts little more Pain Location: groin L > R Pain Descriptors / Indicators: Guarding;Grimacing;Burning Pain Intervention(s): Limited activity within patient's tolerance;Monitored during session;Repositioned     Hand Dominance     Extremity/Trunk Assessment  Communication     Cognition Arousal/Alertness: Awake/alert Behavior During Therapy: WFL for tasks assessed/performed Overall Cognitive Status: Within Functional Limits for tasks assessed                                     General Comments  VSS during  session     Exercises     Shoulder Instructions      Home Living                                          Prior Functioning/Environment                   OT Problem List:        OT Treatment/Interventions:      OT Goals(Current goals can be found in the care plan section) Acute Rehab OT Goals Patient Stated Goal: go home OT Goal Formulation: With patient  OT Frequency: Min 2X/week   Barriers to D/C:            Co-evaluation              AM-PAC OT "6 Clicks" Daily Activity     Outcome Measure Help from another person eating meals?: None Help from another person taking care of personal grooming?: A Little Help from another person toileting, which includes using toliet, bedpan, or urinal?: A Little Help from another person bathing (including washing, rinsing, drying)?: A Little Help from another person to put on and taking off regular upper body clothing?: A Little Help from another person to put on and taking off regular lower body clothing?: A Little 6 Click Score: 19   End of Session Equipment Utilized During Treatment: Rolling walker Nurse Communication: Mobility status  Activity Tolerance: Patient tolerated treatment well Patient left: in chair;with call bell/phone within reach  OT Visit Diagnosis: Unsteadiness on feet (R26.81);Other abnormalities of gait and mobility (R26.89);Pain Pain - Right/Left:  (bil) Pain - part of body: Leg (groin)                Time: 8280-0349 OT Time Calculation (min): 22 min Charges:  OT General Charges $OT Visit: 1 Visit OT Treatments $Self Care/Home Management : 8-22 mins  Barry Brunner, OT Acute Rehabilitation Services Pager [phone] Office [phone]   Holly Stevenson 05/13/2020, 10:59 AM

## 2020-05-13 NOTE — Progress Notes (Signed)
[facility]  Subjective  - Sore at incision sites, but slowly improving.   Objective (!) 151/53 75 98.2 F (36.8 C) (Oral) 15 96%  Intake/Output Summary (Last 24 hours) at 05/13/2020 0825 Last data filed at 05/12/2020 2000 Gross per 24 hour  Intake 360 ml  Output --  Net 360 ml    Palpable AT left LE, doppler PT/DP right LE Palpable fe-fem bypass, left 4th toe stable dry ulcer Groin incision with clean dry dressings Heart RRR, Lungs non labored breathing  Assessment/Planning: POD # 3  69 y.o. female is s/p  1)right iliofemoral endarterectomy and bovine pericardial patch angioplasty 2)right to left femoral-femoral bypass grafting with 6 mm externally supported PTFE 3)left femoral to above-knee popliteal artery bypass grafting with 6 mm externally supported PTFE PT no home needs recommended Not requiring IV pain medication, PO pain medication with good control Plan for discharge home today under the care of her daughter.  F/U with DR. Hawken in 2-3 weeks.  Mosetta Pigeon 05/13/2020 8:25 AM --  Laboratory Lab Results: Recent Labs    05/10/20 1454 05/11/20 0406  WBC  --  11.9*  HGB 11.2* 10.2*  HCT 33.0* 31.2*  PLT  --  159   BMET Recent Labs    05/10/20 1454 05/11/20 0406  NA 138 136  K 4.3 4.4  CL 103 101  CO2  --  21*  GLUCOSE 268* 184*  BUN 16 18  CREATININE 0.80 0.79  CALCIUM  --  8.4*    COAG Lab Results  Component Value Date   INR 1.0 05/03/2020   INR 1.0 04/25/2020   No results found for: PTT

## 2020-05-16 ENCOUNTER — Telehealth: Payer: Self-pay

## 2020-05-16 NOTE — Telephone Encounter (Signed)
Patient called concerned about foot swelling post bypass surgery. Explained that it was a normal finding after surgery. She denies much pain and can move her leg. Says she is a "little weak and sore, but I'm doing alright." Incisions are healing well.

## 2020-05-17 NOTE — Discharge Summary (Signed)
[facility] Discharge Summary   Patient ID:  Holly Stevenson MRN: [id number] DOB/AGE: February 20, 1951 69 y.o.  Admit date: 05/10/2020 Discharge date: 05/13/2020 Date of Surgery: 05/10/2020 Surgeon: Surgeon(s): Leonie Douglas, MD Cephus Shelling, MD  Admission Diagnosis: PAD (peripheral artery disease) [facility]) [I73.9]  Discharge Diagnoses:  PAD (peripheral artery disease) (HCC) [I73.9]  Secondary Diagnoses: Past Medical History:  Diagnosis Date   Diabetes mellitus without complication (HCC)    High cholesterol    Hypertension    Pneumonia     Procedure(s): RIGHT TO LEFT FEMORAL-FEMORAL ARTERY BYPASS GRAFT LEFT FEMORAL-POPLITEAL ARTERY BYPASS GRAFT PATCH ANGIOPLASTY USING XENOSURE PATCH ENDARTERECTOMY RIGHT FEMORAL LEFT LOWER EXTREMITY ANGIOGRAM  Discharged Condition: stable  HPI: 69 y/o female that presents to the hospital with left knee pain and left 4th toe nail plate non healing wound with dry gangrene.  She states her PCP removed the right 4th toe nail a month ago.  She denise history of claudication symptom, rest pain or previous non healing.   ABI's were dampened without toe pressures.    Dr. Lenell Antu performed an angiogram: Operative findings: 1. Occlusion left common iliac artery with reconstitution of the external iliac artery via collaterals  Plan for surgerybelow.   Hospital Course:  Holly Stevenson is a 69 y.o. female is S/P  Procedure(s): RIGHT TO LEFT FEMORAL-FEMORAL ARTERY BYPASS GRAFT LEFT FEMORAL-POPLITEAL ARTERY BYPASS GRAFT PATCH ANGIOPLASTY USING XENOSURE PATCH ENDARTERECTOMY RIGHT FEMORAL LEFT LOWER EXTREMITY ANGIOGRAM  She regained brisk doppler signals DP/PT BLE.  Groin incisions soft without hematomas.  Pain controlled wit PO medications. Palpable fem-fem bypass.  She was discharged home 05/13/2020 in stable condition with patent bypass and all incisions are well healing.    She will take 81 mg asa daily.     Significant Diagnostic Studies: CBC Lab Results  Component Value Date   WBC 11.9 (H) 05/11/2020   HGB 10.2 (L) 05/11/2020   HCT 31.2 (L) 05/11/2020   MCV 93.4 05/11/2020   PLT 159 05/11/2020    BMET    Component Value Date/Time   NA 136 05/11/2020 0406   K 4.4 05/11/2020 0406   CL 101 05/11/2020 0406   CO2 21 (L) 05/11/2020 0406   GLUCOSE 184 (H) 05/11/2020 0406   BUN 18 05/11/2020 0406   CREATININE 0.79 05/11/2020 0406   CALCIUM 8.4 (L) 05/11/2020 0406   GFRNONAA >60 05/11/2020 0406   GFRAA 52 (L) 02/18/2018 1610   COAG Lab Results  Component Value Date   INR 1.0 05/03/2020   INR 1.0 04/25/2020     Disposition:  Discharge to :Home Discharge Instructions    Call MD for:  redness, tenderness, or signs of infection (pain, swelling, bleeding, redness, odor or green/yellow discharge around incision site)   Complete by: As directed    Call MD for:  severe or increased pain, loss or decreased feeling  in affected limb(s)   Complete by: As directed    Call MD for:  temperature >100.5   Complete by: As directed    Discharge instructions   Complete by: As directed    Gradually increase your activity, may shower daily.  Keep groin incisions clean and dry in between showers.   Resume previous diet   Complete by: As directed      Allergies as of 05/13/2020   No Known Allergies     Medication List    TAKE these medications   acetaminophen 500 MG tablet Commonly known as: TYLENOL Take 1,500  mg by mouth 3 (three) times daily as needed for moderate pain or headache.   aspirin 81 MG EC tablet Take 1 tablet (81 mg total) by mouth daily at 6 (six) AM. Swallow whole.   atorvastatin 40 MG tablet Commonly known as: LIPITOR Take 1 tablet (40 mg total) by mouth daily. What changed:   medication strength  how much to take   benazepril 10 MG tablet Commonly known as: LOTENSIN Take 10 mg by mouth daily.   cholecalciferol 25 MCG (1000 UNIT) tablet Commonly known  as: VITAMIN D3 Take 1,000 Units by mouth daily.   feeding supplement (GLUCERNA SHAKE) Liqd Take 237 mLs by mouth 3 (three) times daily between meals.   Jardiance 25 MG Tabs tablet Generic drug: empagliflozin Take 25 mg by mouth daily.   metFORMIN 500 MG tablet Commonly known as: GLUCOPHAGE Take 1,000 mg by mouth 2 (two) times daily.   multivitamin with minerals tablet Take 1 tablet by mouth daily.   oxyCODONE 5 MG immediate release tablet Commonly known as: Oxy IR/ROXICODONE Take 1 tablet (5 mg total) by mouth every 6 (six) hours as needed for moderate pain. What changed:   how much to take  when to take this      Verbal and written Discharge instructions given to the patient. Wound care per Discharge AVS  Follow-up Information    Leonie Douglas, MD Follow up in 3 week(s).   Specialties: Vascular Surgery, Interventional Cardiology Why: sent Contact information: [address] [place] [address] [phone]               Signed: Mosetta Pigeon 05/17/2020, 8:31 AM - For VQI Registry use --- Instructions: Press F2 to tab through selections.  Delete question if not applicable.   Post-op:  Wound infection: No  Graft infection: No  Transfusion: No  If yes, 0 units given New Arrhythmia: No Ipsilateral amputation: [ x] no, [ ]  Minor, [ ]  BKA, [ ]  AKA Discharge patency: [x ] Primary, [ ]  Primary assisted, [ ]  Secondary, [ ]  Occluded Patency judged by: [x ] Dopper only, [ ]  Palpable graft pulse, [ ]  Palpable distal pulse, [ ]  ABI inc. > 0.15, [ ]  Duplex  D/C Ambulatory Status: Ambulatory  Complications: MI: [ x] No, [ ]  Troponin only, [ ]  EKG or Clinical CHF: No Resp failure: [x ] none, [ ]  Pneumonia, [ ]  Ventilator Chg in renal function: [x ] none, [ ]  Inc. Cr > 0.5, [ ]  Temp. Dialysis, [ ]  Permanent dialysis Stroke: [x ] None, [ ]  Minor, [ ]  Major Return to OR: No  Reason for return to OR: [ ]  Bleeding, [ ]  Infection, [ ]  Thrombosis, [ ]   Revision  Discharge medications: Statin use:  Yes ASA use:  Yes Plavix use:  No  for medical reason not indicated Beta blocker use: No  for medical reason not indicated Coumadin use: No  for medical reason not indicated

## 2020-05-20 ENCOUNTER — Other Ambulatory Visit (HOSPITAL_COMMUNITY)
Admission: RE | Admit: 2020-05-20 | Discharge: 2020-05-20 | Disposition: A | Discharge status: Home / Self Care | Payer: BC Managed Care – PPO | Source: Ambulatory Visit | Attending: Emergency Medicine | Admitting: Emergency Medicine

## 2020-05-20 DIAGNOSIS — I129 Hypertensive chronic kidney disease with stage 1 through stage 4 chronic kidney disease, or unspecified chronic kidney disease: Secondary | ICD-10-CM | POA: Diagnosis not present

## 2020-05-20 DIAGNOSIS — R846 Abnormal cytological findings in specimens from respiratory organs and thorax: Secondary | ICD-10-CM | POA: Diagnosis not present

## 2020-05-20 DIAGNOSIS — Z01812 Encounter for preprocedural laboratory examination: Secondary | ICD-10-CM | POA: Insufficient documentation

## 2020-05-20 DIAGNOSIS — E1122 Type 2 diabetes mellitus with diabetic chronic kidney disease: Secondary | ICD-10-CM | POA: Diagnosis not present

## 2020-05-20 DIAGNOSIS — Z20822 Contact with and (suspected) exposure to covid-19: Secondary | ICD-10-CM | POA: Diagnosis not present

## 2020-05-20 DIAGNOSIS — R918 Other nonspecific abnormal finding of lung field: Secondary | ICD-10-CM | POA: Diagnosis not present

## 2020-05-20 DIAGNOSIS — N183 Chronic kidney disease, stage 3 unspecified: Secondary | ICD-10-CM | POA: Diagnosis not present

## 2020-05-20 DIAGNOSIS — F17200 Nicotine dependence, unspecified, uncomplicated: Secondary | ICD-10-CM | POA: Diagnosis not present

## 2020-05-20 LAB — SARS CORONAVIRUS 2 (TAT 6-24 HRS): SARS Coronavirus 2: NEGATIVE

## 2020-05-22 ENCOUNTER — Encounter (HOSPITAL_COMMUNITY): Payer: Self-pay | Admitting: Emergency Medicine

## 2020-05-22 NOTE — Progress Notes (Signed)
PCP:  Maurice Small, MD Cardiologist:  Denies  EKG:  04/25/20 CXR:  N/A ECHO:  04/25/20. Was done for circulation to foot issues Stress Test:  Denies Cardiac Cath:  Denies  Fasting Blood Sugar-  130-200 Checks Blood Sugar_0__ times a day.  Patient has run out of testing strips so does not currently check BG.  ASA Instructions:  Continue ASA per patient Blood Thinners:  No  OSA/CPAP:  No  Covid test 05/20/20 negative  Anesthesia Review:  No  Patient denies shortness of breath, fever, cough, and chest pain at PAT appointment.  Patient verbalized understanding of instructions provided today at the PAT appointment.  Patient asked to review instructions at home and day of surgery.

## 2020-05-23 ENCOUNTER — Ambulatory Visit (HOSPITAL_COMMUNITY): Payer: BC Managed Care – PPO | Admitting: Anesthesiology

## 2020-05-23 ENCOUNTER — Ambulatory Visit (HOSPITAL_COMMUNITY): Payer: BC Managed Care – PPO

## 2020-05-23 ENCOUNTER — Other Ambulatory Visit: Payer: Self-pay

## 2020-05-23 ENCOUNTER — Encounter (HOSPITAL_COMMUNITY)
Admission: RE | Disposition: A | Discharge status: Home / Self Care | Payer: Self-pay | Source: Home / Self Care | Attending: Emergency Medicine

## 2020-05-23 ENCOUNTER — Encounter (HOSPITAL_COMMUNITY): Payer: Self-pay | Admitting: Emergency Medicine

## 2020-05-23 ENCOUNTER — Ambulatory Visit (HOSPITAL_COMMUNITY)
Admission: RE | Admit: 2020-05-23 | Discharge: 2020-05-23 | Disposition: A | Discharge status: Home / Self Care | Payer: BC Managed Care – PPO | Attending: Emergency Medicine | Admitting: Emergency Medicine

## 2020-05-23 DIAGNOSIS — Z9889 Other specified postprocedural states: Secondary | ICD-10-CM

## 2020-05-23 DIAGNOSIS — R846 Abnormal cytological findings in specimens from respiratory organs and thorax: Secondary | ICD-10-CM | POA: Insufficient documentation

## 2020-05-23 DIAGNOSIS — Z20822 Contact with and (suspected) exposure to covid-19: Secondary | ICD-10-CM | POA: Insufficient documentation

## 2020-05-23 DIAGNOSIS — F17200 Nicotine dependence, unspecified, uncomplicated: Secondary | ICD-10-CM | POA: Insufficient documentation

## 2020-05-23 DIAGNOSIS — J984 Other disorders of lung: Secondary | ICD-10-CM

## 2020-05-23 DIAGNOSIS — R918 Other nonspecific abnormal finding of lung field: Secondary | ICD-10-CM

## 2020-05-23 DIAGNOSIS — N183 Chronic kidney disease, stage 3 unspecified: Secondary | ICD-10-CM | POA: Insufficient documentation

## 2020-05-23 DIAGNOSIS — I129 Hypertensive chronic kidney disease with stage 1 through stage 4 chronic kidney disease, or unspecified chronic kidney disease: Secondary | ICD-10-CM | POA: Insufficient documentation

## 2020-05-23 DIAGNOSIS — E1122 Type 2 diabetes mellitus with diabetic chronic kidney disease: Secondary | ICD-10-CM | POA: Insufficient documentation

## 2020-05-23 HISTORY — PX: ELECTROMAGNETIC NAVIGATION BROCHOSCOPY: SHX5369

## 2020-05-23 HISTORY — PX: BRONCHIAL BIOPSY: SHX5109

## 2020-05-23 HISTORY — PX: BRONCHIAL NEEDLE ASPIRATION BIOPSY: SHX5106

## 2020-05-23 HISTORY — PX: BRONCHIAL BRUSHINGS: SHX5108

## 2020-05-23 HISTORY — PX: VIDEO BRONCHOSCOPY: SHX5072

## 2020-05-23 HISTORY — PX: BRONCHIAL WASHINGS: SHX5105

## 2020-05-23 LAB — GLUCOSE, CAPILLARY
Glucose-Capillary: 145 mg/dL — ABNORMAL HIGH (ref 70–99)
Glucose-Capillary: 137 mg/dL — ABNORMAL HIGH (ref 70–99)

## 2020-05-23 SURGERY — BRONCHOSCOPY, WITH FLUOROSCOPY
Anesthesia: General

## 2020-05-23 MED ORDER — MIDAZOLAM HCL 2 MG/2ML IJ SOLN
INTRAMUSCULAR | Status: DC | PRN
  Administered 2020-05-23: 2 mg via INTRAVENOUS

## 2020-05-23 MED ORDER — CHLORHEXIDINE GLUCONATE 0.12 % MT SOLN
OROMUCOSAL | Status: AC
  Filled 2020-05-23: qty 15

## 2020-05-23 MED ORDER — LIDOCAINE 2% (20 MG/ML) 5 ML SYRINGE
INTRAMUSCULAR | Status: DC | PRN
  Administered 2020-05-23: 60 mg via INTRAVENOUS

## 2020-05-23 MED ORDER — PROPOFOL 10 MG/ML IV BOLUS
INTRAVENOUS | Status: DC | PRN
  Administered 2020-05-23: 140 mg via INTRAVENOUS

## 2020-05-23 MED ORDER — FENTANYL CITRATE (PF) 100 MCG/2ML IJ SOLN: INTRAMUSCULAR | Status: DC | PRN

## 2020-05-23 MED ORDER — HYDROMORPHONE HCL 1 MG/ML IJ SOLN: 0.2500 mg | INTRAMUSCULAR | Status: DC | PRN

## 2020-05-23 MED ORDER — DEXAMETHASONE SODIUM PHOSPHATE 10 MG/ML IJ SOLN
INTRAMUSCULAR | Status: DC | PRN
  Administered 2020-05-23: 5 mg via INTRAVENOUS
  Administered 2020-05-23: 60 mg via INTRAVENOUS

## 2020-05-23 MED ORDER — ONDANSETRON HCL 4 MG/2ML IJ SOLN: 4.0000 mg | Freq: Once | INTRAMUSCULAR | Status: DC | PRN

## 2020-05-23 MED ORDER — ACETAMINOPHEN 10 MG/ML IV SOLN
1000.0000 mg | Freq: Once | INTRAVENOUS | Status: DC | PRN
  Filled 2020-05-23: qty 100

## 2020-05-23 MED ORDER — ROCURONIUM BROMIDE 10 MG/ML (PF) SYRINGE
PREFILLED_SYRINGE | INTRAVENOUS | Status: DC | PRN
  Administered 2020-05-23: 60 mg via INTRAVENOUS
  Administered 2020-05-23: 30 mg via INTRAVENOUS

## 2020-05-23 MED ORDER — ONDANSETRON HCL 4 MG/2ML IJ SOLN
INTRAMUSCULAR | Status: DC | PRN
  Administered 2020-05-23: 4 mg via INTRAVENOUS

## 2020-05-23 MED ORDER — SUGAMMADEX SODIUM 200 MG/2ML IV SOLN
INTRAVENOUS | Status: DC | PRN
  Administered 2020-05-23: 200 mg via INTRAVENOUS

## 2020-05-23 MED ORDER — FENTANYL CITRATE (PF) 100 MCG/2ML IJ SOLN
INTRAMUSCULAR | Status: DC | PRN
Start: 2020-05-23 — End: 2020-05-23
  Administered 2020-05-23: 50 ug via INTRAVENOUS

## 2020-05-23 MED ORDER — LACTATED RINGERS IV SOLN: INTRAVENOUS | Status: DC

## 2020-05-23 MED ORDER — EPHEDRINE SULFATE-NACL 50-0.9 MG/10ML-% IV SOSY
PREFILLED_SYRINGE | INTRAVENOUS | Status: DC | PRN
  Administered 2020-05-23: 10 mg via INTRAVENOUS
  Administered 2020-05-23: 15 mg via INTRAVENOUS

## 2020-05-23 MED ORDER — CHLORHEXIDINE GLUCONATE 0.12 % MT SOLN
15.0000 mL | Freq: Once | OROMUCOSAL | Status: AC
  Administered 2020-05-23: 15 mL via OROMUCOSAL

## 2020-05-23 NOTE — Anesthesia Procedure Notes (Addendum)
Procedure Name: Intubation Date/Time: 05/23/2020 7:48 AM Performed by: Barrington Ellison, CRNA Pre-anesthesia Checklist: Patient identified, Emergency Drugs available, Suction available and Patient being monitored Patient Re-evaluated:Patient Re-evaluated prior to induction Oxygen Delivery Method: Circle System Utilized Preoxygenation: Pre-oxygenation with 100% oxygen Induction Type: IV induction Ventilation: Mask ventilation without difficulty Laryngoscope Size: Mac and 3 Grade View: Grade I Tube type: Oral Tube size: 8.0 mm Number of attempts: 1 Airway Equipment and Method: Stylet and Oral airway Placement Confirmation: ETT inserted through vocal cords under direct vision,  positive ETCO2 and breath sounds checked- equal and bilateral Secured at: 21 cm Tube secured with: Tape Dental Injury: Teeth and Oropharynx as per pre-operative assessment

## 2020-05-23 NOTE — Transfer of Care (Signed)
Immediate Anesthesia Transfer of Care Note  Patient: Holly Stevenson  Procedure(s) Performed: VIDEO BRONCHOSCOPY WITH FLUORO (N/A ) ELECTROMAGNETIC NAVIGATION BRONCHOSCOPY (N/A ) BRONCHIAL BRUSHINGS BRONCHIAL BIOPSIES BRONCHIAL NEEDLE ASPIRATION BIOPSIES BRONCHIAL WASHINGS  Patient Location: PACU  Anesthesia Type:General  Level of Consciousness: drowsy and patient cooperative  Airway & Oxygen Therapy: Patient Spontanous Breathing  Post-op Assessment: Report given to RN  Post vital signs: Reviewed and stable  Last Vitals:  Vitals Value Taken Time  BP 138/41 05/23/20 0922  Temp    Pulse 67 05/23/20 0923  Resp 29 05/23/20 0923  SpO2 92 % 05/23/20 0923  Vitals shown include unvalidated device data.  Last Pain:  Vitals:   05/23/20 3557  TempSrc:   PainSc: 0-No pain         Complications: No complications documented.

## 2020-05-23 NOTE — Op Note (Signed)
Video Bronchoscopy with Electromagnetic Navigation Procedure Note  Date of Operation: 05/23/2020  Pre-op Diagnosis: Right upper lobe cavitary lesion  Post-op Diagnosis: Same  Surgeon: Baltazar Apo  Assistants: None  Anesthesia: General endotracheal anesthesia  Operation: Flexible video fiberoptic bronchoscopy with electromagnetic navigation and biopsies.  Estimated Blood Loss: Minimal  Complications: None apparent  Indications and History: Holly Stevenson is a 69 y.o. female with history of tobacco.  She was found to have cavitary right upper lobe masslike lesion on CT scan of the chest.  Recommendation was made to achieve tissue diagnosis via navigational bronchoscopy with biopsies.  The risks, benefits, complications, treatment options and expected outcomes were discussed with the patient.  The possibilities of pneumothorax, pneumonia, reaction to medication, pulmonary aspiration, perforation of a viscus, bleeding, failure to diagnose a condition and creating a complication requiring transfusion or operation were discussed with the patient who freely signed the consent.    Description of Procedure: The patient was seen in the Preoperative Area, was examined and was deemed appropriate to proceed.  The patient was taken to [facility] endoscopy room 2, identified as Holly Stevenson and the procedure verified as Flexible Video Fiberoptic Bronchoscopy.  A Time Out was held and the above information confirmed.   Prior to the date of the procedure a high-resolution CT scan of the chest was performed. Utilizing [place] a virtual tracheobronchial tree was generated to allow the creation of distinct navigation pathways to the patient's parenchymal abnormalities. After being taken to the operating room general anesthesia was initiated and the patient  was orally intubated. The video fiberoptic bronchoscope was introduced via the endotracheal tube and a general inspection  was performed which showed normal airways throughout. The extendable working channel and locator guide were introduced into the bronchoscope. The distinct navigation pathways prepared prior to this procedure were then utilized to navigate to within 0.5 cm of patient's lesion identified on CT scan. The extendable working channel was secured into place and the locator guide was withdrawn. Under fluoroscopic guidance transbronchial needle brushings, transbronchial Wang needle biopsies, and transbronchial forceps biopsies were performed to be sent for cytology and pathology. A bronchioalveolar lavage was performed in the right upper lobe and sent for cytology and microbiology (bacterial, fungal, AFB smears and cultures). At the end of the procedure a general airway inspection was performed and there was no evidence of active bleeding. The bronchoscope was removed.  The patient tolerated the procedure well. There was no significant blood loss and there were no obvious complications. A post-procedural chest x-ray is pending.  Samples: 1. Transbronchial needle brushings from right upper lobe cavitary lesion 2. Transbronchial Wang needle biopsies from right upper lobe cavitary lesion 3. Transbronchial forceps biopsies from right upper lobe cavitary lesion 4. Bronchoalveolar lavage from right upper lobe  Plans:  The patient will be discharged from the PACU to home when recovered from anesthesia and after chest x-ray is reviewed. We will review the cytology, pathology and microbiology results with the patient when they become available. Outpatient followup will be with Dr. Lamonte Sakai.   Baltazar Apo, MD, PhD 05/23/2020, 9:16 AM [facility] [phone] or if no answer [phone]

## 2020-05-23 NOTE — Anesthesia Postprocedure Evaluation (Signed)
Anesthesia Post Note  Patient: Holly Stevenson  Procedure(s) Performed: VIDEO BRONCHOSCOPY WITH FLUORO (N/A ) ELECTROMAGNETIC NAVIGATION BRONCHOSCOPY (N/A ) BRONCHIAL BRUSHINGS BRONCHIAL BIOPSIES BRONCHIAL NEEDLE ASPIRATION BIOPSIES BRONCHIAL WASHINGS     Patient location during evaluation: Endoscopy Anesthesia Type: General Level of consciousness: awake and alert Pain management: pain level controlled Vital Signs Assessment: post-procedure vital signs reviewed and stable Respiratory status: spontaneous breathing, nonlabored ventilation, respiratory function stable and patient connected to nasal cannula oxygen Cardiovascular status: blood pressure returned to baseline and stable Postop Assessment: no apparent nausea or vomiting Anesthetic complications: no   No complications documented.  Last Vitals:  Vitals:   05/23/20 1015 05/23/20 1030  BP: (!) 163/51 (!) 154/59  Pulse: 61 60  Resp: (!) 22 18  Temp:  36.7 C  SpO2: 96% 96%    Last Pain:  Vitals:   05/23/20 1015  TempSrc:   PainSc: 0-No pain   Pain Goal:                   Nelle Don Aphrodite Harpenau

## 2020-05-23 NOTE — Anesthesia Preprocedure Evaluation (Signed)
Anesthesia Evaluation  Patient identified by MRN, date of birth, ID band Patient awake    Reviewed: Patient's Chart, lab work & pertinent test results  Airway Mallampati: II  TM Distance: >3 FB Neck ROM: Full    Dental  (+) Teeth Intact   Pulmonary pneumonia, resolved, Current Smoker and Patient abstained from smoking.,  RUL: nodule    + decreased breath sounds      Cardiovascular hypertension, Pt. on medications  Rhythm:Regular Rate:Normal     Neuro/Psych negative neurological ROS  negative psych ROS   GI/Hepatic negative GI ROS, Neg liver ROS,   Endo/Other  diabetes, Well Controlled, Type 2, Oral Hypoglycemic Agents  Renal/GU   negative genitourinary   Musculoskeletal  (+) Arthritis , Osteoarthritis,    Abdominal (+)  Abdomen: soft. Bowel sounds: normal.  Peds  Hematology negative hematology ROS (+)   Anesthesia Other Findings   Reproductive/Obstetrics                             Anesthesia Physical Anesthesia Plan  ASA: III  Anesthesia Plan: General   Post-op Pain Management:    Induction: Intravenous  PONV Risk Score and Plan: 2 and Ondansetron, Propofol infusion and Treatment may vary due to age or medical condition  Airway Management Planned: Mask and Oral ETT  Additional Equipment: None  Intra-op Plan:   Post-operative Plan: Extubation in OR  Informed Consent: I have reviewed the patients History and Physical, chart, labs and discussed the procedure including the risks, benefits and alternatives for the proposed anesthesia with the patient or authorized representative who has indicated his/her understanding and acceptance.     Dental advisory given  Plan Discussed with: CRNA  Anesthesia Plan Comments: (Lab Results      Component                Value               Date                      WBC                      11.9 (H)            05/11/2020                 HGB                      10.2 (L)            05/11/2020                HCT                      31.2 (L)            05/11/2020                MCV                      93.4                05/11/2020                PLT                      159  05/11/2020          )        Anesthesia Quick Evaluation

## 2020-05-23 NOTE — Interval H&P Note (Signed)
History and Physical Interval Note:  05/23/2020 7:27 AM  Holly Stevenson  has presented today for surgery, with the diagnosis of RUL nodule.  The various methods of treatment have been discussed with the patient and family. After consideration of risks, benefits and other options for treatment, the patient has consented to  Procedure(s): VIDEO BRONCHOSCOPY WITH FLUORO (N/A) ELECTROMAGNETIC NAVIGATION BRONCHOSCOPY (N/A) as a surgical intervention.  The patient's history has been reviewed, patient examined, no change in status, stable for surgery.  I have reviewed the patient's chart and labs.  Questions were answered to the patient's satisfaction.     Leslye Peer

## 2020-05-23 NOTE — Discharge Instructions (Signed)
Flexible Bronchoscopy, Care After This sheet gives you information about how to care for yourself after your test. Your doctor may also give you more specific instructions. If you have problems or questions, contact your doctor. Follow these instructions at home: Eating and drinking  Do not eat or drink anything (not even water) for 2 hours after your test, or until your numbing medicine (local anesthetic) wears off.  When your numbness is gone and your cough and gag reflexes have come back, you may: ? Eat only soft foods. ? Slowly drink liquids.  The day after the test, go back to your normal diet. Driving  Do not drive for 24 hours if you were given a medicine to help you relax (sedative).  Do not drive or use heavy machinery while taking prescription pain medicine. General instructions   Take over-the-counter and prescription medicines only as told by your doctor.  Return to your normal activities as told. Ask what activities are safe for you.  Do not use any products that have nicotine or tobacco in them. This includes cigarettes and e-cigarettes. If you need help quitting, ask your doctor.  Keep all follow-up visits as told by your doctor. This is important. It is very important if you had a tissue sample (biopsy) taken. Get help right away if:  You have shortness of breath that gets worse.  You get light-headed.  You feel like you are going to pass out (faint).  You have chest pain.  You cough up: ? More than a little blood. ? More blood than before. Summary  Do not eat or drink anything (not even water) for 2 hours after your test, or until your numbing medicine wears off.  Do not use cigarettes. Do not use e-cigarettes.  Get help right away if you have chest pain.  Please call our office for any questions or concerns. [phone].   This information is not intended to replace advice given to you by your health care provider. Make sure you discuss any  questions you have with your health care provider. Document Revised: 05/16/2017 Document Reviewed: 06/21/2016 Elsevier Patient Education  2020 [employer].

## 2020-05-24 ENCOUNTER — Encounter (HOSPITAL_COMMUNITY): Payer: Self-pay | Admitting: Emergency Medicine

## 2020-05-25 ENCOUNTER — Telehealth: Payer: Self-pay | Admitting: Internal Medicine

## 2020-05-25 ENCOUNTER — Telehealth: Payer: Self-pay

## 2020-05-25 DIAGNOSIS — R918 Other nonspecific abnormal finding of lung field: Secondary | ICD-10-CM

## 2020-05-25 LAB — CULTURE, RESPIRATORY W GRAM STAIN: Culture: NO GROWTH

## 2020-05-25 LAB — FUNGUS STAIN

## 2020-05-25 LAB — FUNGAL STAIN REFLEX

## 2020-05-25 LAB — CYTOLOGY - NON PAP

## 2020-05-25 NOTE — Telephone Encounter (Signed)
Called and spoke with patient, advised that Dr. Delton Coombes is fine seeing her Monday 12/13 at 2 pm after having her PFT on 12/10 at 11 am.  He will see her PET scan later and discuss with her at a later time.  Nothing further needed.

## 2020-05-25 NOTE — Telephone Encounter (Signed)
If we can get the PFT on 12/10, then lets keep the OV on 12/13. I can see the PET after

## 2020-05-25 NOTE — Telephone Encounter (Signed)
Patient called at 1625 with bleeding from her groin incision. She is s/p fem endarterectomy from 11/24. She says she felt wetness and some pain. Noticed bleeding from incision site. Was healing well, and she is not aware of anything she did that would cause incision to open. Patient says it hurts a little, not bleeding profusely. Advised patient to hold pressure with a gauze on the area for 10 minutes and if the bleeding had stopped, cover with dry gauze and secure with tape. She said the bleeding had slowed while we talked. Instructed patient that if bleeding did not stop or became worse or if she started to develop a hematoma - to go to the hospital. Patient verbalized understanding.

## 2020-05-25 NOTE — Telephone Encounter (Signed)
Spoke with the pt  I cancelled her visit on 05/29/20  There is no PFT availability except for 05/26/20  I put in in for then  PET still not scheduled yet  Dr Delton Coombes- are you going to be okay with is overbooking to see her soon?  Your blocked spots have been used  Please advise thanks

## 2020-05-25 NOTE — Telephone Encounter (Signed)
Discussed cytology results with patient.  Brushings show atypical cells, cell block with reactive changes, no malignancy.  In total the results are not definitively diagnostic for lung cancer. Discussed options with her.  Based on the atypical cells, the appearance on CT chest I remain highly suspicious that this is primary lung cancer.  We will perform PFT, PET scan to see if she may be a candidate for primary resection.  If so we will discuss referral to thoracic surgery.  Other options include repeat bronchoscopy, referral to oncology and radiation oncology to consider treatment based on current information.  I will discuss PFT, PET scan, next steps with the patient in office.  Need to cancel her current appointment which is scheduled for 05/29/2020. Try to get her an appt to have PFT and OV w me o same day, asap. Thanks.

## 2020-05-26 ENCOUNTER — Ambulatory Visit (INDEPENDENT_AMBULATORY_CARE_PROVIDER_SITE_OTHER): Payer: Self-pay | Admitting: Physician Assistant

## 2020-05-26 ENCOUNTER — Ambulatory Visit (INDEPENDENT_AMBULATORY_CARE_PROVIDER_SITE_OTHER): Payer: BC Managed Care – PPO | Admitting: Emergency Medicine

## 2020-05-26 ENCOUNTER — Telehealth: Payer: Self-pay

## 2020-05-26 ENCOUNTER — Other Ambulatory Visit: Payer: Self-pay

## 2020-05-26 VITALS — BP 163/41 | HR 66 | Temp 98.4°F | Resp 20 | Ht 68.0 in | Wt 185.4 lb

## 2020-05-26 DIAGNOSIS — R918 Other nonspecific abnormal finding of lung field: Secondary | ICD-10-CM | POA: Diagnosis not present

## 2020-05-26 DIAGNOSIS — I739 Peripheral vascular disease, unspecified: Secondary | ICD-10-CM

## 2020-05-26 LAB — PULMONARY FUNCTION TEST
DL/VA % pred: 76 %
DL/VA: 3.08 ml/min/mmHg/L
DLCO cor % pred: 58 %
DLCO cor: 12.94 ml/min/mmHg
DLCO unc % pred: 58 %
DLCO unc: 12.94 ml/min/mmHg
FEF 25-75 Post: 0.6 L/sec
FEF 25-75 Pre: 0.54 L/sec
FEF2575-%Change-Post: 10 %
FEF2575-%Pred-Post: 28 %
FEF2575-%Pred-Pre: 25 %
FEV1-%Change-Post: 7 %
FEV1-%Pred-Post: 49 %
FEV1-%Pred-Pre: 45 %
FEV1-Post: 1.3 L
FEV1-Pre: 1.22 L
FEV1FVC-%Change-Post: 3 %
FEV1FVC-%Pred-Pre: 68 %
FEV6-%Change-Post: 1 %
FEV6-%Pred-Post: 70 %
FEV6-%Pred-Pre: 69 %
FEV6-Post: 2.38 L
FEV6-Pre: 2.34 L
FEV6FVC-%Change-Post: 0 %
FEV6FVC-%Pred-Post: 104 %
FEV6FVC-%Pred-Pre: 103 %
FVC-%Change-Post: 3 %
FVC-%Pred-Post: 69 %
FVC-%Pred-Pre: 67 %
FVC-Post: 2.43 L
FVC-Pre: 2.35 L
Post FEV1/FVC ratio: 54 %
Post FEV6/FVC ratio: 100 %
Pre FEV1/FVC ratio: 52 %
Pre FEV6/FVC Ratio: 100 %
RV % pred: 137 %
RV: 3.28 L
TLC % pred: 100 %
TLC: 5.69 L

## 2020-05-26 NOTE — Progress Notes (Signed)
Full PFT performed today. °

## 2020-05-26 NOTE — Progress Notes (Signed)
POST OPERATIVE OFFICE NOTE    CC:  F/u for surgery  HPI:  This is a 69 y.o. female who present with left LE pain and non healing 4th toe s/p nail removal.   She under went angiogram with runoff that revealed Occlusion left common iliac artery with reconstitution of the external iliac artery via collaterals, Bilateral flush superficial femoral artery occlusions, Bilateral reconstitution of the above-knee popliteal artery with three-vessel runoff, right foot dominated by the posterior tibial artery.    Dr. Lenell Antu then performed surgery for left critical limb ischemia:  PROCEDURE:   1) right iliofemoral endarterectomy and bovine pericardial patch angioplasty 2) right to left femoral-femoral bypass grafting with 6 mm externally supported PTFE 3) left femoral to above-knee popliteal artery bypass grafting with 6 mm externally supported PTFE 4) bilateral lower extremity angiography (35cc contrast)  Pt returns today for right groin check.  She states a few days ago she noticed some bloody drainage coming from the right groin.  She has kept dry guaze over it until she came in today.  She does have a slight increase in pain in the right groin area.  She is also concerned about the swelling in B feet.  She denise fever and chills.   She sleeps in a recliner and does not go out of her house much.  No Known Allergies  Current Outpatient Medications  Medication Sig Dispense Refill  . acetaminophen (TYLENOL) 500 MG tablet Take 1,500 mg by mouth 3 (three) times daily as needed for moderate pain or headache.    Marland Kitchen aspirin EC 81 MG EC tablet Take 1 tablet (81 mg total) by mouth daily at 6 (six) AM. Swallow whole. 30 tablet 11  . atorvastatin (LIPITOR) 40 MG tablet Take 1 tablet (40 mg total) by mouth daily.    . benazepril (LOTENSIN) 10 MG tablet Take 10 mg by mouth daily.    . cholecalciferol (VITAMIN D3) 25 MCG (1000 UNIT) tablet Take 1,000 Units by mouth daily.    . feeding supplement, GLUCERNA SHAKE,  (GLUCERNA SHAKE) LIQD Take 237 mLs by mouth 3 (three) times daily between meals. 237 mL 0  . JARDIANCE 25 MG TABS tablet Take 25 mg by mouth daily.    . metFORMIN (GLUCOPHAGE) 500 MG tablet Take 1,000 mg by mouth 2 (two) times daily.     . Multiple Vitamins-Minerals (MULTIVITAMIN WITH MINERALS) tablet Take 1 tablet by mouth daily.    Marland Kitchen oxyCODONE (OXY IR/ROXICODONE) 5 MG immediate release tablet Take 1 tablet (5 mg total) by mouth every 6 (six) hours as needed for moderate pain. 30 tablet 0   No current facility-administered medications for this visit.     ROS:  See HPI  Physical Exam:  Vitals:   05/26/20 1346  BP: (!) 163/41  Pulse: 66  Resp: 20  Temp: 98.4 F (36.9 C)  SpO2: 99%     Incision:  Right groin without hematoma, no active drainage with compression surround the incision.  Minimal SS expression from the lateral corner.  No frank incisional dehiscence.      Extremities:  Brisk doppler signals B LE DP/PT/Peroneal  Left 4th toe nail bed dry with brisk cap refill on toe pad  Left groin with intact steri strips and no drainage or hematoma.   Assessment/Plan:  This is a 69 y.o. female who is s/p: PROCEDURE:  11/24/21by Dr. Blase Mess 1) right iliofemoral endarterectomy and bovine pericardial patch angioplasty 2) right to left femoral-femoral bypass grafting with 6 mm  externally supported PTFE 3) left femoral to above-knee popliteal artery bypass grafting with 6 mm externally supported PTFE 4) bilateral lower extremity angiography (35cc contrast)   Her bypass in open with brisk doppler signals, motor and distal sensation intact.  She has superficial separation of the right groin incision with skin edge maceration.  No active bleeding was noted.  No evidence of hematoma, no erythema.    I will get her to return next week for a wound check.  She will keep dry guaze over the right groin and change it frequently.  I gave her a handout to demonstrate proper elevation of her LE  to help with edema.  She will try and increase her activity of walking as she tolerates.  Shower daily with soap and water, dry groins well and cover with dry guaze.   Cont. Lipitor and ASA daily.  Mosetta Pigeon PA-C [facility] [phone]  Clinic MD:  Randie Heinz

## 2020-05-26 NOTE — Telephone Encounter (Signed)
Opened in error - addendum to telephone encounter charted.

## 2020-05-26 NOTE — Telephone Encounter (Signed)
Patient called back this morning - incision site from 11/24 surgery is still bleeding - not profusely, but whenever she goes to the bathroom, walks around etc, she notices blood on gauze. Incision site is red and very painful. Says there may be some swelling around incision. Denies odor or fever. Put on PA schedule for wound check today at 13:35.

## 2020-05-29 ENCOUNTER — Ambulatory Visit: Payer: BC Managed Care – PPO | Admitting: Emergency Medicine

## 2020-05-29 ENCOUNTER — Ambulatory Visit: Payer: BC Managed Care – PPO

## 2020-05-29 ENCOUNTER — Inpatient Hospital Stay: Payer: BC Managed Care – PPO | Admitting: Emergency Medicine

## 2020-05-29 ENCOUNTER — Encounter: Payer: Self-pay | Admitting: Emergency Medicine

## 2020-05-29 ENCOUNTER — Other Ambulatory Visit: Payer: Self-pay

## 2020-05-29 VITALS — BP 142/70 | HR 75 | Temp 97.6°F | Ht 68.0 in | Wt 186.6 lb

## 2020-05-29 DIAGNOSIS — J449 Chronic obstructive pulmonary disease, unspecified: Secondary | ICD-10-CM | POA: Diagnosis not present

## 2020-05-29 DIAGNOSIS — J984 Other disorders of lung: Secondary | ICD-10-CM | POA: Diagnosis not present

## 2020-05-29 MED ORDER — SPIRIVA RESPIMAT 1.25 MCG/ACT IN AERS: 2.0000 | INHALATION_SPRAY | Freq: Every day | RESPIRATORY_TRACT | 0 refills | Status: DC

## 2020-05-29 NOTE — Addendum Note (Signed)
Addended by: Dorisann Frames R on: 05/29/2020 04:04 PM   Modules accepted: Orders

## 2020-05-29 NOTE — Progress Notes (Signed)
Subjective:    Patient ID: Holly Stevenson, female    DOB: 02/24/1951, 69 y.o.   MRN: [id number]  HPI Hospital follow-up visit for 69 year old smoker with diabetes, hypertension, hyperlipidemia, severe peripheral vascular disease.  I met her when she was hospitalized for her PVD.  A CT scan of her chest at that time revealed a right upper lobe cavitary lesion, QuantiFERON gold negative.  We performed navigational bronchoscopy on 05/23/2020.  Her cytology and pathology showed atypical cells but was not fully diagnostic for malignancy.  She has exertional SOB after walking 156f. No real cough or wheeze.   PFT 05/26/20 12/10 reviewed by me show severe obstruction, no BD response. FEV1 1.22 - 1.30 L, decreased diffusion that [place]    Review of Systems As per HPI Past Medical History:  Diagnosis Date  . Diabetes mellitus without complication ([place]   . High cholesterol   . Hypertension   . Pneumonia      Family History  Problem Relation Age of Onset  . Peripheral Artery Disease Neg Hx      Social History   Socioeconomic History  . Marital status: Widowed    Spouse name: Not on file  . Number of children: Not on file  . Years of education: Not on file  . Highest education level: Not on file  Occupational History  . Not on file  Tobacco Use  . Smoking status: Heavy Tobacco Smoker    Packs/day: 1.50    Years: 50.00    Pack years: 75.00    Types: Cigarettes  . Smokeless tobacco: Never Used  . Tobacco comment: 3 cigarettes daily 05/29/20 ARJ   Vaping Use  . Vaping Use: Never used  Substance and Sexual Activity  . Alcohol use: Not Currently  . Drug use: Never  . Sexual activity: Not Currently  Other Topics Concern  . Not on file  Social History Narrative  . Not on file   Social Determinants of Health   Financial Resource Strain: Not on file  Food Insecurity: Not on file  Transportation Needs: Not on file  Physical Activity: Not on file   Stress: Not on file  Social Connections: Not on file  Intimate Partner Violence: Not on file     No Known Allergies   Outpatient Medications Prior to Visit  Medication Sig Dispense Refill  . acetaminophen (TYLENOL) 500 MG tablet Take 1,500 mg by mouth 3 (three) times daily as needed for moderate pain or headache.    .Marland Kitchenaspirin EC 81 MG EC tablet Take 1 tablet (81 mg total) by mouth daily at 6 (six) AM. Swallow whole. 30 tablet 11  . atorvastatin (LIPITOR) 40 MG tablet Take 1 tablet (40 mg total) by mouth daily.    . benazepril (LOTENSIN) 10 MG tablet Take 10 mg by mouth daily.    . cholecalciferol (VITAMIN D3) 25 MCG (1000 UNIT) tablet Take 1,000 Units by mouth daily.    . feeding supplement, GLUCERNA SHAKE, (GLUCERNA SHAKE) LIQD Take 237 mLs by mouth 3 (three) times daily between meals. 237 mL 0  . JARDIANCE 25 MG TABS tablet Take 25 mg by mouth daily.    . metFORMIN (GLUCOPHAGE) 500 MG tablet Take 1,000 mg by mouth 2 (two) times daily.     . Multiple Vitamins-Minerals (MULTIVITAMIN WITH MINERALS) tablet Take 1 tablet by mouth daily.    .Marland KitchenoxyCODONE (OXY IR/ROXICODONE) 5 MG immediate release tablet Take 1 tablet (5 mg total) by  mouth every 6 (six) hours as needed for moderate pain. 30 tablet 0   No facility-administered medications prior to visit.         Objective:   Physical Exam Vitals:   05/29/20 1410  BP: (!) 142/70  Pulse: 75  Temp: 97.6 F (36.4 C)  SpO2: 99%  Weight: 186 lb 9.6 oz (84.6 kg)  Height: _0  (1.727 m)   Gen: Pleasant, well-nourished, in no distress,  normal affect  ENT: No lesions,  mouth clear,  oropharynx clear, no postnasal drip  Neck: No JVD, no stridor  Lungs: No use of accessory muscles, no crackles or wheezing on normal respiration, no wheeze on forced expiration  Cardiovascular: RRR, heart sounds normal, no murmur or gallops, no peripheral edema  Musculoskeletal: No deformities, no cyanosis or clubbing  Neuro: alert, awake, non  focal  Skin: Warm, no lesions or rash    Assessment & Plan:  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  Check pulmonary function testing to see if she might be a candidate for primary resection.  Her FEV1 is 1.22 L, which is concerning.  I do not think she would do well with a right upper lobe lobectomy.  She also has a diffusion defect.  Discussed options with her.  I think the best option is to repeat a navigational bronchoscopy, get a tissue diagnosis and then let this guide therapy.  I will try to reschedule her for 12/21.  Her PET scan is scheduled for that same day.  I may have to reschedule it or she may have to go straight to get the PET scan after our procedure.  COPD (chronic obstructive pulmonary disease) (HCC) Obstructive lung disease confirmed on pulmonary function testing on 12/10.  She does have exertional symptoms.  I would like to try her on Spiriva to see if she gets benefit.  Baltazar Apo, MD, PhD 05/29/2020, 2:41 PM  [facility] [phone] or if no answer [phone]

## 2020-05-29 NOTE — Assessment & Plan Note (Signed)
Obstructive lung disease confirmed on pulmonary function testing on 12/10.  She does have exertional symptoms.  I would like to try her on Spiriva to see if she gets benefit.

## 2020-05-29 NOTE — Assessment & Plan Note (Signed)
Unfortunately nondiagnostic cytology from her original navigational bronchoscopy.  Check pulmonary function testing to see if she might be a candidate for primary resection.  Her FEV1 is 1.22 L, which is concerning.  I do not think she would do well with a right upper lobe lobectomy.  She also has a diffusion defect.  Discussed options with her.  I think the best option is to repeat a navigational bronchoscopy, get a tissue diagnosis and then let this guide therapy.  I will try to reschedule her for 12/21.  Her PET scan is scheduled for that same day.  I may have to reschedule it or she may have to go straight to get the PET scan after our procedure.

## 2020-05-29 NOTE — Patient Instructions (Addendum)
Your pathology results from bronchoscopy done on 12/7 showed atypical cells but not enough to fully diagnose lung cancer.  These results as well as her abnormal CT remain very suspicious that your right upper lobe lesion is lung cancer.  We talked today about the options for getting a confirmed diagnosis.  Based on your pulmonary function testing, I do not believe you are a good candidate for primary surgical resection of the lesion.  I recommend instead that we plan to repeat your bronchoscopy to get more tissue, hopefully confirm a diagnosis.  Once we have done this we should be able to make recommendations about appropriate treatments that may include radiation, chemotherapy, etc. We will try to schedule your repeat bronchoscopy for 12/21 We may need to reschedule your PET scan, change it from 12/21.  We will work on this with you. Please try starting Spiriva Respimat, 2 puffs once a day to see if this helps your breathing. Follow with Dr Delton Coombes in 1 month

## 2020-05-31 ENCOUNTER — Other Ambulatory Visit: Payer: Self-pay

## 2020-05-31 ENCOUNTER — Telehealth: Payer: Self-pay

## 2020-05-31 DIAGNOSIS — Z720 Tobacco use: Secondary | ICD-10-CM | POA: Insufficient documentation

## 2020-05-31 DIAGNOSIS — I739 Peripheral vascular disease, unspecified: Secondary | ICD-10-CM

## 2020-05-31 DIAGNOSIS — E785 Hyperlipidemia, unspecified: Secondary | ICD-10-CM | POA: Insufficient documentation

## 2020-05-31 DIAGNOSIS — Z860100 Personal history of colon polyps, unspecified: Secondary | ICD-10-CM | POA: Insufficient documentation

## 2020-05-31 DIAGNOSIS — E1121 Type 2 diabetes mellitus with diabetic nephropathy: Secondary | ICD-10-CM | POA: Insufficient documentation

## 2020-05-31 DIAGNOSIS — G629 Polyneuropathy, unspecified: Secondary | ICD-10-CM | POA: Insufficient documentation

## 2020-05-31 NOTE — Telephone Encounter (Signed)
RN from [facility] called to report patient called him with bleeding from her groin. Called patient back, she is having bleeding from her groin. Not oozing, but not spurting or copious- says sometimes she notices blood dripping when she goes to the bathroom. Instructed patient on holding pressure and going to the ED if bleeding worsens - she is on the schedule for office tomorrow.

## 2020-06-01 ENCOUNTER — Encounter (HOSPITAL_COMMUNITY)
Admission: RE | Disposition: A | Discharge status: Home / Self Care | Payer: Self-pay | Source: Home / Self Care | Attending: Vascular Surgery

## 2020-06-01 ENCOUNTER — Inpatient Hospital Stay (HOSPITAL_COMMUNITY): Payer: BC Managed Care – PPO

## 2020-06-01 ENCOUNTER — Other Ambulatory Visit: Payer: Self-pay

## 2020-06-01 ENCOUNTER — Ambulatory Visit (INDEPENDENT_AMBULATORY_CARE_PROVIDER_SITE_OTHER)
Admission: RE | Admit: 2020-06-01 | Discharge: 2020-06-01 | Disposition: A | Discharge status: Home / Self Care | Payer: BC Managed Care – PPO | Source: Ambulatory Visit | Attending: Vascular Surgery | Admitting: Vascular Surgery

## 2020-06-01 ENCOUNTER — Ambulatory Visit (INDEPENDENT_AMBULATORY_CARE_PROVIDER_SITE_OTHER): Payer: Self-pay | Admitting: Physician Assistant

## 2020-06-01 ENCOUNTER — Inpatient Hospital Stay (HOSPITAL_COMMUNITY): Payer: BC Managed Care – PPO | Admitting: Certified Registered"

## 2020-06-01 ENCOUNTER — Inpatient Hospital Stay (HOSPITAL_COMMUNITY)
Admission: RE | Admit: 2020-06-01 | Discharge: 2020-06-07 | DRG: 253 | Disposition: A | Discharge status: Home / Self Care | Payer: BC Managed Care – PPO | Attending: Vascular Surgery | Admitting: Vascular Surgery

## 2020-06-01 ENCOUNTER — Encounter (HOSPITAL_COMMUNITY): Payer: Self-pay

## 2020-06-01 VITALS — BP 149/55 | HR 69 | Temp 98.2°F | Resp 20 | Ht 68.0 in | Wt 183.8 lb

## 2020-06-01 DIAGNOSIS — J449 Chronic obstructive pulmonary disease, unspecified: Secondary | ICD-10-CM | POA: Diagnosis present

## 2020-06-01 DIAGNOSIS — E1151 Type 2 diabetes mellitus with diabetic peripheral angiopathy without gangrene: Secondary | ICD-10-CM | POA: Diagnosis present

## 2020-06-01 DIAGNOSIS — T8132XA Disruption of internal operation (surgical) wound, not elsewhere classified, initial encounter: Secondary | ICD-10-CM | POA: Diagnosis present

## 2020-06-01 DIAGNOSIS — I739 Peripheral vascular disease, unspecified: Secondary | ICD-10-CM | POA: Insufficient documentation

## 2020-06-01 DIAGNOSIS — D62 Acute posthemorrhagic anemia: Secondary | ICD-10-CM | POA: Diagnosis not present

## 2020-06-01 DIAGNOSIS — Z20822 Contact with and (suspected) exposure to covid-19: Secondary | ICD-10-CM | POA: Diagnosis present

## 2020-06-01 DIAGNOSIS — Z7984 Long term (current) use of oral hypoglycemic drugs: Secondary | ICD-10-CM

## 2020-06-01 DIAGNOSIS — E7800 Pure hypercholesterolemia, unspecified: Secondary | ICD-10-CM | POA: Diagnosis present

## 2020-06-01 DIAGNOSIS — I97638 Postprocedural hematoma of a circulatory system organ or structure following other circulatory system procedure: Secondary | ICD-10-CM | POA: Diagnosis present

## 2020-06-01 DIAGNOSIS — J984 Other disorders of lung: Secondary | ICD-10-CM | POA: Diagnosis not present

## 2020-06-01 DIAGNOSIS — Z452 Encounter for adjustment and management of vascular access device: Secondary | ICD-10-CM

## 2020-06-01 DIAGNOSIS — Y828 Other medical devices associated with adverse incidents: Secondary | ICD-10-CM | POA: Diagnosis present

## 2020-06-01 DIAGNOSIS — R918 Other nonspecific abnormal finding of lung field: Secondary | ICD-10-CM | POA: Diagnosis present

## 2020-06-01 DIAGNOSIS — Z9889 Other specified postprocedural states: Secondary | ICD-10-CM

## 2020-06-01 DIAGNOSIS — Z79899 Other long term (current) drug therapy: Secondary | ICD-10-CM

## 2020-06-01 DIAGNOSIS — I10 Essential (primary) hypertension: Secondary | ICD-10-CM | POA: Diagnosis present

## 2020-06-01 DIAGNOSIS — T82868A Thrombosis of vascular prosthetic devices, implants and grafts, initial encounter: Secondary | ICD-10-CM | POA: Diagnosis present

## 2020-06-01 DIAGNOSIS — Z7982 Long term (current) use of aspirin: Secondary | ICD-10-CM | POA: Diagnosis not present

## 2020-06-01 DIAGNOSIS — Z419 Encounter for procedure for purposes other than remedying health state, unspecified: Secondary | ICD-10-CM

## 2020-06-01 DIAGNOSIS — F17210 Nicotine dependence, cigarettes, uncomplicated: Secondary | ICD-10-CM | POA: Diagnosis present

## 2020-06-01 HISTORY — PX: GROIN DISSECTION: SHX5250

## 2020-06-01 HISTORY — PX: FEMORAL-FEMORAL BYPASS GRAFT: SHX936

## 2020-06-01 HISTORY — PX: OTHER SURGICAL HISTORY: SHX169

## 2020-06-01 HISTORY — PX: THROMBECTOMY OF BYPASS GRAFT FEMORAL- POPLITEAL ARTERY: SHX6902

## 2020-06-01 HISTORY — PX: INTRAOPERATIVE ARTERIOGRAM: SHX5157

## 2020-06-01 HISTORY — PX: THROMBECTOMY FEMORAL ARTERY: SHX6406

## 2020-06-01 LAB — POCT I-STAT, CHEM 8
BUN: 27 mg/dL — ABNORMAL HIGH (ref 8–23)
Calcium, Ion: 1.13 mmol/L — ABNORMAL LOW (ref 1.15–1.40)
Chloride: 100 mmol/L (ref 98–111)
Creatinine, Ser: 0.7 mg/dL (ref 0.44–1.00)
Glucose, Bld: 164 mg/dL — ABNORMAL HIGH (ref 70–99)
HCT: 23 % — ABNORMAL LOW (ref 36.0–46.0)
Hemoglobin: 7.8 g/dL — ABNORMAL LOW (ref 12.0–15.0)
Potassium: 4.2 mmol/L (ref 3.5–5.1)
Sodium: 133 mmol/L — ABNORMAL LOW (ref 135–145)
TCO2: 21 mmol/L — ABNORMAL LOW (ref 22–32)

## 2020-06-01 LAB — CREATININE, SERUM
Creatinine, Ser: 1.07 mg/dL — ABNORMAL HIGH (ref 0.44–1.00)
GFR, Estimated: 56 mL/min — ABNORMAL LOW (ref >60)

## 2020-06-01 LAB — POCT I-STAT 7, (LYTES, BLD GAS, ICA,H+H)
Acid-base deficit: 4 mmol/L — ABNORMAL HIGH (ref 0.0–2.0)
Bicarbonate: 23.3 mmol/L (ref 20.0–28.0)
Calcium, Ion: 1.06 mmol/L — ABNORMAL LOW (ref 1.15–1.40)
HCT: 27 % — ABNORMAL LOW (ref 36.0–46.0)
Hemoglobin: 9.2 g/dL — ABNORMAL LOW (ref 12.0–15.0)
O2 Saturation: 100 %
Patient temperature: 36.3
Potassium: 4.9 mmol/L (ref 3.5–5.1)
Sodium: 132 mmol/L — ABNORMAL LOW (ref 135–145)
TCO2: 25 mmol/L (ref 22–32)
pCO2 arterial: 49.8 mmHg — ABNORMAL HIGH (ref 32.0–48.0)
pH, Arterial: 7.275 — ABNORMAL LOW (ref 7.350–7.450)
pO2, Arterial: 228 mmHg — ABNORMAL HIGH (ref 83.0–108.0)

## 2020-06-01 LAB — CBC
HCT: 26.8 % — ABNORMAL LOW (ref 36.0–46.0)
HCT: 28.3 % — ABNORMAL LOW (ref 36.0–46.0)
Hemoglobin: 8.3 g/dL — ABNORMAL LOW (ref 12.0–15.0)
Hemoglobin: 9.5 g/dL — ABNORMAL LOW (ref 12.0–15.0)
MCH: 29.4 pg (ref 26.0–34.0)
MCH: 30.7 pg (ref 26.0–34.0)
MCHC: 31 g/dL (ref 30.0–36.0)
MCHC: 33.6 g/dL (ref 30.0–36.0)
MCV: 95 fL (ref 80.0–100.0)
MCV: 91.6 fL (ref 80.0–100.0)
Platelets: 221 10*3/uL (ref 150–400)
Platelets: 157 10*3/uL (ref 150–400)
RBC: 2.82 MIL/uL — ABNORMAL LOW (ref 3.87–5.11)
RBC: 3.09 MIL/uL — ABNORMAL LOW (ref 3.87–5.11)
RDW: 15.6 % — ABNORMAL HIGH (ref 11.5–15.5)
RDW: 14.9 % (ref 11.5–15.5)
WBC: 15.6 10*3/uL — ABNORMAL HIGH (ref 4.0–10.5)
WBC: 14.6 10*3/uL — ABNORMAL HIGH (ref 4.0–10.5)
nRBC: 0 % (ref 0.0–0.2)
nRBC: 0 % (ref 0.0–0.2)

## 2020-06-01 LAB — BASIC METABOLIC PANEL
Anion gap: 16 — ABNORMAL HIGH (ref 5–15)
BUN: 26 mg/dL — ABNORMAL HIGH (ref 8–23)
CO2: 19 mmol/L — ABNORMAL LOW (ref 22–32)
Calcium: 8.5 mg/dL — ABNORMAL LOW (ref 8.9–10.3)
Chloride: 97 mmol/L — ABNORMAL LOW (ref 98–111)
Creatinine, Ser: 1 mg/dL (ref 0.44–1.00)
GFR, Estimated: >60 mL/min (ref >60)
Glucose, Bld: 165 mg/dL — ABNORMAL HIGH (ref 70–99)
Potassium: 4.3 mmol/L (ref 3.5–5.1)
Sodium: 132 mmol/L — ABNORMAL LOW (ref 135–145)

## 2020-06-01 LAB — SARS CORONAVIRUS 2 BY RT PCR (HOSPITAL ORDER, PERFORMED IN ~~LOC~~ HOSPITAL LAB): SARS Coronavirus 2: NEGATIVE

## 2020-06-01 LAB — PREPARE RBC (CROSSMATCH)

## 2020-06-01 LAB — GLUCOSE, CAPILLARY: Glucose-Capillary: 177 mg/dL — ABNORMAL HIGH (ref 70–99)

## 2020-06-01 LAB — POCT ACTIVATED CLOTTING TIME
Activated Clotting Time: 196 seconds
Activated Clotting Time: 243 seconds

## 2020-06-01 SURGERY — EXPLORATION, INGUINAL REGION
Anesthesia: General | Laterality: Right

## 2020-06-01 MED ORDER — BENAZEPRIL HCL 5 MG PO TABS
10.0000 mg | ORAL_TABLET | Freq: Every day | ORAL | Status: DC
  Administered 2020-06-02 – 2020-06-07 (×6): 10 mg via ORAL
  Filled 2020-06-01 (×6): qty 2

## 2020-06-01 MED ORDER — SODIUM CHLORIDE 0.9% IV SOLUTION: Freq: Once | INTRAVENOUS | Status: DC

## 2020-06-01 MED ORDER — GLUCERNA SHAKE PO LIQD
237.0000 mL | Freq: Three times a day (TID) | ORAL | Status: DC
  Administered 2020-06-02 – 2020-06-07 (×13): 237 mL via ORAL

## 2020-06-01 MED ORDER — SODIUM CHLORIDE 0.9 % IV SOLN: 500.0000 mL | Freq: Once | INTRAVENOUS | Status: DC | PRN

## 2020-06-01 MED ORDER — HYDRALAZINE HCL 20 MG/ML IJ SOLN
5.0000 mg | INTRAMUSCULAR | Status: DC | PRN
Start: 2020-06-01 — End: 2020-06-07

## 2020-06-01 MED ORDER — ONDANSETRON HCL 4 MG/2ML IJ SOLN
INTRAMUSCULAR | Status: DC | PRN
  Administered 2020-06-01: 4 mg via INTRAVENOUS

## 2020-06-01 MED ORDER — MIDAZOLAM HCL 2 MG/2ML IJ SOLN
INTRAMUSCULAR | Status: AC
  Filled 2020-06-01: qty 2

## 2020-06-01 MED ORDER — ACETAMINOPHEN 650 MG RE SUPP
325.0000 mg | RECTAL | Status: DC | PRN
Start: 2020-06-01 — End: 2020-06-07

## 2020-06-01 MED ORDER — ALUM & MAG HYDROXIDE-SIMETH 200-200-20 MG/5ML PO SUSP: 15.0000 mL | ORAL | Status: DC | PRN

## 2020-06-01 MED ORDER — TIOTROPIUM BROMIDE MONOHYDRATE 1.25 MCG/ACT IN AERS: 2.0000 | INHALATION_SPRAY | Freq: Every day | RESPIRATORY_TRACT | Status: DC

## 2020-06-01 MED ORDER — HEPARIN SODIUM (PORCINE) 5000 UNIT/ML IJ SOLN: 5000.0000 [IU] | Freq: Three times a day (TID) | INTRAMUSCULAR | Status: DC

## 2020-06-01 MED ORDER — DEXMEDETOMIDINE (PRECEDEX) IN NS 20 MCG/5ML (4 MCG/ML) IV SYRINGE
PREFILLED_SYRINGE | INTRAVENOUS | Status: DC | PRN
  Administered 2020-06-01: 16 ug via INTRAVENOUS
  Administered 2020-06-01: 8 ug via INTRAVENOUS

## 2020-06-01 MED ORDER — SODIUM CHLORIDE 0.9 % IV SOLN
INTRAVENOUS | Status: AC
  Filled 2020-06-01: qty 1.2

## 2020-06-01 MED ORDER — CEFAZOLIN SODIUM-DEXTROSE 2-4 GM/100ML-% IV SOLN
2.0000 g | Freq: Three times a day (TID) | INTRAVENOUS | Status: AC
  Administered 2020-06-01 – 2020-06-02 (×2): 2 g via INTRAVENOUS
  Filled 2020-06-01 (×2): qty 100

## 2020-06-01 MED ORDER — ACETAMINOPHEN 325 MG PO TABS
325.0000 mg | ORAL_TABLET | ORAL | Status: DC | PRN
  Administered 2020-06-03: 650 mg via ORAL
  Filled 2020-06-01: qty 2

## 2020-06-01 MED ORDER — HEPARIN (PORCINE) 25000 UT/250ML-% IV SOLN
800.0000 [IU]/h | INTRAVENOUS | Status: DC
  Administered 2020-06-01: 22:00:00 800 [IU]/h via INTRAVENOUS
  Filled 2020-06-01: qty 250

## 2020-06-01 MED ORDER — PHENYLEPHRINE 40 MCG/ML (10ML) SYRINGE FOR IV PUSH (FOR BLOOD PRESSURE SUPPORT)
PREFILLED_SYRINGE | INTRAVENOUS | Status: DC | PRN
  Administered 2020-06-01: 120 ug via INTRAVENOUS

## 2020-06-01 MED ORDER — ASPIRIN EC 81 MG PO TBEC
81.0000 mg | DELAYED_RELEASE_TABLET | Freq: Every day | ORAL | Status: DC
  Administered 2020-06-02 – 2020-06-07 (×6): 81 mg via ORAL
  Filled 2020-06-01 (×6): qty 1

## 2020-06-01 MED ORDER — LIDOCAINE 2% (20 MG/ML) 5 ML SYRINGE
INTRAMUSCULAR | Status: DC | PRN
  Administered 2020-06-01: 100 mg via INTRAVENOUS

## 2020-06-01 MED ORDER — DEXAMETHASONE SODIUM PHOSPHATE 10 MG/ML IJ SOLN
INTRAMUSCULAR | Status: DC | PRN
  Administered 2020-06-01: 5 mg via INTRAVENOUS

## 2020-06-01 MED ORDER — HYDROMORPHONE HCL 1 MG/ML IJ SOLN
0.5000 mg | INTRAMUSCULAR | Status: DC | PRN
  Administered 2020-06-04: 1 mg via INTRAVENOUS
  Filled 2020-06-01: qty 1

## 2020-06-01 MED ORDER — LACTATED RINGERS IV SOLN: INTRAVENOUS | Status: DC

## 2020-06-01 MED ORDER — PROPOFOL 10 MG/ML IV BOLUS
INTRAVENOUS | Status: AC
  Filled 2020-06-01: qty 20

## 2020-06-01 MED ORDER — CHLORHEXIDINE GLUCONATE 0.12 % MT SOLN
15.0000 mL | Freq: Once | OROMUCOSAL | Status: AC
  Administered 2020-06-01: 14:00:00 15 mL via OROMUCOSAL
  Filled 2020-06-01: qty 15

## 2020-06-01 MED ORDER — VITAMIN D 25 MCG (1000 UNIT) PO TABS
1000.0000 [IU] | ORAL_TABLET | Freq: Every day | ORAL | Status: DC
  Administered 2020-06-02 – 2020-06-07 (×6): 1000 [IU] via ORAL
  Filled 2020-06-01 (×6): qty 1

## 2020-06-01 MED ORDER — ONDANSETRON HCL 4 MG/2ML IJ SOLN: 4.0000 mg | Freq: Four times a day (QID) | INTRAMUSCULAR | Status: DC | PRN

## 2020-06-01 MED ORDER — SENNOSIDES-DOCUSATE SODIUM 8.6-50 MG PO TABS
1.0000 | ORAL_TABLET | Freq: Every evening | ORAL | Status: DC | PRN
  Administered 2020-06-04 – 2020-06-05 (×3): 1 via ORAL
  Filled 2020-06-01 (×3): qty 1

## 2020-06-01 MED ORDER — ORAL CARE MOUTH RINSE: 15.0000 mL | Freq: Once | OROMUCOSAL | Status: AC

## 2020-06-01 MED ORDER — POTASSIUM CHLORIDE CRYS ER 20 MEQ PO TBCR: 20.0000 meq | EXTENDED_RELEASE_TABLET | Freq: Every day | ORAL | Status: DC | PRN

## 2020-06-01 MED ORDER — ALBUMIN HUMAN 5 % IV SOLN: INTRAVENOUS | Status: DC | PRN

## 2020-06-01 MED ORDER — EPHEDRINE SULFATE-NACL 50-0.9 MG/10ML-% IV SOSY
PREFILLED_SYRINGE | INTRAVENOUS | Status: DC | PRN
  Administered 2020-06-01: 10 mg via INTRAVENOUS

## 2020-06-01 MED ORDER — HYDRALAZINE HCL 20 MG/ML IJ SOLN
INTRAMUSCULAR | Status: DC | PRN
  Administered 2020-06-01: 5 mg via INTRAVENOUS

## 2020-06-01 MED ORDER — LABETALOL HCL 5 MG/ML IV SOLN
10.0000 mg | INTRAVENOUS | Status: DC | PRN
Start: 2020-06-01 — End: 2020-06-07

## 2020-06-01 MED ORDER — LACTATED RINGERS IV SOLN: INTRAVENOUS | Status: DC | PRN

## 2020-06-01 MED ORDER — DOCUSATE SODIUM 100 MG PO CAPS
100.0000 mg | ORAL_CAPSULE | Freq: Every day | ORAL | Status: DC
  Administered 2020-06-02 – 2020-06-07 (×6): 100 mg via ORAL
  Filled 2020-06-01 (×6): qty 1

## 2020-06-01 MED ORDER — OXYCODONE-ACETAMINOPHEN 5-325 MG PO TABS
1.0000 | ORAL_TABLET | ORAL | Status: DC | PRN
  Administered 2020-06-01: 1 via ORAL
  Administered 2020-06-02 (×2): 2 via ORAL
  Administered 2020-06-03: 1 via ORAL
  Administered 2020-06-03 – 2020-06-04 (×3): 2 via ORAL
  Administered 2020-06-04 – 2020-06-05 (×2): 1 via ORAL
  Administered 2020-06-05 – 2020-06-06 (×2): 2 via ORAL
  Filled 2020-06-01 (×2): qty 2
  Filled 2020-06-01 (×2): qty 1
  Filled 2020-06-01 (×4): qty 2
  Filled 2020-06-01: qty 1
  Filled 2020-06-01 (×2): qty 2

## 2020-06-01 MED ORDER — METFORMIN HCL 500 MG PO TABS
1000.0000 mg | ORAL_TABLET | Freq: Two times a day (BID) | ORAL | Status: DC
  Administered 2020-06-02 – 2020-06-07 (×11): 1000 mg via ORAL
  Filled 2020-06-01 (×11): qty 2

## 2020-06-01 MED ORDER — HEPARIN SODIUM (PORCINE) 1000 UNIT/ML IJ SOLN
INTRAMUSCULAR | Status: DC | PRN
  Administered 2020-06-01: 8000 [IU] via INTRAVENOUS
  Administered 2020-06-01: 3000 [IU] via INTRAVENOUS

## 2020-06-01 MED ORDER — BISACODYL 5 MG PO TBEC
5.0000 mg | DELAYED_RELEASE_TABLET | Freq: Every day | ORAL | Status: DC | PRN
  Administered 2020-06-04 – 2020-06-05 (×3): 5 mg via ORAL
  Filled 2020-06-01 (×3): qty 1

## 2020-06-01 MED ORDER — EMPAGLIFLOZIN 25 MG PO TABS
25.0000 mg | ORAL_TABLET | Freq: Every day | ORAL | Status: DC
  Administered 2020-06-02 – 2020-06-07 (×6): 25 mg via ORAL
  Filled 2020-06-01 (×6): qty 1

## 2020-06-01 MED ORDER — 0.9 % SODIUM CHLORIDE (POUR BTL) OPTIME
TOPICAL | Status: DC | PRN
  Administered 2020-06-01: 14:00:00 2000 mL

## 2020-06-01 MED ORDER — MIDAZOLAM HCL 5 MG/5ML IJ SOLN
INTRAMUSCULAR | Status: DC | PRN
  Administered 2020-06-01: 1 mg via INTRAVENOUS

## 2020-06-01 MED ORDER — SODIUM CHLORIDE 0.9 % IV SOLN: INTRAVENOUS | Status: DC | PRN

## 2020-06-01 MED ORDER — SODIUM CHLORIDE 0.9 % IV SOLN
INTRAVENOUS | Status: DC | PRN
  Administered 2020-06-01: 15:00:00 50 ug/min via INTRAVENOUS

## 2020-06-01 MED ORDER — FLEET ENEMA 7-19 GM/118ML RE ENEM
1.0000 | ENEMA | Freq: Once | RECTAL | Status: AC | PRN
  Administered 2020-06-05: 20:00:00 1 via RECTAL
  Filled 2020-06-01: qty 1

## 2020-06-01 MED ORDER — PANTOPRAZOLE SODIUM 40 MG PO TBEC
40.0000 mg | DELAYED_RELEASE_TABLET | Freq: Every day | ORAL | Status: DC
  Administered 2020-06-02 – 2020-06-07 (×6): 40 mg via ORAL
  Filled 2020-06-01 (×6): qty 1

## 2020-06-01 MED ORDER — UMECLIDINIUM BROMIDE 62.5 MCG/INH IN AEPB
1.0000 | INHALATION_SPRAY | Freq: Every day | RESPIRATORY_TRACT | Status: DC
  Administered 2020-06-03 – 2020-06-07 (×4): 1 via RESPIRATORY_TRACT
  Filled 2020-06-01: qty 7

## 2020-06-01 MED ORDER — MAGNESIUM SULFATE 2 GM/50ML IV SOLN: 2.0000 g | Freq: Every day | INTRAVENOUS | Status: DC | PRN

## 2020-06-01 MED ORDER — IOHEXOL 300 MG/ML  SOLN: INTRAMUSCULAR | Status: DC | PRN

## 2020-06-01 MED ORDER — ATORVASTATIN CALCIUM 40 MG PO TABS
40.0000 mg | ORAL_TABLET | Freq: Every day | ORAL | Status: DC
  Administered 2020-06-01 – 2020-06-06 (×6): 40 mg via ORAL
  Filled 2020-06-01 (×6): qty 1

## 2020-06-01 MED ORDER — SODIUM CHLORIDE 0.9 % IV SOLN
INTRAVENOUS | Status: DC | PRN
  Administered 2020-06-01: 14:00:00 500 mL

## 2020-06-01 MED ORDER — METOPROLOL TARTRATE 5 MG/5ML IV SOLN: 2.0000 mg | INTRAVENOUS | Status: DC | PRN

## 2020-06-01 MED ORDER — IODIXANOL 320 MG/ML IV SOLN
INTRAVENOUS | Status: DC | PRN
Start: 2020-06-01 — End: 2020-06-01
  Administered 2020-06-01: 16:00:00 35 mL via INTRA_ARTERIAL

## 2020-06-01 MED ORDER — PROPOFOL 10 MG/ML IV BOLUS
INTRAVENOUS | Status: DC | PRN
  Administered 2020-06-01: 150 mg via INTRAVENOUS

## 2020-06-01 MED ORDER — GUAIFENESIN-DM 100-10 MG/5ML PO SYRP: 15.0000 mL | ORAL_SOLUTION | ORAL | Status: DC | PRN

## 2020-06-01 MED ORDER — FENTANYL CITRATE (PF) 250 MCG/5ML IJ SOLN
INTRAMUSCULAR | Status: AC
  Filled 2020-06-01: qty 5

## 2020-06-01 MED ORDER — CEFAZOLIN SODIUM-DEXTROSE 1-4 GM/50ML-% IV SOLN
INTRAVENOUS | Status: DC | PRN
  Administered 2020-06-01: 2 g via INTRAVENOUS

## 2020-06-01 MED ORDER — SUGAMMADEX SODIUM 200 MG/2ML IV SOLN
INTRAVENOUS | Status: DC | PRN
  Administered 2020-06-01: 200 mg via INTRAVENOUS

## 2020-06-01 MED ORDER — SODIUM CHLORIDE 0.9 % IV SOLN: INTRAVENOUS | Status: DC

## 2020-06-01 MED ORDER — FENTANYL CITRATE (PF) 250 MCG/5ML IJ SOLN
INTRAMUSCULAR | Status: DC | PRN
  Administered 2020-06-01: 50 ug via INTRAVENOUS
  Administered 2020-06-01: 25 ug via INTRAVENOUS
  Administered 2020-06-01: 50 ug via INTRAVENOUS
  Administered 2020-06-01: 75 ug via INTRAVENOUS
  Administered 2020-06-01: 50 ug via INTRAVENOUS

## 2020-06-01 MED ORDER — PHENOL 1.4 % MT LIQD: 1.0000 | OROMUCOSAL | Status: DC | PRN

## 2020-06-01 MED ORDER — ROCURONIUM BROMIDE 10 MG/ML (PF) SYRINGE
PREFILLED_SYRINGE | INTRAVENOUS | Status: DC | PRN
  Administered 2020-06-01: 20 mg via INTRAVENOUS
  Administered 2020-06-01: 50 mg via INTRAVENOUS
  Administered 2020-06-01: 30 mg via INTRAVENOUS

## 2020-06-01 SURGICAL SUPPLY — 53 items
BLADE CLIPPER SURG (BLADE) IMPLANT
CANISTER SUCT 3000ML PPV (MISCELLANEOUS) ×10 IMPLANT
CATH EMB 4FR 40CM (CATHETERS) ×10 IMPLANT
CATH EMB 4FR 80CM (CATHETERS) ×5 IMPLANT
CLIP VESOCCLUDE MED 24/CT (CLIP) IMPLANT
CLIP VESOCCLUDE MED 6/CT (CLIP) ×5 IMPLANT
CLIP VESOCCLUDE SM WIDE 24/CT (CLIP) ×5 IMPLANT
CLIP VESOCCLUDE SM WIDE 6/CT (CLIP) ×5 IMPLANT
COVER WAND RF STERILE (DRAPES) IMPLANT
DRAIN CHANNEL 15F RND FF W/TCR (WOUND CARE) IMPLANT
DRAPE C-ARM 42X72 X-RAY (DRAPES) ×5 IMPLANT
DRAPE X-RAY CASS 24X20 (DRAPES) ×5 IMPLANT
DRSG COVADERM 4X6 (GAUZE/BANDAGES/DRESSINGS) ×5 IMPLANT
DRSG COVADERM 4X8 (GAUZE/BANDAGES/DRESSINGS) ×5 IMPLANT
ELECT REM PT RETURN 9FT ADLT (ELECTROSURGICAL) ×10
ELECTRODE REM PT RTRN 9FT ADLT (ELECTROSURGICAL) ×6 IMPLANT
EVACUATOR SILICONE 100CC (DRAIN) IMPLANT
GAUZE 4X4 16PLY RFD (DISPOSABLE) IMPLANT
GLOVE BIO SURGEON STRL SZ7.5 (GLOVE) ×15 IMPLANT
GLOVE BIOGEL PI IND STRL 8 (GLOVE) ×6 IMPLANT
GLOVE BIOGEL PI INDICATOR 8 (GLOVE) ×4
GOWN STRL REUS W/ TWL LRG LVL3 (GOWN DISPOSABLE) ×6 IMPLANT
GOWN STRL REUS W/ TWL XL LVL3 (GOWN DISPOSABLE) ×6 IMPLANT
GOWN STRL REUS W/TWL LRG LVL3 (GOWN DISPOSABLE) ×6
GOWN STRL REUS W/TWL XL LVL3 (GOWN DISPOSABLE) ×6
GRAFT GORETEX 6X40 (Vascular Products) ×5 IMPLANT
HEMOSTAT SPONGE AVITENE ULTRA (HEMOSTASIS) IMPLANT
INSERT FOGARTY SM (MISCELLANEOUS) IMPLANT
KIT BASIN OR (CUSTOM PROCEDURE TRAY) ×5 IMPLANT
KIT TURNOVER KIT B (KITS) ×5 IMPLANT
NS IRRIG 1000ML POUR BTL (IV SOLUTION) ×10 IMPLANT
PACK PERIPHERAL VASCULAR (CUSTOM PROCEDURE TRAY) ×5 IMPLANT
PAD ARMBOARD 7.5X6 YLW CONV (MISCELLANEOUS) ×10 IMPLANT
SET MICROPUNCTURE 5F STIFF (MISCELLANEOUS) ×5 IMPLANT
SPONGE LAP 18X18 RF (DISPOSABLE) ×10 IMPLANT
STOPCOCK 3 WAY HIGH PRESSURE (MISCELLANEOUS) ×3
STOPCOCK 3WAY HIGH PRESSURE (MISCELLANEOUS) ×3 IMPLANT
SUT ETHILON 2 0 PSLX (SUTURE) ×20 IMPLANT
SUT MNCRL AB 4-0 PS2 18 (SUTURE) IMPLANT
SUT PROLENE 5 0 C 1 24 (SUTURE) ×40 IMPLANT
SUT PROLENE 5 0 CC1 (SUTURE) IMPLANT
SUT PROLENE 6 0 BV (SUTURE) ×10 IMPLANT
SUT SILK 2 0 PERMA HAND 18 BK (SUTURE) IMPLANT
SUT VIC AB 2-0 CT1 27 (SUTURE) ×6
SUT VIC AB 2-0 CT1 TAPERPNT 27 (SUTURE) ×9 IMPLANT
SUT VIC AB 3-0 SH 27 (SUTURE) ×6
SUT VIC AB 3-0 SH 27X BRD (SUTURE) ×6 IMPLANT
SYR 20ML LL LF (SYRINGE) ×5 IMPLANT
TOWEL GREEN STERILE (TOWEL DISPOSABLE) ×5 IMPLANT
TRAY FOLEY MTR SLVR 14FR STAT (SET/KITS/TRAYS/PACK) ×5 IMPLANT
TRAY FOLEY MTR SLVR 16FR STAT (SET/KITS/TRAYS/PACK) IMPLANT
UNDERPAD 30X36 HEAVY ABSORB (UNDERPADS AND DIAPERS) ×5 IMPLANT
WATER STERILE IRR 1000ML POUR (IV SOLUTION) ×5 IMPLANT

## 2020-06-01 NOTE — Progress Notes (Signed)
° °  Doppler signals found in PACU left DP/peroneal and PT DP strongest signal Right DP/PT signals  Groins with dry   Mosetta Pigeon PA-C

## 2020-06-01 NOTE — Anesthesia Procedure Notes (Signed)
Arterial Line Insertion Start/End12/16/2021 2:31 PM, 06/01/2020 2:35 AM Performed by: Lanell Matar, CRNA, CRNA  Patient location: OR. Preanesthetic checklist: patient identified, IV checked, site marked, risks and benefits discussed, surgical consent, monitors and equipment checked, pre-op evaluation, timeout performed and anesthesia consent Patient sedated Right, radial was placed Catheter size: 20 G Hand hygiene performed  and maximum sterile barriers used   Attempts: 1 Procedure performed without using ultrasound guided technique. Following insertion, Biopatch and dressing applied. Post procedure assessment: normal  Patient tolerated the procedure well with no immediate complications.

## 2020-06-01 NOTE — Anesthesia Postprocedure Evaluation (Signed)
Anesthesia Post Note  Patient: Holly Stevenson  Procedure(s) Performed: RIGHT GROIN EXPLORATION (Right ) THROMBECTOMY OF LEFT BYPASS GRAFT FEMORAL-POPLITEAL ARTERY (Left ) THROMBECTOMY FEMORAL- FEMORAL BYPASS (N/A ) INTERPOSITION GRAFT RIGHT COMMON FEMORAL TO FEMORAL-FEMORAL BYPASS (Right ) LEFT LOWER EXTREMITY  ARTERIOGRAM (Left )     Patient location during evaluation: PACU Anesthesia Type: General Level of consciousness: awake and alert Pain management: pain level controlled Vital Signs Assessment: post-procedure vital signs reviewed and stable Respiratory status: spontaneous breathing, nonlabored ventilation, respiratory function stable and patient connected to nasal cannula oxygen Cardiovascular status: blood pressure returned to baseline and stable Postop Assessment: no apparent nausea or vomiting Anesthetic complications: no   No complications documented.  Last Vitals:  Vitals:   06/01/20 1229 06/01/20 1730  BP: (!) 166/53 (!) 148/50  Pulse: 61 (!) 55  Resp: 20 16  Temp: 36.5 C 36.6 C  SpO2: 99% 100%    Last Pain:  Vitals:   06/01/20 1730  TempSrc:   PainSc: 0-No pain                 [name]

## 2020-06-01 NOTE — Anesthesia Procedure Notes (Signed)
Procedure Name: Intubation Date/Time: 06/01/2020 2:07 PM Performed by: Amadeo Garnet, CRNA Pre-anesthesia Checklist: Patient identified, Emergency Drugs available, Suction available and Patient being monitored Patient Re-evaluated:Patient Re-evaluated prior to induction Oxygen Delivery Method: Circle system utilized Preoxygenation: Pre-oxygenation with 100% oxygen Induction Type: IV induction Ventilation: Mask ventilation without difficulty and Oral airway inserted - appropriate to patient size Laryngoscope Size: Mac and 3 Grade View: Grade I Tube type: Oral Tube size: 7.0 mm Number of attempts: 1 Airway Equipment and Method: Stylet and Oral airway Placement Confirmation: ETT inserted through vocal cords under direct vision,  positive ETCO2 and breath sounds checked- equal and bilateral Secured at: 23 cm Tube secured with: Tape Dental Injury: Teeth and Oropharynx as per pre-operative assessment  Comments: Performed by Vernelle Emerald, SRNA

## 2020-06-01 NOTE — Transfer of Care (Signed)
Immediate Anesthesia Transfer of Care Note  Patient: Holly Stevenson  Procedure(s) Performed: RIGHT GROIN EXPLORATION (Right ) THROMBECTOMY OF LEFT BYPASS GRAFT FEMORAL-POPLITEAL ARTERY (Left ) THROMBECTOMY FEMORAL- FEMORAL BYPASS (N/A ) INTERPOSITION GRAFT RIGHT COMMON FEMORAL TO FEMORAL-FEMORAL BYPASS (Right ) LEFT LOWER EXTREMITY  ARTERIOGRAM (Left )  Patient Location: PACU  Anesthesia Type:General  Level of Consciousness: awake, alert  and oriented  Airway & Oxygen Therapy: Patient Spontanous Breathing and Patient connected to face mask oxygen  Post-op Assessment: Report given to RN and Post -op Vital signs reviewed and stable  Post vital signs: Reviewed and stable  Last Vitals:  Vitals Value Taken Time  BP 148/50 06/01/20 1727  Temp    Pulse 55 06/01/20 1731  Resp 16 06/01/20 1731  SpO2 100 % 06/01/20 1731  Vitals shown include unvalidated device data.  Last Pain:  Vitals:   06/01/20 1229  TempSrc: Oral         Complications: No complications documented.

## 2020-06-01 NOTE — Op Note (Signed)
DATE OF SERVICE: 06/01/2020  PATIENT:  Holly Stevenson  69 y.o. female  PRE-OPERATIVE DIAGNOSIS: Right groin pulsatile mass, occluded femoral-femoral bypass  POST-OPERATIVE DIAGNOSIS:  Right groin pulsatile mass, occluded femoral-femoral bypass  PROCEDURE:   1) Exploration of right groin and control of hemorrhage 2) Thrombectomy right-to-left femoral-femoral bypass graft 3) Interposition grafting right common femoral artery to femoral-femoral bypass (24mm PTFE) 4) Thrombectomy left femoral-popliteal bypass graft 5) Angiogram left lower extremity  SURGEON:  Surgeon(s) and Role:    * Leonie Douglas, MD - Primary    Cephus Shelling, MD - Assisting  ASSISTANT: Sherald Hess, MD  Lianne Cure, PA-C  An assistant was required to facilitate exposure and expedite the case.  ANESTHESIA:   general  EBL:  BLOOD ADMINISTERED: 2U PRBC; 370cc cell saver  DRAINS: none   LOCAL MEDICATIONS USED:  NONE  SPECIMEN:  none  COUNTS: confirmed correct.  TOURNIQUET:  * No tourniquets in log *  PATIENT DISPOSITION:  PACU - hemodynamically stable.   Delay start of Pharmacological VTE agent (>24hrs) due to surgical blood loss or risk of bleeding: no  INDICATION FOR PROCEDURE: Holly Stevenson is a 69 y.o. female with pulsatile hematoma in right groin 3 weeks after femoral-femoral and left femoral-popliteal bypass grafting. After careful discussion of risks, benefits, and alternatives the patient was offered exploration and repair of bleeding. The patient understood and wished to proceed.  OPERATIVE FINDINGS: femoral-femoral graft dehisced off right femoral patch angioplasty. Suture line intact on the femoral patch angioplasty. About bleeding encountered at initial exploration. Control of common femoral artery above and below dehiscence allowed exploration and thrombectomy of fem-fem graft. Interposition grafting done with no tension. Pulsatile flow restored to  left common femoral artery. No doppler flow noted in left foot. Thrombectomy of left fem-pop bypass returned thrombus. Still no doppler flow after fem-pop thrombectomy. Angiogram confirms patency of bypass and flow to the ankle. Suspect she will need to warm. Start heparin infusion post op. Will re-assess in the morning.   DESCRIPTION OF PROCEDURE: After identification of the patient in the pre-operative holding area, the patient was transferred to the operating room. The patient was positioned supine on the operating room table. Anesthesia was induced. The abdomen, groins, and bilateral legs were prepped and draped in standard fashion. A surgical pause was performed confirming correct patient, procedure, and operative location.  The right groin incision was reopened sharply with a 10 blade.  A large amount of hematoma was encountered.  This was evacuated.  Pulsatile bleeding was encountered.  This was controlled digitally.  Dr. Chestine Spore and I explored about the area of bleeding and found the previous femoral-femoral bypass he completely dehisced off of the iliofemoral patch angioplasty.  Gore-Tex suture line was noted intact at the dehiscence.  A Henley clamp was applied proximally.  A Gregory clamp was applied distally.  Hemostasis was achieved.    The patient was heparinized with 8000 units of IV heparin.  Activated clotting time measurements were used throughout the case to confirm adequate anticoagulation.  A #4 Fogarty embolectomy balloon catheter was passed into the open end of the femoral-femoral bypass graft and thrombectomy performed of this graft.  Thrombectomy was repeated until no clot return.  Good backbleeding was noted.  A Gregory clamp was applied to this graft.  A 6 mm PTFE vascular graft was brought onto the field, spatulated, and cut to fit a 5 cm interposition graft between the dehiscence and femoral-femoral bypass.  The dehisced end of the femoral-femoral graft was freshened.   End-to-side anastomosis between the new 6 mm graft and iliofemoral patch angioplasty was performed with continuous running suture of 5-0 Prolene.  End to end anastomosis between the new 6 mm graft and the femoral-femoral bypass graft was performed with continuous running suture of 5-0 Prolene.  Immediately prior to completion the graft was flushed and de-aired.  The anastomosis was completed.  Pulsatile flow was noted in the graft.  Doppler flow was interrogated in the left foot.  Preoperative duplex of the left lower extremity suggested the left femoral-popliteal bypass was patent.  No Doppler flow was noted in the foot.  I reopened the left groin incision and exposed the previously placed vascular graft.  The.  Seated in good position.  Pulsatile flow was again confirmed in the femoral-femoral bypass graft.  Almost no flow was noted in the femoral-popliteal bypass graft.  A Satinsky clamp was applied to the hood of the femoral-popliteal bypass graft and common femoral artery.  A transverse graftotomy was made with an 11 blade.  A #4 Fogarty embolectomy balloon catheter was proximally with no return of thrombus.  Excellent inflow was noted.  The catheter was passed distally with return of organized thrombus.  Thrombectomy was repeated until no thrombus returned.  The graftotomy fragmented while attempting simple suture closure.  A patch angioplasty of the graftotomy was performed with a remnant of the 6 mm vascular graft sewn in standard fashion with 6-0 BV 1 Prolene suture.  Hemostasis was achieved.  Again Doppler flow was interrogated in the left foot.  No Doppler flow was noted.  We elected to perform on table angiogram.  Micropuncture access was obtained in the femoral-femoral bypass graft.  Angiogram of the left femoral-femoral anastomosis, proximal left femoral popliteal bypass anastomosis, mid graft, distal left femoral-popliteal bypass anastomosis, below-knee popliteal, tibial trifurcation, and distal  tibial arteries were evaluated.  No impediment to flow was noted to the ankle.  We ended the case here.  The groins were copiously irrigated with saline.  The groins were closed in layers using 2-0 Vicryl, 3-0 Vicryl and 2-0 nylon.  Upon completion of the case instrument and sharps counts were confirmed correct. The patient was transferred to the PACU in good condition. I was present for all portions of the procedure.  Rande Brunt. Lenell Antu, MD [facility] Phone Number: [phone] 06/01/2020 5:08 PM

## 2020-06-01 NOTE — Anesthesia Preprocedure Evaluation (Signed)
Anesthesia Evaluation  Patient identified by MRN, date of birth, ID band Patient awake    Reviewed: Allergy & Precautions, NPO status , Patient's Chart, lab work & pertinent test results  Airway Mallampati: II  TM Distance: >3 FB     Dental   Pulmonary pneumonia, COPD, Current Smoker and Patient abstained from smoking.,    breath sounds clear to auscultation       Cardiovascular hypertension, + Peripheral Vascular Disease   Rhythm:Regular Rate:Normal     Neuro/Psych  Neuromuscular disease    GI/Hepatic negative GI ROS, Neg liver ROS,   Endo/Other  diabetes  Renal/GU      Musculoskeletal  (+) Arthritis ,   Abdominal   Peds  Hematology   Anesthesia Other Findings   Reproductive/Obstetrics                             Anesthesia Physical Anesthesia Plan  ASA: III  Anesthesia Plan: General   Post-op Pain Management:    Induction: Intravenous  PONV Risk Score and Plan: 2 and Ondansetron, Dexamethasone and Midazolam  Airway Management Planned: Oral ETT  Additional Equipment:   Intra-op Plan:   Post-operative Plan: Possible Post-op intubation/ventilation  Informed Consent:     Dental advisory given and History available from chart only  Plan Discussed with: CRNA and Anesthesiologist  Anesthesia Plan Comments:         Anesthesia Quick Evaluation

## 2020-06-01 NOTE — Progress Notes (Signed)
    Postoperative Visit   History of Present Illness   Holly Stevenson is a 69 y.o. year old female who presents for postoperative follow-up for: right iliofemoral endarterectomy and bovine patch angioplasty with R to L femorofemoral bypass and L femoral to AK popliteal bypass with PTFE by Dr. Hawken on 05/23/20.  She was last seen in office on 05/26/20 to evaluate bloody drainage from R groin incision however she had intact peripheral doppler signals of bilateral lower extremities at the time.  She returns today with numbness of LLE and increase in pain in L foot over the past 24 hours.  She also states bloody drainage has increased from R groin incision.  She is taking aspirin and statin daily.  She is ambulatory with a walker.  She denies rest pain R foot.   For VQI Use Only   PRE-ADM LIVING: Home  AMB STATUS: Ambulatory with Assistance   Physical Examination   Vitals:   06/01/20 1008  BP: (!) 149/55  Pulse: 69  Resp: 20  Temp: 98.2 F (36.8 C)  TempSrc: Temporal  SpO2: 99%  Weight: 183 lb 12.8 oz (83.4 kg)  Height: 5' 8" (1.727 m)    BLE: Foot warm with good cap refill; soft PT signal by doppler; R groin with large hematoma with sanguinous drainage; L groin and popliteal incision healing well; L foot without sensation; unable to find doppler signal at the ankle; foot is cold to touch  Arterial duplex demonstrates an occluded R to L fem fem bypass; L fem pop bypass appears patent however with low flow velocities   Medical Decision Making   Holly Stevenson is a 69 y.o. year old female who presents s/p right iliofemoral endarterectomy and bovine patch angioplasty with R to L femorofemoral bypass and L femoral to AK popliteal bypass with PTFE  . LLE is acutely ischemic; R to L femoral to femoral bypass is occluded based on physical exam and bypass duplex; L femoral to AK popliteal bypass is patent by duplex . Patient also has a large hematoma of R groin which may  have contributed to thrombosis of fem fem bypass . On call vascular surgeon Dr. Hawken was notified and patient will directed straight to pre-operative holding in preparation for R groin exploration, evacuation of hematoma, and thrombectomy of femoral to femoral bypass with possible BLE runoff arteriogram . Patient is aware that this is a limb threatening situation and agrees to the above surgery.  Her son-in-law will be taking her to the hospital.   Khalen Styer PA-C [facility] [place] Office: [phone]  Clinic MD: [place]  

## 2020-06-01 NOTE — Interval H&P Note (Signed)
History and Physical Interval Note:  06/01/2020 12:40 PM  Holly Stevenson  has presented today for surgery, with the diagnosis of OCLUDED FEM FEM.  The various methods of treatment have been discussed with the patient and family. After consideration of risks, benefits and other options for treatment, the patient has consented to  Procedure(s): GROIN EXPLORATION FEMORAL FEMORAL  THROMBECTOMY (Right) as a surgical intervention.  The patient's history has been reviewed, patient examined, no change in status, stable for surgery.  I have reviewed the patient's chart and labs.  Questions were answered to the patient's satisfaction.     Leonie Douglas

## 2020-06-01 NOTE — H&P (View-Only) (Signed)
    Postoperative Visit   History of Present Illness   Holly Stevenson is a 69 y.o. year old female who presents for postoperative follow-up for: right iliofemoral endarterectomy and bovine patch angioplasty with R to L femorofemoral bypass and L femoral to AK popliteal bypass with PTFE by Dr. Lenell Antu on 05/23/20.  She was last seen in office on 05/26/20 to evaluate bloody drainage from R groin incision however she had intact peripheral doppler signals of bilateral lower extremities at the time.  She returns today with numbness of LLE and increase in pain in L foot over the past 24 hours.  She also states bloody drainage has increased from R groin incision.  She is taking aspirin and statin daily.  She is ambulatory with a walker.  She denies rest pain R foot.   For VQI Use Only   PRE-ADM LIVING: Home  AMB STATUS: Ambulatory with Assistance   Physical Examination   Vitals:   06/01/20 1008  BP: (!) 149/55  Pulse: 69  Resp: 20  Temp: 98.2 F (36.8 C)  TempSrc: Temporal  SpO2: 99%  Weight: 183 lb 12.8 oz (83.4 kg)  Height: 5\' 8"  (1.727 m)    BLE: Foot warm with good cap refill; soft PT signal by doppler; R groin with large hematoma with sanguinous drainage; L groin and popliteal incision healing well; L foot without sensation; unable to find doppler signal at the ankle; foot is cold to touch  Arterial duplex demonstrates an occluded R to L fem fem bypass; L fem pop bypass appears patent however with low flow velocities   Medical Decision Making   Holly Stevenson is a 69 y.o. year old female who presents s/p right iliofemoral endarterectomy and bovine patch angioplasty with R to L femorofemoral bypass and L femoral to AK popliteal bypass with PTFE  . LLE is acutely ischemic; R to L femoral to femoral bypass is occluded based on physical exam and bypass duplex; L femoral to AK popliteal bypass is patent by duplex . Patient also has a large hematoma of R groin which may  have contributed to thrombosis of fem fem bypass . On call vascular surgeon Dr. 66 was notified and patient will directed straight to pre-operative holding in preparation for R groin exploration, evacuation of hematoma, and thrombectomy of femoral to femoral bypass with possible BLE runoff arteriogram . Patient is aware that this is a limb threatening situation and agrees to the above surgery.  Her son-in-law will be taking her to the hospital.   Lenell Antu PA-C [facility] [place] Office: [phone]  Clinic MD: [phone]

## 2020-06-01 NOTE — Anesthesia Procedure Notes (Signed)
Central Venous Catheter Insertion Performed by: Dorris Singh, MD, anesthesiologist Start/End12/16/2021 2:35 PM, 06/01/2020 2:50 PM Preanesthetic checklist: patient identified, IV checked, site marked, risks and benefits discussed, surgical consent, monitors and equipment checked, pre-op evaluation and timeout performed Position: Trendelenburg Lidocaine 1% used for infiltration and patient sedated Hand hygiene performed , maximum sterile barriers used  and Seldinger technique used Central line was placed.Double lumen Procedure performed using ultrasound guided technique. Ultrasound Notes:anatomy identified, needle tip was noted to be adjacent to the nerve/plexus identified and image(s) printed for medical record Attempts: 1 Following insertion, line sutured, dressing applied and Biopatch. Post procedure assessment: blood return through all ports  Patient tolerated the procedure well with no immediate complications.

## 2020-06-02 ENCOUNTER — Encounter (HOSPITAL_COMMUNITY): Payer: Self-pay | Admitting: Vascular Surgery

## 2020-06-02 ENCOUNTER — Telehealth: Payer: Self-pay | Admitting: Emergency Medicine

## 2020-06-02 DIAGNOSIS — R918 Other nonspecific abnormal finding of lung field: Secondary | ICD-10-CM

## 2020-06-02 LAB — LIPID PANEL
Cholesterol: 82 mg/dL (ref 0–200)
HDL: 20 mg/dL — ABNORMAL LOW (ref >40)
LDL Cholesterol: 32 mg/dL (ref 0–99)
Total CHOL/HDL Ratio: 4.1 RATIO
Triglycerides: 151 mg/dL — ABNORMAL HIGH (ref <150)
VLDL: 30 mg/dL (ref 0–40)

## 2020-06-02 LAB — POCT I-STAT 7, (LYTES, BLD GAS, ICA,H+H)
Acid-base deficit: 2 mmol/L (ref 0.0–2.0)
Bicarbonate: 23.8 mmol/L (ref 20.0–28.0)
Calcium, Ion: 1.16 mmol/L (ref 1.15–1.40)
HCT: 16 % — ABNORMAL LOW (ref 36.0–46.0)
Hemoglobin: 5.4 g/dL — CL (ref 12.0–15.0)
O2 Saturation: 100 %
Patient temperature: 36.5
Potassium: 4.4 mmol/L (ref 3.5–5.1)
Sodium: 134 mmol/L — ABNORMAL LOW (ref 135–145)
TCO2: 25 mmol/L (ref 22–32)
pCO2 arterial: 44.5 mmHg (ref 32.0–48.0)
pH, Arterial: 7.333 — ABNORMAL LOW (ref 7.350–7.450)
pO2, Arterial: 266 mmHg — ABNORMAL HIGH (ref 83.0–108.0)

## 2020-06-02 LAB — HEPARIN LEVEL (UNFRACTIONATED)
Heparin Unfractionated: <0.1 IU/mL — ABNORMAL LOW (ref 0.30–0.70)
Heparin Unfractionated: <0.1 IU/mL — ABNORMAL LOW (ref 0.30–0.70)

## 2020-06-02 LAB — BASIC METABOLIC PANEL
Anion gap: 11 (ref 5–15)
BUN: 27 mg/dL — ABNORMAL HIGH (ref 8–23)
CO2: 21 mmol/L — ABNORMAL LOW (ref 22–32)
Calcium: 7.5 mg/dL — ABNORMAL LOW (ref 8.9–10.3)
Chloride: 102 mmol/L (ref 98–111)
Creatinine, Ser: 0.73 mg/dL (ref 0.44–1.00)
GFR, Estimated: >60 mL/min (ref >60)
Glucose, Bld: 155 mg/dL — ABNORMAL HIGH (ref 70–99)
Potassium: 4 mmol/L (ref 3.5–5.1)
Sodium: 134 mmol/L — ABNORMAL LOW (ref 135–145)

## 2020-06-02 LAB — CBC
HCT: 25.5 % — ABNORMAL LOW (ref 36.0–46.0)
Hemoglobin: 8.8 g/dL — ABNORMAL LOW (ref 12.0–15.0)
MCH: 30.8 pg (ref 26.0–34.0)
MCHC: 34.5 g/dL (ref 30.0–36.0)
MCV: 89.2 fL (ref 80.0–100.0)
Platelets: 148 10*3/uL — ABNORMAL LOW (ref 150–400)
RBC: 2.86 MIL/uL — ABNORMAL LOW (ref 3.87–5.11)
RDW: 15.4 % (ref 11.5–15.5)
WBC: 12 10*3/uL — ABNORMAL HIGH (ref 4.0–10.5)
nRBC: 0 % (ref 0.0–0.2)

## 2020-06-02 LAB — POCT ACTIVATED CLOTTING TIME: Activated Clotting Time: 243 seconds

## 2020-06-02 MED ORDER — CHLORHEXIDINE GLUCONATE CLOTH 2 % EX PADS
6.0000 | MEDICATED_PAD | Freq: Every day | CUTANEOUS | Status: DC
  Administered 2020-06-02 – 2020-06-07 (×6): 6 via TOPICAL

## 2020-06-02 MED ORDER — HEPARIN (PORCINE) 25000 UT/250ML-% IV SOLN
1300.0000 [IU]/h | INTRAVENOUS | Status: AC
  Administered 2020-06-02: 11:00:00 1100 [IU]/h via INTRAVENOUS
  Administered 2020-06-03: 07:00:00 1250 [IU]/h via INTRAVENOUS
  Administered 2020-06-04: 02:00:00 1400 [IU]/h via INTRAVENOUS
  Filled 2020-06-02 (×5): qty 250

## 2020-06-02 NOTE — Telephone Encounter (Signed)
Called and spoke with pt's daughter Efraim Kaufmann letting her know the info stated by RB and she verbalized understanding. Nothing further needed.

## 2020-06-02 NOTE — Evaluation (Signed)
Occupational Therapy Evaluation Patient Details Name: Holly Stevenson MRN: [id number] DOB: November 19, 1950 Today's Date: 06/02/2020    History of Present Illness 68 year old female who presented to postoperative visit with numbness of L LE and increased L foot pain s/p 05/10/20: right to left femoral-femoral artery bypass graft (bilateral) left femoral-popliteal artery bypass graft (left). Readmitted to hospital for exploration of right groin and control of hemorrhage, thrombectomy right to left femoral-femoral bypass graft interposition grafting right common femoral artery to fem-fem bypass thrombectomy left femoral-popliteal bypass graft and angiogram of L LE. PMH: pneumonia, HTN, high cholesterol, and DM.   Clinical Impression   Patient admitted for the above diagnosis.  Unfortunately she has returned to [facility] for additional surgery after a post op follow up.  Her biggest barriers are pain at the incision site and increased drainage from the R groin incision.  She is up and moving fairly well needing only Min Guard for mobility at a RW level.  Min A for lower body ADL due to incisional discomfort, and supervision for hygiene and grooming in stand.  Her plan is to return home with assist as needed from her daughter.  No OT post acute has been recommended.  OT to follow in the acute setting.      Follow Up Recommendations  No OT follow up    Equipment Recommendations  None recommended by OT    Recommendations for Other Services       Precautions / Restrictions Precautions Precautions: Fall Restrictions Weight Bearing Restrictions: No      Mobility Bed Mobility   Bed Mobility: Supine to Sit;Sit to Supine     Supine to sit: Supervision;HOB elevated Sit to supine: Supervision;HOB elevated        Transfers Overall transfer level: Needs assistance Equipment used: Rolling walker (2 wheeled) Transfers: Sit to/from Stand Sit to Stand: Supervision               Balance   Sitting-balance support: No upper extremity supported;Feet supported Sitting balance-Leahy Scale: Good     Standing balance support: Bilateral upper extremity supported;During functional activity Standing balance-Leahy Scale: Fair Standing balance comment: requires UE support                           ADL either performed or assessed with clinical judgement   ADL Overall ADL's : Needs assistance/impaired Eating/Feeding: Independent   Grooming: Supervision/safety;Standing;Wash/dry hands   Upper Body Bathing: Set up;Sitting   Lower Body Bathing: Supervison/ safety;Sit to/from stand   Upper Body Dressing : Set up;Sitting   Lower Body Dressing: Supervision/safety;Sit to/from Database administrator: Supervision/safety;RW;Regular Social worker and Hygiene: Supervision/safety;Sit to/from stand       Functional mobility during ADLs: Rolling walker;Supervision/safety       Vision Patient Visual Report: No change from baseline       Perception     Praxis      Pertinent Vitals/Pain Faces Pain Scale: Hurts little more Pain Location: groin L > R when moving initially Pain Descriptors / Indicators: Grimacing;Guarding Pain Intervention(s): Monitored during session     Hand Dominance Right   Extremity/Trunk Assessment Upper Extremity Assessment Upper Extremity Assessment: Overall WFL for tasks assessed   Lower Extremity Assessment Lower Extremity Assessment: Defer to PT evaluation       Communication Communication Communication: No difficulties   Cognition Arousal/Alertness: Awake/alert Behavior During Therapy: WFL for tasks assessed/performed Overall Cognitive Status:  Within Functional Limits for tasks assessed                                     General Comments   VSS on RA    Exercises     Shoulder Instructions      Home Living Family/patient expects to be discharged to:: Private  residence Living Arrangements: Alone Available Help at Discharge: Family Type of Home: House Home Access: Stairs to enter Secretary/administrator of Steps: flight Entrance Stairs-Rails: Can reach both Home Layout: Two level Alternate Level Stairs-Number of Steps: flight Alternate Level Stairs-Rails: Can reach both Bathroom Shower/Tub: Tub/shower unit;Tub only;Walk-in shower   Bathroom Toilet: Standard Bathroom Accessibility: No   Home Equipment: Walker - standard;Shower seat;Walker - 2 wheels          Prior Functioning/Environment Level of Independence: Needs assistance  Gait / Transfers Assistance Needed: had progressed to household ambulation without AD daughter present for stair mobility ADL's / Homemaking Assistance Needed: patient able to complete sit/stand sink baths independently.  Daughter assists with showers up to 3x/wk. Communication / Swallowing Assistance Needed: WFL Comments: works FT as an Immunologist, drives, care for her own meds, meals and home mangement.        OT Problem List: Impaired balance (sitting and/or standing);Pain;Decreased activity tolerance      OT Treatment/Interventions: Self-care/ADL training;DME and/or AE instruction;Patient/family education;Balance training;Therapeutic activities    OT Goals(Current goals can be found in the care plan section) Acute Rehab OT Goals Patient Stated Goal: Return home, enjoy the holidays OT Goal Formulation: With patient Time For Goal Achievement: 06/16/20 Potential to Achieve Goals: Good  OT Frequency: Min 2X/week   Barriers to D/C:    none noted       Co-evaluation              AM-PAC OT "6 Clicks" Daily Activity     Outcome Measure Help from another person eating meals?: None Help from another person taking care of personal grooming?: A Little Help from another person toileting, which includes using toliet, bedpan, or urinal?: A Little Help from another person bathing (including  washing, rinsing, drying)?: A Little Help from another person to put on and taking off regular upper body clothing?: A Little Help from another person to put on and taking off regular lower body clothing?: A Little 6 Click Score: 19   End of Session Equipment Utilized During Treatment: Rolling walker Nurse Communication: Mobility status  Activity Tolerance: Patient tolerated treatment well Patient left: in bed;with call bell/phone within reach;with family/visitor present  OT Visit Diagnosis: Unsteadiness on feet (R26.81);Other abnormalities of gait and mobility (R26.89);Pain Pain - part of body: Leg                Time: 1501-1520 OT Time Calculation (min): 19 min Charges:  OT General Charges $OT Visit: 1 Visit OT Evaluation $OT Eval Moderate Complexity: 1 Mod  06/02/2020  Rich, OTR/L  Acute Rehabilitation Services  Office:  [phone]   Suzanna Obey 06/02/2020, 3:43 PM

## 2020-06-02 NOTE — Telephone Encounter (Signed)
Spoke with the pt's daughter  She states that the pt is having complications after her bypass graft surgery  She has been admitted to the hospital and is unsure when she will be d/c  She is asking if the bronch needs to be rescheduled- it's currently scheduled for 06/06/20 Please advise, thanks!

## 2020-06-02 NOTE — Progress Notes (Signed)
ANTICOAGULATION CONSULT NOTE - Initial Consult  Pharmacy Consult for heparin  Indication: thrombosed bypass graft  No Known Allergies  Patient Measurements: Height: 5\' 8"  (172.7 cm) Weight: 81.6 kg (180 lb) IBW/kg (Calculated) : 63.9 HEPARIN DW (KG): 80.4   Vital Signs: Temp: 98.2 F (36.8 C) (12/17 2020) Temp Source: Oral (12/17 2020) BP: 144/44 (12/17 2020) Pulse Rate: 69 (12/17 2020)  Labs: Recent Labs    06/01/20 1309 06/01/20 1402 06/01/20 1504 06/01/20 1630 06/01/20 1756 06/02/20 0355 06/02/20 1837  HGB 8.3* 7.8*   < > 9.2* 9.5* 8.8*  --   HCT 26.8* 23.0*   < > 27.0* 28.3* 25.5*  --   PLT 221  --   --   --  157 148*  --   HEPARINUNFRC  --   --   --   --   --  <0.10* <0.10*  CREATININE 1.00 0.70  --   --  1.07* 0.73  --    < > = values in this interval not displayed.    Estimated Creatinine Clearance: 73.3 mL/min (by C-G formula based on SCr of 0.73 mg/dL).  Assessment: 69 yo female with thrombosed bypass graft s/p thrombectomy and also noted with evacuation of a groin hematoma.  She was started on heparin 12/16 per MD and today pharmacy was consulted to dose. Plans noted for oral anticoagulation (copay on apixaban and xarelto is $0) -Hg= 8.8, plt= 148   Goal of Therapy:  Heparin level 0.3-0.5 units/ml Monitor platelets by anticoagulation protocol: Yes   Plan:  -no heparin bolus with hematoma -increase heparin to 1250 units/hr -daily hep lvl cbc  1/17, PharmD, BCPS, BCCCP Clinical Pharmacist [phone]  Please check AMION for all [facility] Pharm[facility] numbers  06/02/2020 9:05 PM

## 2020-06-02 NOTE — Progress Notes (Signed)
2518 PT assisting patient up to the bathroom. RN called to assess pt due to bleeding from right groin. Upon assessment, right groin dressing saturated in dark red blood. PA notified. Pressure held for 20 minutes. Pt tolerated well. Brisk doppler signals in lower extremities.   1015 PA at bedside to assess and redress site. Will continue to monitor.  Hazle Nordmann, RN

## 2020-06-02 NOTE — H&P (View-Only) (Signed)
 Name: Holly Stevenson MRN: [id number] DOB: 02/18/1951    ADMISSION DATE:  06/01/2020 CONSULTATION DATE: 04/24/2020  REFERRING MD : Dr Hawken  Reason for consultation: Right upper lobe cavitary mass   HISTORY OF PRESENT ILLNESS:   69-year-old female with a known history of diabetes mellitus on oral agents, current smoker, hypertension and high cholesterol, peripheral vascular disease.  She underwent a right iliofemoral endarterectomy and bovine patch angioplasty with right to left femoral-femoral bypass and left femoral to AK popliteal bypass by Dr. Hawken on 05/10/2020.  Unfortunately she developed left lower extremity paresthesias and pain with increased incisional bleeding. She was brought back for groin exploration and femoral to femoral thrombectomy, pseudoaneurysm repair and clot removal.  This was done on 12/16. She is known to me from her previous admission and from the outpatient setting as we have evaluated a thick-walled cavitary right upper lobe mass.  She went for navigational bronchoscopy 05/23/2020 for transbronchial brushings, biopsies.  This was unfortunately nondiagnostic.  Her pulmonary function testing is marginal for surgical resection, consistent with COPD.  For that reason we have plan for repeat navigational bronchoscopy on 06/06/2020.  PAST MEDICAL HISTORY :   has a past medical history of Diabetes mellitus without complication (HCC), High cholesterol, Hypertension, and Pneumonia.  has a past surgical history that includes ABDOMINAL AORTOGRAM W/LOWER EXTREMITY (Bilateral, 04/28/2020); PERIPHERAL VASCULAR BALLOON ANGIOPLASTY (Left, 04/28/2020); Vascular surgery (04/2020); Colonoscopy w/ biopsies and polypectomy; Femoral-femoral Bypass Graft (Bilateral, 05/10/2020); Femoral-popliteal Bypass Graft (Left, 05/10/2020); Patch angioplasty (Right, 05/10/2020); Endarterectomy femoral (Right, 05/10/2020); lower extremity angiogram (Left, 05/10/2020); Video bronchoscopy (N/A,  05/23/2020); Electormagnetic navigation bronchoscopy (N/A, 05/23/2020); Bronchial brushings (05/23/2020); Bronchial biopsy (05/23/2020); Bronchial needle aspiration biopsy (05/23/2020); Bronchial washings (05/23/2020); THROMBECTOMY OF LEFT BYPASS GRAFT FEMORAL-POPLITEAL ARTERY (Left ) (06/01/2020); THROMBECTOMY FEMORAL- FEMORAL BYPASS (N/A ) (06/01/2020); Groin dissection (Right, 06/01/2020); Thrombectomy of bypass graft femoral- popliteal artery (Left, 06/01/2020); Thrombectomy femoral artery (N/A, 06/01/2020); Femoral-femoral Bypass Graft (Right, 06/01/2020); and Intraoperative arteriogram (Left, 06/01/2020). Prior to Admission medications   Medication Sig Start Date End Date Taking? Authorizing Provider  atorvastatin (LIPITOR) 20 MG tablet Take 20 mg by mouth daily. 02/11/20  Yes [provider]  benazepril (LOTENSIN) 10 MG tablet Take 10 mg by mouth daily. 02/11/20  Yes [provider]  cholecalciferol (VITAMIN D3) 25 MCG (1000 UNIT) tablet Take 1,000 Units by mouth daily.   Yes [provider]  JARDIANCE 25 MG TABS tablet Take 25 mg by mouth daily. 04/16/20  Yes [provider]  metFORMIN (GLUCOPHAGE) 500 MG tablet Take 500 mg by mouth 2 (two) times daily. 02/23/20  Yes [provider]  Multiple Vitamins-Minerals (MULTIVITAMIN WITH MINERALS) tablet Take 1 tablet by mouth daily.   Yes [provider]   No Known Allergies  FAMILY HISTORY:  family history is not on file. SOCIAL HISTORY:  reports that she has been smoking cigarettes. She has a 75.00 pack-year smoking history. She has never used smokeless tobacco. She reports previous alcohol use. She reports that she does not use drugs.  REVIEW OF SYSTEMS:   10 point review of system taken, please see HPI for positives and negatives.   SUBJECTIVE:  69-year-old female in no acute distress. VITAL SIGNS: Temp:  [97.6 F (36.4 C)-98.5 F (36.9 C)] 98.3 F (36.8 C) (12/17 1633) Pulse Rate:  [57-60]  60 (12/17 1153) Resp:  [15-20] 20 (12/17 1633) BP: (118-151)/(37-62) 121/37 (12/17 1633) SpO2:  [93 %-100 %] 99 % (12/17 1633) Arterial Line BP: (184-186)/(48-51) 184/49 (12/16   1907)  PHYSICAL EXAMINATION: General: Elderly woman, no distress, laying in bed on room air Neuro: Nonfocal Cardiovascular: Regular, no murmur Lungs: Clear bilaterally Abdomen: Nondistended with positive bowel sounds  Recent Labs  Lab 06/01/20 1309 06/01/20 1402 06/01/20 1504 06/01/20 1630 06/01/20 1756 06/02/20 0355  NA 132* 133* 134* 132*  --  134*  K 4.3 4.2 4.4 4.9  --  4.0  CL 97* 100  --   --   --  102  CO2 19*  --   --   --   --  21*  BUN 26* 27*  --   --   --  27*  CREATININE 1.00 0.70  --   --  1.07* 0.73  GLUCOSE 165* 164*  --   --   --  155*   Recent Labs  Lab 06/01/20 1309 06/01/20 1402 06/01/20 1630 06/01/20 1756 06/02/20 0355  HGB 8.3*   < > 9.2* 9.5* 8.8*  HCT 26.8*   < > 27.0* 28.3* 25.5*  WBC 15.6*  --   --  14.6* 12.0*  PLT 221  --   --  157 148*   < > = values in this interval not displayed.   DG Chest Port 1 View  Result Date: 06/01/2020 CLINICAL DATA:  69-year-old female with central line placement. EXAM: PORTABLE CHEST 1 VIEW COMPARISON:  Chest radiograph dated 05/23/2020. FINDINGS: Right IJ central venous line with tip over central SVC. No pneumothorax. Ill-defined right upper lobe cavitary lesion better seen on the prior CT. Bilateral interstitial prominence and nodularity similar to prior radiograph. No new consolidation. No pleural effusion. Stable cardiomediastinal silhouette. Atherosclerotic calcification of the aorta. No acute osseous pathology. IMPRESSION: 1. Right IJ central venous line with tip over central SVC. No pneumothorax. 2. No significant interval change in the bilateral interstitial prominence and nodularity. Electronically Signed   By: Arash  Radparvar M.D.   On: 06/01/2020 18:11   DG Ang/Ext/Uni/Or Left  Result Date: 06/01/2020 CLINICAL DATA:   69-year-old female with peripheral vascular disease EXAM: LEFT ANG/EXT/UNI/ OR; DG C-ARM 1-60 MIN COMPARISON:  None. FINDINGS: Limited intraoperative fluoroscopic spot images during lower extremity angiogram. Patency of a femoral femoral bypass graft is confirmed at the left anastomosis with patent left profunda femoris and the thigh branches. Patent left-sided femoral-popliteal bypass graft. The P2 and P3 segment are patent as well as the tibial trifurcation. IMPRESSION: Limited intraoperative fluoroscopic spot images during left lower extremity angiogram, as above. Please refer to the dictated operative report for full details of intraoperative findings and procedure. Electronically Signed   By: Jaime  Wagner D.O.   On: 06/01/2020 16:59   DG C-Arm 1-60 Min  Result Date: 06/01/2020 CLINICAL DATA:  69-year-old female with peripheral vascular disease EXAM: LEFT ANG/EXT/UNI/ OR; DG C-ARM 1-60 MIN COMPARISON:  None. FINDINGS: Limited intraoperative fluoroscopic spot images during lower extremity angiogram. Patency of a femoral femoral bypass graft is confirmed at the left anastomosis with patent left profunda femoris and the thigh branches. Patent left-sided femoral-popliteal bypass graft. The P2 and P3 segment are patent as well as the tibial trifurcation. IMPRESSION: Limited intraoperative fluoroscopic spot images during left lower extremity angiogram, as above. Please refer to the dictated operative report for full details of intraoperative findings and procedure. Electronically Signed   By: Jaime  Wagner D.O.   On: 06/01/2020 16:59   VAS US FEMORAL-FEMORAL BYPASS GRAFT  Result Date: 06/01/2020 FEM FEM BYPASS ARTERIAL DUPLEX STUDY  Vascular Interventions: Right to left Fem Fem bypass graft   05/10/20. Current ABI:            Not obtained due to add on exam Limitations: Patient wounds and bandages at groin area Performing Technologist: Jill Parker RDMS, RVT  Examination Guidelines: A complete evaluation  includes B-mode imaging, spectral Doppler, color Doppler, and power Doppler as needed of all accessible portions of each vessel. Bilateral testing is considered an integral part of a complete examination. Limited examinations for reoccurring indications may be performed as noted.  Graft #1: Fem Fem +------------------+--------+---------------+----------+-----------------------+                   PSV cm/sStenosis       Waveform  Comments                +------------------+--------+---------------+----------+-----------------------+ Inflow            394     50-74% stenosis          Hematoma surrounds                                                         right iliac artery      +------------------+--------+---------------+----------+-----------------------+ Prox Anastomosis  170                                                      +------------------+--------+---------------+----------+-----------------------+ Proximal Graft            occluded                                         +------------------+--------+---------------+----------+-----------------------+ Mid Graft                 occluded                                         +------------------+--------+---------------+----------+-----------------------+ Distal Graft              occluded                                         +------------------+--------+---------------+----------+-----------------------+ Distal Anastomosis        occluded                                         +------------------+--------+---------------+----------+-----------------------+ Outflow           60                     monophasic                        +------------------+--------+---------------+----------+-----------------------+   Summary: Occluded Fem Fem bypass graft.  See table(s) above for measurements and observations. Electronically signed by Christopher Dickson MD on 06/01/2020 at 1:07:20 PM.    Final      VAS  US LOWER EXTREMITY BYPASS GRAFT DUPL  Result Date: 06/01/2020 LOWER EXTREMITY ARTERIAL DUPLEX STUDY  Vascular Interventions: Right to left Fem Fem bypass and Left Fem Pop bypass                         05/10/20. Current ABI:            not obtained due to add on exam Performing Technologist: Jill Parker RDMS, RVT  Examination Guidelines: A complete evaluation includes B-mode imaging, spectral Doppler, color Doppler, and power Doppler as needed of all accessible portions of each vessel. Bilateral testing is considered an integral part of a complete examination. Limited examinations for reoccurring indications may be performed as noted.  Left Graft #1: +--------------------+--------+--------+----------+--------+                     PSV cm/sStenosisWaveform  Comments +--------------------+--------+--------+----------+--------+ Inflow              63              monophasic         +--------------------+--------+--------+----------+--------+ Proximal Anastomosis41              monophasic         +--------------------+--------+--------+----------+--------+ Proximal Graft      17              monophasic         +--------------------+--------+--------+----------+--------+ Mid Graft           25              monophasic         +--------------------+--------+--------+----------+--------+ Distal Graft        28              monophasic         +--------------------+--------+--------+----------+--------+ Distal Anastomosis  26              monophasic         +--------------------+--------+--------+----------+--------+ Outflow             14              monophasic         +--------------------+--------+--------+----------+--------+   Summary: Left: Severely dampened flow, but patent Fem Pop bypass.  See table(s) above for measurements and observations. Electronically signed by Christopher Dickson MD on 06/01/2020 at 1:07:03 PM.    Final     ASSESSMENT/plan: Right upper lobe  cavitary mass.  QuantiFERON gold negative.  No infectious prodrome.  Suspect malignancy but her initial cytology, pathology from navigational bronchoscopy is nondiagnostic.  Discussed with Dr. Hawken, no contraindication to navigational bronchoscopy on 12/21.  She will need anticoagulation given her thrombosis, is currently on a heparin infusion.  She can continue this up to 6 hours before the procedure, then restart several hours afterwards.  It sounds like she will remain admitted to the hospital at least until 12/21.    Ranay Ketter, MD, PhD 06/02/2020, 6:21 PM [facility] [phone] or if no answer [phone]    [facility] [phone] or if no answer [phone]

## 2020-06-02 NOTE — Progress Notes (Addendum)
[facility]  Subjective  - Feels much better.   Objective (!) 137/54 60 97.6 F (36.4 C) (Oral) 18 100%  Intake/Output Summary (Last 24 hours) at 06/02/2020 9798 Last data filed at 06/02/2020 0400 Gross per 24 hour  Intake 3219.47 ml  Output 2800 ml  Net 419.47 ml    Doppler signals DP/PT B LE Groin incisions intact with minimal drainage on dressing Right groin decreased in size with peripheral firmness Calf soft NTTP Lungs non labored breathing   Assessment/Planning: POD # 1 Emergent surgery for sever right groin hematoma PROCEDURE:   1) Exploration of right groin and control of hemorrhage 2) Thrombectomy right-to-left femoral-femoral bypass graft 3) Interposition grafting right common femoral artery to femoral-femoral bypass (77mm PTFE) 4) Thrombectomy left femoral-popliteal bypass graft 5) Angiogram left lower extremity  Patent arterial flow to B LE Heparin 800 units an hour, will likley be discharged on oral anticoagulation  Encourage mobility Dressing will be changed tomorrow HGB 8.8 stable asymptomatic currently s/p blood loss anemia received 2 units PRBC in the OR Cr WNL urine OP > 1300 Will discontinue IV fluids.  Mosetta Pigeon 06/02/2020 8:11 AM --  Laboratory Lab Results: Recent Labs    06/01/20 1756 06/02/20 0355  WBC 14.6* 12.0*  HGB 9.5* 8.8*  HCT 28.3* 25.5*  PLT 157 148*   BMET Recent Labs    06/01/20 1309 06/01/20 1402 06/01/20 1630 06/01/20 1756 06/02/20 0355  NA 132* 133* 132*  --  134*  K 4.3 4.2 4.9  --  4.0  CL 97* 100  --   --  102  CO2 19*  --   --   --  21*  GLUCOSE 165* 164*  --   --  155*  BUN 26* 27*  --   --  27*  CREATININE 1.00 0.70  --  1.07* 0.73  CALCIUM 8.5*  --   --   --  7.5*    COAG Lab Results  Component Value Date   INR 1.0 05/03/2020   INR 1.0 04/25/2020   No results found for: PTT  VASCULAR STAFF ADDENDUM: I have independently interviewed and examined  the patient. I agree with the above.  Looks great this AM. Reviewed operative findings in detail with patient and daughter.  +DS in LLE DP. Dressings C/D/I. Transition to titrating heparin infusion per pharmacy today.  If no bleeding, will transition to Eliquis 5mg  PO BID tomorrow. Remove central line, foley catheter. PT / OT / OOB / Ambulate  . Rande Brunt, MD [facility] Phone Number: [phone] 06/02/2020 8:36 AM

## 2020-06-02 NOTE — Evaluation (Signed)
Physical Therapy Evaluation Patient Details Name: Holly Stevenson MRN: [id number] DOB: 1950/09/20 Today's Date: 06/02/2020   History of Present Illness  69 year old female who presented to postoperative visit with numbness of L LE and increased L foot pain s/p 05/10/20: right to left femoral-femoral artery bypass graft (bilateral) left femoral-popliteal artery bypass graft (left). Readmitted to hospital for exploration of right groin and control of hemorrhage, thrombectomy right to left femoral-femoral bypass graft interposition grafting right common femoral artery to fem-fem bypass thrombectomy left femoral-popliteal bypass graft and angiogram of L LE. PMH: pneumonia, HTN, high cholesterol, and DM.  Clinical Impression  PTA pt recovering alone in her multistory home with flight of steps to enter. Pt reports that she had returned to ambulation without AD in her home, pt independent in ADLs, daughter assists with iADLs. Pt was limited in safe mobility today with R groin incision bleeding and increased pain. Pt required min A for bed mobility, transfers and ambulation with RW. PT currently recommending no additional PT services or equipment at discharge, however will continue to see acutely to progress mobility.    Follow Up Recommendations No PT follow up    Equipment Recommendations  None recommended by PT       Precautions / Restrictions Precautions Precautions: Fall Restrictions Weight Bearing Restrictions: No      Mobility  Bed Mobility Overal bed mobility: Needs Assistance Bed Mobility: Supine to Sit;Sit to Supine     Supine to sit: Min assist Sit to supine: Min assist   General bed mobility comments: pt able to manage LE off bed and bring trunk to upright min A for scooting hips forward to EoB, slight dizziness which dissipated quickly, min A for management of LE back into bed    Transfers Overall transfer level: Needs assistance Equipment used: Rolling walker (2  wheeled) Transfers: Sit to/from Stand Sit to Stand: Min guard         General transfer comment: min guard for safety, good power up and steadying  Ambulation/Gait Ambulation/Gait assistance: Min guard;Min assist Gait Distance (Feet): 10 Feet (x2) Assistive device: Rolling walker (2 wheeled) Gait Pattern/deviations: Step-through pattern;Decreased step length - right;Decreased step length - left;Trunk flexed;Shuffle Gait velocity: decreased Gait velocity interpretation: <1.31 ft/sec, indicative of household ambulator General Gait Details: min guard for slow, shuffling gait to bathroom, noted a few drops of blood, R groin noted to be bleeding and increased with sitting on toilet, min A for ambulation back to bed, bleeding increased      Balance Overall balance assessment: Needs assistance Sitting-balance support: No upper extremity supported;Feet supported Sitting balance-Leahy Scale: Good     Standing balance support: Bilateral upper extremity supported;No upper extremity supported;During functional activity Standing balance-Leahy Scale: Fair Standing balance comment: requires UE support                             Pertinent Vitals/Pain Pain Assessment: 0-10 Pain Score: 7  Pain Location: groin L > R Pain Descriptors / Indicators: Grimacing;Guarding Pain Intervention(s): Limited activity within patient's tolerance;Monitored during session;Repositioned    Home Living Family/patient expects to be discharged to:: Private residence Living Arrangements: Alone Available Help at Discharge: Family Type of Home: House Home Access: Stairs to enter Entrance Stairs-Rails: Can reach both Entrance Stairs-Number of Steps: flight Home Layout: Two level Home Equipment: Environmental consultant - standard      Prior Function Level of Independence: Needs assistance   Gait / Transfers Assistance  Needed: had progressed to household ambulation without AD daughter present for stair  mobility  ADL's / Homemaking Assistance Needed: daughter assisting with iADLs           Extremity/Trunk Assessment   Upper Extremity Assessment Upper Extremity Assessment: Defer to OT evaluation    Lower Extremity Assessment Lower Extremity Assessment: RLE deficits/detail;LLE deficits/detail;Generalized weakness RLE Deficits / Details: hip ROM limited by pain LLE Deficits / Details: hip ROM limited by pain       Communication   Communication: No difficulties  Cognition Arousal/Alertness: Awake/alert Behavior During Therapy: WFL for tasks assessed/performed Overall Cognitive Status: Within Functional Limits for tasks assessed                                        General Comments General comments (skin integrity, edema, etc.): R groin incision bleeding with ambulation to bathroom, pt returned to supine and pressure held on incision approx 4 min until RN arrived, VSS on RA        Assessment/Plan    PT Assessment Patient needs continued PT services  PT Problem List Decreased strength;Decreased range of motion;Decreased activity tolerance;Decreased balance;Decreased mobility;Cardiopulmonary status limiting activity;Decreased skin integrity;Pain       PT Treatment Interventions DME instruction;Gait training;Stair training;Functional mobility training;Therapeutic activities;Therapeutic exercise;Balance training;Neuromuscular re-education    PT Goals (Current goals can be found in the Care Plan section)  Acute Rehab PT Goals Patient Stated Goal: go home PT Goal Formulation: With patient Time For Goal Achievement: 05/25/20 Potential to Achieve Goals: Good    Frequency Min 3X/week   Barriers to discharge Decreased caregiver support         AM-PAC PT "6 Clicks" Mobility  Outcome Measure Help needed turning from your back to your side while in a flat bed without using bedrails?: None Help needed moving from lying on your back to sitting on the side  of a flat bed without using bedrails?: A Little Help needed moving to and from a bed to a chair (including a wheelchair)?: A Little Help needed standing up from a chair using your arms (e.g., wheelchair or bedside chair)?: A Little Help needed to walk in hospital room?: A Little Help needed climbing 3-5 steps with a railing? : A Lot 6 Click Score: 18    End of Session Equipment Utilized During Treatment: Gait belt Activity Tolerance: Treatment limited secondary to medical complications (Comment) (R groin bleeding) Patient left: in bed;with call bell/phone within reach;with nursing/sitter in room Nurse Communication: Mobility status;Other (comment) (R groin incision bleeding) PT Visit Diagnosis: Unsteadiness on feet (R26.81);Other abnormalities of gait and mobility (R26.89);Muscle weakness (generalized) (M62.81);Difficulty in walking, not elsewhere classified (R26.2);Pain Pain - Right/Left:  (bilateral groin incisions) Pain - part of body:  (groin)    Time: 0623-7628 PT Time Calculation (min) (ACUTE ONLY): 28 min   Charges:   PT Evaluation $PT Eval Moderate Complexity: 1 Mod PT Treatments $Therapeutic Activity: 8-22 mins        Lofton Leon B. Beverely Risen PT, DPT Acute Rehabilitation Services Pager [phone] Office [phone]   Elon Alas Fleet 06/02/2020, 9:55 AM

## 2020-06-02 NOTE — Progress Notes (Addendum)
ANTICOAGULATION CONSULT NOTE - Initial Consult  Pharmacy Consult for heparin  Indication: thrombosed bypass graft  No Known Allergies  Patient Measurements: Height: 5\' 8"  (172.7 cm) Weight: 81.6 kg (180 lb) IBW/kg (Calculated) : 63.9 HEPARIN DW (KG): 80.4   Vital Signs: Temp: 98.5 F (36.9 C) (12/17 0800) Temp Source: Oral (12/17 0800) BP: 131/62 (12/17 0800) Pulse Rate: 60 (12/17 0328)  Labs: Recent Labs    06/01/20 1309 06/01/20 1402 06/01/20 1630 06/01/20 1756 06/02/20 0355  HGB 8.3* 7.8* 9.2* 9.5* 8.8*  HCT 26.8* 23.0* 27.0* 28.3* 25.5*  PLT 221  --   --  157 148*  HEPARINUNFRC  --   --   --   --  <0.10*  CREATININE 1.00 0.70  --  1.07* 0.73    Estimated Creatinine Clearance: 73.3 mL/min (by C-G formula based on SCr of 0.73 mg/dL).   Medical History: Past Medical History:  Diagnosis Date  . Diabetes mellitus without complication (HCC)   . High cholesterol   . Hypertension   . Pneumonia     Medications:  Medications Prior to Admission  Medication Sig Dispense Refill Last Dose  . acetaminophen (TYLENOL) 500 MG tablet Take 1,500 mg by mouth 3 (three) times daily as needed for moderate pain or headache.   05/31/2020 at Unknown time  . aspirin EC 81 MG EC tablet Take 1 tablet (81 mg total) by mouth daily at 6 (six) AM. Swallow whole. 30 tablet 11 05/31/2020 at Unknown time  . atorvastatin (LIPITOR) 40 MG tablet Take 1 tablet (40 mg total) by mouth daily.   05/31/2020 at Unknown time  . benazepril (LOTENSIN) 10 MG tablet Take 10 mg by mouth daily.   05/31/2020 at Unknown time  . cholecalciferol (VITAMIN D3) 25 MCG (1000 UNIT) tablet Take 1,000 Units by mouth daily.   05/31/2020 at Unknown time  . JARDIANCE 25 MG TABS tablet Take 25 mg by mouth daily.   05/31/2020 at Unknown time  . metFORMIN (GLUCOPHAGE) 500 MG tablet Take 1,000 mg by mouth 2 (two) times daily.    05/31/2020 at Unknown time  . Multiple Vitamins-Minerals (MULTIVITAMIN WITH MINERALS) tablet Take  1 tablet by mouth daily.   05/31/2020 at Unknown time  . oxyCODONE (OXY IR/ROXICODONE) 5 MG immediate release tablet Take 1 tablet (5 mg total) by mouth every 6 (six) hours as needed for moderate pain. 30 tablet 0 05/31/2020 at Unknown time  . feeding supplement, GLUCERNA SHAKE, (GLUCERNA SHAKE) LIQD Take 237 mLs by mouth 3 (three) times daily between meals. 237 mL 0 More than a month at Unknown time  . Tiotropium Bromide Monohydrate (SPIRIVA RESPIMAT) 1.25 MCG/ACT AERS Inhale 2 puffs into the lungs daily. 4 g 0    Scheduled:  . aspirin EC  81 mg Oral Q0600  . atorvastatin  40 mg Oral Q2000  . benazepril  10 mg Oral Daily  . Chlorhexidine Gluconate Cloth  6 each Topical Daily  . cholecalciferol  1,000 Units Oral Daily  . docusate sodium  100 mg Oral Daily  . empagliflozin  25 mg Oral Daily  . feeding supplement (GLUCERNA SHAKE)  237 mL Oral TID BM  . metFORMIN  1,000 mg Oral BID WC  . pantoprazole  40 mg Oral Daily  . umeclidinium bromide  1 puff Inhalation Daily    Assessment: 69 yo female with thrombosed bypass graft s/p thrombectomy and also noted with evacuation of a groin hematoma.  She was started on heparin 12/16 per MD and today  pharmacy was consulted to dose. Plans noted for oral anticoagulation (copay on apixaban and xarelto is $0) -Hg= 8.8, plt= 148   Goal of Therapy:  Heparin level 0.3-0.5 units/ml Monitor platelets by anticoagulation protocol: Yes   Plan:  -no heparin bolus with hematoma -increase heparin to 1100 units/hr -Heparin level in 9 hours and daily wth CBC daily  Harland German, PharmD Clinical Pharmacist **Pharmacist phone directory can now be found on [email or web address] ([email or web address]).  Listed under [facility].

## 2020-06-02 NOTE — Progress Notes (Signed)
Patient given ginger ale and water as well as saltine crackers. She tolerated well with no complaints of nausea. Diet advanced to heart healthy per MD order. Brynda Rim, RN

## 2020-06-02 NOTE — Telephone Encounter (Signed)
Let her know that I'll go see her in the hospital to check on her. We'll figure out whether we need to postpone based on how she's doing

## 2020-06-02 NOTE — Consult Note (Signed)
Name: Holly Stevenson MRN: [id number] DOB: 12/04/1950    ADMISSION DATE:  06/01/2020 CONSULTATION DATE: 04/24/2020  REFERRING MD : Dr Lenell AntuHawken  Reason for consultation: Right upper lobe cavitary mass   HISTORY OF PRESENT ILLNESS:   69 year old female with a known history of diabetes mellitus on oral agents, current smoker, hypertension and high cholesterol, peripheral vascular disease.  She underwent a right iliofemoral endarterectomy and bovine patch angioplasty with right to left femoral-femoral bypass and left femoral to AK popliteal bypass by Dr. Lenell AntuHawken on 05/10/2020.  Unfortunately she developed left lower extremity paresthesias and pain with increased incisional bleeding. She was brought back for groin exploration and femoral to femoral thrombectomy, pseudoaneurysm repair and clot removal.  This was done on 12/16. She is known to me from her previous admission and from the outpatient setting as we have evaluated a thick-walled cavitary right upper lobe mass.  She went for navigational bronchoscopy 05/23/2020 for transbronchial brushings, biopsies.  This was unfortunately nondiagnostic.  Her pulmonary function testing is marginal for surgical resection, consistent with COPD.  For that reason we have plan for repeat navigational bronchoscopy on 06/06/2020.  PAST MEDICAL HISTORY :   has a past medical history of Diabetes mellitus without complication (HCC), High cholesterol, Hypertension, and Pneumonia.  has a past surgical history that includes ABDOMINAL AORTOGRAM W/LOWER EXTREMITY (Bilateral, 04/28/2020); PERIPHERAL VASCULAR BALLOON ANGIOPLASTY (Left, 04/28/2020); Vascular surgery (04/2020); Colonoscopy w/ biopsies and polypectomy; Femoral-femoral Bypass Graft (Bilateral, 05/10/2020); Femoral-popliteal Bypass Graft (Left, 05/10/2020); Patch angioplasty (Right, 05/10/2020); Endarterectomy femoral (Right, 05/10/2020); lower extremity angiogram (Left, 05/10/2020); Video bronchoscopy (N/A,  05/23/2020); Electormagnetic navigation bronchoscopy (N/A, 05/23/2020); Bronchial brushings (05/23/2020); Bronchial biopsy (05/23/2020); Bronchial needle aspiration biopsy (05/23/2020); Bronchial washings (05/23/2020); THROMBECTOMY OF LEFT BYPASS GRAFT FEMORAL-POPLITEAL ARTERY (Left ) (06/01/2020); THROMBECTOMY FEMORAL- FEMORAL BYPASS (N/A ) (06/01/2020); Groin dissection (Right, 06/01/2020); Thrombectomy of bypass graft femoral- popliteal artery (Left, 06/01/2020); Thrombectomy femoral artery (N/A, 06/01/2020); Femoral-femoral Bypass Graft (Right, 06/01/2020); and Intraoperative arteriogram (Left, 06/01/2020). Prior to Admission medications   Medication Sig Start Date End Date Taking? Authorizing Provider  atorvastatin (LIPITOR) 20 MG tablet Take 20 mg by mouth daily. 02/11/20  Yes [provider]  benazepril (LOTENSIN) 10 MG tablet Take 10 mg by mouth daily. 02/11/20  Yes [provider]  cholecalciferol (VITAMIN D3) 25 MCG (1000 UNIT) tablet Take 1,000 Units by mouth daily.   Yes [provider]  JARDIANCE 25 MG TABS tablet Take 25 mg by mouth daily. 04/16/20  Yes [provider]  metFORMIN (GLUCOPHAGE) 500 MG tablet Take 500 mg by mouth 2 (two) times daily. 02/23/20  Yes [provider]  Multiple Vitamins-Minerals (MULTIVITAMIN WITH MINERALS) tablet Take 1 tablet by mouth daily.   Yes [provider]   No Known Allergies  FAMILY HISTORY:  family history is not on file. SOCIAL HISTORY:  reports that she has been smoking cigarettes. She has a 75.00 pack-year smoking history. She has never used smokeless tobacco. She reports previous alcohol use. She reports that she does not use drugs.  REVIEW OF SYSTEMS:   10 point review of system taken, please see HPI for positives and negatives.   SUBJECTIVE:  69 year old female in no acute distress. VITAL SIGNS: Temp:  [97.6 F (36.4 C)-98.5 F (36.9 C)] 98.3 F (36.8 C) (12/17 1633) Pulse Rate:  [57-60]  60 (12/17 1153) Resp:  [15-20] 20 (12/17 1633) BP: (118-151)/(37-62) 121/37 (12/17 1633) SpO2:  [93 %-100 %] 99 % (12/17 1633) Arterial Line BP: (184-186)/(48-51) 184/49 (12/16  1907)  PHYSICAL EXAMINATION: General: Elderly woman, no distress, laying in bed on room air Neuro: Nonfocal Cardiovascular: Regular, no murmur Lungs: Clear bilaterally Abdomen: Nondistended with positive bowel sounds  Recent Labs  Lab 06/01/20 1309 06/01/20 1402 06/01/20 1504 06/01/20 1630 06/01/20 1756 06/02/20 0355  NA 132* 133* 134* 132*  --  134*  K 4.3 4.2 4.4 4.9  --  4.0  CL 97* 100  --   --   --  102  CO2 19*  --   --   --   --  21*  BUN 26* 27*  --   --   --  27*  CREATININE 1.00 0.70  --   --  1.07* 0.73  GLUCOSE 165* 164*  --   --   --  155*   Recent Labs  Lab 06/01/20 1309 06/01/20 1402 06/01/20 1630 06/01/20 1756 06/02/20 0355  HGB 8.3*   < > 9.2* 9.5* 8.8*  HCT 26.8*   < > 27.0* 28.3* 25.5*  WBC 15.6*  --   --  14.6* 12.0*  PLT 221  --   --  157 148*   < > = values in this interval not displayed.   DG Chest Port 1 View  Result Date: 06/01/2020 CLINICAL DATA:  69 year old female with central line placement. EXAM: PORTABLE CHEST 1 VIEW COMPARISON:  Chest radiograph dated 05/23/2020. FINDINGS: Right IJ central venous line with tip over central SVC. No pneumothorax. Ill-defined right upper lobe cavitary lesion better seen on the prior CT. Bilateral interstitial prominence and nodularity similar to prior radiograph. No new consolidation. No pleural effusion. Stable cardiomediastinal silhouette. Atherosclerotic calcification of the aorta. No acute osseous pathology. IMPRESSION: 1. Right IJ central venous line with tip over central SVC. No pneumothorax. 2. No significant interval change in the bilateral interstitial prominence and nodularity. Electronically Signed   By: Elgie Collard M.D.   On: 06/01/2020 18:11   DG Ang/Ext/Uni/Or Left  Result Date: 06/01/2020 CLINICAL DATA:   69 year old female with peripheral vascular disease EXAM: LEFT ANG/EXT/UNI/ OR; DG C-ARM 1-60 MIN COMPARISON:  None. FINDINGS: Limited intraoperative fluoroscopic spot images during lower extremity angiogram. Patency of a femoral femoral bypass graft is confirmed at the left anastomosis with patent left profunda femoris and the thigh branches. Patent left-sided femoral-popliteal bypass graft. The P2 and P3 segment are patent as well as the tibial trifurcation. IMPRESSION: Limited intraoperative fluoroscopic spot images during left lower extremity angiogram, as above. Please refer to the dictated operative report for full details of intraoperative findings and procedure. Electronically Signed   By: Gilmer Mor D.O.   On: 06/01/2020 16:59   DG C-Arm 1-60 Min  Result Date: 06/01/2020 CLINICAL DATA:  69 year old female with peripheral vascular disease EXAM: LEFT ANG/EXT/UNI/ OR; DG C-ARM 1-60 MIN COMPARISON:  None. FINDINGS: Limited intraoperative fluoroscopic spot images during lower extremity angiogram. Patency of a femoral femoral bypass graft is confirmed at the left anastomosis with patent left profunda femoris and the thigh branches. Patent left-sided femoral-popliteal bypass graft. The P2 and P3 segment are patent as well as the tibial trifurcation. IMPRESSION: Limited intraoperative fluoroscopic spot images during left lower extremity angiogram, as above. Please refer to the dictated operative report for full details of intraoperative findings and procedure. Electronically Signed   By: Gilmer Mor D.O.   On: 06/01/2020 16:59   VAS [place] FEMORAL-FEMORAL BYPASS GRAFT  Result Date: 06/01/2020 FEM FEM BYPASS ARTERIAL DUPLEX STUDY  Vascular Interventions: Right to left Fem Fem bypass graft  05/10/20. Current ABI:            Not obtained due to add on exam Limitations: Patient wounds and bandages at groin area Performing Technologist: Jeb Levering RDMS, RVT  Examination Guidelines: A complete evaluation  includes B-mode imaging, spectral Doppler, color Doppler, and power Doppler as needed of all accessible portions of each vessel. Bilateral testing is considered an integral part of a complete examination. Limited examinations for reoccurring indications may be performed as noted.  Graft #1: Fem Fem +------------------+--------+---------------+----------+-----------------------+                   PSV cm/sStenosis       Waveform  Comments                +------------------+--------+---------------+----------+-----------------------+ Inflow            394     50-74% stenosis          Hematoma surrounds                                                         right iliac artery      +------------------+--------+---------------+----------+-----------------------+ Prox Anastomosis  170                                                      +------------------+--------+---------------+----------+-----------------------+ Proximal Graft            occluded                                         +------------------+--------+---------------+----------+-----------------------+ Mid Graft                 occluded                                         +------------------+--------+---------------+----------+-----------------------+ Distal Graft              occluded                                         +------------------+--------+---------------+----------+-----------------------+ Distal Anastomosis        occluded                                         +------------------+--------+---------------+----------+-----------------------+ Outflow           60                     monophasic                        +------------------+--------+---------------+----------+-----------------------+   Summary: Occluded Fem Fem bypass graft.  See table(s) above for measurements and observations. Electronically signed by Waverly Ferrari MD on 06/01/2020 at 1:07:20 PM.    Final  VAS  [place] LOWER EXTREMITY BYPASS GRAFT DUPL  Result Date: 06/01/2020 LOWER EXTREMITY ARTERIAL DUPLEX STUDY  Vascular Interventions: Right to left Fem Fem bypass and Left Fem Pop bypass                         05/10/20. Current ABI:            not obtained due to add on exam Performing Technologist: Jeb Levering RDMS, RVT  Examination Guidelines: A complete evaluation includes B-mode imaging, spectral Doppler, color Doppler, and power Doppler as needed of all accessible portions of each vessel. Bilateral testing is considered an integral part of a complete examination. Limited examinations for reoccurring indications may be performed as noted.  Left Graft #1: +--------------------+--------+--------+----------+--------+                     PSV cm/sStenosisWaveform  Comments +--------------------+--------+--------+----------+--------+ Inflow              63              monophasic         +--------------------+--------+--------+----------+--------+ Proximal Anastomosis41              monophasic         +--------------------+--------+--------+----------+--------+ Proximal Graft      17              monophasic         +--------------------+--------+--------+----------+--------+ Mid Graft           25              monophasic         +--------------------+--------+--------+----------+--------+ Distal Graft        28              monophasic         +--------------------+--------+--------+----------+--------+ Distal Anastomosis  26              monophasic         +--------------------+--------+--------+----------+--------+ Outflow             14              monophasic         +--------------------+--------+--------+----------+--------+   Summary: Left: Severely dampened flow, but patent Fem Pop bypass.  See table(s) above for measurements and observations. Electronically signed by Waverly Ferrari MD on 06/01/2020 at 1:07:03 PM.    Final     ASSESSMENT/plan: Right upper lobe  cavitary mass.  QuantiFERON gold negative.  No infectious prodrome.  Suspect malignancy but her initial cytology, pathology from navigational bronchoscopy is nondiagnostic.  Discussed with Dr. Lenell Antu, no contraindication to navigational bronchoscopy on 12/21.  She will need anticoagulation given her thrombosis, is currently on a heparin infusion.  She can continue this up to 6 hours before the procedure, then restart several hours afterwards.  It sounds like she will remain admitted to the hospital at least until 12/21.    Levy Pupa, MD, PhD 06/02/2020, 6:21 PM [facility] [phone] or if no answer [phone]

## 2020-06-02 NOTE — Progress Notes (Signed)
Pt. Arrived to unit from OR just prior to shift change s/p right to left fem-fem bypass and left fem-po bypass. Bilateral groin dressings in place. No drainage, no hematomas noted. Pt is currently sleeping with no s/s of acute distress.  VS stable. NS started at 75cc/hr. Heparin drip as 800 units an hour infusing per orders. Orders reviewed and acknowledged.

## 2020-06-02 NOTE — Progress Notes (Signed)
Mobility Specialist - Progress Note   06/02/20 1457  Mobility  Activity Ambulated to bathroom  Level of Assistance Standby assist, set-up cues, supervision of patient - no hands on  Assistive Device Front wheel walker  Distance Ambulated (ft) 15 ft  Mobility Response Tolerated well  Mobility performed by Mobility specialist  $Mobility charge 1 Mobility   Some bleeding from groin when pt stood from bed to ambulate to bathroom. RN was present. OT and RN present with pt once she was seated on the toilet and further ambulation was differed. VSS.  Mamie Levers Mobility Specialist Mobility Specialist Phone: [phone]

## 2020-06-03 ENCOUNTER — Inpatient Hospital Stay (HOSPITAL_COMMUNITY): Admission: RE | Admit: 2020-06-03 | Payer: BC Managed Care – PPO | Source: Ambulatory Visit

## 2020-06-03 LAB — CBC
HCT: 23.9 % — ABNORMAL LOW (ref 36.0–46.0)
Hemoglobin: 8 g/dL — ABNORMAL LOW (ref 12.0–15.0)
MCH: 30.7 pg (ref 26.0–34.0)
MCHC: 33.5 g/dL (ref 30.0–36.0)
MCV: 91.6 fL (ref 80.0–100.0)
Platelets: 148 10*3/uL — ABNORMAL LOW (ref 150–400)
RBC: 2.61 MIL/uL — ABNORMAL LOW (ref 3.87–5.11)
RDW: 15.8 % — ABNORMAL HIGH (ref 11.5–15.5)
WBC: 8.2 10*3/uL (ref 4.0–10.5)
nRBC: 0 % (ref 0.0–0.2)

## 2020-06-03 LAB — HEPARIN LEVEL (UNFRACTIONATED)
Heparin Unfractionated: 0.12 IU/mL — ABNORMAL LOW (ref 0.30–0.70)
Heparin Unfractionated: 0.34 IU/mL (ref 0.30–0.70)

## 2020-06-03 NOTE — Progress Notes (Signed)
Mobility Specialist - Progress Note   06/03/20 1043  Mobility  Activity Ambulated in hall  Level of Assistance Standby assist, set-up cues, supervision of patient - no hands on  Assistive Device Front wheel walker  Distance Ambulated (ft) 160 ft  Mobility Response Tolerated well  Mobility performed by Mobility specialist  $Mobility charge 1 Mobility    Pre-mobility: 77 HR, 97% SpO2 During mobility: 89 HR Post-mobility: 68 HR, 96% SPO2  Pt ambulated to bathroom then out into hall. Asx throughout. To recliner after walk.   Mamie Levers Mobility Specialist Mobility Specialist Phone: [phone]

## 2020-06-03 NOTE — Progress Notes (Signed)
ANTICOAGULATION CONSULT NOTE  Pharmacy Consult for heparin  Indication: thrombosed bypass graft  No Known Allergies  Patient Measurements: Height: 5\' 8"  (172.7 cm) Weight: 81.6 kg (180 lb) IBW/kg (Calculated) : 63.9 HEPARIN DW (KG): 80.4   Vital Signs: Temp: 99.6 F (37.6 C) (12/18 1317) Temp Source: Oral (12/18 1317) BP: 140/41 (12/18 1317) Pulse Rate: 71 (12/18 1317)  Labs: Recent Labs    06/01/20 1402 06/01/20 1504 06/01/20 1756 06/01/20 1756 06/02/20 0355 06/02/20 1837 06/03/20 0535 06/03/20 1336  HGB 7.8*   < > 9.5*  --  8.8*  --  8.0*  --   HCT 23.0*   < > 28.3*  --  25.5*  --  23.9*  --   PLT  --   --  157  --  148*  --  148*  --   HEPARINUNFRC  --   --   --    < > <0.10* <0.10* 0.12* 0.34  CREATININE 0.70  --  1.07*  --  0.73  --   --   --    < > = values in this interval not displayed.    Estimated Creatinine Clearance: 73.3 mL/min (by C-G formula based on SCr of 0.73 mg/dL).  Assessment: 69 yo female with thrombosed bypass graft s/p thrombectomy for heparin  H/H trending down, PLT stable, HL theraputic  Goal of Therapy:  Heparin level 0.3-0.5 units/ml Monitor platelets by anticoagulation protocol: Yes   Plan:  Continue Heparin 1400 units/hr Daily heparin level, CBC.    66, PharmD PGY1 Pharmacy Resident 06/03/2020 2:10 PM

## 2020-06-03 NOTE — Progress Notes (Signed)
Patient with right groin surgical incision. Dressing has been changed twice today with abdominal pads moderately soaked. No frank blood or hematoma formation. Will continue to monitor.   -Elissa Lovett, RN

## 2020-06-03 NOTE — Progress Notes (Signed)
ANTICOAGULATION CONSULT NOTE  Pharmacy Consult for heparin  Indication: thrombosed bypass graft  No Known Allergies  Patient Measurements: Height: 5\' 8"  (172.7 cm) Weight: 81.6 kg (180 lb) IBW/kg (Calculated) : 63.9 HEPARIN DW (KG): 80.4   Vital Signs: Temp: 98.5 F (36.9 C) (12/18 0354) Temp Source: Oral (12/18 0354) BP: 126/40 (12/18 0354) Pulse Rate: 67 (12/18 0354)  Labs: Recent Labs    06/01/20 1402 06/01/20 1504 06/01/20 1756 06/02/20 0355 06/02/20 1837 06/03/20 0535  HGB 7.8*   < > 9.5* 8.8*  --  8.0*  HCT 23.0*   < > 28.3* 25.5*  --  23.9*  PLT  --   --  157 148*  --  148*  HEPARINUNFRC  --   --   --  <0.10* <0.10* 0.12*  CREATININE 0.70  --  1.07* 0.73  --   --    < > = values in this interval not displayed.    Estimated Creatinine Clearance: 73.3 mL/min (by C-G formula based on SCr of 0.73 mg/dL).  Assessment: 69 yo female with thrombosed bypass graft s/p thrombectomy for heparin  Goal of Therapy:  Heparin level 0.3-0.5 units/ml Monitor platelets by anticoagulation protocol: Yes   Plan:  Increase Heparin 1400 units/hr Check heparin level in 8 hours.   66, PharmD, BCPS  06/03/2020 6:36 AM

## 2020-06-03 NOTE — Progress Notes (Addendum)
  Progress Note    06/03/2020 9:14 AM 2 Days Post-Op  Subjective:  No complaints this morning. Sitting up in chair   Vitals:   06/03/20 0819 06/03/20 0846  BP:  (!) 175/56  Pulse:  100  Resp:  18  Temp:  99.9 F (37.7 C)  SpO2: 97% 94%   Physical Exam: Cardiac: regular rate and rhythm Lungs: non labored Incisions: groin incisions with clean dressings. Soft around without any signs of active bleeding Extremities:  Well perfused and warm with brisk Doppler PT/ DP signals bilaterally Neurologic: alert and oriented  CBC    Component Value Date/Time   WBC 8.2 06/03/2020 0535   RBC 2.61 (L) 06/03/2020 0535   HGB 8.0 (L) 06/03/2020 0535   HCT 23.9 (L) 06/03/2020 0535   PLT 148 (L) 06/03/2020 0535   MCV 91.6 06/03/2020 0535   MCH 30.7 06/03/2020 0535   MCHC 33.5 06/03/2020 0535   RDW 15.8 (H) 06/03/2020 0535   LYMPHSABS 1.3 04/25/2020 0345   MONOABS 1.1 (H) 04/25/2020 0345   EOSABS 0.1 04/25/2020 0345   BASOSABS 0.0 04/25/2020 0345    BMET    Component Value Date/Time   NA 134 (L) 06/02/2020 0355   K 4.0 06/02/2020 0355   CL 102 06/02/2020 0355   CO2 21 (L) 06/02/2020 0355   GLUCOSE 155 (H) 06/02/2020 0355   BUN 27 (H) 06/02/2020 0355   CREATININE 0.73 06/02/2020 0355   CALCIUM 7.5 (L) 06/02/2020 0355   GFRNONAA >60 06/02/2020 0355   GFRAA 52 (L) 02/18/2018 1610    INR    Component Value Date/Time   INR 1.0 05/03/2020 1530     Intake/Output Summary (Last 24 hours) at 06/03/2020 0914 Last data filed at 06/03/2020 6314 Gross per 24 hour  Intake 231.87 ml  Output --  Net 231.87 ml     Assessment/Plan:  69 y.o. female is s/p Emergent surgery for severe right groin hematoma 1) Exploration of right groin and control of hemorrhage 2) Thrombectomy right-to-left femoral-femoral bypass graft 3) Interposition grafting right common femoral artery to femoral-femoral bypass (32mm PTFE) 4) Thrombectomy left femoral-popliteal bypass graft 5) Angiogram left  lower extremity 2 Days Post-Op. Doing well post op. Bilateral lower extremities are well perfused and warm with Brisk doppler signals. Hemodynamically stable hgb 8 this morning. Will need to continue to monitor. Repeat CBC in morning.  She remains on heparin IV. She is schedule for Bronchoscopy on 12/21 with Dr. Delton Coombes. She will continue heparin up until 6 hours prior to procedure. Will resume afterwards. She will be transitioned to Eliquis 5 mg BID on discharge. Encourage oob and mobilization today  DVT prophylaxis:  Heparin gtt   Graceann Congress, PA-C [facility] [phone] 06/03/2020 9:14 AM   I have seen and evaluated the patient. I agree with the PA note as documented above.  69 year old female status post thrombectomy of her right left femorofemoral with revision of the right groin anastomosis and also thrombectomy of her left femoropopliteal bypass.  This morning she has brisk Doppler signals in both feet.  Continue IV heparin.  Hemoglobin 8.8 --> 8.0.  Ultimately she is scheduled for bronchoscopy with Dr.Byrum early in the week and then the plan is to transition to Eliquis on discharge.  We will keep her on heparin through the weekend given pending procedure.  Overall looks good.  Cephus Shelling, MD [facility] Office: [phone]

## 2020-06-04 LAB — CBC
HCT: 24.8 % — ABNORMAL LOW (ref 36.0–46.0)
Hemoglobin: 8.6 g/dL — ABNORMAL LOW (ref 12.0–15.0)
MCH: 31.7 pg (ref 26.0–34.0)
MCHC: 34.7 g/dL (ref 30.0–36.0)
MCV: 91.5 fL (ref 80.0–100.0)
Platelets: 153 10*3/uL (ref 150–400)
RBC: 2.71 MIL/uL — ABNORMAL LOW (ref 3.87–5.11)
RDW: 15.5 % (ref 11.5–15.5)
WBC: 12.2 10*3/uL — ABNORMAL HIGH (ref 4.0–10.5)
nRBC: 0 % (ref 0.0–0.2)

## 2020-06-04 LAB — HEPARIN LEVEL (UNFRACTIONATED): Heparin Unfractionated: 0.39 IU/mL (ref 0.30–0.70)

## 2020-06-04 NOTE — Progress Notes (Signed)
Mobility Specialist - Progress Note   06/04/20 1158  Mobility  Activity Ambulated in hall  Level of Assistance Standby assist, set-up cues, supervision of patient - no hands on  Assistive Device Front wheel walker  Distance Ambulated (ft) 260 ft  Mobility Response Tolerated well  Mobility performed by Mobility specialist  $Mobility charge 1 Mobility   Pre-mobility: 66 HR During mobility: 84 HR Post-mobility: 65 HR  Asx throughout. To recliner after walk.   Mamie Levers Mobility Specialist Mobility Specialist Phone: [phone]

## 2020-06-04 NOTE — Progress Notes (Addendum)
Progress Note    06/04/2020 8:03 AM 3 Days Post-Op  Subjective:  Overall says  Pain is well controlled. No major complaints this morning. States overnight she had moderate amount of drainage from her wounds. Dressings were changed   Vitals:   06/04/20 0028 06/04/20 0400  BP: (!) 135/44 (!) 167/35  Pulse: 64 66  Resp: 17   Temp: 98.4 F (36.9 C) 98.8 F (37.1 C)  SpO2: 97%    Physical Exam: Cardiac:  regular Lungs: non labored Incisions: groin incisions dressed. Left dressing is dry. There does appear to be serous drainage from right groin. Dressing is not saturated. No bleeding. There is some more edema and firmness of the right groin and suprapubic area overlying the fem- fem bypass. No erythema Extremities:  Bilateral lower extremities are well perfused and warm. Brisk doppler DP/ PT signals bilaterally Abdomen:  Soft, non tender, non distended Neurologic: alert and oriented  CBC    Component Value Date/Time   WBC 12.2 (H) 06/04/2020 0053   RBC 2.71 (L) 06/04/2020 0053   HGB 8.6 (L) 06/04/2020 0053   HCT 24.8 (L) 06/04/2020 0053   PLT 153 06/04/2020 0053   MCV 91.5 06/04/2020 0053   MCH 31.7 06/04/2020 0053   MCHC 34.7 06/04/2020 0053   RDW 15.5 06/04/2020 0053   LYMPHSABS 1.3 04/25/2020 0345   MONOABS 1.1 (H) 04/25/2020 0345   EOSABS 0.1 04/25/2020 0345   BASOSABS 0.0 04/25/2020 0345    BMET    Component Value Date/Time   NA 134 (L) 06/02/2020 0355   K 4.0 06/02/2020 0355   CL 102 06/02/2020 0355   CO2 21 (L) 06/02/2020 0355   GLUCOSE 155 (H) 06/02/2020 0355   BUN 27 (H) 06/02/2020 0355   CREATININE 0.73 06/02/2020 0355   CALCIUM 7.5 (L) 06/02/2020 0355   GFRNONAA >60 06/02/2020 0355   GFRAA 52 (L) 02/18/2018 1610    INR    Component Value Date/Time   INR 1.0 05/03/2020 1530    No intake or output data in the 24 hours ending 06/04/20 0803   Assessment/Plan:  69 y.o. female is status post thrombectomy of her right left femorofemoral with  revision of the right groin anastomosis and also thrombectomy of her left femoropopliteal bypass 3 Days Post-Op.Her lower extremities are well perfused and warm with brisk PT/ Dp signals bilaterally. She does have moderate serous drainage from right greater than left groin. Continue Dressing changes BID. Hemodynamically stable. Hgb is 8.6 this morning. She is on IV heparin. Pending bronchoscopy by Dr. Delton Coombes 12/21. She will be transitioned to Eliquis prior to d/c. Mobilize as tolerated  DVT prophylaxis:  Heparin gtt   Graceann Congress, PA-C [facility] [phone] 06/04/2020 8:03 AM   I have seen and evaluated the patient. I agree with the PA note as documented above.  70 year old female status post washout of right groin hematoma with revision of right groin anastomosis of femorofemoral bypass after thrombectomy of the femorofemoral bypass and also thrombectomy of the left leg femoropopliteal.  Overall she is doing good.  She has very brisk Doppler signals in bilateral DP PT.  She has a lot of fullness in the right groin with a lot of edema that extends into the pubis and upper abdomen.  Hemoglobin has been stable and I do not think she has a hematoma.  She is on heparin given high risk for graft failure.  This may just be a seroma and we will watch this.  She is having serous drainage from the groin.  Cephus Shelling, MD [facility] [place] Office: [phone]

## 2020-06-04 NOTE — Progress Notes (Signed)
ANTICOAGULATION CONSULT NOTE  Pharmacy Consult for heparin  Indication: thrombosed bypass graft  No Known Allergies  Patient Measurements: Height: 5\' 8"  (172.7 cm) Weight: 81.6 kg (180 lb) IBW/kg (Calculated) : 63.9 HEPARIN DW (KG): 80.4   Vital Signs: Temp: 98.8 F (37.1 C) (12/19 0400) Temp Source: Oral (12/19 0400) BP: 167/35 (12/19 0400) Pulse Rate: 66 (12/19 0400)  Labs: Recent Labs    06/01/20 1402 06/01/20 1504 06/01/20 1756 06/02/20 0355 06/02/20 1837 06/03/20 0535 06/03/20 1336 06/04/20 0053  HGB 7.8*   < > 9.5* 8.8*  --  8.0*  --  8.6*  HCT 23.0*   < > 28.3* 25.5*  --  23.9*  --  24.8*  PLT  --   --  157 148*  --  148*  --  153  HEPARINUNFRC  --   --   --  <0.10*   < > 0.12* 0.34 0.39  CREATININE 0.70  --  1.07* 0.73  --   --   --   --    < > = values in this interval not displayed.    Estimated Creatinine Clearance: 73.3 mL/min (by C-G formula based on SCr of 0.73 mg/dL).  Assessment: 69 yo female with thrombosed bypass graft s/p thrombectomy for heparin  H/H stable, PLT stable, HL therapeutic. No signs of bleeding noted  Goal of Therapy:  Heparin level 0.3-0.5 units/ml Monitor platelets by anticoagulation protocol: Yes   Plan:  Continue Heparin 1400 units/hr Daily heparin level, CBC.    65, PharmD PGY1 Pharmacy Resident 06/04/2020 8:00 AM

## 2020-06-04 NOTE — Progress Notes (Signed)
Occupational Therapy Treatment and Discharge Patient Details Name: Petra Dumler MRN: [id number] DOB: 1951-01-23 Today's Date: 06/04/2020    History of present illness 69 year old female who presented to postoperative visit with numbness of L LE and increased L foot pain s/p 05/10/20: right to left femoral-femoral artery bypass graft (bilateral) left femoral-popliteal artery bypass graft (left). Readmitted to hospital for exploration of right groin and control of hemorrhage, thrombectomy right to left femoral-femoral bypass graft interposition grafting right common femoral artery to fem-fem bypass thrombectomy left femoral-popliteal bypass graft and angiogram of L LE. PMH: pneumonia, HTN, high cholesterol, and DM.   OT comments  Pt met goals. Pt ambulatory in room with supervisionA (for IV Pole management) to modified independence. Pt ambulation in room ~40'; pt stood at sink x4 mins for ADL; transferred to commode for toilet hygiene and stood for pericare with supervisionA to modified independence. Pt did not require physical assist today for any tasks. Pain  Minimal in R more than L today- reported as soreness. Pt aware of energy conservation techniques and has family support. Pt does not require continued OT skilled services. OT signing off.    Follow Up Recommendations  No OT follow up    Equipment Recommendations  None recommended by OT    Recommendations for Other Services      Precautions / Restrictions Precautions Precautions: Fall Restrictions Weight Bearing Restrictions: No       Mobility Bed Mobility               General bed mobility comments: in chair pre and post session  Transfers Overall transfer level: Modified independent   Transfers: Sit to/from Stand;Stand Pivot Transfers Sit to Stand: Modified independent (Device/Increase time) Stand pivot transfers: Modified independent (Device/Increase time)       General transfer comment: modified  independence; assist for IV pole only    Balance Overall balance assessment: Needs assistance   Sitting balance-Leahy Scale: Good     Standing balance support: Bilateral upper extremity supported;During functional activity Standing balance-Leahy Scale: Fair Standing balance comment: could perform ADL tasks without UE support at commode and sink                           ADL either performed or assessed with clinical judgement   ADL Overall ADL's : Needs assistance/impaired     Grooming: Supervision/safety;Standing;Wash/dry hands;Wash/dry face;Oral care;Brushing hair Grooming Details (indicate cue type and reason): no set-upA or physical assist; stood x5 mins                 Toilet Transfer: Supervision/safety;Cueing for Insurance underwriter Details (indicate cue type and reason): supervisionA for safety Toileting- Clothing Manipulation and Hygiene: Supervision/safety;Sit to/from stand Toileting - Clothing Manipulation Details (indicate cue type and reason): no physical assist required Tub/ Shower Transfer: Modified independent;Walk-in shower;Ambulation Tub/Shower Transfer Details (indicate cue type and reason): simulation using RW for stall shower at home Functional mobility during ADLs: Rolling walker;Supervision/safety General ADL Comments: pt ambulation in room from recliner by window to bathroom and back; stooda t sink x4 mins for ADL; transferred to commode for toilet hygiene and stood for pericare with supervisionA. Pt did not require physical assist.     Vision Baseline Vision/History: No visual deficits Vision Assessment?: No apparent visual deficits   Perception     Praxis      Cognition Arousal/Alertness: Awake/alert Behavior During Therapy: WFL for tasks assessed/performed Overall Cognitive Status: Within Functional  Limits for tasks assessed                                          Exercises      Shoulder Instructions       General Comments VSS on RA.    Pertinent Vitals/ Pain       Pain Assessment: 0-10 Pain Score: 4  Pain Location: groin R worse than L when moving initially Pain Descriptors / Indicators: Sore Pain Intervention(s): Monitored during session;Repositioned  Home Living Family/patient expects to be discharged to:: Private residence Living Arrangements: Alone                                      Prior Functioning/Environment Level of Independence: Needs assistance  Gait / Transfers Assistance Needed: had progressed to household ambulation without AD daughter present for stair mobility ADL's / Homemaking Assistance Needed: patient able to complete sit/stand sink baths independently.  Daughter assists with showers up to 3x/wk. Communication / Swallowing Assistance Needed: [facility]     Frequency           Progress Toward Goals  OT Goals(current goals can now be found in the care plan section)     Acute Rehab OT Goals Patient Stated Goal: Return home, enjoy the holidays OT Goal Formulation: All assessment and education complete, DC therapy Potential to Achieve Goals: Good  Plan      Co-evaluation                 AM-PAC OT "6 Clicks" Daily Activity     Outcome Measure   Help from another person eating meals?: None Help from another person taking care of personal grooming?: None Help from another person toileting, which includes using toliet, bedpan, or urinal?: None Help from another person bathing (including washing, rinsing, drying)?: A Little Help from another person to put on and taking off regular upper body clothing?: None Help from another person to put on and taking off regular lower body clothing?: None 6 Click Score: 23    End of Session Equipment Utilized During Treatment: Rolling walker  OT Visit Diagnosis: Unsteadiness on feet (R26.81);Pain Pain - Right/Left: Right Pain - part of body: Leg   Activity  Tolerance Patient tolerated treatment well   Patient Left in chair;with call bell/phone within reach;with family/visitor present   Nurse Communication Mobility status        Time: 1415-1445 OT Time Calculation (min): 30 min  Charges: OT General Charges $OT Visit: 1 Visit OT Treatments $Self Care/Home Management : 8-22 mins $Therapeutic Activity: 8-22 mins  Jefferey Pica, OTR/L Acute Rehabilitation Services Pager: [phone] Office: [phone]    Wonda Goodgame C 06/04/2020, 3:18 PM

## 2020-06-05 LAB — BASIC METABOLIC PANEL
Anion gap: 9 (ref 5–15)
BUN: 19 mg/dL (ref 8–23)
CO2: 23 mmol/L (ref 22–32)
Calcium: 7.5 mg/dL — ABNORMAL LOW (ref 8.9–10.3)
Chloride: 101 mmol/L (ref 98–111)
Creatinine, Ser: 0.73 mg/dL (ref 0.44–1.00)
GFR, Estimated: >60 mL/min (ref >60)
Glucose, Bld: 177 mg/dL — ABNORMAL HIGH (ref 70–99)
Potassium: 4.1 mmol/L (ref 3.5–5.1)
Sodium: 133 mmol/L — ABNORMAL LOW (ref 135–145)

## 2020-06-05 LAB — TYPE AND SCREEN
ABO/RH(D): O NEG
Antibody Screen: NEGATIVE
Unit division: 0
Unit division: 0
Unit division: 0
Unit division: 0

## 2020-06-05 LAB — CBC
HCT: 24.5 % — ABNORMAL LOW (ref 36.0–46.0)
Hemoglobin: 8.1 g/dL — ABNORMAL LOW (ref 12.0–15.0)
MCH: 30.6 pg (ref 26.0–34.0)
MCHC: 33.1 g/dL (ref 30.0–36.0)
MCV: 92.5 fL (ref 80.0–100.0)
Platelets: 190 10*3/uL (ref 150–400)
RBC: 2.65 MIL/uL — ABNORMAL LOW (ref 3.87–5.11)
RDW: 15.1 % (ref 11.5–15.5)
WBC: 10.7 10*3/uL — ABNORMAL HIGH (ref 4.0–10.5)
nRBC: 0 % (ref 0.0–0.2)

## 2020-06-05 LAB — BPAM RBC
Blood Product Expiration Date: 202112242359
Blood Product Expiration Date: 202201022359
Blood Product Expiration Date: 202201112359
Blood Product Expiration Date: 202201112359
ISSUE DATE / TIME: 202112161450
ISSUE DATE / TIME: 202112161450
Unit Type and Rh: 9500
Unit Type and Rh: 9500
Unit Type and Rh: 9500
Unit Type and Rh: 9500

## 2020-06-05 LAB — HEPARIN LEVEL (UNFRACTIONATED): Heparin Unfractionated: 0.58 IU/mL (ref 0.30–0.70)

## 2020-06-05 MED ORDER — BISACODYL 10 MG RE SUPP
10.0000 mg | Freq: Every day | RECTAL | Status: DC | PRN
  Administered 2020-06-05 (×2): 10 mg via RECTAL
  Filled 2020-06-05 (×2): qty 1

## 2020-06-05 MED ORDER — MAGNESIUM CITRATE PO SOLN
0.5000 | Freq: Once | ORAL | Status: AC
  Administered 2020-06-05: 18:00:00 0.5 via ORAL
  Filled 2020-06-05: qty 296

## 2020-06-05 MED FILL — Heparin Sodium (Porcine) Inj 1000 Unit/ML: INTRAMUSCULAR | Qty: 30 | Status: AC

## 2020-06-05 MED FILL — Sodium Chloride IV Soln 0.9%: INTRAVENOUS | Qty: 1000 | Status: AC

## 2020-06-05 NOTE — Progress Notes (Signed)
Mobility Specialist: Progress Note   06/05/20 1337  Mobility  Activity Ambulated in hall  Level of Assistance Modified independent, requires aide device or extra time  Assistive Device Front wheel walker  Distance Ambulated (ft) 300 ft  Mobility Response Tolerated well  Mobility performed by Mobility specialist  Bed Position Chair  $Mobility charge 1 Mobility   Pre-Mobility: 67 HR Post-Mobility:            Sitting: 77 HR, 185/43 BP, 100% SpO2           Sitting 1 minute: 167/41 BP  Pt c/o 6/10 pain in her R groin at the beginning of ambulation which she said eased up during ambulation. Pt back to chair per request.   Cristal Deer Skylar Flynt Mobility Specialist

## 2020-06-05 NOTE — Anesthesia Preprocedure Evaluation (Addendum)
Anesthesia Evaluation  Patient identified by MRN, date of birth, ID band Patient awake    Reviewed: Allergy & Precautions, H&P , NPO status , Patient's Chart, lab work & pertinent test results  Airway Mallampati: I  TM Distance: >3 FB Neck ROM: Full    Dental no notable dental hx. (+) Edentulous Upper, Partial Lower, Dental Advisory Given   Pulmonary COPD, Current Smoker and Patient abstained from smoking.,    Pulmonary exam normal breath sounds clear to auscultation       Cardiovascular Exercise Tolerance: Good hypertension, Pt. on medications + Peripheral Vascular Disease   Rhythm:Regular Rate:Normal     Neuro/Psych negative neurological ROS  negative psych ROS   GI/Hepatic negative GI ROS, Neg liver ROS,   Endo/Other  diabetes, Type 2, Oral Hypoglycemic Agents  Renal/GU negative Renal ROS  negative genitourinary   Musculoskeletal  (+) Arthritis , Osteoarthritis,    Abdominal   Peds  Hematology negative hematology ROS (+)   Anesthesia Other Findings   Reproductive/Obstetrics negative OB ROS                            Anesthesia Physical Anesthesia Plan  ASA: II  Anesthesia Plan: General   Post-op Pain Management:    Induction: Intravenous  PONV Risk Score and Plan: 3 and Ondansetron, Dexamethasone and Treatment may vary due to age or medical condition  Airway Management Planned: Oral ETT  Additional Equipment:   Intra-op Plan:   Post-operative Plan: Extubation in OR  Informed Consent: I have reviewed the patients History and Physical, chart, labs and discussed the procedure including the risks, benefits and alternatives for the proposed anesthesia with the patient or authorized representative who has indicated his/her understanding and acceptance.     Dental advisory given  Plan Discussed with: CRNA  Anesthesia Plan Comments:        Anesthesia Quick  Evaluation

## 2020-06-05 NOTE — Progress Notes (Signed)
Physical Therapy Treatment Patient Details Name: Holly Stevenson MRN: [id number] DOB: February 10, 1951 Today's Date: 06/05/2020    History of Present Illness 69 year old female who presented to postoperative visit with numbness of L LE and increased L foot pain s/p 05/10/20: right to left femoral-femoral artery bypass graft (bilateral) left femoral-popliteal artery bypass graft (left). Readmitted to hospital for exploration of right groin and control of hemorrhage, thrombectomy right to left femoral-femoral bypass graft interposition grafting right common femoral artery to fem-fem bypass thrombectomy left femoral-popliteal bypass graft and angiogram of L LE. PMH: pneumonia, HTN, high cholesterol, and DM.    PT Comments    Patient progressing well towards PT goals. Continues to report pain in right groin incision worsened with sitting/standing tasks. Improved ambulation distance with min guard progressing to supervision for safety. Tolerated stair training with min guard assist and cues for safety. Limited due to IV pole. Encouraged walking while in the hospital to improve strength/mobility. Will follow.    Follow Up Recommendations  No PT follow up     Equipment Recommendations  None recommended by PT    Recommendations for Other Services       Precautions / Restrictions Precautions Precautions: Fall Restrictions Weight Bearing Restrictions: No    Mobility  Bed Mobility Overal bed mobility: Needs Assistance Bed Mobility: Supine to Sit     Supine to sit: Supervision;HOB elevated     General bed mobility comments: No assist needed, use of rail.  Transfers Overall transfer level: Needs assistance Equipment used: Rolling walker (2 wheeled) Transfers: Sit to/from Stand Sit to Stand: Supervision         General transfer comment: Supervision for safety, Stood from EOB x1, transferred to chair post ambulation. Slow to rise due to pain in right  groin.  Ambulation/Gait Ambulation/Gait assistance: Min Emergency planning/management officer (Feet): 250 Feet Assistive device: Rolling walker (2 wheeled) Gait Pattern/deviations: Step-through pattern;Decreased step length - right;Decreased step length - left;Trunk flexed Gait velocity: decreased Gait velocity interpretation: 1.31 - 2.62 ft/sec, indicative of limited community ambulator General Gait Details: Slow, mostly steady gait with a few standing rest breaks due to 2/4 DOE. VSS.   Stairs Stairs: Yes Stairs assistance: Min guard Stair Management: One rail Left;Backwards;Forwards Number of Stairs: 4 General stair comments: Cues for sequencing and safety.  Forwards to ascend and backwards to descend. Limited due to IV.   Wheelchair Mobility    Modified Rankin (Stroke Patients Only)       Balance Overall balance assessment: Needs assistance Sitting-balance support: No upper extremity supported;Feet supported Sitting balance-Leahy Scale: Good     Standing balance support: During functional activity;Bilateral upper extremity supported Standing balance-Leahy Scale: Fair                              Cognition Arousal/Alertness: Awake/alert Behavior During Therapy: WFL for tasks assessed/performed Overall Cognitive Status: Within Functional Limits for tasks assessed                                        Exercises      General Comments General comments (skin integrity, edema, etc.): VSS on RA.      Pertinent Vitals/Pain Pain Assessment: Faces Faces Pain Scale: Hurts even more Pain Location: right groin around incision Pain Descriptors / Indicators: Sore;Operative site guarding Pain Intervention(s): Monitored during session;Repositioned;Patient requesting pain  meds-RN notified    Home Living                      Prior Function            PT Goals (current goals can now be found in the care plan section) Progress towards PT  goals: Progressing toward goals    Frequency    Min 3X/week      PT Plan Current plan remains appropriate    Co-evaluation              AM-PAC PT "6 Clicks" Mobility   Outcome Measure  Help needed turning from your back to your side while in a flat bed without using bedrails?: None Help needed moving from lying on your back to sitting on the side of a flat bed without using bedrails?: None Help needed moving to and from a bed to a chair (including a wheelchair)?: A Little Help needed standing up from a chair using your arms (e.g., wheelchair or bedside chair)?: A Little Help needed to walk in hospital room?: A Little Help needed climbing 3-5 steps with a railing? : A Little 6 Click Score: 20    End of Session Equipment Utilized During Treatment: Gait belt Activity Tolerance: Patient tolerated treatment well Patient left: in chair;with call bell/phone within reach Nurse Communication: Mobility status PT Visit Diagnosis: Unsteadiness on feet (R26.81);Other abnormalities of gait and mobility (R26.89);Muscle weakness (generalized) (M62.81);Difficulty in walking, not elsewhere classified (R26.2);Pain Pain - Right/Left: Right Pain - part of body:  (groin)     Time: 7078-6754 PT Time Calculation (min) (ACUTE ONLY): 20 min  Charges:  $Gait Training: 8-22 mins                     ,Vale Haven, PT, DPT Acute Rehabilitation Services Pager [phone] Office [phone]       Blake Divine A Lanier Ensign 06/05/2020, 10:19 AM

## 2020-06-05 NOTE — Progress Notes (Signed)
Mobility Specialist: Progress Note   06/05/20 1444  Mobility  Activity Ambulated in hall  Level of Assistance Modified independent, requires aide device or extra time  Assistive Device Front wheel walker  Distance Ambulated (ft) 300 ft  Mobility Response Tolerated well  Mobility performed by Mobility specialist  Bed Position Chair  $Mobility charge 1 Mobility   Pre-Mobility: 70 HR Post-Mobility: 87 HR  Pt was asx during ambulation.   [facility] Holly Stevenson Mobility Specialist

## 2020-06-05 NOTE — Progress Notes (Addendum)
  Progress Note    06/05/2020 7:51 AM 4 Days Post-Op  Subjective:  No complaints   Vitals:   06/04/20 2059 06/05/20 0009  BP: (!) 146/45 140/62  Pulse: 68   Resp:  20  Temp: 99 F (37.2 C) 98.4 F (36.9 C)  SpO2: 96% 96%   Physical Exam: Cardiac:  regular Lungs: non labored Incisions: bilateral groin incisions intact. Left looking very good. Dry. No swelling or hematoma. Right incision intact with maceration. Serous drainage. Surrounding fullness extending from right groin into lower abdomen and suprapubic region. No erythema. Extremities:  Well perfused and warm with brisk doppler PT/ Dp signals. Right foot edematous Abdomen: edema of right lower quadrant. Mildy tender, no erythema Neurologic: alert and oriented  CBC    Component Value Date/Time   WBC 12.2 (H) 06/04/2020 0053   RBC 2.71 (L) 06/04/2020 0053   HGB 8.6 (L) 06/04/2020 0053   HCT 24.8 (L) 06/04/2020 0053   PLT 153 06/04/2020 0053   MCV 91.5 06/04/2020 0053   MCH 31.7 06/04/2020 0053   MCHC 34.7 06/04/2020 0053   RDW 15.5 06/04/2020 0053   LYMPHSABS 1.3 04/25/2020 0345   MONOABS 1.1 (H) 04/25/2020 0345   EOSABS 0.1 04/25/2020 0345   BASOSABS 0.0 04/25/2020 0345    BMET    Component Value Date/Time   NA 134 (L) 06/02/2020 0355   K 4.0 06/02/2020 0355   CL 102 06/02/2020 0355   CO2 21 (L) 06/02/2020 0355   GLUCOSE 155 (H) 06/02/2020 0355   BUN 27 (H) 06/02/2020 0355   CREATININE 0.73 06/02/2020 0355   CALCIUM 7.5 (L) 06/02/2020 0355   GFRNONAA >60 06/02/2020 0355   GFRAA 52 (L) 02/18/2018 1610    INR    Component Value Date/Time   INR 1.0 05/03/2020 1530     Intake/Output Summary (Last 24 hours) at 06/05/2020 0751 Last data filed at 06/04/2020 1300 Gross per 24 hour  Intake 480 ml  Output --  Net 480 ml     Assessment/Plan:  69 y.o. female is s/p status post thrombectomy of her right left femorofemoral with revision of the right groin anastomosis and also thrombectomy of her left  femoropopliteal bypass 4 Days Post-Op. Bilateral lower extremities well perfused with brisk doppler PT/ DP bilaterally. Continued fullness of right groin and lower abdomen with edema. Does not appear to be infected. WBC up to 12.2 but morning labs pending. Will need to monitor and observe for possible infection. Remains on Heparin IV. Hgb was stable yesterday. Encourage oob and mobilization as tolerated  DVT prophylaxis:  Heparin IV   Graceann Congress, PA-C [facility] [phone] 06/05/2020 7:51 AM    Addendum: Chester Holstein wound VAC to suction in right groin. Hopefully this will help with the moderate amount of serous output from her right groin incision  I have seen and evaluated the patient. I agree with the PA note as documented above.  Continues to have brisk Doppler signals in bilateral lower extremities.  Tolerating heparin with relatively stable hemoglobin.  Plan is ENB with pulmonology tomorrow.  NPO after midnight.  She is having significant serous drainage from the right groin.  Will place Prevena wound VAC in the right groin.  Cephus Shelling, MD [facility] [place] Office: [phone]

## 2020-06-05 NOTE — Progress Notes (Signed)
ANTICOAGULATION CONSULT NOTE  Pharmacy Consult for heparin  Indication: thrombosed bypass graft , s/p thrombectomy on 06/01/20  No Known Allergies  Patient Measurements: Height: 5\' 8"  (172.7 cm) Weight: 81.6 kg (180 lb) IBW/kg (Calculated) : 63.9 HEPARIN DW (KG): 80.4   Vital Signs: Temp: 99 F (37.2 C) (12/20 0811) Temp Source: Oral (12/20 0811) BP: 149/49 (12/20 0909) Pulse Rate: 70 (12/20 0909)  Labs: Recent Labs    06/03/20 0535 06/03/20 1336 06/04/20 0053 06/05/20 0618  HGB 8.0*  --  8.6*  --   HCT 23.9*  --  24.8*  --   PLT 148*  --  153  --   HEPARINUNFRC 0.12* 0.34 0.39 0.58    Estimated Creatinine Clearance: 73.3 mL/min (by C-G formula based on SCr of 0.73 mg/dL).  Assessment: 69 yo female with thrombosed bypass graft s/p thrombectomy on 06/01/20. Pharmacy consulted to dose/monitor IV heparin infusion.  H/H stable, PLTC stable,  CBC pending today. Heparin level today is 0.58 on heparin rate 1400 units/hr.   HL above our lower therapeutic goal range 0.3-0.5 . No signs of bleeding noted. VVS reports No swelling or hematoma noted.  Goal of Therapy:  Heparin level 0.3-0.5 units/ml Monitor platelets by anticoagulation protocol: Yes   Plan:  Decrease Heparin to 1300 units/hr Daily heparin level, CBC.    Thank you for allowing pharmacy to be part of this patients care team. 06/03/20, RPh Clinical Pharmacist [phone] Please check AMION for all [facility] Pharmacy phone numbers After 10:00 PM, call Main Pharmacy [phone] 06/05/2020 9:41 AM

## 2020-06-05 NOTE — Progress Notes (Addendum)
PCCM continues to follow for RUL cavitary mass. Chart reviewed.  NPO after MN in place for planned ENB by Dr. Delton Coombes on 12/21 at 0730. Pt remains on RA.  Heparin infusion to continue up to 6 hours before procedure.  Orders in place with instructions to stop infusion at 0130 for procedure.  Please call PCCM if new questions arise in the interim.      Canary Brim, MSN, NP-C, AGACNP-BC [facility] 06/05/2020, 8:49 AM   Please see [email or web address] for pager details.

## 2020-06-05 NOTE — Progress Notes (Signed)
ANTICOAGULATION CONSULT NOTE  Pharmacy Consult for heparin  Indication: thrombosed bypass graft , s/p thrombectomy on 06/01/20  No Known Allergies  Patient Measurements: Height: 5\' 8"  (172.7 cm) Weight: 81.6 kg (180 lb) IBW/kg (Calculated) : 63.9 HEPARIN DW (KG): 80.4   Vital Signs: Temp: 99 F (37.2 C) (12/20 0811) Temp Source: Oral (12/20 0811) BP: 149/49 (12/20 0909) Pulse Rate: 70 (12/20 0909)  Labs: Recent Labs    06/03/20 0535 06/03/20 1336 06/04/20 0053 06/05/20 0618  HGB 8.0*  --  8.6*  --   HCT 23.9*  --  24.8*  --   PLT 148*  --  153  --   HEPARINUNFRC 0.12* 0.34 0.39 0.58    Estimated Creatinine Clearance: 73.3 mL/min (by C-G formula based on SCr of 0.73 mg/dL).  Assessment: 69 yo female with thrombosed bypass graft s/p thrombectomy on 06/01/20. Pharmacy consulted to dose/monitor IV heparin infusion.  H/H stable, PLTC stable,  CBC pending today. Heparin level today is 0.58 on heparin rate 1400 units/hr.   HL above our lower therapeutic goal range 0.3-0.5 . No signs of bleeding noted. VVS reports No swelling or hematoma noted.  Goal of Therapy:  Heparin level 0.3-0.5 units/ml Monitor platelets by anticoagulation protocol: Yes   Plan:  Decrease Heparin to 1300 units/hr Daily heparin level, CBC.  Heparin to stop/hold @  0130 06/06/20 for  ENB procedure 12/21. Will follow up post procedure for anticoagulation plan.   Thank you for allowing pharmacy to be part of this patients care team. 1/22, RPh Clinical Pharmacist [phone] Please check AMION for all [facility] Pharmacy phone numbers After 10:00 PM, call Main Pharmacy [phone] 06/05/2020 9:45 AM

## 2020-06-06 ENCOUNTER — Inpatient Hospital Stay (HOSPITAL_COMMUNITY): Payer: BC Managed Care – PPO | Admitting: Certified Registered"

## 2020-06-06 ENCOUNTER — Inpatient Hospital Stay (HOSPITAL_COMMUNITY): Admit: 2020-06-06 | Payer: BC Managed Care – PPO | Source: Home / Self Care | Admitting: Emergency Medicine

## 2020-06-06 ENCOUNTER — Inpatient Hospital Stay (HOSPITAL_COMMUNITY): Payer: BC Managed Care – PPO

## 2020-06-06 ENCOUNTER — Ambulatory Visit (HOSPITAL_COMMUNITY): Payer: BC Managed Care – PPO

## 2020-06-06 ENCOUNTER — Encounter (HOSPITAL_COMMUNITY): Payer: Self-pay | Admitting: Vascular Surgery

## 2020-06-06 ENCOUNTER — Encounter (HOSPITAL_COMMUNITY)
Admission: RE | Disposition: A | Discharge status: Home / Self Care | Payer: Self-pay | Source: Home / Self Care | Attending: Vascular Surgery

## 2020-06-06 DIAGNOSIS — J984 Other disorders of lung: Secondary | ICD-10-CM

## 2020-06-06 HISTORY — PX: BRONCHIAL BIOPSY: SHX5109

## 2020-06-06 HISTORY — PX: VIDEO BRONCHOSCOPY WITH ENDOBRONCHIAL NAVIGATION: SHX6175

## 2020-06-06 HISTORY — PX: BRONCHIAL WASHINGS: SHX5105

## 2020-06-06 HISTORY — PX: BRONCHIAL BRUSHINGS: SHX5108

## 2020-06-06 HISTORY — PX: BRONCHIAL NEEDLE ASPIRATION BIOPSY: SHX5106

## 2020-06-06 LAB — HEPARIN LEVEL (UNFRACTIONATED)
Heparin Unfractionated: <0.1 IU/mL — ABNORMAL LOW (ref 0.30–0.70)
Heparin Unfractionated: 0.18 IU/mL — ABNORMAL LOW (ref 0.30–0.70)

## 2020-06-06 LAB — CBC
HCT: 24.2 % — ABNORMAL LOW (ref 36.0–46.0)
Hemoglobin: 7.5 g/dL — ABNORMAL LOW (ref 12.0–15.0)
MCH: 29.2 pg (ref 26.0–34.0)
MCHC: 31 g/dL (ref 30.0–36.0)
MCV: 94.2 fL (ref 80.0–100.0)
Platelets: 185 10*3/uL (ref 150–400)
RBC: 2.57 MIL/uL — ABNORMAL LOW (ref 3.87–5.11)
RDW: 14.9 % (ref 11.5–15.5)
WBC: 9.6 10*3/uL (ref 4.0–10.5)
nRBC: 0 % (ref 0.0–0.2)

## 2020-06-06 LAB — GLUCOSE, CAPILLARY: Glucose-Capillary: 147 mg/dL — ABNORMAL HIGH (ref 70–99)

## 2020-06-06 SURGERY — VIDEO BRONCHOSCOPY WITH ENDOBRONCHIAL NAVIGATION
Anesthesia: General | Laterality: Right

## 2020-06-06 MED ORDER — CALCIUM CHLORIDE 10 % IV SOLN
INTRAVENOUS | Status: DC | PRN
  Administered 2020-06-06: 150 mg via INTRAVENOUS
  Administered 2020-06-06: 200 mg via INTRAVENOUS
  Administered 2020-06-06: 150 mg via INTRAVENOUS
  Administered 2020-06-06: 100 mg via INTRAVENOUS

## 2020-06-06 MED ORDER — HEPARIN (PORCINE) 25000 UT/250ML-% IV SOLN
1500.0000 [IU]/h | INTRAVENOUS | Status: DC
  Administered 2020-06-06: 12:00:00 1300 [IU]/h via INTRAVENOUS
  Administered 2020-06-07: 06:00:00 1500 [IU]/h via INTRAVENOUS
  Filled 2020-06-06 (×2): qty 250

## 2020-06-06 MED ORDER — FENTANYL CITRATE (PF) 100 MCG/2ML IJ SOLN
INTRAMUSCULAR | Status: DC | PRN
  Administered 2020-06-06 (×2): 50 ug via INTRAVENOUS

## 2020-06-06 MED ORDER — ROCURONIUM BROMIDE 100 MG/10ML IV SOLN
INTRAVENOUS | Status: DC | PRN
  Administered 2020-06-06: 40 mg via INTRAVENOUS
  Administered 2020-06-06: 10 mg via INTRAVENOUS

## 2020-06-06 MED ORDER — FENTANYL CITRATE (PF) 100 MCG/2ML IJ SOLN: 25.0000 ug | INTRAMUSCULAR | Status: DC | PRN

## 2020-06-06 MED ORDER — ACETAMINOPHEN 500 MG PO TABS
1000.0000 mg | ORAL_TABLET | Freq: Once | ORAL | Status: AC
  Administered 2020-06-06: 07:00:00 1000 mg via ORAL
  Filled 2020-06-06: qty 2

## 2020-06-06 MED ORDER — ONDANSETRON HCL 4 MG/2ML IJ SOLN
INTRAMUSCULAR | Status: DC | PRN
  Administered 2020-06-06: 4 mg via INTRAVENOUS

## 2020-06-06 MED ORDER — CELECOXIB 200 MG PO CAPS
200.0000 mg | ORAL_CAPSULE | Freq: Once | ORAL | Status: AC
  Administered 2020-06-06: 07:00:00 200 mg via ORAL
  Filled 2020-06-06: qty 1

## 2020-06-06 MED ORDER — LACTATED RINGERS IV SOLN: INTRAVENOUS | Status: DC | PRN

## 2020-06-06 MED ORDER — PROPOFOL 10 MG/ML IV BOLUS
INTRAVENOUS | Status: DC | PRN
  Administered 2020-06-06: 120 mg via INTRAVENOUS
  Administered 2020-06-06: 40 mg via INTRAVENOUS

## 2020-06-06 MED ORDER — ALBUMIN HUMAN 5 % IV SOLN: INTRAVENOUS | Status: DC | PRN

## 2020-06-06 MED ORDER — SUGAMMADEX SODIUM 200 MG/2ML IV SOLN
INTRAVENOUS | Status: DC | PRN
  Administered 2020-06-06: 150 mg via INTRAVENOUS

## 2020-06-06 MED ORDER — EPHEDRINE SULFATE-NACL 50-0.9 MG/10ML-% IV SOSY
PREFILLED_SYRINGE | INTRAVENOUS | Status: DC | PRN
  Administered 2020-06-06: 5 mg via INTRAVENOUS

## 2020-06-06 MED ORDER — DEXAMETHASONE SODIUM PHOSPHATE 10 MG/ML IJ SOLN
INTRAMUSCULAR | Status: DC | PRN
  Administered 2020-06-06: 5 mg via INTRAVENOUS

## 2020-06-06 NOTE — Progress Notes (Signed)
Mobility Specialist: Progress Note   06/06/20 1625  Mobility  Activity Ambulated in hall  Level of Assistance Modified independent, requires aide device or extra time  Assistive Device Front wheel walker  Distance Ambulated (ft) 300 ft  Mobility Response Tolerated well  Mobility performed by Mobility specialist  $Mobility charge 1 Mobility   Pre-Mobility: 67 HR Post-Mobility: 73 HR, 162/62 BP, 100% SpO2  Pt asx during ambulation. Pt said she feels much better now. I encouraged pt to continue ambulation with staff.   [facility] Temeka Pore Mobility Specialist

## 2020-06-06 NOTE — Op Note (Signed)
Video Bronchoscopy with Electromagnetic Navigation Procedure Note  Date of Operation: 06/06/2020  Pre-op Diagnosis: Right upper lobe mass  Post-op Diagnosis: Same  Surgeon: Baltazar Apo  Assistants: None  Anesthesia: General endotracheal anesthesia  Operation: Flexible video fiberoptic bronchoscopy with electromagnetic navigation and biopsies.  Estimated Blood Loss: Minimal  Complications: None apparent  Indications and History: Holly Stevenson is a 69 y.o. female with issue tobacco use.  She was found to have an irregularly shaped cavitary right upper lobe mass by CT scan of the chest.  She underwent navigational bronchoscopy that was nondiagnostic.  Recommendation was made to repeat her bronchoscopy to try to achieve a tissue diagnosis.  The risks, benefits, complications, treatment options and expected outcomes were discussed with the patient.  The possibilities of pneumothorax, pneumonia, reaction to medication, pulmonary aspiration, perforation of a viscus, bleeding, failure to diagnose a condition and creating a complication requiring transfusion or operation were discussed with the patient who freely signed the consent.    Description of Procedure: The patient was seen in the Preoperative Area, was examined and was deemed appropriate to proceed.  The patient was taken to [facility] endoscopy room 2, identified as Holly Stevenson and the procedure verified as Flexible Video Fiberoptic Bronchoscopy.  A Time Out was held and the above information confirmed.   Prior to the date of the procedure a high-resolution CT scan of the chest was performed. Utilizing [place] a virtual tracheobronchial tree was generated to allow the creation of distinct navigation pathways to the patient's parenchymal abnormalities. After being taken to the operating room general anesthesia was initiated and the patient  was orally intubated. The video fiberoptic bronchoscope was  introduced via the endotracheal tube and a general inspection was performed which showed normal airways throughout.  There were no endobronchial lesions.  Endobronchial brushings were performed in the right mainstem bronchus to facilitate Percepta testing. The extendable working channel and locator guide were introduced into the bronchoscope. The distinct navigation pathways prepared prior to this procedure were then utilized to navigate to within 0.3-0.8 cm of patient's lesion identified on CT scan. The extendable working channel was secured into place and the locator guide was withdrawn. Under fluoroscopic guidance transbronchial needle brushings, transbronchial Wang needle biopsies, and transbronchial forceps biopsies were performed to be sent for cytology and pathology. A bronchioalveolar lavage was performed in the right upper lobe and sent for cytology and microbiology (bacterial, fungal, AFB smears and cultures). At the end of the procedure a general airway inspection was performed and there was no evidence of active bleeding. The bronchoscope was removed.  The patient tolerated the procedure well. There was no significant blood loss and there were no obvious complications. A post-procedural chest x-ray is pending.  Samples: 1. Transbronchial needle brushings from right upper lobe cavitary mass 2. Transbronchial Wang needle biopsies from right upper lobe cavitary mass 3. Transbronchial forceps biopsies from right upper lobe cavitary mass 4. Bronchoalveolar lavage from right upper lobe 5. Endobronchial brushings from right mainstem bronchus  Plans:  The patient will be transferred back to her hospital room once recovered from anesthesia in the PACU.  Chest x-ray will be reviewed.  Okay to for her to restart her heparin infusion at 12:00 06/06/2020. We will review the cytology, pathology and microbiology results with the patient when they become available. Outpatient followup will be with Dr. Lamonte Sakai.     Baltazar Apo, MD, PhD 06/06/2020, 8:54 AM [facility] [phone] or if no  answer [phone]

## 2020-06-06 NOTE — Progress Notes (Signed)
69 year old female status post revision of right to left femorofemoral bypass after thrombectomy and thrombectomy of her left femoropopliteal by Dr. Lenell Antu.  Went to see her this morning and she is in the OR with pulmonology for video bronchoscopy and navigation.  She has done well through the weekend on heparin but is having a fair amount of serous drainage from the right groin and we a Praveena vac on this yesterday.  Cephus Shelling, MD [facility] Office: [phone]   Cephus Shelling

## 2020-06-06 NOTE — Anesthesia Postprocedure Evaluation (Signed)
Anesthesia Post Note  Patient: Takari Duncombe  Procedure(s) Performed: VIDEO BRONCHOSCOPY WITH ENDOBRONCHIAL NAVIGATION (Right ) BRONCHIAL BRUSHINGS BRONCHIAL BIOPSIES BRONCHIAL WASHINGS BRONCHIAL NEEDLE ASPIRATION BIOPSIES     Patient location during evaluation: PACU Anesthesia Type: General Level of consciousness: awake and alert Pain management: pain level controlled Vital Signs Assessment: post-procedure vital signs reviewed and stable Respiratory status: spontaneous breathing, nonlabored ventilation and respiratory function stable Cardiovascular status: blood pressure returned to baseline and stable Postop Assessment: no apparent nausea or vomiting Anesthetic complications: no   No complications documented.  Last Vitals:  Vitals:   06/06/20 1005 06/06/20 1052  BP: (!) 152/39 (!) 153/54  Pulse: 60   Resp: 16 18  Temp: 36.6 C 36.9 C  SpO2: 94% 99%    Last Pain:  Vitals:   06/06/20 1052  TempSrc: Oral  PainSc:                  Anselma Herbel,W. EDMOND

## 2020-06-06 NOTE — Progress Notes (Signed)
ANTICOAGULATION CONSULT NOTE  Pharmacy Consult for heparin  Indication: thrombosed bypass graft , s/p thrombectomy on 06/01/20  No Known Allergies  Patient Measurements: Height: 5\' 8"  (172.7 cm) Weight: 81.6 kg (180 lb) IBW/kg (Calculated) : 63.9 HEPARIN DW (KG): 80.4   Vital Signs: Temp: 97.6 F (36.4 C) (12/21 2026) Temp Source: Oral (12/21 2026) BP: 143/50 (12/21 2026) Pulse Rate: 60 (12/21 2026)  Labs: Recent Labs    06/04/20 0053 06/05/20 0618 06/05/20 1000 06/06/20 1016 06/06/20 2007  HGB 8.6*  --  8.1* 7.5*  --   HCT 24.8*  --  24.5* 24.2*  --   PLT 153  --  190 185  --   HEPARINUNFRC 0.39 0.58  --  <0.10* 0.18*  CREATININE  --   --  0.73  --   --     Estimated Creatinine Clearance: 73.3 mL/min (by C-G formula based on SCr of 0.73 mg/dL).  Assessment: 69 yo female admitted 12/16 for surgery due to  thrombosed bypass graft and R groin hematoma, s/p right iliofemoral endarterectomy and bovine patch angioplasty with R to L femorofemoral bypass and L femoral to AK popliteal bypass on 05/10/20.   Now s/p thrombectomy, evacuation of R groin hematoma done on 06/01/20. Pharmacy consulted to dose/monitor IV heparin infusion 06/01/20.  Goal is lower therapeutic  range of 0.3-0.5 .  Heparin level 0.18. No signs of bleeding  Goal of Therapy:  Heparin level 0.3-0.5 units/ml Monitor platelets by anticoagulation protocol: Yes   Plan: Increase Heparin drip to 1400 units/hr.  Check daily heparin level, CBC.   Thank you for allowing pharmacy to be part of this patients care team.   06/03/20, PharmD, [facility] Clinical Pharmacist Please see AMION for all Pharmacists' Contact Phone Numbers 06/06/2020, 9:06 PM

## 2020-06-06 NOTE — Progress Notes (Signed)
ANTICOAGULATION CONSULT NOTE  Pharmacy Consult for heparin  Indication: thrombosed bypass graft , s/p thrombectomy on 06/01/20  No Known Allergies  Patient Measurements: Height: 5\' 8"  (172.7 cm) Weight: 81.6 kg (180 lb) IBW/kg (Calculated) : 63.9 HEPARIN DW (KG): 80.4   Vital Signs: Temp: 97.6 F (36.4 C) (12/21 2026) Temp Source: Oral (12/21 2026) BP: 143/50 (12/21 2026) Pulse Rate: 60 (12/21 2026)  Labs: Recent Labs    06/04/20 0053 06/05/20 0618 06/05/20 1000 06/06/20 1016 06/06/20 2007  HGB 8.6*  --  8.1* 7.5*  --   HCT 24.8*  --  24.5* 24.2*  --   PLT 153  --  190 185  --   HEPARINUNFRC 0.39 0.58  --  <0.10* 0.18*  CREATININE  --   --  0.73  --   --     Estimated Creatinine Clearance: 73.3 mL/min (by C-G formula based on SCr of 0.73 mg/dL).  Assessment: 69 yo female admitted 12/16 for surgery due to  thrombosed bypass graft and R groin hematoma, s/p right iliofemoral endarterectomy and bovine patch angioplasty with R to L femorofemoral bypass and L femoral to AK popliteal bypass on 05/10/20.   Now s/p thrombectomy, evacuation of R groin hematoma done on 06/01/20. Pharmacy consulted to dose/monitor IV heparin infusion 06/01/20.  Goal is lower therapeutic  range of 0.3-0.5 .  Heparin level 0.18. No signs of bleeding  Goal of Therapy:  Heparin level 0.3-0.5 units/ml Monitor platelets by anticoagulation protocol: Yes   Plan: Increase Heparin drip to 1400 units/hr.  Check 8 hour heparin level Daily heparin level, CBC.   Thank you for allowing pharmacy to be part of this patients care team.   06/03/20, PharmD, [facility] Clinical Pharmacist Please see AMION for all Pharmacists' Contact Phone Numbers 06/06/2020, 9:05 PM

## 2020-06-06 NOTE — Transfer of Care (Signed)
Immediate Anesthesia Transfer of Care Note  Patient: Holly Stevenson  Procedure(s) Performed: VIDEO BRONCHOSCOPY WITH ENDOBRONCHIAL NAVIGATION (Right ) BRONCHIAL BRUSHINGS BRONCHIAL BIOPSIES BRONCHIAL WASHINGS BRONCHIAL NEEDLE ASPIRATION BIOPSIES  Patient Location: PACU  Anesthesia Type:General  Level of Consciousness: awake and drowsy  Airway & Oxygen Therapy: Patient Spontanous Breathing and Patient connected to face mask oxygen  Post-op Assessment: Report given to RN and Post -op Vital signs reviewed and stable  Post vital signs: Reviewed and stable  Last Vitals:  Vitals Value Taken Time  BP 156/46 06/06/20 0902  Temp    Pulse 63 06/06/20 0902  Resp 29 06/06/20 0902  SpO2 100 % 06/06/20 0902  Vitals shown include unvalidated device data.  Last Pain:  Vitals:   06/06/20 0409  TempSrc: Oral  PainSc:          Complications: No complications documented.

## 2020-06-06 NOTE — Progress Notes (Signed)
ANTICOAGULATION CONSULT NOTE  Pharmacy Consult for heparin  Indication: thrombosed bypass graft , s/p thrombectomy on 06/01/20  No Known Allergies  Patient Measurements: Height: 5\' 8"  (172.7 cm) Weight: 81.6 kg (180 lb) IBW/kg (Calculated) : 63.9 HEPARIN DW (KG): 80.4   Vital Signs: Temp: 98.4 F (36.9 C) (12/21 1052) Temp Source: Oral (12/21 1052) BP: 153/54 (12/21 1052) Pulse Rate: 60 (12/21 1005)  Labs: Recent Labs    06/03/20 1336 06/04/20 0053 06/05/20 0618 06/05/20 1000  HGB  --  8.6*  --  8.1*  HCT  --  24.8*  --  24.5*  PLT  --  153  --  190  HEPARINUNFRC 0.34 0.39 0.58  --   CREATININE  --   --   --  0.73    Estimated Creatinine Clearance: 73.3 mL/min (by C-G formula based on SCr of 0.73 mg/dL).  Assessment: 69 yo Stevenson admitted 12/16 for surgery due to  thrombosed bypass graft and R groin hematoma, s/p right iliofemoral endarterectomy and bovine patch angioplasty with R to L femorofemoral bypass and L femoral to AK popliteal bypass on 05/10/20.   Now s/p thrombectomy, evacuation of R groin hematoma done on 06/01/20. Pharmacy consulted to dose/monitor IV heparin infusion 06/01/20.  Yesterday 06/05/20, Heparin drip reduced  to 1300 units/hr to keep  Heparin level at goal 0.3-0.5.  VVS noted patient  having a fair amount of serous drainage from the right groin and wound vac placed on 12/20.21. Heparin infusion stopped at 0130 this AM 12/21 in prep for bronchoscopy for tissue diagnosis of R lung mass found on CT scan.  12/21 she is s/p bronchoscopy and  pharmacy has been consulted to resume IV heparin drip at 12:00 noon  06/06/20.  Goal is lower therapeutic  range of 0.3-0.5 . No signs of bleeding and no hematoma reported.    Hgb low stable in 8s, pltc wnl  12/20.  Goal of Therapy:  Heparin level 0.3-0.5 units/ml Monitor platelets by anticoagulation protocol: Yes   Plan: At 12:00 noon today resume Heparin drip at 1300 units/hr.  Check 8 hour heparin level Daily  heparin level, CBC.    Thank you for allowing pharmacy to be part of this patients care team. 1/21, RPh Clinical Pharmacist [phone] Please check AMION for all [facility] Pharmacy phone numbers After 10:00 PM, call Main Pharmacy [phone] 06/06/2020 11:03 AM

## 2020-06-06 NOTE — Anesthesia Procedure Notes (Signed)
Procedure Name: Intubation Date/Time: 06/06/2020 7:42 AM Performed by: Gwyndolyn Saxon, CRNA Pre-anesthesia Checklist: Patient identified, Emergency Drugs available, Suction available and Patient being monitored Patient Re-evaluated:Patient Re-evaluated prior to induction Oxygen Delivery Method: Circle System Utilized Preoxygenation: Pre-oxygenation with 100% oxygen Induction Type: IV induction Ventilation: Mask ventilation without difficulty Laryngoscope Size: Mac and 3 Grade View: Grade I Tube type: Oral Tube size: 8.5 mm Number of attempts: 1 Airway Equipment and Method: Stylet and Oral airway Placement Confirmation: ETT inserted through vocal cords under direct vision,  positive ETCO2 and breath sounds checked- equal and bilateral Secured at: 23 cm Tube secured with: Tape Dental Injury: Teeth and Oropharynx as per pre-operative assessment  Comments: Performed by Vernelle Emerald, SRNA

## 2020-06-06 NOTE — Progress Notes (Signed)
Mobility Specialist: Progress Note   06/06/20 1518  Mobility  Activity Ambulated to bathroom  Level of Assistance Modified independent, requires aide device or extra time  Assistive Device Front wheel walker  Distance Ambulated (ft) 10 ft  Mobility Response Tolerated well  Mobility performed by Mobility specialist   Pre-Mobility: 59 HR, 153/51 BP, 97% SpO2  Pt bowel incontinent upon entering room. Pt requested to go to BR. NT is currently assisting with pericare. Will f/u.  [facility] Tereasa Yilmaz Mobility Specialist

## 2020-06-06 NOTE — Interval H&P Note (Signed)
History and Physical Interval Note:  06/06/2020 7:23 AM  Holly Stevenson  has presented today for surgery, with the diagnosis of RIGHT LUNG MASS.  The various methods of treatment have been discussed with the patient and family. After consideration of risks, benefits and other options for treatment, the patient has consented to  Procedure(s): VIDEO BRONCHOSCOPY WITH ENDOBRONCHIAL NAVIGATION (Right) as a surgical intervention.  The patient's history has been reviewed, patient examined, no change in status, stable for surgery.  I have reviewed the patient's chart and labs.  Questions were answered to the patient's satisfaction.     Leslye Peer

## 2020-06-06 NOTE — Progress Notes (Signed)
Physical Therapy Treatment Patient Details Name: Holly Stevenson MRN: [id number] DOB: 04-Dec-1950 Today's Date: 06/06/2020    History of Present Illness 69 year old female who presented to postoperative visit with numbness of L LE and increased L foot pain s/p 05/10/20: right to left femoral-femoral artery bypass graft (bilateral) left femoral-popliteal artery bypass graft (left). Readmitted to hospital for exploration of right groin and control of hemorrhage, thrombectomy right to left femoral-femoral bypass graft interposition grafting right common femoral artery to fem-fem bypass thrombectomy left femoral-popliteal bypass graft and angiogram of L LE. PMH: pneumonia, HTN, high cholesterol, and DM.    PT Comments    Pt supine in bed on arrival.  Pt reports feeling groggy from procedure.  Pt performed supine LE exercises with discomfort in L LE.  Continue to recommend no PT follow up.     Follow Up Recommendations  No PT follow up     Equipment Recommendations  None recommended by PT    Recommendations for Other Services       Precautions / Restrictions Precautions Precautions: Fall Restrictions Weight Bearing Restrictions: No    Mobility  Bed Mobility               General bed mobility comments: Pt supine in bed and reports feeling groggy this am after procedure.  Deferred PT session to supine therapeutic exercises.  Transfers                    Ambulation/Gait                 Stairs             Wheelchair Mobility    Modified Rankin (Stroke Patients Only)       Balance Overall balance assessment:  (not assessed.)                                          Cognition Arousal/Alertness: Awake/alert Behavior During Therapy: WFL for tasks assessed/performed Overall Cognitive Status: Within Functional Limits for tasks assessed                                 General Comments: A&Ox4.       Exercises General Exercises - Lower Extremity Ankle Circles/Pumps: AROM;Both;20 reps;Supine Quad Sets: AROM;Both;10 reps;Supine Heel Slides: AROM;AAROM;Both;10 reps;Supine Hip ABduction/ADduction: AROM;Both;10 reps;Supine;AAROM Straight Leg Raises: AROM;AAROM;Both;10 reps;Supine    General Comments        Pertinent Vitals/Pain Pain Assessment: Faces Faces Pain Scale: Hurts even more Pain Location: right groin around incision Pain Descriptors / Indicators: Sore;Operative site guarding Pain Intervention(s): Monitored during session;Repositioned    Home Living                      Prior Function            PT Goals (current goals can now be found in the care plan section) Acute Rehab PT Goals Patient Stated Goal: Return home, enjoy the holidays Potential to Achieve Goals: Good Progress towards PT goals: Progressing toward goals    Frequency    Min 3X/week      PT Plan Current plan remains appropriate    Co-evaluation              AM-PAC PT "6 Clicks" Mobility   Outcome Measure  Help needed turning from your back to your side while in a flat bed without using bedrails?: None Help needed moving from lying on your back to sitting on the side of a flat bed without using bedrails?: None Help needed moving to and from a bed to a chair (including a wheelchair)?: A Little Help needed standing up from a chair using your arms (e.g., wheelchair or bedside chair)?: A Little Help needed to walk in hospital room?: A Little Help needed climbing 3-5 steps with a railing? : A Little 6 Click Score: 20    End of Session Equipment Utilized During Treatment: Gait belt Activity Tolerance: Patient tolerated treatment well Patient left: with call bell/phone within reach;in bed;with bed alarm set Nurse Communication: Mobility status PT Visit Diagnosis: Unsteadiness on feet (R26.81);Other abnormalities of gait and mobility (R26.89);Muscle weakness (generalized)  (M62.81);Difficulty in walking, not elsewhere classified (R26.2);Pain Pain - Right/Left: Right Pain - part of body:  (groin)     Time: 6389-3734 PT Time Calculation (min) (ACUTE ONLY): 12 min  Charges:  $Therapeutic Exercise: 8-22 mins                     Bonney Leitz , PTA Acute Rehabilitation Services Pager [phone] Office [phone]     Domino Holten Artis Delay 06/06/2020, 2:38 PM

## 2020-06-07 ENCOUNTER — Other Ambulatory Visit (HOSPITAL_COMMUNITY): Payer: Self-pay | Admitting: Physician Assistant

## 2020-06-07 LAB — ACID FAST SMEAR (AFB, MYCOBACTERIA): Acid Fast Smear: NEGATIVE

## 2020-06-07 LAB — CBC
HCT: 24.4 % — ABNORMAL LOW (ref 36.0–46.0)
Hemoglobin: 7.8 g/dL — ABNORMAL LOW (ref 12.0–15.0)
MCH: 30.2 pg (ref 26.0–34.0)
MCHC: 32 g/dL (ref 30.0–36.0)
MCV: 94.6 fL (ref 80.0–100.0)
Platelets: 218 10*3/uL (ref 150–400)
RBC: 2.58 MIL/uL — ABNORMAL LOW (ref 3.87–5.11)
RDW: 14.9 % (ref 11.5–15.5)
WBC: 8.1 10*3/uL (ref 4.0–10.5)
nRBC: 0 % (ref 0.0–0.2)

## 2020-06-07 LAB — FUNGUS STAIN

## 2020-06-07 LAB — HEPARIN LEVEL (UNFRACTIONATED): Heparin Unfractionated: 0.15 IU/mL — ABNORMAL LOW (ref 0.30–0.70)

## 2020-06-07 MED ORDER — APIXABAN 5 MG PO TABS: 5.0000 mg | ORAL_TABLET | Freq: Two times a day (BID) | ORAL | 2 refills | Status: DC

## 2020-06-07 MED ORDER — APIXABAN 5 MG PO TABS
5.0000 mg | ORAL_TABLET | Freq: Two times a day (BID) | ORAL | Status: DC
  Administered 2020-06-07: 12:00:00 5 mg via ORAL
  Filled 2020-06-07: qty 1

## 2020-06-07 MED ORDER — OXYCODONE-ACETAMINOPHEN 5-325 MG PO TABS: 1.0000 | ORAL_TABLET | Freq: Four times a day (QID) | ORAL | 0 refills | Status: DC | PRN

## 2020-06-07 MED FILL — OXYCODONE-APAP 5-325MG: 5-325 | 7 days supply | Qty: 30 | Fill #0

## 2020-06-07 MED FILL — ELIQUIS 5 MG TABLET: 5 | 30 days supply | Qty: 60 | Fill #0

## 2020-06-07 NOTE — Progress Notes (Signed)
Patient and daughter given discharge instructions, follow up appointments and medication list. Patient and family verbalized understanding. Patient will be discharged with praveena wound vac to right groin. IV and tele were removed. Will discharge home as ordered. Will be transported to exit via wheel chair and nursing staff. Naraly Fritcher, Randall An RN

## 2020-06-07 NOTE — Progress Notes (Addendum)
[facility] [place] [facility]  Subjective  - Wants to go home.   Objective (!) 138/34 (!) 58 97.8 F (36.6 C) (Oral) 18 98%  Intake/Output Summary (Last 24 hours) at 06/07/2020 0743 Last data filed at 06/07/2020 0400 Gross per 24 hour  Intake 744.64 ml  Output 1 ml  Net 743.64 ml    Doppler signal right PT, left palpable DP Left toe healing without new ischemic changes Right groin prevenna vac in place with good seal minimal drainage in canister.  Left groin healing well Lungs non labored  Assessment/Planning: 69 y.o. female is s/p status post thrombectomy of her right left femorofemoral with revision of the right groin anastomosis and also thrombectomy of her left femoropopliteal bypass5 Days Post-Op.  Pending discharge plan for oral anticoagulation per MD Outpatient followup will be with Dr. Delton Coombes.  S/P pulmonary procedure  F/U with Dr. Lenell Antu in 1-2 weeks  Mosetta Pigeon 06/07/2020 7:43 AM --  Laboratory Lab Results: Recent Labs    06/06/20 1016 06/07/20 0137  WBC 9.6 8.1  HGB 7.5* 7.8*  HCT 24.2* 24.4*  PLT 185 218   BMET Recent Labs    06/05/20 1000  NA 133*  K 4.1  CL 101  CO2 23  GLUCOSE 177*  BUN 19  CREATININE 0.73  CALCIUM 7.5*    COAG Lab Results  Component Value Date   INR 1.0 05/03/2020   INR 1.0 04/25/2020   No results found for: PTT  I have seen and evaluated the patient. I agree with the PA note as documented above.  69 year old female underwent revision of her right to left femorofemoral with thrombectomy and also thrombectomy of the left femoropopliteal bypass.  She has excellent Doppler signals in her feet today.  The left groin actually looks good.  The Praveena in her right groin for serous drainage has been holding excellent seal with no malfunction.  She wants to go home today which seems appropriate.  We will try get her transitioned to Eliquis and plan for discharge later today (plan per Dr. Lenell Antu).   We will have her follow-up with Dr. Lenell Antu on Tuesday given I think she needs very close surveillance as an outpatient given high risk for continued wound breakdown.  Pulmonology is recommending outpatient follow-up.  Cephus Shelling, MD [facility] [place] Office: [phone]

## 2020-06-07 NOTE — Progress Notes (Signed)
Patient Central line Dcd as ordered and per protocol . Tip intact. Pressure held and dressing applied. Patient reminded to lie supine for 30 minutes. bp 141/48 Heart rate 62 will monitor patient. Jalaysha Skilton, Randall An RN

## 2020-06-07 NOTE — Progress Notes (Signed)
ANTICOAGULATION CONSULT NOTE  Pharmacy Consult for heparin  Indication: thrombosed bypass graft , s/p thrombectomy on 06/01/20  No Known Allergies  Patient Measurements: Height: 5\' 8"  (172.7 cm) Weight: 81.6 kg (180 lb) IBW/kg (Calculated) : 63.9 HEPARIN DW (KG): 80.4   Vital Signs: Temp: 97.8 F (36.6 C) (12/21 2328) Temp Source: Oral (12/21 2328) BP: 127/47 (12/21 2328) Pulse Rate: 60 (12/21 2328)  Labs: Recent Labs    06/05/20 1000 06/06/20 1016 06/06/20 2007 06/07/20 0137  HGB 8.1* 7.5*  --  7.8*  HCT 24.5* 24.2*  --  24.4*  PLT 190 185  --  218  HEPARINUNFRC  --  <0.10* 0.18* 0.15*  CREATININE 0.73  --   --   --     Estimated Creatinine Clearance: 73.3 mL/min (by C-G formula based on SCr of 0.73 mg/dL).  Assessment: 69 yo female admitted 12/16 for surgery due to  thrombosed bypass graft and R groin hematoma, s/p right iliofemoral endarterectomy and bovine patch angioplasty with R to L femorofemoral bypass and L femoral to AK popliteal bypass on 05/10/20.   Now s/p thrombectomy, evacuation of R groin hematoma done on 06/01/20. Pharmacy consulted to dose/monitor IV heparin infusion 06/01/20.  Goal is lower therapeutic  range of 0.3-0.5 .  Heparin level 0.18. No signs of bleeding  12/22 AM update:  Heparin level low Hgb low but stable from yesterday  Goal of Therapy:  Heparin level 0.3-0.5 units/ml Monitor platelets by anticoagulation protocol: Yes   Plan: Increase Heparin drip to 1500 units/hr  Re-check heparin level at 1200 Check daily heparin level, CBC  1/23, PharmD, BCPS Clinical Pharmacist Phone: [phone]

## 2020-06-07 NOTE — Discharge Instructions (Signed)
Keep wound vac in place for up to 10 days.  Do not remove until all green lights have disappeared.  Keep right groin dry until vac is removoed.  Shower daily after that and then dry guaze over incision daily until healed.  Information on my medicine - ELIQUIS (apixaban)  This medication education was reviewed with me or my healthcare representative as part of my discharge preparation.    Why was Eliquis prescribed for you? Eliquis was prescribed to  Prevent and treat blood clots that may have been found in the veins of your legs (deep vein thrombosis) or in your lungs (pulmonary embolism) and to reduce the risk of them occurring again.  What do You need to know about Eliquis ? The dose is reduced to ONE 5 mg tablet taken TWICE daily.  Eliquis may be taken with or without food.   Try to take the dose about the same time in the morning and in the evening. If you have difficulty swallowing the tablet whole please discuss with your pharmacist how to take the medication safely.  Take Eliquis exactly as prescribed and DO NOT stop taking Eliquis without talking to the doctor who prescribed the medication.  Stopping may increase your risk of developing a new blood clot.  Refill your prescription before you run out.  After discharge, you should have regular check-up appointments with your healthcare provider that is prescribing your Eliquis.    What do you do if you miss a dose? If a dose of ELIQUIS is not taken at the scheduled time, take it as soon as possible on the same day and twice-daily administration should be resumed. The dose should not be doubled to make up for a missed dose.  Important Safety Information A possible side effect of Eliquis is bleeding. You should call your healthcare provider right away if you experience any of the following: ? Bleeding from an injury or your nose that does not stop. ? Unusual colored urine (red or dark brown) or unusual colored stools (red or  black). ? Unusual bruising for unknown reasons. ? A serious fall or if you hit your head (even if there is no bleeding).  Some medicines may interact with Eliquis and might increase your risk of bleeding or clotting while on Eliquis. To help avoid this, consult your healthcare provider or pharmacist prior to using any new prescription or non-prescription medications, including herbals, vitamins, non-steroidal anti-inflammatory drugs (NSAIDs) and supplements.  This website has more information on Eliquis (apixaban): http://www.eliquis.com/eliquis/home

## 2020-06-07 NOTE — Progress Notes (Addendum)
TOC pharmacy to provide meds to bed prior to discharge including 30 day free of Eliquis with drug card, CM provided pt with copay assist card to use at own pharmacy next month should she need it- per benefits check - copay should be zero dollars- pt voiced appreciation and understanding.

## 2020-06-08 ENCOUNTER — Other Ambulatory Visit: Payer: Self-pay | Admitting: Physician Assistant

## 2020-06-08 LAB — CULTURE, BAL-QUANTITATIVE W GRAM STAIN: Culture: NO GROWTH

## 2020-06-08 LAB — CYTOLOGY - NON PAP

## 2020-06-09 ENCOUNTER — Telehealth: Payer: Self-pay | Admitting: Emergency Medicine

## 2020-06-09 LAB — CULTURE, RESPIRATORY W GRAM STAIN: Gram Stain: NONE SEEN

## 2020-06-09 NOTE — Telephone Encounter (Signed)
Left pt a message, will call her back to review in detail Path >> no malignancy, shows granulomas, ? Etiology (AFB's have been negative)

## 2020-06-13 ENCOUNTER — Other Ambulatory Visit: Payer: Self-pay

## 2020-06-13 ENCOUNTER — Encounter (HOSPITAL_COMMUNITY): Payer: BC Managed Care – PPO

## 2020-06-13 ENCOUNTER — Other Ambulatory Visit (HOSPITAL_COMMUNITY): Payer: BC Managed Care – PPO

## 2020-06-13 ENCOUNTER — Encounter (HOSPITAL_COMMUNITY): Payer: Self-pay | Admitting: Vascular Surgery

## 2020-06-13 ENCOUNTER — Encounter: Payer: Self-pay | Admitting: Vascular Surgery

## 2020-06-13 ENCOUNTER — Ambulatory Visit (INDEPENDENT_AMBULATORY_CARE_PROVIDER_SITE_OTHER): Payer: Self-pay | Admitting: Vascular Surgery

## 2020-06-13 VITALS — BP 193/53 | HR 70 | Temp 98.0°F | Resp 20 | Ht 68.0 in | Wt 180.0 lb

## 2020-06-13 DIAGNOSIS — T8149XA Infection following a procedure, other surgical site, initial encounter: Secondary | ICD-10-CM

## 2020-06-13 NOTE — Progress Notes (Signed)
ASSESSMENT & PLAN:  69 y.o. female with right groin dehiscence after right groin exploration, repair of pseudoaneurysm with interposition grafting, thrombectomy of fem-fem bypass, thrombectomy of left fem-pop bypass.   Plan on operative debridement and washout tomorrow. Likely will need admission for wound care. May need plastic surgical involvement depending on operative findings.  CHIEF COMPLAINT:   Right groin drainage.  HISTORY:  HISTORY OF PRESENT ILLNESS: Holly Stevenson is a 69 y.o. female well known to me with a complex recent vascular history. In brief, she underwent right iliofemoral endarterectomy, right-to-left fem-fem bypass, right fem-above knee popliteal bypass 05/10/20. She returned to care 06/01/20 with a large right groin pseudoaneurysm. This was repaired with right fem-fem interposition bypass. Her grafts had thrombosed requiring thrombectomy. She had return of adequate flow postoperatively.   She returns to clinic today reporting continued drainage from her right groin. No constitutional symptoms.  Past Medical History:  Diagnosis Date  . Diabetes mellitus without complication (HCC)   . High cholesterol   . Hypertension   . Pneumonia     Past Surgical History:  Procedure Laterality Date  . ABDOMINAL AORTOGRAM W/LOWER EXTREMITY Bilateral 04/28/2020   Procedure: ABDOMINAL AORTOGRAM W/LOWER EXTREMITY;  Surgeon: Sherren KernsFields, Charles E, MD;  [facility];  Service: Cardiovascular;  Laterality: Bilateral;  . BRONCHIAL BIOPSY  05/23/2020   Procedure: BRONCHIAL BIOPSIES;  Surgeon: Leslye PeerByrum, Robert S, MD;  [facility];  Service: Cardiopulmonary;;  . BRONCHIAL BIOPSY  06/06/2020   Procedure: BRONCHIAL BIOPSIES;  Surgeon: Leslye PeerByrum, Robert S, MD;  [facility];  Service: Pulmonary;;  . BRONCHIAL BRUSHINGS  05/23/2020   Procedure: BRONCHIAL BRUSHINGS;  Surgeon: Leslye PeerByrum, Robert S, MD;  [facility];  Service: Cardiopulmonary;;  .  BRONCHIAL BRUSHINGS  06/06/2020   Procedure: BRONCHIAL BRUSHINGS;  Surgeon: Leslye PeerByrum, Robert S, MD;  [facility];  Service: Pulmonary;;  . BRONCHIAL NEEDLE ASPIRATION BIOPSY  05/23/2020   Procedure: BRONCHIAL NEEDLE ASPIRATION BIOPSIES;  Surgeon: Leslye PeerByrum, Robert S, MD;  [facility];  Service: Cardiopulmonary;;  . BRONCHIAL NEEDLE ASPIRATION BIOPSY  06/06/2020   Procedure: BRONCHIAL NEEDLE ASPIRATION BIOPSIES;  Surgeon: Leslye PeerByrum, Robert S, MD;  [facility];  Service: Pulmonary;;  . BRONCHIAL WASHINGS  05/23/2020   Procedure: BRONCHIAL WASHINGS;  Surgeon: Leslye PeerByrum, Robert S, MD;  [facility];  Service: Cardiopulmonary;;  . BRONCHIAL WASHINGS  06/06/2020   Procedure: BRONCHIAL WASHINGS;  Surgeon: Leslye PeerByrum, Robert S, MD;  [facility];  Service: Pulmonary;;  . COLONOSCOPY W/ BIOPSIES AND POLYPECTOMY    . ELECTROMAGNETIC NAVIGATION BROCHOSCOPY N/A 05/23/2020   Procedure: ELECTROMAGNETIC NAVIGATION BRONCHOSCOPY;  Surgeon: Leslye PeerByrum, Robert S, MD;  [facility];  Service: Cardiopulmonary;  Laterality: N/A;  . ENDARTERECTOMY FEMORAL Right 05/10/2020   Procedure: ENDARTERECTOMY RIGHT FEMORAL;  Surgeon: Leonie DouglasHawken, Marquasha Brutus N, MD;  [facility];  Service: Vascular;  Laterality: Right;  . FEMORAL-FEMORAL BYPASS GRAFT Bilateral 05/10/2020   Procedure: RIGHT TO LEFT FEMORAL-FEMORAL ARTERY BYPASS GRAFT;  Surgeon: Leonie DouglasHawken, Dnya Hickle N, MD;  [facility];  Service: Vascular;  Laterality: Bilateral;  . FEMORAL-FEMORAL BYPASS GRAFT Right 06/01/2020   Procedure: INTERPOSITION GRAFT RIGHT COMMON FEMORAL TO FEMORAL-FEMORAL BYPASS;  Surgeon: Leonie DouglasHawken, Eldredge Veldhuizen N, MD;  [facility];  Service: Vascular;  Laterality: Right;  . FEMORAL-POPLITEAL BYPASS GRAFT Left 05/10/2020   Procedure: LEFT FEMORAL-POPLITEAL ARTERY BYPASS GRAFT;  Surgeon: Leonie DouglasHawken, Maury Bamba N, MD;  [facility];  Service: Vascular;  Laterality: Left;  . GROIN DISSECTION Right 06/01/2020   Procedure:  RIGHT GROIN  EXPLORATION;  Surgeon: Leonie Douglas, MD;  [facility];  Service: Vascular;  Laterality: Right;  . INTRAOPERATIVE ARTERIOGRAM Left 06/01/2020   Procedure: LEFT LOWER EXTREMITY  ARTERIOGRAM;  Surgeon: Leonie Douglas, MD;  [facility];  Service: Vascular;  Laterality: Left;  . LOWER EXTREMITY ANGIOGRAM Left 05/10/2020   Procedure: LEFT LOWER EXTREMITY ANGIOGRAM;  Surgeon: Leonie Douglas, MD;  Location: [facility];  Service: Vascular;  Laterality: Left;  . PATCH ANGIOPLASTY Right 05/10/2020   Procedure: PATCH ANGIOPLASTY USING Kathleen Lime;  Surgeon: Leonie Douglas, MD;  [facility];  Service: Vascular;  Laterality: Right;  . PERIPHERAL VASCULAR BALLOON ANGIOPLASTY Left 04/28/2020   Procedure: PERIPHERAL VASCULAR BALLOON ANGIOPLASTY;  Surgeon: Sherren Kerns, MD;  [facility];  Service: Cardiovascular;  Laterality: Left;  Unable to cross left iliac lesion to intervene  . THROMBECTOMY FEMORAL ARTERY N/A 06/01/2020   Procedure: THROMBECTOMY FEMORAL- FEMORAL BYPASS;  Surgeon: Leonie Douglas, MD;  [facility]: [facility] OR;  Service: Vascular;  Laterality: N/A;  . THROMBECTOMY FEMORAL- FEMORAL BYPASS (N/A )  06/01/2020  . THROMBECTOMY OF BYPASS GRAFT FEMORAL- POPLITEAL ARTERY Left 06/01/2020   Procedure: THROMBECTOMY OF LEFT BYPASS GRAFT FEMORAL-POPLITEAL ARTERY;  Surgeon: Leonie Douglas, MD;  Location: [facility] OR;  Service: Vascular;  Laterality: Left;  . THROMBECTOMY OF LEFT BYPASS GRAFT FEMORAL-POPLITEAL ARTERY (Left )  06/01/2020  . VASCULAR SURGERY  04/2020  . VIDEO BRONCHOSCOPY N/A 05/23/2020   Procedure: VIDEO BRONCHOSCOPY WITH FLUORO;  Surgeon: Leslye Peer, MD;  Location[facility] ENDOSCOPY;  Service: Cardiopulmonary;  Laterality: N/A;  . VIDEO BRONCHOSCOPY WITH ENDOBRONCHIAL NAVIGATION Right 06/06/2020   Procedure: VIDEO BRONCHOSCOPY WITH ENDOBRONCHIAL NAVIGATION;  Surgeon: Leslye Peer, MD;  [facility];  Service: Pulmonary;  Laterality: Right;    Family  History  Problem Relation Age of Onset  . Peripheral Artery Disease Neg Hx     Social History   Socioeconomic History  . Marital status: Widowed    Spouse name: Not on file  . Number of children: Not on file  . Years of education: Not on file  . Highest education level: Not on file  Occupational History  . Not on file  Tobacco Use  . Smoking status: Heavy Tobacco Smoker    Packs/day: 1.50    Years: 50.00    Pack years: 75.00    Types: Cigarettes  . Smokeless tobacco: Never Used  . Tobacco comment: 3 cigarettes daily 05/29/20 [facility]   Vaping Use  . Vaping Use: Never used  Substance and Sexual Activity  . Alcohol use: Not Currently  . Drug use: Never  . Sexual activity: Not Currently  Other Topics Concern  . Not on file  Social History Narrative  . Not on file   Social Determinants of Health   Financial Resource Strain: Not on file  Food Insecurity: Not on file  Transportation Needs: Not on file  Physical Activity: Not on file  Stress: Not on file  Social Connections: Not on file  Intimate Partner Violence: Not on file    No Known Allergies  Current Outpatient Medications  Medication Sig Dispense Refill  . acetaminophen (TYLENOL) 500 MG tablet Take 1,500 mg by mouth 3 (three) times daily as needed for moderate pain or headache.    Marland Kitchen apixaban (ELIQUIS) 5 MG TABS tablet Take 1 tablet (5 mg total) by mouth 2 (two) times daily. 60 tablet 2  . aspirin EC 81 MG EC  tablet Take 1 tablet (81 mg total) by mouth daily at 6 (six) AM. Swallow whole. 30 tablet 11  . atorvastatin (LIPITOR) 40 MG tablet Take 1 tablet (40 mg total) by mouth daily.    . benazepril (LOTENSIN) 10 MG tablet Take 10 mg by mouth daily.    . cholecalciferol (VITAMIN D3) 25 MCG (1000 UNIT) tablet Take 1,000 Units by mouth daily.    . feeding supplement, GLUCERNA SHAKE, (GLUCERNA SHAKE) LIQD Take 237 mLs by mouth 3 (three) times daily between meals. 237 mL 0  . JARDIANCE 25 MG TABS tablet Take 25 mg by mouth  daily.    . metFORMIN (GLUCOPHAGE) 500 MG tablet Take 1,000 mg by mouth 2 (two) times daily.     . Multiple Vitamins-Minerals (MULTIVITAMIN WITH MINERALS) tablet Take 1 tablet by mouth daily.    Marland Kitchen oxyCODONE-acetaminophen (PERCOCET/ROXICET) 5-325 MG tablet Take 1 tablet by mouth every 6 (six) hours as needed for moderate pain. 30 tablet 0  . Tiotropium Bromide Monohydrate (SPIRIVA RESPIMAT) 1.25 MCG/ACT AERS Inhale 2 puffs into the lungs daily. 4 g 0   No current facility-administered medications for this visit.    REVIEW OF SYSTEMS:  [X]  denotes positive finding, [ ]  denotes negative finding Cardiac  Comments:  Chest pain or chest pressure:    Shortness of breath upon exertion:    Short of breath when lying flat:    Irregular heart rhythm:        Vascular    Pain in calf, thigh, or hip brought on by ambulation:    Pain in feet at night that wakes you up from your sleep:     Blood clot in your veins:    Leg swelling:         Pulmonary    Oxygen at home:    Productive cough:     Wheezing:         Neurologic    Sudden weakness in arms or legs:     Sudden numbness in arms or legs:     Sudden onset of difficulty speaking or slurred speech:    Temporary loss of vision in one eye:     Problems with dizziness:         Gastrointestinal    Blood in stool:     Vomited blood:         Genitourinary    Burning when urinating:     Blood in urine:        Psychiatric    Major depression:         Hematologic    Bleeding problems:    Problems with blood clotting too easily:        Skin    Rashes or ulcers:        Constitutional    Fever or chills:     PHYSICAL EXAM:   Vitals:   06/13/20 1543  BP: (!) 193/53  Pulse: 70  Resp: 20  Temp: 98 F (36.7 C)  SpO2: 99%  Weight: 180 lb (81.6 kg)  Height: 5\' 8"  (1.727 m)    Right groin with superficial dehiscence and drainage.  DATA REVIEW:    Most recent CBC CBC Latest Ref Rng & Units 06/07/2020 06/06/2020 06/05/2020   WBC 4.0 - 10.5 K/uL 8.1 9.6 10.7(H)  Hemoglobin 12.0 - 15.0 g/dL 7.8(L) 7.5(L) 8.1(L)  Hematocrit 36.0 - 46.0 % 24.4(L) 24.2(L) 24.5(L)  Platelets 150 - 400 K/uL 218 185 190     Most recent CMP  CMP Latest Ref Rng & Units 06/05/2020 06/02/2020 06/01/2020  Glucose 70 - 99 mg/dL 622(W) 979(G) -  BUN 8 - 23 mg/dL 19 92(J) -  Creatinine 0.44 - 1.00 mg/dL 1.94 1.74 0.81(K)  Sodium 135 - 145 mmol/L 133(L) 134(L) -  Potassium 3.5 - 5.1 mmol/L 4.1 4.0 -  Chloride 98 - 111 mmol/L 101 102 -  CO2 22 - 32 mmol/L 23 21(L) -  Calcium 8.9 - 10.3 mg/dL 7.5(L) 7.5(L) -  Total Protein 6.5 - 8.1 g/dL - - -  Total Bilirubin 0.3 - 1.2 mg/dL - - -  Alkaline Phos 38 - 126 U/L - - -  AST 15 - 41 U/L - - -  ALT 0 - 44 U/L - - -    Renal function Estimated Creatinine Clearance: 73.3 mL/min (by C-G formula based on SCr of 0.73 mg/dL).  Hgb A1c MFr Bld (%)  Date Value  05/11/2020 7.2 (H)    LDL Cholesterol  Date Value Ref Range Status  06/02/2020 32 0 - 99 mg/dL Final    Comment:           Total Cholesterol/HDL:CHD Risk Coronary Heart Disease Risk Table                     Men   Women  1/2 Average Risk   3.4   3.3  Average Risk       5.0   4.4  2 X Average Risk   9.6   7.1  3 X Average Risk  23.4   11.0        Use the calculated Patient Ratio above and the CHD Risk Table to determine the patient's CHD Risk.        ATP III CLASSIFICATION (LDL):  <100     mg/dL   Optimal  481-856  mg/dL   Near or Above                    Optimal  130-159  mg/dL   Borderline  314-970  mg/dL   High  >263     mg/dL   Very High Performed at [facility], [address] [place], [place] [address]      Rande Brunt. Lenell Antu, MD [facility] Phone Number: [phone] 06/13/2020 5:09 PM

## 2020-06-13 NOTE — Progress Notes (Signed)
Ms Holly Stevenson denies chest pain or shortness of breath at this time. Patient denies s/s of Covid or being in contact with anyone with Covid. Ms Holly Stevenson will be tested for Covid on arrival to the hospital.  Ms Holly Stevenson has type II diabetes, I instructed patient to not take Jardiance or Metformin in am. Patient had taken Jardiance this am.  Ms Holly Stevenson does not have strips to check CBG.  Ms Holly Stevenson was instructed in the office to not take Eliquis in am.

## 2020-06-13 NOTE — H&P (View-Only) (Signed)
  ASSESSMENT & PLAN:  69 y.o. female with right groin dehiscence after right groin exploration, repair of pseudoaneurysm with interposition grafting, thrombectomy of fem-fem bypass, thrombectomy of left fem-pop bypass.   Plan on operative debridement and washout tomorrow. Likely will need admission for wound care. May need plastic surgical involvement depending on operative findings.  CHIEF COMPLAINT:   Right groin drainage.  HISTORY:  HISTORY OF PRESENT ILLNESS: Holly Stevenson is a 69 y.o. female well known to me with a complex recent vascular history. In brief, she underwent right iliofemoral endarterectomy, right-to-left fem-fem bypass, right fem-above knee popliteal bypass 05/10/20. She returned to care 06/01/20 with a large right groin pseudoaneurysm. This was repaired with right fem-fem interposition bypass. Her grafts had thrombosed requiring thrombectomy. She had return of adequate flow postoperatively.   She returns to clinic today reporting continued drainage from her right groin. No constitutional symptoms.  Past Medical History:  Diagnosis Date  . Diabetes mellitus without complication (HCC)   . High cholesterol   . Hypertension   . Pneumonia     Past Surgical History:  Procedure Laterality Date  . ABDOMINAL AORTOGRAM W/LOWER EXTREMITY Bilateral 04/28/2020   Procedure: ABDOMINAL AORTOGRAM W/LOWER EXTREMITY;  Surgeon: Fields, Charles E, MD;  [facility];  Service: Cardiovascular;  Laterality: Bilateral;  . BRONCHIAL BIOPSY  05/23/2020   Procedure: BRONCHIAL BIOPSIES;  Surgeon: Byrum, Robert S, MD;  [facility]: [facility];  Service: Cardiopulmonary;;  . BRONCHIAL BIOPSY  06/06/2020   Procedure: BRONCHIAL BIOPSIES;  Surgeon: Byrum, Robert S, MD;  [facility];  Service: Pulmonary;;  . BRONCHIAL BRUSHINGS  05/23/2020   Procedure: BRONCHIAL BRUSHINGS;  Surgeon: Byrum, Robert S, MD;  [facility];  Service: Cardiopulmonary;;  .  BRONCHIAL BRUSHINGS  06/06/2020   Procedure: BRONCHIAL BRUSHINGS;  Surgeon: Byrum, Robert S, MD;  [facility];  Service: Pulmonary;;  . BRONCHIAL NEEDLE ASPIRATION BIOPSY  05/23/2020   Procedure: BRONCHIAL NEEDLE ASPIRATION BIOPSIES;  Surgeon: Byrum, Robert S, MD;  [facility];  Service: Cardiopulmonary;;  . BRONCHIAL NEEDLE ASPIRATION BIOPSY  06/06/2020   Procedure: BRONCHIAL NEEDLE ASPIRATION BIOPSIES;  Surgeon: Byrum, Robert S, MD;  [facility];  Service: Pulmonary;;  . BRONCHIAL WASHINGS  05/23/2020   Procedure: BRONCHIAL WASHINGS;  Surgeon: Byrum, Robert S, MD;  [facility];  Service: Cardiopulmonary;;  . BRONCHIAL WASHINGS  06/06/2020   Procedure: BRONCHIAL WASHINGS;  Surgeon: Byrum, Robert S, MD;  [facility];  Service: Pulmonary;;  . COLONOSCOPY W/ BIOPSIES AND POLYPECTOMY    . ELECTROMAGNETIC NAVIGATION BROCHOSCOPY N/A 05/23/2020   Procedure: ELECTROMAGNETIC NAVIGATION BRONCHOSCOPY;  Surgeon: Byrum, Robert S, MD;  [facility];  Service: Cardiopulmonary;  Laterality: N/A;  . ENDARTERECTOMY FEMORAL Right 05/10/2020   Procedure: ENDARTERECTOMY RIGHT FEMORAL;  Surgeon: Jadarian Mckay N, MD;  [facility];  Service: Vascular;  Laterality: Right;  . FEMORAL-FEMORAL BYPASS GRAFT Bilateral 05/10/2020   Procedure: RIGHT TO LEFT FEMORAL-FEMORAL ARTERY BYPASS GRAFT;  Surgeon: Zaharah Amir N, MD;  [facility];  Service: Vascular;  Laterality: Bilateral;  . FEMORAL-FEMORAL BYPASS GRAFT Right 06/01/2020   Procedure: INTERPOSITION GRAFT RIGHT COMMON FEMORAL TO FEMORAL-FEMORAL BYPASS;  Surgeon: Stetson Pelaez N, MD;  [facility];  Service: Vascular;  Laterality: Right;  . FEMORAL-POPLITEAL BYPASS GRAFT Left 05/10/2020   Procedure: LEFT FEMORAL-POPLITEAL ARTERY BYPASS GRAFT;  Surgeon: Alekhya Gravlin N, MD;  [facility];  Service: Vascular;  Laterality: Left;  . GROIN DISSECTION Right 06/01/2020   Procedure:   RIGHT GROIN  EXPLORATION;  Surgeon: Brunilda Eble N, MD;  [facility];  Service: Vascular;  Laterality: Right;  . INTRAOPERATIVE ARTERIOGRAM Left 06/01/2020   Procedure: LEFT LOWER EXTREMITY  ARTERIOGRAM;  Surgeon: Deloy Archey N, MD;  [facility];  Service: Vascular;  Laterality: Left;  . LOWER EXTREMITY ANGIOGRAM Left 05/10/2020   Procedure: LEFT LOWER EXTREMITY ANGIOGRAM;  Surgeon: Tamryn Popko N, MD;  [facility];  Service: Vascular;  Laterality: Left;  . PATCH ANGIOPLASTY Right 05/10/2020   Procedure: PATCH ANGIOPLASTY USING XENOSURE PATCH;  Surgeon: Larhonda Dettloff N, MD;  [facility];  Service: Vascular;  Laterality: Right;  . PERIPHERAL VASCULAR BALLOON ANGIOPLASTY Left 04/28/2020   Procedure: PERIPHERAL VASCULAR BALLOON ANGIOPLASTY;  Surgeon: Fields, Charles E, MD;  [facility];  Service: Cardiovascular;  Laterality: Left;  Unable to cross left iliac lesion to intervene  . THROMBECTOMY FEMORAL ARTERY N/A 06/01/2020   Procedure: THROMBECTOMY FEMORAL- FEMORAL BYPASS;  Surgeon: Luevenia Mcavoy N, MD;  [facility];  Service: Vascular;  Laterality: N/A;  . THROMBECTOMY FEMORAL- FEMORAL BYPASS (N/A )  06/01/2020  . THROMBECTOMY OF BYPASS GRAFT FEMORAL- POPLITEAL ARTERY Left 06/01/2020   Procedure: THROMBECTOMY OF LEFT BYPASS GRAFT FEMORAL-POPLITEAL ARTERY;  Surgeon: Hersel Mcmeen N, MD;  [facility];  Service: Vascular;  Laterality: Left;  . THROMBECTOMY OF LEFT BYPASS GRAFT FEMORAL-POPLITEAL ARTERY (Left )  06/01/2020  . VASCULAR SURGERY  04/2020  . VIDEO BRONCHOSCOPY N/A 05/23/2020   Procedure: VIDEO BRONCHOSCOPY WITH FLUORO;  Surgeon: Byrum, Robert S, MD;  [facility];  Service: Cardiopulmonary;  Laterality: N/A;  . VIDEO BRONCHOSCOPY WITH ENDOBRONCHIAL NAVIGATION Right 06/06/2020   Procedure: VIDEO BRONCHOSCOPY WITH ENDOBRONCHIAL NAVIGATION;  Surgeon: Byrum, Robert S, MD;  [facility];  Service: Pulmonary;  Laterality: Right;    Family  History  Problem Relation Age of Onset  . Peripheral Artery Disease Neg Hx     Social History   Socioeconomic History  . Marital status: Widowed    Spouse name: Not on file  . Number of children: Not on file  . Years of education: Not on file  . Highest education level: Not on file  Occupational History  . Not on file  Tobacco Use  . Smoking status: Heavy Tobacco Smoker    Packs/day: 1.50    Years: 50.00    Pack years: 75.00    Types: Cigarettes  . Smokeless tobacco: Never Used  . Tobacco comment: 3 cigarettes daily 05/29/20 [facility]   Vaping Use  . Vaping Use: Never used  Substance and Sexual Activity  . Alcohol use: Not Currently  . Drug use: Never  . Sexual activity: Not Currently  Other Topics Concern  . Not on file  Social History Narrative  . Not on file   Social Determinants of Health   Financial Resource Strain: Not on file  Food Insecurity: Not on file  Transportation Needs: Not on file  Physical Activity: Not on file  Stress: Not on file  Social Connections: Not on file  Intimate Partner Violence: Not on file    No Known Allergies  Current Outpatient Medications  Medication Sig Dispense Refill  . acetaminophen (TYLENOL) 500 MG tablet Take 1,500 mg by mouth 3 (three) times daily as needed for moderate pain or headache.    . apixaban (ELIQUIS) 5 MG TABS tablet Take 1 tablet (5 mg total) by mouth 2 (two) times daily. 60 tablet 2  . aspirin EC 81 MG EC   tablet Take 1 tablet (81 mg total) by mouth daily at 6 (six) AM. Swallow whole. 30 tablet 11  . atorvastatin (LIPITOR) 40 MG tablet Take 1 tablet (40 mg total) by mouth daily.    . benazepril (LOTENSIN) 10 MG tablet Take 10 mg by mouth daily.    . cholecalciferol (VITAMIN D3) 25 MCG (1000 UNIT) tablet Take 1,000 Units by mouth daily.    . feeding supplement, GLUCERNA SHAKE, (GLUCERNA SHAKE) LIQD Take 237 mLs by mouth 3 (three) times daily between meals. 237 mL 0  . JARDIANCE 25 MG TABS tablet Take 25 mg by mouth  daily.    . metFORMIN (GLUCOPHAGE) 500 MG tablet Take 1,000 mg by mouth 2 (two) times daily.     . Multiple Vitamins-Minerals (MULTIVITAMIN WITH MINERALS) tablet Take 1 tablet by mouth daily.    . oxyCODONE-acetaminophen (PERCOCET/ROXICET) 5-325 MG tablet Take 1 tablet by mouth every 6 (six) hours as needed for moderate pain. 30 tablet 0  . Tiotropium Bromide Monohydrate (SPIRIVA RESPIMAT) 1.25 MCG/ACT AERS Inhale 2 puffs into the lungs daily. 4 g 0   No current facility-administered medications for this visit.    REVIEW OF SYSTEMS:  [X] denotes positive finding, [ ] denotes negative finding Cardiac  Comments:  Chest pain or chest pressure:    Shortness of breath upon exertion:    Short of breath when lying flat:    Irregular heart rhythm:        Vascular    Pain in calf, thigh, or hip brought on by ambulation:    Pain in feet at night that wakes you up from your sleep:     Blood clot in your veins:    Leg swelling:         Pulmonary    Oxygen at home:    Productive cough:     Wheezing:         Neurologic    Sudden weakness in arms or legs:     Sudden numbness in arms or legs:     Sudden onset of difficulty speaking or slurred speech:    Temporary loss of vision in one eye:     Problems with dizziness:         Gastrointestinal    Blood in stool:     Vomited blood:         Genitourinary    Burning when urinating:     Blood in urine:        Psychiatric    Major depression:         Hematologic    Bleeding problems:    Problems with blood clotting too easily:        Skin    Rashes or ulcers:        Constitutional    Fever or chills:     PHYSICAL EXAM:   Vitals:   06/13/20 1543  BP: (!) 193/53  Pulse: 70  Resp: 20  Temp: 98 F (36.7 C)  SpO2: 99%  Weight: 180 lb (81.6 kg)  Height: 5' 8" (1.727 m)    Right groin with superficial dehiscence and drainage.  DATA REVIEW:    Most recent CBC CBC Latest Ref Rng & Units 06/07/2020 06/06/2020 06/05/2020   WBC 4.0 - 10.5 K/uL 8.1 9.6 10.7(H)  Hemoglobin 12.0 - 15.0 g/dL 7.8(L) 7.5(L) 8.1(L)  Hematocrit 36.0 - 46.0 % 24.4(L) 24.2(L) 24.5(L)  Platelets 150 - 400 K/uL 218 185 190     Most recent CMP   CMP Latest Ref Rng & Units 06/05/2020 06/02/2020 06/01/2020  Glucose 70 - 99 mg/dL 177(H) 155(H) -  BUN 8 - 23 mg/dL 19 27(H) -  Creatinine 0.44 - 1.00 mg/dL 0.73 0.73 1.07(H)  Sodium 135 - 145 mmol/L 133(L) 134(L) -  Potassium 3.5 - 5.1 mmol/L 4.1 4.0 -  Chloride 98 - 111 mmol/L 101 102 -  CO2 22 - 32 mmol/L 23 21(L) -  Calcium 8.9 - 10.3 mg/dL 7.5(L) 7.5(L) -  Total Protein 6.5 - 8.1 g/dL - - -  Total Bilirubin 0.3 - 1.2 mg/dL - - -  Alkaline Phos 38 - 126 U/L - - -  AST 15 - 41 U/L - - -  ALT 0 - 44 U/L - - -    Renal function Estimated Creatinine Clearance: 73.3 mL/min (by C-G formula based on SCr of 0.73 mg/dL).  Hgb A1c MFr Bld (%)  Date Value  05/11/2020 7.2 (H)    LDL Cholesterol  Date Value Ref Range Status  06/02/2020 32 0 - 99 mg/dL Final    Comment:           Total Cholesterol/HDL:CHD Risk Coronary Heart Disease Risk Table                     Men   Women  1/2 Average Risk   3.4   3.3  Average Risk       5.0   4.4  2 X Average Risk   9.6   7.1  3 X Average Risk  23.4   11.0        Use the calculated Patient Ratio above and the CHD Risk Table to determine the patient's CHD Risk.        ATP III CLASSIFICATION (LDL):  <100     mg/dL   Optimal  100-129  mg/dL   Near or Above                    Optimal  130-159  mg/dL   Borderline  160-189  mg/dL   High  >190     mg/dL   Very High Performed at [facility], [address] [place], [place] [address]      Orrie Lascano N. Kelcee Bjorn, MD [facility] Office Phone Number: [phone] 06/13/2020 5:09 PM    

## 2020-06-14 ENCOUNTER — Inpatient Hospital Stay (HOSPITAL_COMMUNITY): Payer: BC Managed Care – PPO | Admitting: Certified Registered"

## 2020-06-14 ENCOUNTER — Encounter (HOSPITAL_COMMUNITY): Payer: Self-pay | Admitting: Vascular Surgery

## 2020-06-14 ENCOUNTER — Encounter (HOSPITAL_COMMUNITY)
Admission: AD | Disposition: A | Discharge status: Hospice | Payer: Self-pay | Source: Home / Self Care | Attending: Vascular Surgery

## 2020-06-14 ENCOUNTER — Inpatient Hospital Stay (HOSPITAL_COMMUNITY)
Admission: AD | Admit: 2020-06-14 | Discharge: 2020-07-17 | DRG: 270 | Disposition: A | Discharge status: Hospice | Payer: BC Managed Care – PPO | Attending: Vascular Surgery | Admitting: Vascular Surgery

## 2020-06-14 DIAGNOSIS — J95821 Acute postprocedural respiratory failure: Secondary | ICD-10-CM | POA: Diagnosis not present

## 2020-06-14 DIAGNOSIS — G4700 Insomnia, unspecified: Secondary | ICD-10-CM | POA: Diagnosis present

## 2020-06-14 DIAGNOSIS — Y832 Surgical operation with anastomosis, bypass or graft as the cause of abnormal reaction of the patient, or of later complication, without mention of misadventure at the time of the procedure: Secondary | ICD-10-CM | POA: Diagnosis present

## 2020-06-14 DIAGNOSIS — I959 Hypotension, unspecified: Secondary | ICD-10-CM | POA: Diagnosis not present

## 2020-06-14 DIAGNOSIS — Z6829 Body mass index (BMI) 29.0-29.9, adult: Secondary | ICD-10-CM

## 2020-06-14 DIAGNOSIS — J440 Chronic obstructive pulmonary disease with acute lower respiratory infection: Secondary | ICD-10-CM | POA: Diagnosis not present

## 2020-06-14 DIAGNOSIS — Z7901 Long term (current) use of anticoagulants: Secondary | ICD-10-CM

## 2020-06-14 DIAGNOSIS — Z515 Encounter for palliative care: Secondary | ICD-10-CM | POA: Diagnosis not present

## 2020-06-14 DIAGNOSIS — Z7189 Other specified counseling: Secondary | ICD-10-CM | POA: Diagnosis not present

## 2020-06-14 DIAGNOSIS — F05 Delirium due to known physiological condition: Secondary | ICD-10-CM | POA: Diagnosis not present

## 2020-06-14 DIAGNOSIS — Z01810 Encounter for preprocedural cardiovascular examination: Secondary | ICD-10-CM | POA: Diagnosis not present

## 2020-06-14 DIAGNOSIS — S301XXA Contusion of abdominal wall, initial encounter: Secondary | ICD-10-CM | POA: Diagnosis not present

## 2020-06-14 DIAGNOSIS — Z66 Do not resuscitate: Secondary | ICD-10-CM | POA: Diagnosis present

## 2020-06-14 DIAGNOSIS — J158 Pneumonia due to other specified bacteria: Secondary | ICD-10-CM | POA: Diagnosis not present

## 2020-06-14 DIAGNOSIS — J90 Pleural effusion, not elsewhere classified: Secondary | ICD-10-CM

## 2020-06-14 DIAGNOSIS — T8141XA Infection following a procedure, superficial incisional surgical site, initial encounter: Secondary | ICD-10-CM | POA: Diagnosis present

## 2020-06-14 DIAGNOSIS — L89151 Pressure ulcer of sacral region, stage 1: Secondary | ICD-10-CM | POA: Diagnosis not present

## 2020-06-14 DIAGNOSIS — J188 Other pneumonia, unspecified organism: Secondary | ICD-10-CM | POA: Diagnosis not present

## 2020-06-14 DIAGNOSIS — J9690 Respiratory failure, unspecified, unspecified whether with hypoxia or hypercapnia: Secondary | ICD-10-CM

## 2020-06-14 DIAGNOSIS — N183 Chronic kidney disease, stage 3 unspecified: Secondary | ICD-10-CM | POA: Diagnosis present

## 2020-06-14 DIAGNOSIS — M1990 Unspecified osteoarthritis, unspecified site: Secondary | ICD-10-CM | POA: Diagnosis present

## 2020-06-14 DIAGNOSIS — E785 Hyperlipidemia, unspecified: Secondary | ICD-10-CM | POA: Diagnosis present

## 2020-06-14 DIAGNOSIS — T827XXS Infection and inflammatory reaction due to other cardiac and vascular devices, implants and grafts, sequela: Secondary | ICD-10-CM

## 2020-06-14 DIAGNOSIS — A310 Pulmonary mycobacterial infection: Secondary | ICD-10-CM | POA: Diagnosis not present

## 2020-06-14 DIAGNOSIS — F17210 Nicotine dependence, cigarettes, uncomplicated: Secondary | ICD-10-CM | POA: Diagnosis present

## 2020-06-14 DIAGNOSIS — E876 Hypokalemia: Secondary | ICD-10-CM | POA: Diagnosis not present

## 2020-06-14 DIAGNOSIS — J9601 Acute respiratory failure with hypoxia: Secondary | ICD-10-CM

## 2020-06-14 DIAGNOSIS — Y838 Other surgical procedures as the cause of abnormal reaction of the patient, or of later complication, without mention of misadventure at the time of the procedure: Secondary | ICD-10-CM | POA: Diagnosis not present

## 2020-06-14 DIAGNOSIS — Z20822 Contact with and (suspected) exposure to covid-19: Secondary | ICD-10-CM | POA: Diagnosis present

## 2020-06-14 DIAGNOSIS — I5033 Acute on chronic diastolic (congestive) heart failure: Secondary | ICD-10-CM | POA: Diagnosis present

## 2020-06-14 DIAGNOSIS — R911 Solitary pulmonary nodule: Secondary | ICD-10-CM | POA: Diagnosis present

## 2020-06-14 DIAGNOSIS — D62 Acute posthemorrhagic anemia: Secondary | ICD-10-CM | POA: Diagnosis not present

## 2020-06-14 DIAGNOSIS — R0603 Acute respiratory distress: Secondary | ICD-10-CM | POA: Diagnosis not present

## 2020-06-14 DIAGNOSIS — J9602 Acute respiratory failure with hypercapnia: Secondary | ICD-10-CM

## 2020-06-14 DIAGNOSIS — Z4682 Encounter for fitting and adjustment of non-vascular catheter: Secondary | ICD-10-CM

## 2020-06-14 DIAGNOSIS — I130 Hypertensive heart and chronic kidney disease with heart failure and stage 1 through stage 4 chronic kidney disease, or unspecified chronic kidney disease: Secondary | ICD-10-CM | POA: Diagnosis present

## 2020-06-14 DIAGNOSIS — R0600 Dyspnea, unspecified: Secondary | ICD-10-CM

## 2020-06-14 DIAGNOSIS — R209 Unspecified disturbances of skin sensation: Secondary | ICD-10-CM | POA: Diagnosis not present

## 2020-06-14 DIAGNOSIS — G9341 Metabolic encephalopathy: Secondary | ICD-10-CM | POA: Diagnosis not present

## 2020-06-14 DIAGNOSIS — E1122 Type 2 diabetes mellitus with diabetic chronic kidney disease: Secondary | ICD-10-CM | POA: Diagnosis present

## 2020-06-14 DIAGNOSIS — L8990 Pressure ulcer of unspecified site, unspecified stage: Secondary | ICD-10-CM | POA: Insufficient documentation

## 2020-06-14 DIAGNOSIS — R0689 Other abnormalities of breathing: Secondary | ICD-10-CM | POA: Diagnosis not present

## 2020-06-14 DIAGNOSIS — E669 Obesity, unspecified: Secondary | ICD-10-CM | POA: Diagnosis present

## 2020-06-14 DIAGNOSIS — Z9889 Other specified postprocedural states: Secondary | ICD-10-CM | POA: Diagnosis not present

## 2020-06-14 DIAGNOSIS — M79609 Pain in unspecified limb: Secondary | ICD-10-CM | POA: Diagnosis not present

## 2020-06-14 DIAGNOSIS — E1152 Type 2 diabetes mellitus with diabetic peripheral angiopathy with gangrene: Secondary | ICD-10-CM | POA: Diagnosis present

## 2020-06-14 DIAGNOSIS — T827XXA Infection and inflammatory reaction due to other cardiac and vascular devices, implants and grafts, initial encounter: Principal | ICD-10-CM | POA: Diagnosis present

## 2020-06-14 DIAGNOSIS — M1A0620 Idiopathic chronic gout, left knee, without tophus (tophi): Secondary | ICD-10-CM | POA: Diagnosis present

## 2020-06-14 DIAGNOSIS — Z7982 Long term (current) use of aspirin: Secondary | ICD-10-CM

## 2020-06-14 DIAGNOSIS — Z7984 Long term (current) use of oral hypoglycemic drugs: Secondary | ICD-10-CM

## 2020-06-14 DIAGNOSIS — F410 Panic disorder [episodic paroxysmal anxiety] without agoraphobia: Secondary | ICD-10-CM | POA: Diagnosis not present

## 2020-06-14 DIAGNOSIS — Z8489 Family history of other specified conditions: Secondary | ICD-10-CM

## 2020-06-14 DIAGNOSIS — R9389 Abnormal findings on diagnostic imaging of other specified body structures: Secondary | ICD-10-CM | POA: Diagnosis not present

## 2020-06-14 DIAGNOSIS — T829XXA Unspecified complication of cardiac and vascular prosthetic device, implant and graft, initial encounter: Secondary | ICD-10-CM

## 2020-06-14 DIAGNOSIS — D72829 Elevated white blood cell count, unspecified: Secondary | ICD-10-CM

## 2020-06-14 DIAGNOSIS — Z79899 Other long term (current) drug therapy: Secondary | ICD-10-CM

## 2020-06-14 DIAGNOSIS — T82868A Thrombosis of vascular prosthetic devices, implants and grafts, initial encounter: Secondary | ICD-10-CM | POA: Diagnosis not present

## 2020-06-14 DIAGNOSIS — E1165 Type 2 diabetes mellitus with hyperglycemia: Secondary | ICD-10-CM | POA: Diagnosis not present

## 2020-06-14 DIAGNOSIS — R339 Retention of urine, unspecified: Secondary | ICD-10-CM | POA: Diagnosis not present

## 2020-06-14 HISTORY — PX: FEMORAL-FEMORAL BYPASS GRAFT: SHX936

## 2020-06-14 HISTORY — PX: WOUND DEBRIDEMENT: SHX247

## 2020-06-14 LAB — POCT I-STAT, CHEM 8
BUN: 13 mg/dL (ref 8–23)
Calcium, Ion: 1.1 mmol/L — ABNORMAL LOW (ref 1.15–1.40)
Chloride: 102 mmol/L (ref 98–111)
Creatinine, Ser: 0.5 mg/dL (ref 0.44–1.00)
Glucose, Bld: 141 mg/dL — ABNORMAL HIGH (ref 70–99)
HCT: 23 % — ABNORMAL LOW (ref 36.0–46.0)
Hemoglobin: 7.8 g/dL — ABNORMAL LOW (ref 12.0–15.0)
Potassium: 4.5 mmol/L (ref 3.5–5.1)
Sodium: 135 mmol/L (ref 135–145)
TCO2: 27 mmol/L (ref 22–32)

## 2020-06-14 LAB — GLUCOSE, CAPILLARY
Glucose-Capillary: 145 mg/dL — ABNORMAL HIGH (ref 70–99)
Glucose-Capillary: 130 mg/dL — ABNORMAL HIGH (ref 70–99)
Glucose-Capillary: 126 mg/dL — ABNORMAL HIGH (ref 70–99)

## 2020-06-14 LAB — SARS CORONAVIRUS 2 BY RT PCR (HOSPITAL ORDER, PERFORMED IN ~~LOC~~ HOSPITAL LAB): SARS Coronavirus 2: NEGATIVE

## 2020-06-14 LAB — PREPARE RBC (CROSSMATCH)

## 2020-06-14 SURGERY — DEBRIDEMENT, WOUND
Anesthesia: General | Site: Groin | Laterality: Right

## 2020-06-14 MED ORDER — UMECLIDINIUM BROMIDE 62.5 MCG/INH IN AEPB
1.0000 | INHALATION_SPRAY | Freq: Every day | RESPIRATORY_TRACT | Status: DC
  Filled 2020-06-14: qty 7

## 2020-06-14 MED ORDER — ONDANSETRON HCL 4 MG/2ML IJ SOLN
INTRAMUSCULAR | Status: AC
  Filled 2020-06-14: qty 2

## 2020-06-14 MED ORDER — PROPOFOL 10 MG/ML IV BOLUS
INTRAVENOUS | Status: DC | PRN
  Administered 2020-06-14: 40 mg via INTRAVENOUS
  Administered 2020-06-14: 120 mg via INTRAVENOUS

## 2020-06-14 MED ORDER — LIDOCAINE 2% (20 MG/ML) 5 ML SYRINGE
INTRAMUSCULAR | Status: AC
  Filled 2020-06-14: qty 5

## 2020-06-14 MED ORDER — PROPOFOL 10 MG/ML IV BOLUS
INTRAVENOUS | Status: AC
  Filled 2020-06-14: qty 20

## 2020-06-14 MED ORDER — METOPROLOL TARTRATE 5 MG/5ML IV SOLN: 2.0000 mg | INTRAVENOUS | Status: DC | PRN

## 2020-06-14 MED ORDER — ACETAMINOPHEN 325 MG PO TABS: 325.0000 mg | ORAL_TABLET | ORAL | Status: DC | PRN

## 2020-06-14 MED ORDER — ACETAMINOPHEN 650 MG RE SUPP
325.0000 mg | RECTAL | Status: DC | PRN
Start: 2020-06-14 — End: 2020-06-14

## 2020-06-14 MED ORDER — 0.9 % SODIUM CHLORIDE (POUR BTL) OPTIME
TOPICAL | Status: DC | PRN
  Administered 2020-06-14: 15:00:00 1000 mL

## 2020-06-14 MED ORDER — BISACODYL 10 MG RE SUPP: 10.0000 mg | Freq: Every day | RECTAL | Status: DC | PRN

## 2020-06-14 MED ORDER — PANTOPRAZOLE SODIUM 40 MG PO TBEC
40.0000 mg | DELAYED_RELEASE_TABLET | Freq: Every day | ORAL | Status: DC
  Administered 2020-06-15: 11:00:00 40 mg via ORAL
  Filled 2020-06-14: qty 1

## 2020-06-14 MED ORDER — PIPERACILLIN-TAZOBACTAM 3.375 G IVPB
3.3750 g | Freq: Three times a day (TID) | INTRAVENOUS | Status: DC
  Administered 2020-06-14 – 2020-06-19 (×12): 3.375 g via INTRAVENOUS
  Filled 2020-06-14 (×12): qty 50

## 2020-06-14 MED ORDER — ACETAMINOPHEN 325 MG PO TABS
650.0000 mg | ORAL_TABLET | Freq: Four times a day (QID) | ORAL | Status: DC
  Administered 2020-06-14 – 2020-07-03 (×57): 650 mg via ORAL
  Filled 2020-06-14 (×57): qty 2

## 2020-06-14 MED ORDER — ADULT MULTIVITAMIN W/MINERALS CH
1.0000 | ORAL_TABLET | Freq: Every day | ORAL | Status: DC
  Administered 2020-06-15 – 2020-07-02 (×16): 1 via ORAL
  Filled 2020-06-14 (×16): qty 1

## 2020-06-14 MED ORDER — SODIUM CHLORIDE 0.9 % IV SOLN: 500.0000 mL | Freq: Once | INTRAVENOUS | Status: DC | PRN

## 2020-06-14 MED ORDER — CEFAZOLIN SODIUM-DEXTROSE 2-4 GM/100ML-% IV SOLN
2.0000 g | INTRAVENOUS | Status: AC
  Administered 2020-06-14: 14:00:00 2 g via INTRAVENOUS
  Filled 2020-06-14: qty 100

## 2020-06-14 MED ORDER — CHLORHEXIDINE GLUCONATE 4 % EX LIQD: 60.0000 mL | Freq: Once | CUTANEOUS | Status: DC

## 2020-06-14 MED ORDER — PHENOL 1.4 % MT LIQD
1.0000 | OROMUCOSAL | Status: DC | PRN
Start: 2020-06-14 — End: 2020-06-16

## 2020-06-14 MED ORDER — OXYCODONE-ACETAMINOPHEN 5-325 MG PO TABS: 1.0000 | ORAL_TABLET | ORAL | Status: DC | PRN

## 2020-06-14 MED ORDER — VANCOMYCIN HCL IN DEXTROSE 1-5 GM/200ML-% IV SOLN
1000.0000 mg | Freq: Two times a day (BID) | INTRAVENOUS | Status: DC
  Filled 2020-06-14: qty 200

## 2020-06-14 MED ORDER — TIOTROPIUM BROMIDE MONOHYDRATE 1.25 MCG/ACT IN AERS: 2.0000 | INHALATION_SPRAY | Freq: Every day | RESPIRATORY_TRACT | Status: DC

## 2020-06-14 MED ORDER — MORPHINE SULFATE (PF) 2 MG/ML IV SOLN: 2.0000 mg | INTRAVENOUS | Status: DC | PRN

## 2020-06-14 MED ORDER — FENTANYL CITRATE (PF) 100 MCG/2ML IJ SOLN
INTRAMUSCULAR | Status: DC | PRN
  Administered 2020-06-14 (×4): 25 ug via INTRAVENOUS
  Administered 2020-06-14: 100 ug via INTRAVENOUS
  Administered 2020-06-14 (×2): 25 ug via INTRAVENOUS

## 2020-06-14 MED ORDER — ROCURONIUM BROMIDE 10 MG/ML (PF) SYRINGE
PREFILLED_SYRINGE | INTRAVENOUS | Status: AC
  Filled 2020-06-14: qty 10

## 2020-06-14 MED ORDER — HYDROMORPHONE HCL 1 MG/ML IJ SOLN: 0.2500 mg | INTRAMUSCULAR | Status: DC | PRN

## 2020-06-14 MED ORDER — ACETAMINOPHEN 10 MG/ML IV SOLN: 1000.0000 mg | Freq: Once | INTRAVENOUS | Status: DC | PRN

## 2020-06-14 MED ORDER — VITAMIN D 25 MCG (1000 UNIT) PO TABS
1000.0000 [IU] | ORAL_TABLET | Freq: Every day | ORAL | Status: DC
  Administered 2020-06-14 – 2020-07-02 (×17): 1000 [IU] via ORAL
  Filled 2020-06-14 (×18): qty 1

## 2020-06-14 MED ORDER — ONDANSETRON HCL 4 MG/2ML IJ SOLN: 4.0000 mg | Freq: Once | INTRAMUSCULAR | Status: DC | PRN

## 2020-06-14 MED ORDER — LABETALOL HCL 5 MG/ML IV SOLN
10.0000 mg | INTRAVENOUS | Status: AC | PRN
  Administered 2020-06-16 (×4): 10 mg via INTRAVENOUS
  Filled 2020-06-14 (×3): qty 4

## 2020-06-14 MED ORDER — MAGNESIUM SULFATE 2 GM/50ML IV SOLN
2.0000 g | Freq: Every day | INTRAVENOUS | Status: AC | PRN
  Administered 2020-06-16: 2 g via INTRAVENOUS
  Filled 2020-06-14: qty 50

## 2020-06-14 MED ORDER — OXYCODONE HCL 5 MG PO TABS: 5.0000 mg | ORAL_TABLET | ORAL | Status: DC | PRN

## 2020-06-14 MED ORDER — SODIUM CHLORIDE 0.9 % IV SOLN: INTRAVENOUS | Status: DC | PRN

## 2020-06-14 MED ORDER — HYDRALAZINE HCL 20 MG/ML IJ SOLN
5.0000 mg | INTRAMUSCULAR | Status: DC | PRN
Start: 2020-06-14 — End: 2020-06-16
  Filled 2020-06-14 (×2): qty 1

## 2020-06-14 MED ORDER — CHLORHEXIDINE GLUCONATE 0.12 % MT SOLN
15.0000 mL | Freq: Once | OROMUCOSAL | Status: AC
  Filled 2020-06-14: qty 15

## 2020-06-14 MED ORDER — EMPAGLIFLOZIN 25 MG PO TABS
25.0000 mg | ORAL_TABLET | Freq: Every day | ORAL | Status: DC
  Filled 2020-06-14: qty 1

## 2020-06-14 MED ORDER — DOCUSATE SODIUM 100 MG PO CAPS
100.0000 mg | ORAL_CAPSULE | Freq: Every day | ORAL | Status: DC
  Administered 2020-06-15: 11:00:00 100 mg via ORAL
  Filled 2020-06-14: qty 1

## 2020-06-14 MED ORDER — ASPIRIN EC 81 MG PO TBEC
81.0000 mg | DELAYED_RELEASE_TABLET | Freq: Every day | ORAL | Status: DC
  Administered 2020-06-15: 08:00:00 81 mg via ORAL
  Filled 2020-06-14: qty 1

## 2020-06-14 MED ORDER — SODIUM CHLORIDE 0.9 % IV SOLN: INTRAVENOUS | Status: DC

## 2020-06-14 MED ORDER — VANCOMYCIN HCL 1500 MG/300ML IV SOLN: 1500.0000 mg | INTRAVENOUS | Status: DC

## 2020-06-14 MED ORDER — MIDAZOLAM HCL 2 MG/2ML IJ SOLN
INTRAMUSCULAR | Status: AC
  Filled 2020-06-14: qty 2

## 2020-06-14 MED ORDER — METFORMIN HCL 500 MG PO TABS
1000.0000 mg | ORAL_TABLET | Freq: Two times a day (BID) | ORAL | Status: DC
  Administered 2020-06-15: 08:00:00 1000 mg via ORAL
  Filled 2020-06-14: qty 2

## 2020-06-14 MED ORDER — GLUCERNA SHAKE PO LIQD
237.0000 mL | Freq: Three times a day (TID) | ORAL | Status: DC
  Administered 2020-06-14: 20:00:00 237 mL via ORAL

## 2020-06-14 MED ORDER — POTASSIUM CHLORIDE CRYS ER 20 MEQ PO TBCR: 20.0000 meq | EXTENDED_RELEASE_TABLET | Freq: Every day | ORAL | Status: DC | PRN

## 2020-06-14 MED ORDER — CHLORHEXIDINE GLUCONATE 0.12 % MT SOLN
OROMUCOSAL | Status: AC
  Administered 2020-06-14: 10:00:00 15 mL via OROMUCOSAL
  Filled 2020-06-14: qty 15

## 2020-06-14 MED ORDER — SUGAMMADEX SODIUM 200 MG/2ML IV SOLN
INTRAVENOUS | Status: DC | PRN
  Administered 2020-06-14: 200 mg via INTRAVENOUS

## 2020-06-14 MED ORDER — GUAIFENESIN-DM 100-10 MG/5ML PO SYRP: 15.0000 mL | ORAL_SOLUTION | ORAL | Status: DC | PRN

## 2020-06-14 MED ORDER — SODIUM CHLORIDE 0.9 % IV SOLN
INTRAVENOUS | Status: AC
  Filled 2020-06-14: qty 1.2

## 2020-06-14 MED ORDER — POLYETHYLENE GLYCOL 3350 17 G PO PACK: 17.0000 g | PACK | Freq: Every day | ORAL | Status: DC | PRN

## 2020-06-14 MED ORDER — ONDANSETRON HCL 4 MG/2ML IJ SOLN
INTRAMUSCULAR | Status: DC | PRN
  Administered 2020-06-14: 4 mg via INTRAVENOUS

## 2020-06-14 MED ORDER — MIDAZOLAM HCL 5 MG/5ML IJ SOLN
INTRAMUSCULAR | Status: DC | PRN
  Administered 2020-06-14: 2 mg via INTRAVENOUS

## 2020-06-14 MED ORDER — MORPHINE SULFATE (PF) 4 MG/ML IV SOLN: 4.0000 mg | INTRAVENOUS | Status: DC | PRN

## 2020-06-14 MED ORDER — ATORVASTATIN CALCIUM 40 MG PO TABS
40.0000 mg | ORAL_TABLET | Freq: Every day | ORAL | Status: DC
  Administered 2020-06-15: 11:00:00 40 mg via ORAL
  Filled 2020-06-14: qty 1

## 2020-06-14 MED ORDER — ONDANSETRON HCL 4 MG/2ML IJ SOLN: 4.0000 mg | Freq: Four times a day (QID) | INTRAMUSCULAR | Status: DC | PRN

## 2020-06-14 MED ORDER — FENTANYL CITRATE (PF) 250 MCG/5ML IJ SOLN
INTRAMUSCULAR | Status: AC
  Filled 2020-06-14: qty 5

## 2020-06-14 MED ORDER — ALUM & MAG HYDROXIDE-SIMETH 200-200-20 MG/5ML PO SUSP: 15.0000 mL | ORAL | Status: DC | PRN

## 2020-06-14 MED ORDER — ROCURONIUM BROMIDE 10 MG/ML (PF) SYRINGE
PREFILLED_SYRINGE | INTRAVENOUS | Status: DC | PRN
  Administered 2020-06-14: 50 mg via INTRAVENOUS

## 2020-06-14 MED ORDER — LIDOCAINE 2% (20 MG/ML) 5 ML SYRINGE
INTRAMUSCULAR | Status: DC | PRN
  Administered 2020-06-14: 60 mg via INTRAVENOUS

## 2020-06-14 MED ORDER — BENAZEPRIL HCL 5 MG PO TABS
10.0000 mg | ORAL_TABLET | Freq: Every day | ORAL | Status: DC
  Administered 2020-06-14 – 2020-06-15 (×2): 10 mg via ORAL
  Filled 2020-06-14 (×3): qty 2

## 2020-06-14 MED ORDER — VANCOMYCIN HCL 1750 MG/350ML IV SOLN
1750.0000 mg | Freq: Once | INTRAVENOUS | Status: AC
  Administered 2020-06-14: 18:00:00 1750 mg via INTRAVENOUS
  Filled 2020-06-14: qty 350

## 2020-06-14 SURGICAL SUPPLY — 52 items
BAG DECANTER FOR FLEXI CONT (MISCELLANEOUS) ×2 IMPLANT
BLADE SURG 11 STRL SS (BLADE) ×2 IMPLANT
BNDG ELASTIC 4X5.8 VLCR STR LF (GAUZE/BANDAGES/DRESSINGS) IMPLANT
BNDG ELASTIC 6X5.8 VLCR STR LF (GAUZE/BANDAGES/DRESSINGS) IMPLANT
BNDG GAUZE ELAST 4 BULKY (GAUZE/BANDAGES/DRESSINGS) ×2 IMPLANT
CANISTER SUCT 3000ML PPV (MISCELLANEOUS) ×2 IMPLANT
CLIP VESOCCLUDE MED 6/CT (CLIP) ×2 IMPLANT
CLIP VESOCCLUDE SM WIDE 6/CT (CLIP) ×2 IMPLANT
COVER SURGICAL LIGHT HANDLE (MISCELLANEOUS) ×2 IMPLANT
COVER WAND RF STERILE (DRAPES) ×2 IMPLANT
DRAPE EXTREMITY T 121X128X90 (DISPOSABLE) IMPLANT
DRAPE HALF SHEET 40X57 (DRAPES) IMPLANT
DRAPE INCISE IOBAN 66X45 STRL (DRAPES) IMPLANT
DRAPE ORTHO SPLIT 77X108 STRL (DRAPES) ×2
DRAPE SURG ORHT 6 SPLT 77X108 (DRAPES) ×1 IMPLANT
DRSG ADAPTIC 3X8 NADH LF (GAUZE/BANDAGES/DRESSINGS) IMPLANT
DRSG PAD ABDOMINAL 8X10 ST (GAUZE/BANDAGES/DRESSINGS) ×4 IMPLANT
ELECT REM PT RETURN 9FT ADLT (ELECTROSURGICAL) ×2
ELECTRODE REM PT RTRN 9FT ADLT (ELECTROSURGICAL) ×1 IMPLANT
GAUZE SPONGE 4X4 12PLY STRL LF (GAUZE/BANDAGES/DRESSINGS) ×2 IMPLANT
GLOVE BIO SURGEON STRL SZ7.5 (GLOVE) ×2 IMPLANT
GOWN STRL REUS W/ TWL LRG LVL3 (GOWN DISPOSABLE) ×2 IMPLANT
GOWN STRL REUS W/ TWL XL LVL3 (GOWN DISPOSABLE) ×1 IMPLANT
GOWN STRL REUS W/TWL LRG LVL3 (GOWN DISPOSABLE) ×4
GOWN STRL REUS W/TWL XL LVL3 (GOWN DISPOSABLE) ×1
GRAFT GORETEX STND 6X20 (Vascular Products) ×2 IMPLANT
GRAFT GORETEXSTD 6X20 (Vascular Products) ×1 IMPLANT
HANDPIECE INTERPULSE COAX TIP (DISPOSABLE) ×2
KIT BASIN OR (CUSTOM PROCEDURE TRAY) ×2 IMPLANT
KIT TURNOVER KIT B (KITS) ×2 IMPLANT
MARKER SKIN DUAL TIP RULER LAB (MISCELLANEOUS) ×2 IMPLANT
NS IRRIG 1000ML POUR BTL (IV SOLUTION) ×2 IMPLANT
PACK GENERAL/GYN (CUSTOM PROCEDURE TRAY) IMPLANT
PAD ABD 8X10 STRL (GAUZE/BANDAGES/DRESSINGS) ×4 IMPLANT
PAD ARMBOARD 7.5X6 YLW CONV (MISCELLANEOUS) ×4 IMPLANT
SET HNDPC FAN SPRY TIP SCT (DISPOSABLE) ×1 IMPLANT
SIZER BREAST GEL REUSE 480CC (SIZER) ×2
SIZER BRST GEL REUSE 480CC (SIZER) ×1 IMPLANT
SPONGE LAP 18X18 RF (DISPOSABLE) ×2 IMPLANT
SUT ETHILON 3 0 PS 1 (SUTURE) IMPLANT
SUT PROLENE 5 0 C 1 24 (SUTURE) ×2 IMPLANT
SUT PROLENE 6 0 BV (SUTURE) ×8 IMPLANT
SUT VIC AB 2-0 CT1 27 (SUTURE)
SUT VIC AB 2-0 CT1 TAPERPNT 27 (SUTURE) IMPLANT
SUT VIC AB 3-0 SH 27 (SUTURE)
SUT VIC AB 3-0 SH 27X BRD (SUTURE) IMPLANT
SUT VICRYL 4-0 PS2 18IN ABS (SUTURE) IMPLANT
SWAB COLLECTION DEVICE MRSA (MISCELLANEOUS) ×2 IMPLANT
SYR 50ML LL SCALE MARK (SYRINGE) ×2 IMPLANT
TOWEL GREEN STERILE (TOWEL DISPOSABLE) ×4 IMPLANT
TOWEL GREEN STERILE FF (TOWEL DISPOSABLE) ×2 IMPLANT
WATER STERILE IRR 1000ML POUR (IV SOLUTION) ×2 IMPLANT

## 2020-06-14 NOTE — Anesthesia Postprocedure Evaluation (Signed)
Anesthesia Post Note  Patient: Jessamy Torosyan  Procedure(s) Performed: RIGHT GROIN DEBRIDEMENT with pulsatile jet lavage of 11cm x 4cm x 4cm wound (Right Groin) interposition FEMORAL-FEMORAL ARTERY bypass using 25mm graft (Right Groin)     Patient location during evaluation: PACU Anesthesia Type: General Level of consciousness: awake and alert Pain management: pain level controlled Vital Signs Assessment: post-procedure vital signs reviewed and stable Respiratory status: spontaneous breathing, nonlabored ventilation, respiratory function stable and patient connected to nasal cannula oxygen Cardiovascular status: blood pressure returned to baseline and stable Postop Assessment: no apparent nausea or vomiting Anesthetic complications: no   No complications documented.  Last Vitals:  Vitals:   06/14/20 1600 06/14/20 1619  BP: (!) 153/52 (!) 159/42  Pulse: 63 70  Resp: (!) 22 17  Temp:  37.3 C  SpO2: 97% 100%    Last Pain:  Vitals:   06/14/20 1619  TempSrc: Oral  PainSc:                  Nelle Don Amberlyn Martinezgarcia

## 2020-06-14 NOTE — Interval H&P Note (Signed)
History and Physical Interval Note:  06/14/2020 12:33 PM  Holly Stevenson  has presented today for surgery, with the diagnosis of WOUND INFECTION.  The various methods of treatment have been discussed with the patient and family. After consideration of risks, benefits and other options for treatment, the patient has consented to  Procedure(s): RIGHT GROIN DEBRIDEMENT (Right) as a surgical intervention.  The patient's history has been reviewed, patient examined, no change in status, stable for surgery.  I have reviewed the patient's chart and labs.  Questions were answered to the patient's satisfaction.     Lemar Livings

## 2020-06-14 NOTE — Transfer of Care (Signed)
Immediate Anesthesia Transfer of Care Note  Patient: Holly Stevenson  Procedure(s) Performed: RIGHT GROIN DEBRIDEMENT with pulsatile jet lavage of 11cm x 4cm x 4cm wound (Right Groin) interposition FEMORAL-FEMORAL ARTERY bypass using 52mm graft (Right Groin)  Patient Location: PACU  Anesthesia Type:General  Level of Consciousness: sedated and patient cooperative  Airway & Oxygen Therapy: Patient Spontanous Breathing and Patient connected to face mask oxygen  Post-op Assessment: Report given to RN, Post -op Vital signs reviewed and stable and Patient moving all extremities  Post vital signs: Reviewed and stable  Last Vitals:  Vitals Value Taken Time  BP 145/46 06/14/20 1500  Temp    Pulse 65 06/14/20 1503  Resp 22 06/14/20 1503  SpO2 100 % 06/14/20 1503  Vitals shown include unvalidated device data.  Last Pain:  Vitals:   06/14/20 0937  TempSrc:   PainSc: 5       Patients Stated Pain Goal: 3 (06/14/20 1937)  Complications: No complications documented.

## 2020-06-14 NOTE — Anesthesia Preprocedure Evaluation (Signed)
Anesthesia Evaluation  Patient identified by MRN, date of birth, ID band Patient awake    Reviewed: NPO status , Patient's Chart, lab work & pertinent test results  Airway Mallampati: II  TM Distance: >3 FB Neck ROM: Full    Dental  (+) Teeth Intact   Pulmonary COPD,  COPD inhaler, Current Smoker and Patient abstained from smoking.,     + decreased breath sounds      Cardiovascular hypertension, Pt. on medications + Peripheral Vascular Disease (s/p fem-fem/fem-pop bypass 11/24 with wound infection)   Rhythm:Regular Rate:Normal     Neuro/Psych negative psych ROS   GI/Hepatic negative GI ROS, Neg liver ROS,   Endo/Other  diabetes, Well Controlled, Type 2, Oral Hypoglycemic Agents  Renal/GU negative Renal ROS  negative genitourinary   Musculoskeletal  (+) Arthritis ,   Abdominal (+)  Abdomen: soft. Bowel sounds: normal.  Peds  Hematology negative hematology ROS (+)   Anesthesia Other Findings   Reproductive/Obstetrics                             Anesthesia Physical Anesthesia Plan  ASA: III  Anesthesia Plan: General   Post-op Pain Management:    Induction: Intravenous  PONV Risk Score and Plan: 2 and Ondansetron, Dexamethasone and Treatment may vary due to age or medical condition  Airway Management Planned: Mask and Oral ETT  Additional Equipment: None  Intra-op Plan:   Post-operative Plan: Extubation in OR  Informed Consent: I have reviewed the patients History and Physical, chart, labs and discussed the procedure including the risks, benefits and alternatives for the proposed anesthesia with the patient or authorized representative who has indicated his/her understanding and acceptance.     Dental advisory given  Plan Discussed with: CRNA  Anesthesia Plan Comments: (Lab Results      Component                Value               Date                      WBC                       8.1                 06/07/2020                HGB                      7.8 (L)             06/14/2020                HCT                      23.0 (L)            06/14/2020                MCV                      94.6                06/07/2020                PLT  218                 06/07/2020           Lab Results      Component                Value               Date                      NA                       135                 06/14/2020                K                        4.5                 06/14/2020                CO2                      23                  06/05/2020                GLUCOSE                  141 (H)             06/14/2020                BUN                      13                  06/14/2020                CREATININE               0.50                06/14/2020                CALCIUM                  7.5 (L)             06/05/2020                GFRNONAA                 >60                 06/05/2020                GFRAA                    52 (L)              02/18/2018          )        Anesthesia Quick Evaluation

## 2020-06-14 NOTE — Progress Notes (Signed)
Greatly appreciate Dr. Darcella Cheshire assistance today. Operative findings reviewed with him in detail. Gross purulence discovered and with another graft dehiscence concerning for graft colonization with virulent organism. Needs excision of all prosthetic vascular material to clear infection definitively. We will plan to do this in the operating room tomorrow. Will plan to patch angioplasty the areas of graft anastomosis with autogenous vein. If patient is tolerating this well will then try to recanalize the left lower extremity endovascularly. If unable to recanalize endovascularly, we may be able to do a redo femoropopliteal bypass with cadaveric vein. Patient will need large-bore venous access, arterial line, Foley catheter for the operation.  I have crossmatched her 4 units of blood.  Continue broad-spectrum IV antibiotics. Okay for diet tonight.  N.p.o. past midnight. Order for consent written.  Reviewed plan in detail with daughter and patient.  Will review again in the morning. Keep dressing to groin intact.  Do not change.  If any bleeding please call vascular team on call. Pending saphenous vein mapping for case planning purposes.  Rande Brunt. Lenell Antu, MD [facility] Phone Number: [phone] 06/14/2020 6:14 PM

## 2020-06-14 NOTE — Progress Notes (Signed)
Pharmacy Antibiotic Note  Holly Stevenson is a 69 y.o. female admitted on 06/14/2020 with right groin wound infection.  Presented 06/14/20 for surgery.  S/p  I& D R groin wound , graft of femorofemoral bypass 12/29. Pharmacy has been consulted for Vancomycin and Zosny dosing.  Preop 2g cefazolin given 12/29 @ 1338 Tc 99.1,  Scr 0.5.  Used Scr 1.0 due to age 65 yrs to calculate CrCl 58.4 ml/min   Plan: Vancomycin 1750 mg IV loading dose then 1500 mg q24h  (scr 0.5, used 1.0 , CrCl 58 ml/min, h/o CKD stage III) Zosyn 3.375 g iv q8h EI Monitor clinical progress, renal function. Check steady state vancomycin trough per protocol if needed.    Height: 5\' 8"  (172.7 cm) Weight: 81.6 kg (180 lb) IBW/kg (Calculated) : 63.9  Temp (24hrs), Avg:98.8 F (37.1 C), Min:98.3 F (36.8 C), Max:99.1 F (37.3 C)  Recent Labs  Lab 06/14/20 1003  CREATININE 0.50    Estimated Creatinine Clearance: 73.3 mL/min (by C-G formula based on SCr of 0.5 mg/dL).    No Known Allergies  Antimicrobials this admission: Vanc 12/29>> Zosyn 12/29>>   Dose adjustments this admission: n/a  Microbiology results: 12/29 COVID:  neg   Thank you for allowing pharmacy to be a part of this patient's care. 1/30, RPh Clinical Pharmacist Please check AMION for all [facility] phone numbers After 10:00 PM, call Main Pharmacy [phone] 06/14/2020 5:06 PM

## 2020-06-14 NOTE — Anesthesia Procedure Notes (Signed)
Procedure Name: Intubation Date/Time: 06/14/2020 1:34 PM Performed by: Moshe Salisbury, CRNA Pre-anesthesia Checklist: Patient identified, Emergency Drugs available, Suction available and Patient being monitored Patient Re-evaluated:Patient Re-evaluated prior to induction Oxygen Delivery Method: Circle System Utilized Preoxygenation: Pre-oxygenation with 100% oxygen Induction Type: IV induction Ventilation: Mask ventilation without difficulty Laryngoscope Size: Mac and 3 Grade View: Grade I Tube type: Oral Tube size: 7.5 mm Number of attempts: 1 Airway Equipment and Method: Stylet and Oral airway Placement Confirmation: ETT inserted through vocal cords under direct vision,  positive ETCO2 and breath sounds checked- equal and bilateral Secured at: 21 cm Tube secured with: Tape Dental Injury: Teeth and Oropharynx as per pre-operative assessment

## 2020-06-14 NOTE — Op Note (Addendum)
    Patient name: Holly Stevenson MRN: [id number] DOB: 1951/06/15 Sex: female  06/14/2020 Pre-operative Diagnosis: Right groin surgical wound infection Post-operative diagnosis:  Same Surgeon:  Apolinar Junes C. Randie Heinz, MD Assistant: Doreatha Massed, PA Procedure Performed: 1.  Sharp excisional debridement right groin wound to 11 x 4 x 4 cm 2.  Pulsavac irrigation right groin wound 3.  Interposition graft of femorofemoral bypass with 6 mm PTFE  Indications: 69 year old female with a history of a right to left femorofemoral bypass with left femoral above-knee pop bypass all done with PTFE.  She subsequently had dehiscence of her right groin femoral to femoral bypass graft and underwent interposition grafting of the PTFE.  She now has significant right groin wound with drainage and is indicated for the above-noted procedure.  An assistant was necessary for suction, retraction, assistant with anastomosis.  Findings: There was approximately 300 cc of purulence that was foul-smelling this was cultured.  We debrided her groin back to healthy tissue.  Unfortunately the previous interposition graft anastomosis dehisced and she required interposition grafting from the 2 grafts with 6 mm PTFE.  At completion there was good flow distally in the right lower extremity and the femorofemoral was patent and the left foot was warm.  The wound was packed with wet-to-dry dressing.  She will need excision of all of her PTFE with placement of vein patch angioplasty and consideration of endovascular means of revascularization.   Procedure:  The patient was identified in the holding area and taken to the operating where she is placed supine operative when general anesthesia was induced.  She was sterilely prepped and draped in the right groin usual fashion antibiotics were minister timeout was called.  We began by removing her existing sutures.  We dissected down through the previous Vicryl sutures.  Unfortunately we  encountered approximately 300 cc of saline.  While irrigating this our previous anastomosis did dehisce.  We were able to get a clamp on the proximal femorofemoral bypass as well as distally in the same wound.  We then I did not find it to any of the graft.  We flushed them both with heparinized saline and reclamped them.  They did not appear that they would be able to meet reanastomose given that the distal part was completely degenerated off of the proximal part.  We then performed pulse evacuation to a total of 3 L of saline and debrided back to healthy-appearing tissue.  We had sent cultures prior to this.  We then brought a 6 mm PTFE to the table sewed first of the distal aspect allowed backbleeding flushed heparinized saline reclamped and then sewed to the proximal aspect.  Prior to completion anastomosis will flushed all directions.  Upon completion there was good pulse throughout the femorofemoral bypass.  There was a good signal in the right common femoral artery distal to the anastomosis on the right.  The left foot was checked it was noted to be warm.  We thoroughly irrigated the wound then obtain hemostasis and packed with wet Kerlix.  She was then awakened from anesthesia having tolerated procedure well without immediate complication.  All counts were correct at completion.  EBL: 200 cc  Specimen: Aerobic anaerobic culture    Tamra Koos C. Randie Heinz, MD [facility] [place] Office: [phone] Pager: [phone]

## 2020-06-15 ENCOUNTER — Inpatient Hospital Stay (HOSPITAL_COMMUNITY): Payer: BC Managed Care – PPO | Admitting: Anesthesiology

## 2020-06-15 ENCOUNTER — Inpatient Hospital Stay (HOSPITAL_COMMUNITY): Payer: BC Managed Care – PPO | Admitting: Certified Registered Nurse Anesthetist

## 2020-06-15 ENCOUNTER — Inpatient Hospital Stay (HOSPITAL_COMMUNITY): Payer: BC Managed Care – PPO

## 2020-06-15 ENCOUNTER — Encounter (HOSPITAL_COMMUNITY)
Admission: AD | Disposition: A | Discharge status: Hospice | Payer: Self-pay | Source: Home / Self Care | Attending: Vascular Surgery

## 2020-06-15 ENCOUNTER — Encounter (HOSPITAL_COMMUNITY): Payer: Self-pay | Admitting: Vascular Surgery

## 2020-06-15 DIAGNOSIS — Z01810 Encounter for preprocedural cardiovascular examination: Secondary | ICD-10-CM | POA: Diagnosis not present

## 2020-06-15 HISTORY — PX: INSERTION OF ILIAC STENT: SHX6256

## 2020-06-15 HISTORY — PX: APPLICATION OF WOUND VAC: SHX5189

## 2020-06-15 HISTORY — PX: ENDARTERECTOMY FEMORAL: SHX5804

## 2020-06-15 HISTORY — PX: LOWER EXTREMITY ANGIOGRAM: SHX5508

## 2020-06-15 HISTORY — PX: THROMBECTOMY FEMORAL ARTERY: SHX6406

## 2020-06-15 HISTORY — PX: VEIN HARVEST: SHX6363

## 2020-06-15 HISTORY — PX: AORTOGRAM: SHX6300

## 2020-06-15 HISTORY — PX: REMOVAL OF GRAFT: SHX6361

## 2020-06-15 HISTORY — PX: ANGIOPLASTY: SHX39

## 2020-06-15 HISTORY — PX: FEMORAL-POPLITEAL BYPASS GRAFT: SHX937

## 2020-06-15 LAB — POCT I-STAT, CHEM 8
BUN: 12 mg/dL (ref 8–23)
BUN: 13 mg/dL (ref 8–23)
Calcium, Ion: 1.1 mmol/L — ABNORMAL LOW (ref 1.15–1.40)
Calcium, Ion: 1.08 mmol/L — ABNORMAL LOW (ref 1.15–1.40)
Chloride: 104 mmol/L (ref 98–111)
Chloride: 104 mmol/L (ref 98–111)
Creatinine, Ser: 0.5 mg/dL (ref 0.44–1.00)
Creatinine, Ser: 0.5 mg/dL (ref 0.44–1.00)
Glucose, Bld: 132 mg/dL — ABNORMAL HIGH (ref 70–99)
Glucose, Bld: 159 mg/dL — ABNORMAL HIGH (ref 70–99)
HCT: 23 % — ABNORMAL LOW (ref 36.0–46.0)
HCT: 27 % — ABNORMAL LOW (ref 36.0–46.0)
Hemoglobin: 7.8 g/dL — ABNORMAL LOW (ref 12.0–15.0)
Hemoglobin: 9.2 g/dL — ABNORMAL LOW (ref 12.0–15.0)
Potassium: 4.5 mmol/L (ref 3.5–5.1)
Potassium: 4.9 mmol/L (ref 3.5–5.1)
Sodium: 136 mmol/L (ref 135–145)
Sodium: 136 mmol/L (ref 135–145)
TCO2: 23 mmol/L (ref 22–32)
TCO2: 24 mmol/L (ref 22–32)

## 2020-06-15 LAB — POCT I-STAT 7, (LYTES, BLD GAS, ICA,H+H)
Acid-base deficit: 1 mmol/L (ref 0.0–2.0)
Acid-base deficit: 6 mmol/L — ABNORMAL HIGH (ref 0.0–2.0)
Acid-base deficit: 5 mmol/L — ABNORMAL HIGH (ref 0.0–2.0)
Bicarbonate: 24 mmol/L (ref 20.0–28.0)
Bicarbonate: 20.2 mmol/L (ref 20.0–28.0)
Bicarbonate: 21 mmol/L (ref 20.0–28.0)
Calcium, Ion: 1.08 mmol/L — ABNORMAL LOW (ref 1.15–1.40)
Calcium, Ion: 1.04 mmol/L — ABNORMAL LOW (ref 1.15–1.40)
Calcium, Ion: 0.81 mmol/L — CL (ref 1.15–1.40)
HCT: 22 % — ABNORMAL LOW (ref 36.0–46.0)
HCT: 21 % — ABNORMAL LOW (ref 36.0–46.0)
HCT: 21 % — ABNORMAL LOW (ref 36.0–46.0)
Hemoglobin: 7.5 g/dL — ABNORMAL LOW (ref 12.0–15.0)
Hemoglobin: 7.1 g/dL — ABNORMAL LOW (ref 12.0–15.0)
Hemoglobin: 7.1 g/dL — ABNORMAL LOW (ref 12.0–15.0)
O2 Saturation: 100 %
O2 Saturation: 100 %
O2 Saturation: 100 %
Patient temperature: 37.1
Patient temperature: 36.7
Patient temperature: 36.4
Potassium: 5.1 mmol/L (ref 3.5–5.1)
Potassium: 5.7 mmol/L — ABNORMAL HIGH (ref 3.5–5.1)
Potassium: 5.6 mmol/L — ABNORMAL HIGH (ref 3.5–5.1)
Sodium: 135 mmol/L (ref 135–145)
Sodium: 135 mmol/L (ref 135–145)
Sodium: 137 mmol/L (ref 135–145)
TCO2: 25 mmol/L (ref 22–32)
TCO2: 21 mmol/L — ABNORMAL LOW (ref 22–32)
TCO2: 22 mmol/L (ref 22–32)
pCO2 arterial: 42.8 mmHg (ref 32.0–48.0)
pCO2 arterial: 41.8 mmHg (ref 32.0–48.0)
pCO2 arterial: 43.3 mmHg (ref 32.0–48.0)
pH, Arterial: 7.356 (ref 7.350–7.450)
pH, Arterial: 7.29 — ABNORMAL LOW (ref 7.350–7.450)
pH, Arterial: 7.29 — ABNORMAL LOW (ref 7.350–7.450)
pO2, Arterial: 177 mmHg — ABNORMAL HIGH (ref 83.0–108.0)
pO2, Arterial: 190 mmHg — ABNORMAL HIGH (ref 83.0–108.0)
pO2, Arterial: 414 mmHg — ABNORMAL HIGH (ref 83.0–108.0)

## 2020-06-15 LAB — HEMOGLOBIN AND HEMATOCRIT, BLOOD
HCT: 23.6 % — ABNORMAL LOW (ref 36.0–46.0)
Hemoglobin: 7.8 g/dL — ABNORMAL LOW (ref 12.0–15.0)

## 2020-06-15 LAB — PREPARE RBC (CROSSMATCH)

## 2020-06-15 LAB — BASIC METABOLIC PANEL
Anion gap: 11 (ref 5–15)
BUN: 14 mg/dL (ref 8–23)
CO2: 22 mmol/L (ref 22–32)
Calcium: 7.7 mg/dL — ABNORMAL LOW (ref 8.9–10.3)
Chloride: 103 mmol/L (ref 98–111)
Creatinine, Ser: 0.81 mg/dL (ref 0.44–1.00)
GFR, Estimated: >60 mL/min (ref >60)
Glucose, Bld: 169 mg/dL — ABNORMAL HIGH (ref 70–99)
Potassium: 4.7 mmol/L (ref 3.5–5.1)
Sodium: 136 mmol/L (ref 135–145)

## 2020-06-15 LAB — CBC
HCT: 21.7 % — ABNORMAL LOW (ref 36.0–46.0)
Hemoglobin: 6.4 g/dL — CL (ref 12.0–15.0)
MCH: 28.3 pg (ref 26.0–34.0)
MCHC: 29.5 g/dL — ABNORMAL LOW (ref 30.0–36.0)
MCV: 96 fL (ref 80.0–100.0)
Platelets: 246 10*3/uL (ref 150–400)
RBC: 2.26 MIL/uL — ABNORMAL LOW (ref 3.87–5.11)
RDW: 15.5 % (ref 11.5–15.5)
WBC: 10.4 10*3/uL (ref 4.0–10.5)
nRBC: 0 % (ref 0.0–0.2)

## 2020-06-15 LAB — GLUCOSE, CAPILLARY
Glucose-Capillary: 117 mg/dL — ABNORMAL HIGH (ref 70–99)
Glucose-Capillary: 218 mg/dL — ABNORMAL HIGH (ref 70–99)

## 2020-06-15 SURGERY — THROMBECTOMY, ARTERY, FEMORAL
Anesthesia: General | Site: Leg Upper

## 2020-06-15 SURGERY — REMOVAL, GRAFT
Anesthesia: General | Site: Leg Upper | Laterality: Right

## 2020-06-15 MED ORDER — PROPOFOL 10 MG/ML IV BOLUS
INTRAVENOUS | Status: DC | PRN
  Administered 2020-06-15: 100 mg via INTRAVENOUS

## 2020-06-15 MED ORDER — ONDANSETRON HCL 4 MG/2ML IJ SOLN
INTRAMUSCULAR | Status: DC | PRN
  Administered 2020-06-15: 4 mg via INTRAVENOUS

## 2020-06-15 MED ORDER — HYDROMORPHONE HCL 1 MG/ML IJ SOLN
0.2500 mg | INTRAMUSCULAR | Status: DC | PRN
Start: 2020-06-15 — End: 2020-06-16
  Administered 2020-06-15 (×2): 0.5 mg via INTRAVENOUS

## 2020-06-15 MED ORDER — 0.9 % SODIUM CHLORIDE (POUR BTL) OPTIME
TOPICAL | Status: DC | PRN
  Administered 2020-06-15: 1000 mL
  Administered 2020-06-15: 2000 mL

## 2020-06-15 MED ORDER — HEMOSTATIC AGENTS (NO CHARGE) OPTIME
TOPICAL | Status: DC | PRN
  Administered 2020-06-15 (×2): 1 via TOPICAL

## 2020-06-15 MED ORDER — FENTANYL CITRATE (PF) 250 MCG/5ML IJ SOLN
INTRAMUSCULAR | Status: AC
  Filled 2020-06-15: qty 5

## 2020-06-15 MED ORDER — PROTAMINE SULFATE 10 MG/ML IV SOLN
INTRAVENOUS | Status: DC | PRN
  Administered 2020-06-15: 30 mg via INTRAVENOUS
  Administered 2020-06-15: 20 mg via INTRAVENOUS

## 2020-06-15 MED ORDER — LIDOCAINE 2% (20 MG/ML) 5 ML SYRINGE
INTRAMUSCULAR | Status: AC
  Filled 2020-06-15: qty 5

## 2020-06-15 MED ORDER — LACTATED RINGERS IV SOLN: INTRAVENOUS | Status: DC

## 2020-06-15 MED ORDER — ROCURONIUM BROMIDE 10 MG/ML (PF) SYRINGE
PREFILLED_SYRINGE | INTRAVENOUS | Status: DC | PRN
  Administered 2020-06-15: 10 mg via INTRAVENOUS
  Administered 2020-06-15: 20 mg via INTRAVENOUS
  Administered 2020-06-15: 60 mg via INTRAVENOUS
  Administered 2020-06-15: 20 mg via INTRAVENOUS
  Administered 2020-06-15 (×2): 10 mg via INTRAVENOUS
  Administered 2020-06-15 (×2): 20 mg via INTRAVENOUS

## 2020-06-15 MED ORDER — HEPARIN SODIUM (PORCINE) 1000 UNIT/ML IJ SOLN
INTRAMUSCULAR | Status: DC | PRN
  Administered 2020-06-15: 5000 [IU] via INTRAVENOUS
  Administered 2020-06-15: 8000 [IU] via INTRAVENOUS
  Administered 2020-06-16: 5000 [IU] via INTRAVENOUS
  Administered 2020-06-16: 3000 [IU] via INTRAVENOUS
  Administered 2020-06-16: 5000 [IU] via INTRAVENOUS

## 2020-06-15 MED ORDER — DEXAMETHASONE SODIUM PHOSPHATE 10 MG/ML IJ SOLN
INTRAMUSCULAR | Status: DC | PRN
  Administered 2020-06-15: 5 mg via INTRAVENOUS

## 2020-06-15 MED ORDER — IODIXANOL 320 MG/ML IV SOLN
INTRAVENOUS | Status: DC | PRN
  Administered 2020-06-15: 35 mL
  Administered 2020-06-16: 50 mL

## 2020-06-15 MED ORDER — SODIUM CHLORIDE 0.9% IV SOLUTION: Freq: Once | INTRAVENOUS | Status: AC

## 2020-06-15 MED ORDER — SODIUM CHLORIDE 0.9 % IV SOLN: INTRAVENOUS | Status: DC | PRN

## 2020-06-15 MED ORDER — HYDROMORPHONE HCL 1 MG/ML IJ SOLN
INTRAMUSCULAR | Status: AC
  Filled 2020-06-15: qty 1

## 2020-06-15 MED ORDER — ONDANSETRON HCL 4 MG/2ML IJ SOLN
INTRAMUSCULAR | Status: AC
  Filled 2020-06-15: qty 2

## 2020-06-15 MED ORDER — GLYCOPYRROLATE PF 0.2 MG/ML IJ SOSY
PREFILLED_SYRINGE | INTRAMUSCULAR | Status: DC | PRN
  Administered 2020-06-15 (×2): .1 mg via INTRAVENOUS

## 2020-06-15 MED ORDER — HEPARIN SODIUM (PORCINE) 1000 UNIT/ML IJ SOLN
INTRAMUSCULAR | Status: AC
  Filled 2020-06-15: qty 1

## 2020-06-15 MED ORDER — VANCOMYCIN HCL 1500 MG/300ML IV SOLN
1500.0000 mg | INTRAVENOUS | Status: DC
  Administered 2020-06-15 – 2020-06-18 (×4): 1500 mg via INTRAVENOUS
  Filled 2020-06-15 (×6): qty 300

## 2020-06-15 MED ORDER — SUGAMMADEX SODIUM 200 MG/2ML IV SOLN
INTRAVENOUS | Status: DC | PRN
  Administered 2020-06-15: 300 mg via INTRAVENOUS

## 2020-06-15 MED ORDER — SODIUM CHLORIDE 0.9 % IV SOLN
INTRAVENOUS | Status: DC | PRN
  Administered 2020-06-15: 500 mL

## 2020-06-15 MED ORDER — PIPERACILLIN-TAZOBACTAM 3.375 G IVPB 30 MIN
3.3750 g | INTRAVENOUS | Status: AC
  Administered 2020-06-15: 13:00:00 3.375 g via INTRAVENOUS
  Filled 2020-06-15: qty 50

## 2020-06-15 MED ORDER — PHENYLEPHRINE HCL-NACL 10-0.9 MG/250ML-% IV SOLN
INTRAVENOUS | Status: DC | PRN
  Administered 2020-06-15: 40 ug/min via INTRAVENOUS

## 2020-06-15 MED ORDER — HYDRALAZINE HCL 20 MG/ML IJ SOLN
INTRAMUSCULAR | Status: AC
  Filled 2020-06-15: qty 1

## 2020-06-15 MED ORDER — SODIUM CHLORIDE 0.9 % IV SOLN
INTRAVENOUS | Status: AC
  Filled 2020-06-15: qty 1.2

## 2020-06-15 MED ORDER — CHLORHEXIDINE GLUCONATE 0.12 % MT SOLN: 15.0000 mL | Freq: Once | OROMUCOSAL | Status: DC

## 2020-06-15 MED ORDER — GLYCOPYRROLATE PF 0.2 MG/ML IJ SOSY
PREFILLED_SYRINGE | INTRAMUSCULAR | Status: AC
  Filled 2020-06-15: qty 1

## 2020-06-15 MED ORDER — SODIUM CHLORIDE 0.9% IV SOLUTION
Freq: Once | INTRAVENOUS | Status: AC
  Administered 2020-06-15 (×2): 500 mL via INTRAVENOUS
  Administered 2020-06-16: 1000 mL via INTRAVENOUS
  Administered 2020-06-16: 800 mL via INTRAVENOUS

## 2020-06-15 MED ORDER — HEPARIN SODIUM (PORCINE) 1000 UNIT/ML IJ SOLN
INTRAMUSCULAR | Status: AC
  Filled 2020-06-15: qty 2

## 2020-06-15 MED ORDER — HEPARIN SODIUM (PORCINE) 1000 UNIT/ML IJ SOLN
INTRAMUSCULAR | Status: DC | PRN
  Administered 2020-06-15: 8000 [IU] via INTRAVENOUS
  Administered 2020-06-15: 5000 [IU] via INTRAVENOUS
  Administered 2020-06-15: 3000 [IU] via INTRAVENOUS
  Administered 2020-06-15 (×2): 5000 [IU] via INTRAVENOUS

## 2020-06-15 MED ORDER — PROPOFOL 10 MG/ML IV BOLUS
INTRAVENOUS | Status: DC | PRN
  Administered 2020-06-15: 120 mg via INTRAVENOUS

## 2020-06-15 MED ORDER — CHLORHEXIDINE GLUCONATE 0.12 % MT SOLN
OROMUCOSAL | Status: AC
  Filled 2020-06-15: qty 15

## 2020-06-15 MED ORDER — DEXAMETHASONE SODIUM PHOSPHATE 10 MG/ML IJ SOLN
INTRAMUSCULAR | Status: AC
  Filled 2020-06-15: qty 1

## 2020-06-15 MED ORDER — OXYCODONE HCL 5 MG/5ML PO SOLN: 5.0000 mg | Freq: Once | ORAL | Status: DC | PRN

## 2020-06-15 MED ORDER — MEPERIDINE HCL 25 MG/ML IJ SOLN
6.2500 mg | INTRAMUSCULAR | Status: DC | PRN
Start: 2020-06-15 — End: 2020-06-16

## 2020-06-15 MED ORDER — MIDAZOLAM HCL 2 MG/2ML IJ SOLN
INTRAMUSCULAR | Status: DC | PRN
  Administered 2020-06-15: 1 mg via INTRAVENOUS

## 2020-06-15 MED ORDER — ROCURONIUM BROMIDE 10 MG/ML (PF) SYRINGE
PREFILLED_SYRINGE | INTRAVENOUS | Status: AC
  Filled 2020-06-15: qty 10

## 2020-06-15 MED ORDER — SUCCINYLCHOLINE CHLORIDE 20 MG/ML IJ SOLN
INTRAMUSCULAR | Status: DC | PRN
  Administered 2020-06-15: 80 mg via INTRAVENOUS

## 2020-06-15 MED ORDER — PROPOFOL 10 MG/ML IV BOLUS
INTRAVENOUS | Status: AC
  Filled 2020-06-15: qty 20

## 2020-06-15 MED ORDER — OXYCODONE HCL 5 MG PO TABS: 5.0000 mg | ORAL_TABLET | Freq: Once | ORAL | Status: DC | PRN

## 2020-06-15 MED ORDER — FENTANYL CITRATE (PF) 100 MCG/2ML IJ SOLN
INTRAMUSCULAR | Status: DC | PRN
  Administered 2020-06-15 (×7): 50 ug via INTRAVENOUS
  Administered 2020-06-15: 100 ug via INTRAVENOUS

## 2020-06-15 MED ORDER — ORAL CARE MOUTH RINSE: 15.0000 mL | Freq: Once | OROMUCOSAL | Status: DC

## 2020-06-15 MED ORDER — 0.9 % SODIUM CHLORIDE (POUR BTL) OPTIME
TOPICAL | Status: DC | PRN
  Administered 2020-06-15: 2000 mL

## 2020-06-15 MED ORDER — INSULIN ASPART 100 UNIT/ML ~~LOC~~ SOLN
0.0000 [IU] | Freq: Three times a day (TID) | SUBCUTANEOUS | Status: DC
  Administered 2020-06-16 (×3): 3 [IU] via SUBCUTANEOUS
  Administered 2020-06-17 – 2020-06-19 (×7): 2 [IU] via SUBCUTANEOUS
  Administered 2020-06-19: 1 [IU] via SUBCUTANEOUS
  Administered 2020-06-20: 12:00:00 2 [IU] via SUBCUTANEOUS
  Administered 2020-06-20: 3 [IU] via SUBCUTANEOUS
  Administered 2020-06-20 – 2020-06-21 (×2): 2 [IU] via SUBCUTANEOUS
  Administered 2020-06-21: 3 [IU] via SUBCUTANEOUS
  Administered 2020-06-21 – 2020-06-22 (×2): 2 [IU] via SUBCUTANEOUS
  Administered 2020-06-22 – 2020-06-23 (×5): 3 [IU] via SUBCUTANEOUS
  Administered 2020-06-24 – 2020-06-25 (×4): 2 [IU] via SUBCUTANEOUS
  Administered 2020-06-25: 12:00:00 1 [IU] via SUBCUTANEOUS
  Administered 2020-06-25 – 2020-06-27 (×6): 3 [IU] via SUBCUTANEOUS
  Administered 2020-06-27 – 2020-06-28 (×2): 2 [IU] via SUBCUTANEOUS
  Administered 2020-06-28: 5 [IU] via SUBCUTANEOUS
  Administered 2020-06-28 – 2020-06-30 (×7): 3 [IU] via SUBCUTANEOUS
  Administered 2020-07-01: 5 [IU] via SUBCUTANEOUS
  Administered 2020-07-01: 7 [IU] via SUBCUTANEOUS
  Administered 2020-07-01 – 2020-07-02 (×2): 5 [IU] via SUBCUTANEOUS
  Administered 2020-07-02: 7 [IU] via SUBCUTANEOUS
  Administered 2020-07-02 – 2020-07-03 (×2): 3 [IU] via SUBCUTANEOUS
  Administered 2020-07-03: 5 [IU] via SUBCUTANEOUS
  Administered 2020-07-03 – 2020-07-04 (×2): 2 [IU] via SUBCUTANEOUS
  Administered 2020-07-04: 5 [IU] via SUBCUTANEOUS
  Administered 2020-07-04: 3 [IU] via SUBCUTANEOUS
  Administered 2020-07-05: 5 [IU] via SUBCUTANEOUS

## 2020-06-15 MED ORDER — SODIUM CHLORIDE 0.9% IV SOLUTION: Freq: Once | INTRAVENOUS | Status: DC

## 2020-06-15 MED ORDER — LIDOCAINE HCL (CARDIAC) PF 100 MG/5ML IV SOSY
PREFILLED_SYRINGE | INTRAVENOUS | Status: DC | PRN
  Administered 2020-06-15: 80 mg via INTRAVENOUS

## 2020-06-15 MED ORDER — MIDAZOLAM HCL 2 MG/2ML IJ SOLN
0.5000 mg | Freq: Once | INTRAMUSCULAR | Status: DC | PRN
Start: 2020-06-15 — End: 2020-06-16

## 2020-06-15 MED ORDER — PHENYLEPHRINE HCL-NACL 10-0.9 MG/250ML-% IV SOLN
INTRAVENOUS | Status: DC | PRN
  Administered 2020-06-15: 25 ug/min via INTRAVENOUS

## 2020-06-15 MED ORDER — FENTANYL CITRATE (PF) 100 MCG/2ML IJ SOLN
INTRAMUSCULAR | Status: DC | PRN
  Administered 2020-06-15: 50 ug via INTRAVENOUS
  Administered 2020-06-15 – 2020-06-16 (×5): 25 ug via INTRAVENOUS
  Administered 2020-06-16: 50 ug via INTRAVENOUS
  Administered 2020-06-16: 25 ug via INTRAVENOUS

## 2020-06-15 MED ORDER — LIDOCAINE 2% (20 MG/ML) 5 ML SYRINGE
INTRAMUSCULAR | Status: DC | PRN
  Administered 2020-06-15: 30 mg via INTRAVENOUS

## 2020-06-15 MED ORDER — ALBUMIN HUMAN 5 % IV SOLN: INTRAVENOUS | Status: DC | PRN

## 2020-06-15 MED ORDER — SODIUM CHLORIDE (PF) 0.9 % IJ SOLN
INTRAVENOUS | Status: DC | PRN
  Administered 2020-06-15: 16:00:00 100 mL via INTRAMUSCULAR
  Administered 2020-06-15: 19:00:00 15 mL via INTRAMUSCULAR

## 2020-06-15 MED ORDER — PROMETHAZINE HCL 25 MG/ML IJ SOLN
6.2500 mg | INTRAMUSCULAR | Status: DC | PRN
Start: 2020-06-15 — End: 2020-06-16

## 2020-06-15 MED ORDER — SODIUM CHLORIDE 0.9 % IV SOLN
INTRAVENOUS | Status: DC | PRN
  Administered 2020-06-15 (×2): 500 mL

## 2020-06-15 MED ORDER — MIDAZOLAM HCL 2 MG/2ML IJ SOLN
INTRAMUSCULAR | Status: AC
  Filled 2020-06-15: qty 2

## 2020-06-15 SURGICAL SUPPLY — 124 items
BAG DECANTER FOR FLEXI CONT (MISCELLANEOUS) ×1 IMPLANT
BAG ISOLATION DRAPE 18X18 (DRAPES) IMPLANT
BAG SNAP BAND KOVER 36X36 (MISCELLANEOUS) ×6 IMPLANT
BALLN MUSTANG 5X100X135 (BALLOONS) ×6
BALLN MUSTANG 7X100X135 (BALLOONS) ×6
BALLN MUSTANG 7X80X75 (BALLOONS) ×6
BALLOON MUSTANG 5X100X135 (BALLOONS) IMPLANT
BALLOON MUSTANG 7X100X135 (BALLOONS) IMPLANT
BALLOON MUSTANG 7X80X75 (BALLOONS) IMPLANT
BANDAGE ESMARK 6X9 LF (GAUZE/BANDAGES/DRESSINGS) IMPLANT
BLADE SURG 11 STRL SS (BLADE) ×6 IMPLANT
BNDG ESMARK 6X9 LF (GAUZE/BANDAGES/DRESSINGS)
CANISTER SUCT 1200ML W/VALVE (MISCELLANEOUS) ×6 IMPLANT
CANISTER SUCT 3000ML PPV (MISCELLANEOUS) ×6 IMPLANT
CANISTER WOUND CARE 500ML ATS (WOUND CARE) ×1 IMPLANT
CATH ANGIO 5F BER2 65CM (CATHETERS) IMPLANT
CATH BEACON 5 .035 40 KMP TP (CATHETERS) IMPLANT
CATH BEACON 5 .035 65 KMP TIP (CATHETERS) ×1 IMPLANT
CATH BEACON 5 .038 40 KMP TP (CATHETERS) ×4
CATH CROSS OVER TEMPO 5F (CATHETERS) ×1 IMPLANT
CATH OMNI FLUSH .035X70CM (CATHETERS) ×1 IMPLANT
CATH QUICKCROSS SUPP .035X90CM (MICROCATHETER) ×1 IMPLANT
CHLORAPREP W/TINT 26 (MISCELLANEOUS) ×6 IMPLANT
CLIP VESOCCLUDE MED 24/CT (CLIP) ×6 IMPLANT
CLIP VESOCCLUDE SM WIDE 24/CT (CLIP) ×6 IMPLANT
CONNECTOR Y ATS VAC SYSTEM (MISCELLANEOUS) ×1 IMPLANT
CORONARY SUCKER SOFT TIP 10052 (MISCELLANEOUS) IMPLANT
COVER DOME SNAP 22 D (MISCELLANEOUS) ×6 IMPLANT
COVER PROBE W GEL 5X96 (DRAPES) ×6 IMPLANT
COVER SURGICAL LIGHT HANDLE (MISCELLANEOUS) ×7 IMPLANT
COVER ULTRASOUND PROBE 36 ST (MISCELLANEOUS) ×1 IMPLANT
CUFF TOURN SGL QUICK 24 (TOURNIQUET CUFF)
CUFF TOURN SGL QUICK 34 (TOURNIQUET CUFF)
CUFF TOURN SGL QUICK 42 (TOURNIQUET CUFF) IMPLANT
CUFF TRNQT CYL 24X4X16.5-23 (TOURNIQUET CUFF) IMPLANT
CUFF TRNQT CYL 34X4.125X (TOURNIQUET CUFF) IMPLANT
DECANTER SPIKE VIAL GLASS SM (MISCELLANEOUS) IMPLANT
DERMABOND ADVANCED (GAUZE/BANDAGES/DRESSINGS) ×2
DERMABOND ADVANCED .7 DNX12 (GAUZE/BANDAGES/DRESSINGS) ×10 IMPLANT
DEVICE TORQUE KENDALL .025-038 (MISCELLANEOUS) ×2 IMPLANT
DRAIN HEMOVAC 1/8 X 5 (WOUND CARE) IMPLANT
DRAPE FEMORAL ANGIO 80X135IN (DRAPES) ×6 IMPLANT
DRAPE HALF SHEET 40X57 (DRAPES) ×1 IMPLANT
DRAPE ISOLATION BAG 18X18 (DRAPES) ×3
DRAPE X-RAY CASS 24X20 (DRAPES) IMPLANT
DRSG VAC ATS LRG SENSATRAC (GAUZE/BANDAGES/DRESSINGS) ×1 IMPLANT
ELECT REM PT RETURN 9FT ADLT (ELECTROSURGICAL) ×6
ELECTRODE REM PT RTRN 9FT ADLT (ELECTROSURGICAL) ×5 IMPLANT
EVACUATOR SILICONE 100CC (DRAIN) IMPLANT
GAUZE 4X4 16PLY RFD (DISPOSABLE) ×7 IMPLANT
GAUZE SPONGE 4X4 12PLY STRL (GAUZE/BANDAGES/DRESSINGS) ×6 IMPLANT
GLIDEWIRE ADV .035X180CM (WIRE) ×1 IMPLANT
GLOVE BIO SURGEON STRL SZ7.5 (GLOVE) ×7 IMPLANT
GLOVE BIO SURGEON STRL SZ8 (GLOVE) ×3 IMPLANT
GLOVE BIOGEL PI IND STRL 8 (GLOVE) IMPLANT
GLOVE BIOGEL PI INDICATOR 8 (GLOVE) ×1
GLOVE SS BIOGEL STRL SZ 6.5 (GLOVE) IMPLANT
GLOVE SUPERSENSE BIOGEL SZ 6.5 (GLOVE) ×4
GLOVE SURG UNDER POLY LF SZ7 (GLOVE) ×1 IMPLANT
GOWN STRL REUS W/ TWL LRG LVL3 (GOWN DISPOSABLE) ×15 IMPLANT
GOWN STRL REUS W/ TWL XL LVL3 (GOWN DISPOSABLE) ×5 IMPLANT
GOWN STRL REUS W/TWL LRG LVL3 (GOWN DISPOSABLE) ×25
GOWN STRL REUS W/TWL XL LVL3 (GOWN DISPOSABLE) ×8
GUIDEWIRE ANGLED .035X150CM (WIRE) IMPLANT
GUIDEWIRE ANGLED .035X260CM (WIRE) ×1 IMPLANT
HEMOSTAT SNOW SURGICEL 2X4 (HEMOSTASIS) ×2 IMPLANT
KIT BASIN OR (CUSTOM PROCEDURE TRAY) ×6 IMPLANT
KIT ENCORE 26 ADVANTAGE (KITS) ×1 IMPLANT
KIT TURNOVER KIT B (KITS) ×6 IMPLANT
LOOP VESSEL MAXI BLUE (MISCELLANEOUS) ×2 IMPLANT
NDL PERC 18GX7CM (NEEDLE) ×5 IMPLANT
NEEDLE PERC 18GX7CM (NEEDLE) ×6 IMPLANT
NS IRRIG 1000ML POUR BTL (IV SOLUTION) ×12 IMPLANT
PACK PERIPHERAL VASCULAR (CUSTOM PROCEDURE TRAY) ×6 IMPLANT
PACK SURGICAL SETUP 50X90 (CUSTOM PROCEDURE TRAY) ×6 IMPLANT
PAD ARMBOARD 7.5X6 YLW CONV (MISCELLANEOUS) ×12 IMPLANT
PAD NEG PRESSURE SENSATRAC (MISCELLANEOUS) ×1 IMPLANT
PROTECTION STATION PRESSURIZED (MISCELLANEOUS) ×6
SET COLLECT BLD 21X3/4 12 (NEEDLE) IMPLANT
SET MICROPUNCTURE 5F STIFF (MISCELLANEOUS) ×7 IMPLANT
SHEATH AVANTI 11CM 5FR (SHEATH) IMPLANT
SHEATH GUIDING 7F 55X73X9MM TD (SHEATH) ×1 IMPLANT
SHEATH PINNACLE 5F 10CM (SHEATH) ×2 IMPLANT
SHEATH PINNACLE ST 7F 45CM (SHEATH) ×1 IMPLANT
SNARE GOOSENECK 10MM (VASCULAR PRODUCTS) ×1 IMPLANT
SPONGE LAP 18X18 RF (DISPOSABLE) ×8 IMPLANT
SPONGE SURGIFOAM ABS GEL 100 (HEMOSTASIS) IMPLANT
STAPLER VISISTAT 35W (STAPLE) ×2 IMPLANT
STATION PROTECTION PRESSURIZED (MISCELLANEOUS) ×5 IMPLANT
STENT ELUVIA 6X100X130 (Permanent Stent) ×1 IMPLANT
STENT ELUVIA 6X120X130 (Permanent Stent) ×2 IMPLANT
STENT ELUVIA 6X60X130 (Permanent Stent) ×1 IMPLANT
STENT ELUVIA 7X100X130 (Permanent Stent) ×2 IMPLANT
STOPCOCK 4 WAY LG BORE MALE ST (IV SETS) IMPLANT
STOPCOCK MORSE 400PSI 3WAY (MISCELLANEOUS) ×6 IMPLANT
SUT PROLENE 5 0 C 1 24 (SUTURE) ×12 IMPLANT
SUT PROLENE 6 0 BV (SUTURE) ×9 IMPLANT
SUT PROLENE 6 0 CC (SUTURE) ×6 IMPLANT
SUT PROLENE 7 0 BV 1 (SUTURE) IMPLANT
SUT PROLENE 7 0 BV1 MDA (SUTURE) IMPLANT
SUT SILK 2 0 SH (SUTURE) ×5 IMPLANT
SUT SILK 3 0 (SUTURE)
SUT SILK 3-0 18XBRD TIE 12 (SUTURE) IMPLANT
SUT VIC AB 2-0 CT1 27 (SUTURE) ×16
SUT VIC AB 2-0 CT1 TAPERPNT 27 (SUTURE) IMPLANT
SUT VIC AB 2-0 CTX 36 (SUTURE) ×10 IMPLANT
SUT VIC AB 3-0 SH 27 (SUTURE) ×12
SUT VIC AB 3-0 SH 27X BRD (SUTURE) ×10 IMPLANT
SUT VIC AB 4-0 PS2 27 (SUTURE) ×2 IMPLANT
SYR 10ML LL (SYRINGE) ×20 IMPLANT
SYR 20ML LL LF (SYRINGE) ×6 IMPLANT
SYR 30ML LL (SYRINGE) ×6 IMPLANT
SYR MEDRAD MARK V 150ML (SYRINGE) IMPLANT
TAPE STRIPS DRAPE STRL (GAUZE/BANDAGES/DRESSINGS) ×1 IMPLANT
TAPE UMBILICAL COTTON 1/8X30 (MISCELLANEOUS) IMPLANT
TOWEL GREEN STERILE (TOWEL DISPOSABLE) ×12 IMPLANT
TRAY FOLEY SLVR 16FR LF STAT (SET/KITS/TRAYS/PACK) ×1 IMPLANT
TUBING EXTENTION W/L.L. (IV SETS) IMPLANT
TUBING HIGH PRESSURE 120CM (CONNECTOR) IMPLANT
UNDERPAD 30X36 HEAVY ABSORB (UNDERPADS AND DIAPERS) ×6 IMPLANT
WATER STERILE IRR 1000ML POUR (IV SOLUTION) ×6 IMPLANT
WIRE BENTSON .035X145CM (WIRE) ×7 IMPLANT
WIRE ROSEN-J .035X260CM (WIRE) ×1 IMPLANT
WIRE TORQFLEX AUST .018X40CM (WIRE) ×1 IMPLANT

## 2020-06-15 SURGICAL SUPPLY — 90 items
BALLN MUSTANG 7X60X75 (BALLOONS) ×6
BALLOON MUSTANG 7X60X75 (BALLOONS) ×4 IMPLANT
BANDAGE ESMARK 6X9 LF (GAUZE/BANDAGES/DRESSINGS) IMPLANT
BNDG ELASTIC 4X5.8 VLCR STR LF (GAUZE/BANDAGES/DRESSINGS) IMPLANT
BNDG ESMARK 6X9 LF (GAUZE/BANDAGES/DRESSINGS)
CANISTER SUCT 3000ML PPV (MISCELLANEOUS) ×6 IMPLANT
CANNULA VESSEL 3MM 2 BLNT TIP (CANNULA) IMPLANT
CATH BEACON 5.038 65CM KMP-01 (CATHETERS) ×6 IMPLANT
CATH CROSS OVER TEMPO 5F (CATHETERS) ×6 IMPLANT
CATH EMB 3FR 40CM (CATHETERS) ×6 IMPLANT
CATH EMB 3FR 80CM (CATHETERS) IMPLANT
CATH EMB 4FR 80CM (CATHETERS) ×6 IMPLANT
CATH EMB 5FR 80CM (CATHETERS) IMPLANT
CATH OMNI FLUSH 5F 65CM (CATHETERS) ×6 IMPLANT
CLIP VESOCCLUDE MED 24/CT (CLIP) ×6 IMPLANT
CLIP VESOCCLUDE SM WIDE 24/CT (CLIP) ×6 IMPLANT
CONNECTOR Y ATS VAC SYSTEM (MISCELLANEOUS) ×6 IMPLANT
COVER DOME SNAP 22 D (MISCELLANEOUS) ×6 IMPLANT
COVER WAND RF STERILE (DRAPES) ×6 IMPLANT
CRYO VEIN SAPHENOUS (Tissue) ×6 IMPLANT
CUFF TOURN SGL QUICK 24 (TOURNIQUET CUFF)
CUFF TOURN SGL QUICK 34 (TOURNIQUET CUFF)
CUFF TOURN SGL QUICK 42 (TOURNIQUET CUFF) IMPLANT
CUFF TRNQT CYL 24X4X16.5-23 (TOURNIQUET CUFF) IMPLANT
CUFF TRNQT CYL 34X4.125X (TOURNIQUET CUFF) IMPLANT
DERMABOND ADVANCED (GAUZE/BANDAGES/DRESSINGS) ×2
DERMABOND ADVANCED .7 DNX12 (GAUZE/BANDAGES/DRESSINGS) ×4 IMPLANT
DEVICE TORQUE KENDALL .025-038 (MISCELLANEOUS) ×6 IMPLANT
DRAIN CHANNEL 15F RND FF W/TCR (WOUND CARE) IMPLANT
DRAPE HALF SHEET 40X57 (DRAPES) ×6 IMPLANT
DRAPE X-RAY CASS 24X20 (DRAPES) IMPLANT
DRSG COVADERM 4X8 (GAUZE/BANDAGES/DRESSINGS) ×12 IMPLANT
DRSG VAC ATS SM SENSATRAC (GAUZE/BANDAGES/DRESSINGS) ×12 IMPLANT
ELECT REM PT RETURN 9FT ADLT (ELECTROSURGICAL) ×6
ELECTRODE REM PT RTRN 9FT ADLT (ELECTROSURGICAL) ×4 IMPLANT
EVACUATOR SILICONE 100CC (DRAIN) IMPLANT
GLIDEWIRE ADV .035X180CM (WIRE) ×6 IMPLANT
GLOVE BIO SURGEON STRL SZ7.5 (GLOVE) ×6 IMPLANT
GOWN STRL REUS W/ TWL LRG LVL3 (GOWN DISPOSABLE) ×8 IMPLANT
GOWN STRL REUS W/ TWL XL LVL3 (GOWN DISPOSABLE) ×4 IMPLANT
GOWN STRL REUS W/TWL LRG LVL3 (GOWN DISPOSABLE) ×6
GOWN STRL REUS W/TWL XL LVL3 (GOWN DISPOSABLE) ×3
GUIDEWIRE ANGLED .035X150CM (WIRE) ×6 IMPLANT
GUIDEWIRE ANGLED .035X260CM (WIRE) ×6 IMPLANT
INSERT FOGARTY SM (MISCELLANEOUS) IMPLANT
KIT BASIN OR (CUSTOM PROCEDURE TRAY) ×6 IMPLANT
KIT ENCORE 26 ADVANTAGE (KITS) ×12 IMPLANT
KIT TURNOVER KIT B (KITS) ×6 IMPLANT
MARKER GRAFT CORONARY BYPASS (MISCELLANEOUS) IMPLANT
NS IRRIG 1000ML POUR BTL (IV SOLUTION) ×12 IMPLANT
PACK PERIPHERAL VASCULAR (CUSTOM PROCEDURE TRAY) ×6 IMPLANT
PAD ARMBOARD 7.5X6 YLW CONV (MISCELLANEOUS) ×12 IMPLANT
SET COLLECT BLD 21X3/4 12 (NEEDLE) IMPLANT
SET MICROPUNCTURE 5F STIFF (MISCELLANEOUS) ×6 IMPLANT
SHEATH BRITE TIP 7FRX11 (SHEATH) ×12 IMPLANT
SHEATH PINNACLE 5F 10CM (SHEATH) ×6 IMPLANT
SHEATH PINNACLE 6F 10CM (SHEATH) IMPLANT
SNARE GOOSENECK 10MM (VASCULAR PRODUCTS) ×6 IMPLANT
STENT ELUVIA 7X120X130 (Permanent Stent) ×6 IMPLANT
STENT VIABAHNBX 8X29X135 (Permanent Stent) ×12 IMPLANT
STOPCOCK 4 WAY LG BORE MALE ST (IV SETS) ×6 IMPLANT
SUT ETHILON 3 0 PS 1 (SUTURE) IMPLANT
SUT GORETEX 6.0 TT13 (SUTURE) IMPLANT
SUT GORETEX 6.0 TT9 (SUTURE) IMPLANT
SUT MNCRL AB 4-0 PS2 18 (SUTURE) ×12 IMPLANT
SUT PROLENE 5 0 C 1 24 (SUTURE) ×18 IMPLANT
SUT PROLENE 6 0 BV (SUTURE) ×30 IMPLANT
SUT PROLENE 7 0 BV 1 (SUTURE) IMPLANT
SUT SILK 2 0 PERMA HAND 18 BK (SUTURE) ×6 IMPLANT
SUT SILK 2 0 SH (SUTURE) ×6 IMPLANT
SUT SILK 3 0 (SUTURE)
SUT SILK 3-0 18XBRD TIE 12 (SUTURE) IMPLANT
SUT VIC AB 2-0 CT1 27 (SUTURE) ×20
SUT VIC AB 2-0 CT1 TAPERPNT 27 (SUTURE) ×20 IMPLANT
SUT VIC AB 3-0 SH 27 (SUTURE) ×4
SUT VIC AB 3-0 SH 27X BRD (SUTURE) ×8 IMPLANT
SYR 10ML LL (SYRINGE) ×24 IMPLANT
SYR 20ML LL LF (SYRINGE) ×6 IMPLANT
SYR 3ML LL SCALE MARK (SYRINGE) ×12 IMPLANT
SYR BULB IRRIG 60ML STRL (SYRINGE) ×6 IMPLANT
TAPE UMBILICAL COTTON 1/8X30 (MISCELLANEOUS) IMPLANT
TOWEL GREEN STERILE (TOWEL DISPOSABLE) ×6 IMPLANT
TRAY FOLEY MTR SLVR 16FR STAT (SET/KITS/TRAYS/PACK) IMPLANT
TUBING EXTENTION W/L.L. (IV SETS) IMPLANT
UNDERPAD 30X36 HEAVY ABSORB (UNDERPADS AND DIAPERS) ×6 IMPLANT
VEIN 'CRYO SAPHENOUS (Tissue) ×4 IMPLANT
WATER STERILE IRR 1000ML POUR (IV SOLUTION) ×6 IMPLANT
WIRE BENTSON .035X145CM (WIRE) IMPLANT
WIRE ROSEN-J .035X260CM (WIRE) ×6 IMPLANT
WIRE TORQFLEX AUST .018X40CM (WIRE) ×24 IMPLANT

## 2020-06-15 NOTE — H&P (View-Only) (Signed)
  Progress Note    06/15/2020 7:14 AM 1 Day Post-Op  Subjective:  Resting comfortably-no complaints.  Says her feet feel fine.  Tm 99.2 now afebrile  Vitals:   06/15/20 0325 06/15/20 0400  BP: (!) 138/49 (!) 138/45  Pulse: 60 (!) 56  Resp: 19 18  Temp: 98.1 F (36.7 C) 98.5 F (36.9 C)  SpO2: 99% 97%    Physical Exam: general:  Resting comfortably Lungs:  Non labored Incisions:  Right groin dressing in tact-I did not take this down.  Left groin looks ok with mild erythema that looks to be from suture.    CBC    Component Value Date/Time   WBC 10.4 06/15/2020 0054   RBC 2.26 (L) 06/15/2020 0054   HGB 6.4 (LL) 06/15/2020 0054   HCT 21.7 (L) 06/15/2020 0054   PLT 246 06/15/2020 0054   MCV 96.0 06/15/2020 0054   MCH 28.3 06/15/2020 0054   MCHC 29.5 (L) 06/15/2020 0054   RDW 15.5 06/15/2020 0054   LYMPHSABS 1.3 04/25/2020 0345   MONOABS 1.1 (H) 04/25/2020 0345   EOSABS 0.1 04/25/2020 0345   BASOSABS 0.0 04/25/2020 0345    BMET    Component Value Date/Time   NA 136 06/15/2020 0054   K 4.7 06/15/2020 0054   CL 103 06/15/2020 0054   CO2 22 06/15/2020 0054   GLUCOSE 169 (H) 06/15/2020 0054   BUN 14 06/15/2020 0054   CREATININE 0.81 06/15/2020 0054   CALCIUM 7.7 (L) 06/15/2020 0054   GFRNONAA >60 06/15/2020 0054   GFRAA 52 (L) 02/18/2018 1610    INR    Component Value Date/Time   INR 1.0 05/03/2020 1530     Intake/Output Summary (Last 24 hours) at 06/15/2020 0714 Last data filed at 06/15/2020 0607 Gross per 24 hour  Intake 1410.96 ml  Output 100 ml  Net 1310.96 ml     Assessment:  69 y.o. female is s/p:  1.  Sharp excisional debridement right groin wound to 11 x 4 x 4 cm 2.  Pulsavac irrigation right groin wound 3.  Interposition graft of femorofemoral bypass with 6 mm PTFE 1 Day Post-Op  Plan: -pt resting comfortably this morning.  -acute blood loss anemia-receiving PRBC's this morning -dressing left in tact this am.  It is clean and dry.   Right groin with mild erythema around sutures.  -discussed with pt about findings in OR yesterday and need to return to OR to remove prosthetic material and try stenting in setting of infection.  Npo order was removed and I re-ordered this morning and discussed with RN and pt not to eat or drink.   -continue broad spectrum abx coverage - gram stain from wound culture in OR not resulted yet.  -for OR later today. -DVT prophylaxis:  SCD's as pt is at high risk for bleeding at this time.   Doreatha Massed, PA-C [facility] [phone] 06/15/2020 7:14 AM  VASCULAR STAFF ADDENDUM: I have independently interviewed and examined the patient. I agree with the above.  Plan for graft excision, vein patch repair of arteriotomies, endovascular recanalization of RLE today in OR. Explained plan in detail to daughter and patient. They are understanding and wish to proceed.  Rande Brunt. Lenell Antu, MD [facility] Phone Number: [phone] 06/15/2020 9:54 AM

## 2020-06-15 NOTE — Anesthesia Preprocedure Evaluation (Addendum)
Anesthesia Evaluation  Patient identified by MRN, date of birth, ID band Patient awake    Reviewed: Allergy & Precautions, NPO status , Patient's Chart, lab work & pertinent test results  History of Anesthesia Complications Negative for: history of anesthetic complications  Airway Mallampati: I  TM Distance: >3 FB Neck ROM: Full    Dental  (+) Edentulous Upper, Edentulous Lower   Pulmonary COPD, Current Smoker and Patient abstained from smoking.,  06/14/2020 SARS coronavirus NEG   breath sounds clear to auscultation       Cardiovascular hypertension, Pt. on medications (-) angina+ Peripheral Vascular Disease   Rhythm:Regular Rate:Normal     Neuro/Psych    GI/Hepatic negative GI ROS, Neg liver ROS,   Endo/Other  diabetes (glu 117), Oral Hypoglycemic Agents  Renal/GU Renal InsufficiencyRenal disease     Musculoskeletal   Abdominal   Peds  Hematology  (+) Blood dyscrasia (Hb 6.4: now s/p 1u pRBC), anemia , eliquis   Anesthesia Other Findings   Reproductive/Obstetrics                            Anesthesia Physical Anesthesia Plan  ASA: III  Anesthesia Plan: General   Post-op Pain Management:    Induction: Intravenous  PONV Risk Score and Plan: 2 and Ondansetron and Dexamethasone  Airway Management Planned: Oral ETT  Additional Equipment: Arterial line  Intra-op Plan:   Post-operative Plan: Extubation in OR  Informed Consent: I have reviewed the patients History and Physical, chart, labs and discussed the procedure including the risks, benefits and alternatives for the proposed anesthesia with the patient or authorized representative who has indicated his/her understanding and acceptance.       Plan Discussed with: CRNA and Surgeon  Anesthesia Plan Comments:        Anesthesia Quick Evaluation

## 2020-06-15 NOTE — Progress Notes (Addendum)
Inpatient Diabetes Program Recommendations  AACE/ADA: New Consensus Statement on Inpatient Glycemic Control (2015)  Target Ranges:  Prepandial:   less than 140 mg/dL      Peak postprandial:   less than 180 mg/dL (1-2 hours)      Critically ill patients:  140 - 180 mg/dL   Lab Results  Component Value Date   GLUCAP 126 (H) 06/14/2020   HGBA1C 7.2 (H) 05/11/2020    Review of Glycemic Control  Diabetes history:  DM2 Outpatient Diabetes medications:  Metformin 1000 bid Jardiance 25 mg daily Current orders for Inpatient glycemic control:  Metformin 1000 mg bid Jardiance 25 mg daily  Inpatient Diabetes Program Recommendations:     Patient is NPO today for procedure at 4 pm.  Metformin 1000 mg given this morning. Please consider holding Jardiance today.  Received referral for post op DM management.  Please consider holding home medications and adding Novolog 0-9 TID with meals post op if CBG's are > 180 mg/dL.  Will continue to follow while inpatient.  Thank you, Dulce Sellar, RN, BSN Diabetes Coordinator Inpatient Diabetes Program [phone] (team pager from 8a-5p)

## 2020-06-15 NOTE — Anesthesia Procedure Notes (Signed)
Arterial Line Insertion Start/End12/30/2021 1:22 PM, 06/15/2020 1:24 PM Performed by: Epifanio Lesches, CRNA, CRNA  Preanesthetic checklist: patient identified, IV checked, site marked, risks and benefits discussed, surgical consent, monitors and equipment checked, pre-op evaluation, timeout performed and anesthesia consent Patient sedated Right, radial was placed Catheter size: 20 G Hand hygiene performed  and maximum sterile barriers used  Allen's test indicative of satisfactory collateral circulation Attempts: 1 Procedure performed without using ultrasound guided technique. Following insertion, dressing applied and Biopatch. Post procedure assessment: normal  Patient tolerated the procedure well with no immediate complications.

## 2020-06-15 NOTE — Anesthesia Procedure Notes (Signed)
Procedure Name: Intubation Date/Time: 06/15/2020 1:10 PM Performed by: Barrington Ellison, CRNA Pre-anesthesia Checklist: Patient identified, Emergency Drugs available, Suction available and Patient being monitored Patient Re-evaluated:Patient Re-evaluated prior to induction Oxygen Delivery Method: Circle System Utilized Preoxygenation: Pre-oxygenation with 100% oxygen Induction Type: IV induction Ventilation: Mask ventilation without difficulty Laryngoscope Size: Mac and 3 Grade View: Grade I Tube type: Oral Tube size: 7.0 mm Number of attempts: 1 Airway Equipment and Method: Stylet and Oral airway Placement Confirmation: ETT inserted through vocal cords under direct vision,  positive ETCO2 and breath sounds checked- equal and bilateral Secured at: 21 cm Tube secured with: Tape Dental Injury: Teeth and Oropharynx as per pre-operative assessment

## 2020-06-15 NOTE — Interval H&P Note (Signed)
History and Physical Interval Note:  06/15/2020 12:28 PM  Holly Stevenson  has presented today for surgery, with the diagnosis of WOUND INFECTION.  The various methods of treatment have been discussed with the patient and family. After consideration of risks, benefits and other options for treatment, the patient has consented to  Procedure(s): EXCISION OF FEMORAL-FEMORAL BYPASS,  EXCISION OF FEMORAL-POPLITEAL BYPASS (N/A) ANGIOGRAM WITH STENTING LEFT LOWER EXTREMITY (Left) as a surgical intervention.  The patient's history has been reviewed, patient examined, no change in status, stable for surgery.  I have reviewed the patient's chart and labs.  Questions were answered to the patient's satisfaction.     Lemar Livings

## 2020-06-15 NOTE — Transfer of Care (Signed)
Immediate Anesthesia Transfer of Care Note  Patient: Holly Stevenson  Procedure(s) Performed: EXCISION OF FEMORAL-FEMORAL BYPASS,  EXCISION OF RIGHT FEMORAL-POPLITEAL BYPASS (N/A Leg Upper) ANGIOGRAM SUPERFICIAL FEMORAL ARTERY x 4 WITH LEFT COMMON ILLIAC STENT AND STENTING LEFT LOWER EXTREMITY (Left Groin) APPLICATION OF WOUND VAC BILATERAL GROINS (Bilateral Groin) RIGHT GREATER SAPHENOUS VEIN HARVEST (Right Leg Upper) BILATERAL FEMORAL ENDARTERECTOMY WITH PATCH ANGIOPLASTY RIGHT FERMORAL (Bilateral Groin) AORTOGRAM (N/A Groin)  Patient Location: PACU  Anesthesia Type:General  Level of Consciousness: awake, alert  and oriented  Airway & Oxygen Therapy: Patient Spontanous Breathing and Patient connected to face mask oxygen  Post-op Assessment: Report given to RN, Post -op Vital signs reviewed and stable and Patient moving all extremities  Post vital signs: Reviewed and stable  Last Vitals:  Vitals Value Taken Time  BP    Temp    Pulse 56 06/15/20 2031  Resp 15 06/15/20 2032  SpO2 100 % 06/15/20 2032  Vitals shown include unvalidated device data.  Last Pain:  Vitals:   06/15/20 1142  TempSrc: Oral  PainSc:       Patients Stated Pain Goal: 3 (06/14/20 8182)  Complications: No complications documented.

## 2020-06-15 NOTE — Progress Notes (Addendum)
CRITICAL VALUE ALERT  Critical Value:  Hgb 6.4  Date & Time Notied:  06/15/2020 0200  Provider Notified: Lenell Antu  Orders Received/Actions taken: New orders placed

## 2020-06-15 NOTE — Progress Notes (Addendum)
  Progress Note    06/15/2020 7:14 AM 1 Day Post-Op  Subjective:  Resting comfortably-no complaints.  Says her feet feel fine.  Tm 99.2 now afebrile  Vitals:   06/15/20 0325 06/15/20 0400  BP: (!) 138/49 (!) 138/45  Pulse: 60 (!) 56  Resp: 19 18  Temp: 98.1 F (36.7 C) 98.5 F (36.9 C)  SpO2: 99% 97%    Physical Exam: general:  Resting comfortably Lungs:  Non labored Incisions:  Right groin dressing in tact-I did not take this down.  Left groin looks ok with mild erythema that looks to be from suture.    CBC    Component Value Date/Time   WBC 10.4 06/15/2020 0054   RBC 2.26 (L) 06/15/2020 0054   HGB 6.4 (LL) 06/15/2020 0054   HCT 21.7 (L) 06/15/2020 0054   PLT 246 06/15/2020 0054   MCV 96.0 06/15/2020 0054   MCH 28.3 06/15/2020 0054   MCHC 29.5 (L) 06/15/2020 0054   RDW 15.5 06/15/2020 0054   LYMPHSABS 1.3 04/25/2020 0345   MONOABS 1.1 (H) 04/25/2020 0345   EOSABS 0.1 04/25/2020 0345   BASOSABS 0.0 04/25/2020 0345    BMET    Component Value Date/Time   NA 136 06/15/2020 0054   K 4.7 06/15/2020 0054   CL 103 06/15/2020 0054   CO2 22 06/15/2020 0054   GLUCOSE 169 (H) 06/15/2020 0054   BUN 14 06/15/2020 0054   CREATININE 0.81 06/15/2020 0054   CALCIUM 7.7 (L) 06/15/2020 0054   GFRNONAA >60 06/15/2020 0054   GFRAA 52 (L) 02/18/2018 1610    INR    Component Value Date/Time   INR 1.0 05/03/2020 1530     Intake/Output Summary (Last 24 hours) at 06/15/2020 0714 Last data filed at 06/15/2020 0607 Gross per 24 hour  Intake 1410.96 ml  Output 100 ml  Net 1310.96 ml     Assessment:  69 y.o. female is s/p:  1.  Sharp excisional debridement right groin wound to 11 x 4 x 4 cm 2.  Pulsavac irrigation right groin wound 3.  Interposition graft of femorofemoral bypass with 6 mm PTFE 1 Day Post-Op  Plan: -pt resting comfortably this morning.  -acute blood loss anemia-receiving PRBC's this morning -dressing left in tact this am.  It is clean and dry.   Right groin with mild erythema around sutures.  -discussed with pt about findings in OR yesterday and need to return to OR to remove prosthetic material and try stenting in setting of infection.  Npo order was removed and I re-ordered this morning and discussed with RN and pt not to eat or drink.   -continue broad spectrum abx coverage - gram stain from wound culture in OR not resulted yet.  -for OR later today. -DVT prophylaxis:  SCD's as pt is at high risk for bleeding at this time.   Doreatha Massed, PA-C [facility] [phone] 06/15/2020 7:14 AM  VASCULAR STAFF ADDENDUM: I have independently interviewed and examined the patient. I agree with the above.  Plan for graft excision, vein patch repair of arteriotomies, endovascular recanalization of LLE today in OR. Explained plan in detail to daughter and patient. They are understanding and wish to proceed.  Rande Brunt. Lenell Antu, MD [facility] Phone Number: [phone] 06/15/2020 9:54 AM

## 2020-06-15 NOTE — Anesthesia Procedure Notes (Signed)
Procedure Name: Intubation Date/Time: 06/15/2020 10:08 PM Performed by: Railynn Ballo T, CRNA Pre-anesthesia Checklist: Patient identified, Emergency Drugs available, Suction available and Patient being monitored Patient Re-evaluated:Patient Re-evaluated prior to induction Oxygen Delivery Method: Circle system utilized Preoxygenation: Pre-oxygenation with 100% oxygen Induction Type: IV induction Ventilation: Mask ventilation without difficulty Laryngoscope Size: Miller and 2 Grade View: Grade I Tube type: Oral Tube size: 7.5 mm Number of attempts: 1 Airway Equipment and Method: Stylet and Oral airway Placement Confirmation: ETT inserted through vocal cords under direct vision,  positive ETCO2 and breath sounds checked- equal and bilateral Secured at: 21 cm Tube secured with: Tape Dental Injury: Teeth and Oropharynx as per pre-operative assessment

## 2020-06-15 NOTE — Anesthesia Postprocedure Evaluation (Signed)
Anesthesia Post Note  Patient: Holly Stevenson  Procedure(s) Performed: EXCISION OF FEMORAL-FEMORAL BYPASS,  EXCISION OF RIGHT FEMORAL-POPLITEAL BYPASS (N/A Leg Upper) ANGIOGRAM SUPERFICIAL FEMORAL ARTERY x 4 WITH LEFT COMMON ILLIAC STENT AND STENTING LEFT LOWER EXTREMITY (Left Groin) APPLICATION OF WOUND VAC BILATERAL GROINS (Bilateral Groin) RIGHT GREATER SAPHENOUS VEIN HARVEST (Right Leg Upper) BILATERAL FEMORAL ENDARTERECTOMY WITH PATCH ANGIOPLASTY RIGHT FERMORAL (Bilateral Groin) AORTOGRAM (N/A Groin)     Patient location during evaluation: PACU Anesthesia Type: General Level of consciousness: awake and alert Pain management: pain level controlled Vital Signs Assessment: post-procedure vital signs reviewed and stable Respiratory status: spontaneous breathing, nonlabored ventilation, respiratory function stable and patient connected to nasal cannula oxygen Cardiovascular status: blood pressure returned to baseline and stable Postop Assessment: no apparent nausea or vomiting Anesthetic complications: no   No complications documented.  Last Vitals:  Vitals:   06/15/20 2045 06/15/20 2100  BP: (!) 129/39 (!) 138/46  Pulse: 63 67  Resp: (!) 24 12  Temp: 36.6 C   SpO2: 100% 100%    Last Pain:  Vitals:   06/15/20 2045  TempSrc: Temporal  PainSc:                  Holly Stevenson

## 2020-06-15 NOTE — Anesthesia Preprocedure Evaluation (Signed)
Anesthesia Evaluation  Patient identified by MRN, date of birth, ID band Patient awake    Reviewed: Allergy & Precautions, NPO status , Patient's Chart, lab work & pertinent test results  History of Anesthesia Complications Negative for: history of anesthetic complications  Airway Mallampati: I  TM Distance: >3 FB Neck ROM: Full    Dental  (+) Edentulous Upper, Edentulous Lower   Pulmonary COPD, Current Smoker and Patient abstained from smoking.,  06/14/2020 SARS coronavirus NEG   breath sounds clear to auscultation       Cardiovascular hypertension, Pt. on medications (-) angina+ Peripheral Vascular Disease   Rhythm:Regular Rate:Normal     Neuro/Psych  Neuromuscular disease negative psych ROS   GI/Hepatic negative GI ROS, Neg liver ROS,   Endo/Other  diabetes, Type 2, Oral Hypoglycemic Agents  Renal/GU Renal InsufficiencyRenal disease     Musculoskeletal  (+) Arthritis ,   Abdominal   Peds  Hematology  (+) Blood dyscrasia (Hb 6.4: now s/p 1u pRBC), anemia , eliquis   Anesthesia Other Findings   Reproductive/Obstetrics                             Anesthesia Physical  Anesthesia Plan  ASA: III and emergent  Anesthesia Plan: General   Post-op Pain Management:    Induction: Intravenous  PONV Risk Score and Plan: 2 and Ondansetron and Dexamethasone  Airway Management Planned: Oral ETT  Additional Equipment: Arterial line  Intra-op Plan:   Post-operative Plan: Possible Post-op intubation/ventilation  Informed Consent: I have reviewed the patients History and Physical, chart, labs and discussed the procedure including the risks, benefits and alternatives for the proposed anesthesia with the patient or authorized representative who has indicated his/her understanding and acceptance.       Plan Discussed with: CRNA, Surgeon and Anesthesiologist  Anesthesia Plan Comments:  (Emergency bring back for no pulse in left leg. Discussed with patient in PACU.)        Anesthesia Quick Evaluation

## 2020-06-15 NOTE — Op Note (Signed)
Patient name: Holly Stevenson MRN: [id number] DOB: 1950-11-01 Sex: female  06/15/2020 Pre-operative Diagnosis: infected femoral-femoral graft, right common femoral bovine patch, and left femoral to popliteal bypass graft Post-operative diagnosis:  Same Surgeon:  Luanna Salk. Randie Heinz, MD Assistants: Elna Breslow, MD; Wendi Maya, PA Procedure Performed: 1.  Excision of right to left femorofemoral bypass, right common femoral bovine pericardial patch and left femoral to popliteal artery bypass graft with reopening of left groin and left above-knee popliteal exposure incisions less than 30 days 2.  Harvest of right greater saphenous vein 3.  Patch angioplasty of right common femoral artery with greater saphenous vein, left common femoral endarterectomy and saphenous vein patch angioplasty, left popliteal artery vein angioplasty 4.  Aortogram 5.  Stent of left common and external iliac veins with 7 x 120 and 7 x 100 mm Elluvia 6.  Stent of left SFA with 6 x 120, 6 x 100, 6 x 120 and 6 x 60 mm Elluvia 7.  Sartorius muscle flap right groin wound   Indications: 69 year old female with grossly infected right groin with what appears to be infected femoral to femoral bypass graft as well as involved left common femoral to above-knee popliteal artery bypass graft.  All of this was performed for a wound on her left foot.  She is now indicated for removal of the graft with replacement of the vein and possible revascularization endovascularly.  Assistants were necessary to facilitate exposure and repair of the above-noted vessels as well as expedite the case.  Findings: Right groin had significant fibrinous debris that was debrided to healthy-appearing tissue.  The graft was grossly infected.  After obtaining exposure and proximal distal control in the right groin, left groin, above-knee left popliteal incision we then heparinized the patient excised all of the grafts.  We are able to harvest a very  diminutive right greater saphenous vein from below the knee this was the best vein I could identify with ultrasound.  We placed patch angioplasties on the bilateral common femoral arteries.  The left common femoral artery was significantly calcified I performed endarterectomy prior to patch angioplasty.  I also endarterectomized the above-knee popliteal artery on the left performed patch angioplasty.  We then performed stenting from a right groin we were able to snare the wire retrograde from the left groin that was in a dissected plane we were then able to work antegrade from the right groin in-stent the common external iliac arteries and balloon dilate these with 7 mm balloon.  We then work antegrade to stent the left SFA with drug-eluting stents.  After this the patient did have monophasic anterior tibial and posterior tibial signals.  There was a strong palpable common femoral pulse in the right.  There were after removing the sheath and placing a repair stitch unfortunately we had a waterhammer pulse which we then had to clamp and reopen the right common femoral artery perform repeat patch angioplasty.  At completion there were very strong signals in the right profunda femoris artery.  There was a high resistance signal in the right SFA.  Sartorius muscle flap was placed to cover the right common femoral artery and wound vacs were fashioned in both groins.   Procedure:  The patient was identified in the holding area and taken to the operating room where she is placed supine on operative table and general anesthesia was induced.  Antibiotics were updated.  She was gently prepped and draped in the abdomen bilateral lower extremities in  usual fashion and a timeout was called.  I began by removing the packing in the right groin.  I then dissected proximally up onto the inguinal ligament to the external iliac artery.  I was able to get a clamp there safely.  I then dissected down to the profunda and SFA and was  able to place Vesseloops around these.  I then reopened the left groin which had previously been closed remain closed.  There was turbid appearing fluid there.  I dissected up onto the inguinal ligament to the external iliac artery and placed a vessel loop around this.  I also dissected out the profunda and SFA and placed Vesseloops around these.  We then opened the above-knee popliteal incision dissected down to the graft itself and placed Vesseloops above and below the graft on the popliteal artery.  I used ultrasound to identify the best vein I could find in the lower extremities which is the greater saphenous vein on the right below the knee.  I used 3 separate incisions to harvest this vein dividing branches between ties.  I clipped it on both ends and excised it.  At this time the patient was fully heparinized.  All inflow and outflow vessels were clamped and all grafts were excised.  I then used the best part of vein that I could find to perform patch angioplasty of the right common femoral artery.  Prior to completion of this I then released the clamps had good backbleeding and antegrade bleeding and allow perfusion of the right limb.  In the left groin I had to perform extensive endarterectomy extending up into the external iliac artery and the profunda femoris artery were established good backbleeding.  I then performed vein patch angioplasty there.  Prior to completion of flushing all directions.  Upon completion there was a good signal in the profunda a high resistance signal in the SFA.  I would also perform endarterectomy down to the SFA to facilitate endovascular repair.  Above the knee I did have to perform limited endarterectomy to facilitate patch angioplasty.  I did this then irrigated with heparinized saline.  I then turned my attention to the endovascular portion of the case.  I first cannulated the left common femoral artery patch with a 18-gauge needle and a wire.  Unfortunately the wire that  I use would only track in a dissection plane.  I then used the wire from the right common femoral artery.  I used an 18-gauge needle and a Bentson wire and placed a 5 [place] sheath.  I used both Omni catheter and crossover catheters and could not cross the occluded left common iliac artery.  I did perform aortogram at this time the right common external iliac arteries were patent.  With this I then cannulated the popliteal artery patch.  This was done with 18-gauge needle followed by Bentson wire.  Ultimately I did exchanged for a micropuncture needle followed by wire and sheath and then placed a Glidewire advantage.  I was able to traverse the entire occluded SFA up to the common femoral artery performed angiography which demonstrated that I was within the lumen.  We then traversed the external and common iliac arteries back to the aorta.  This was in the dissection plane but I did have some communication to the right common iliac artery.  I then was able to snare the wire from the right side this was a long Glidewire.  I then placed a long 7 French sheath in from  the right side.  We began by stenting from the external iliac artery distally with 7 x 120 mm drug-eluting stent and extended with a 7 x 100 mm drug-eluting stent up to the aortic bifurcation.  These were ballooned out with 7 mm balloon.  Completion demonstrated very good flow to the left common femoral artery.  We then used a quick cross catheter up and over our floss wire down to the popliteal artery.  We then remove the wire turned around were able to get it into the healthy popliteal artery at the knee.  We confirmed this angiographically.  We then primarily stented the SFA with 4 consecutive drug-eluting stents and these were postdilated with 5 mm balloon.  Completion demonstrated flow in the anterior tibial and posterior tibial arteries.  With this we removed the sheath in the right groin and suture-ligated this with 5-0 Prolene suture.   Unfortunately given the diminutive aspect of the vein we then had a waterhammer signal in the right groin.  We then reclamped our inflow and outflow open the vein patch longitudinally.  Unfortunately this did appear to be completely close down.  There was no thrombus there.  We put a new vein patch on this in place with 5-0 Prolene suture and irrigated with heparinized saline.  Completion we did have to replace a few repair stitches with pledgets.  We then had very good signal distally in the profunda and the SFA.  Satisfied with this we irrigated all of her wounds.  The vein harvest sites were closed with Vicryl suture and staples.  The above-knee popliteal incision on the left was closed with deep Vicryl and staples.  The left groin we washed out we were able to get some soft tissue coverage over our femoral endarterectomy site and then placed a wound VAC in the left groin.  The right side I then mobilized the sartorius muscle up to the anterior superior iliac spine and divided there.  I bluntly mobilized inferiorly.  There was good bleeding from the sartorius controlled with cautery.  I then mobilized it medially rotating it and tacked it to the inguinal ligament with interrupted 2-0 Vicryl suture.  I thoroughly irrigated the wound and then placed a wound VAC there.  We administered 50 mg of protamine.  The patient was then awakened from anesthesia she did tolerate procedure without any complication.  All counts were correct at completion.  EBL: 1500 cc.  Transfusion: 4 units packed red blood cells, 2 units FFP.   Verdia Bolt C. Randie Heinz, MD [facility] Office: [phone] Pager: [phone]

## 2020-06-15 NOTE — Progress Notes (Signed)
   Patient found to have mottled left lower extremity in the pacu with severe sensorimotor deficit. No signals obtained left foot. Right foot is warm with strong dp/pt signals and sensation and motor in tact. Will plan for re-exploration in OR with likely thrombectomy and angiogram of left lower extremity. Have attempted to update daughter, will try again prior to surgery. [facility] OR team has been contacted.   Raeana Blinn C. Randie Heinz, MD [facility]

## 2020-06-16 ENCOUNTER — Inpatient Hospital Stay (HOSPITAL_COMMUNITY): Payer: BC Managed Care – PPO

## 2020-06-16 DIAGNOSIS — J9601 Acute respiratory failure with hypoxia: Secondary | ICD-10-CM

## 2020-06-16 DIAGNOSIS — J9690 Respiratory failure, unspecified, unspecified whether with hypoxia or hypercapnia: Secondary | ICD-10-CM

## 2020-06-16 DIAGNOSIS — Z9889 Other specified postprocedural states: Secondary | ICD-10-CM

## 2020-06-16 DIAGNOSIS — L8990 Pressure ulcer of unspecified site, unspecified stage: Secondary | ICD-10-CM | POA: Insufficient documentation

## 2020-06-16 DIAGNOSIS — T827XXA Infection and inflammatory reaction due to other cardiac and vascular devices, implants and grafts, initial encounter: Principal | ICD-10-CM

## 2020-06-16 LAB — POCT I-STAT 7, (LYTES, BLD GAS, ICA,H+H)
Acid-base deficit: 6 mmol/L — ABNORMAL HIGH (ref 0.0–2.0)
Acid-base deficit: 8 mmol/L — ABNORMAL HIGH (ref 0.0–2.0)
Acid-base deficit: 10 mmol/L — ABNORMAL HIGH (ref 0.0–2.0)
Acid-base deficit: 9 mmol/L — ABNORMAL HIGH (ref 0.0–2.0)
Bicarbonate: 19.2 mmol/L — ABNORMAL LOW (ref 20.0–28.0)
Bicarbonate: 17 mmol/L — ABNORMAL LOW (ref 20.0–28.0)
Bicarbonate: 15.3 mmol/L — ABNORMAL LOW (ref 20.0–28.0)
Bicarbonate: 15 mmol/L — ABNORMAL LOW (ref 20.0–28.0)
Calcium, Ion: 0.95 mmol/L — ABNORMAL LOW (ref 1.15–1.40)
Calcium, Ion: 0.96 mmol/L — ABNORMAL LOW (ref 1.15–1.40)
Calcium, Ion: 0.83 mmol/L — CL (ref 1.15–1.40)
Calcium, Ion: 0.96 mmol/L — ABNORMAL LOW (ref 1.15–1.40)
HCT: 26 % — ABNORMAL LOW (ref 36.0–46.0)
HCT: 16 % — ABNORMAL LOW (ref 36.0–46.0)
HCT: 18 % — ABNORMAL LOW (ref 36.0–46.0)
HCT: 21 % — ABNORMAL LOW (ref 36.0–46.0)
Hemoglobin: 8.8 g/dL — ABNORMAL LOW (ref 12.0–15.0)
Hemoglobin: 5.4 g/dL — CL (ref 12.0–15.0)
Hemoglobin: 6.1 g/dL — CL (ref 12.0–15.0)
Hemoglobin: 7.1 g/dL — ABNORMAL LOW (ref 12.0–15.0)
O2 Saturation: 100 %
O2 Saturation: 100 %
O2 Saturation: 100 %
O2 Saturation: 99 %
Patient temperature: 36
Patient temperature: 36.3
Patient temperature: 36.7
Patient temperature: 98.4
Potassium: 4.7 mmol/L (ref 3.5–5.1)
Potassium: 5.4 mmol/L — ABNORMAL HIGH (ref 3.5–5.1)
Potassium: 5.1 mmol/L (ref 3.5–5.1)
Potassium: 4.1 mmol/L (ref 3.5–5.1)
Sodium: 138 mmol/L (ref 135–145)
Sodium: 138 mmol/L (ref 135–145)
Sodium: 140 mmol/L (ref 135–145)
Sodium: 141 mmol/L (ref 135–145)
TCO2: 20 mmol/L — ABNORMAL LOW (ref 22–32)
TCO2: 18 mmol/L — ABNORMAL LOW (ref 22–32)
TCO2: 16 mmol/L — ABNORMAL LOW (ref 22–32)
TCO2: 16 mmol/L — ABNORMAL LOW (ref 22–32)
pCO2 arterial: 36.3 mmHg (ref 32.0–48.0)
pCO2 arterial: 28.2 mmHg — ABNORMAL LOW (ref 32.0–48.0)
pCO2 arterial: 28.1 mmHg — ABNORMAL LOW (ref 32.0–48.0)
pCO2 arterial: 24.2 mmHg — ABNORMAL LOW (ref 32.0–48.0)
pH, Arterial: 7.327 — ABNORMAL LOW (ref 7.350–7.450)
pH, Arterial: 7.384 (ref 7.350–7.450)
pH, Arterial: 7.343 — ABNORMAL LOW (ref 7.350–7.450)
pH, Arterial: 7.398 (ref 7.350–7.450)
pO2, Arterial: 436 mmHg — ABNORMAL HIGH (ref 83.0–108.0)
pO2, Arterial: 511 mmHg — ABNORMAL HIGH (ref 83.0–108.0)
pO2, Arterial: 250 mmHg — ABNORMAL HIGH (ref 83.0–108.0)
pO2, Arterial: 128 mmHg — ABNORMAL HIGH (ref 83.0–108.0)

## 2020-06-16 LAB — CBC
HCT: 29.9 % — ABNORMAL LOW (ref 36.0–46.0)
Hemoglobin: 10.2 g/dL — ABNORMAL LOW (ref 12.0–15.0)
MCH: 29.7 pg (ref 26.0–34.0)
MCHC: 34.1 g/dL (ref 30.0–36.0)
MCV: 87.2 fL (ref 80.0–100.0)
Platelets: 98 10*3/uL — ABNORMAL LOW (ref 150–400)
RBC: 3.43 MIL/uL — ABNORMAL LOW (ref 3.87–5.11)
RDW: 15 % (ref 11.5–15.5)
WBC: 13.7 10*3/uL — ABNORMAL HIGH (ref 4.0–10.5)
nRBC: 0.1 % (ref 0.0–0.2)

## 2020-06-16 LAB — PREPARE FRESH FROZEN PLASMA: Unit division: 0

## 2020-06-16 LAB — BASIC METABOLIC PANEL
Anion gap: 12 (ref 5–15)
BUN: 17 mg/dL (ref 8–23)
CO2: 16 mmol/L — ABNORMAL LOW (ref 22–32)
Calcium: 7.1 mg/dL — ABNORMAL LOW (ref 8.9–10.3)
Chloride: 110 mmol/L (ref 98–111)
Creatinine, Ser: 0.9 mg/dL (ref 0.44–1.00)
GFR, Estimated: >60 mL/min (ref >60)
Glucose, Bld: 220 mg/dL — ABNORMAL HIGH (ref 70–99)
Potassium: 4.3 mmol/L (ref 3.5–5.1)
Sodium: 138 mmol/L (ref 135–145)

## 2020-06-16 LAB — BPAM FFP
Blood Product Expiration Date: 202112312359
Blood Product Expiration Date: 202112312359
ISSUE DATE / TIME: 202112301914
ISSUE DATE / TIME: 202112301914
Unit Type and Rh: 6200
Unit Type and Rh: 6200

## 2020-06-16 LAB — MAGNESIUM: Magnesium: 1.7 mg/dL (ref 1.7–2.4)

## 2020-06-16 LAB — PREPARE RBC (CROSSMATCH)

## 2020-06-16 LAB — GLUCOSE, CAPILLARY
Glucose-Capillary: 207 mg/dL — ABNORMAL HIGH (ref 70–99)
Glucose-Capillary: 204 mg/dL — ABNORMAL HIGH (ref 70–99)
Glucose-Capillary: 210 mg/dL — ABNORMAL HIGH (ref 70–99)
Glucose-Capillary: 203 mg/dL — ABNORMAL HIGH (ref 70–99)
Glucose-Capillary: 161 mg/dL — ABNORMAL HIGH (ref 70–99)

## 2020-06-16 LAB — TRIGLYCERIDES: Triglycerides: 148 mg/dL (ref <150)

## 2020-06-16 LAB — PHOSPHORUS: Phosphorus: 5 mg/dL — ABNORMAL HIGH (ref 2.5–4.6)

## 2020-06-16 MED ORDER — POLYETHYLENE GLYCOL 3350 17 G PO PACK
17.0000 g | PACK | Freq: Every day | ORAL | Status: DC
  Administered 2020-06-17 – 2020-06-28 (×8): 17 g via ORAL
  Filled 2020-06-16 (×10): qty 1

## 2020-06-16 MED ORDER — NOREPINEPHRINE 4 MG/250ML-% IV SOLN: 2.0000 ug/min | INTRAVENOUS | Status: DC

## 2020-06-16 MED ORDER — SODIUM CHLORIDE 0.9 % IV SOLN: 250.0000 mL | INTRAVENOUS | Status: DC

## 2020-06-16 MED ORDER — IPRATROPIUM-ALBUTEROL 0.5-2.5 (3) MG/3ML IN SOLN
3.0000 mL | Freq: Four times a day (QID) | RESPIRATORY_TRACT | Status: DC
  Administered 2020-06-16 (×3): 3 mL via RESPIRATORY_TRACT
  Filled 2020-06-16 (×3): qty 3

## 2020-06-16 MED ORDER — ASPIRIN 81 MG PO CHEW
81.0000 mg | CHEWABLE_TABLET | Freq: Every day | ORAL | Status: DC
  Administered 2020-06-17 – 2020-07-02 (×14): 81 mg via ORAL
  Filled 2020-06-16 (×14): qty 1

## 2020-06-16 MED ORDER — FENTANYL CITRATE (PF) 100 MCG/2ML IJ SOLN
25.0000 ug | INTRAMUSCULAR | Status: DC | PRN
  Administered 2020-06-16: 25 ug via INTRAVENOUS
  Filled 2020-06-16: qty 2

## 2020-06-16 MED ORDER — NOREPINEPHRINE 4 MG/250ML-% IV SOLN: 0.0000 ug/min | INTRAVENOUS | Status: DC

## 2020-06-16 MED ORDER — FUROSEMIDE 10 MG/ML IJ SOLN
40.0000 mg | Freq: Once | INTRAMUSCULAR | Status: AC
  Administered 2020-06-16: 40 mg via INTRAVENOUS
  Filled 2020-06-16: qty 4

## 2020-06-16 MED ORDER — CALCIUM GLUCONATE-NACL 1-0.675 GM/50ML-% IV SOLN
1.0000 g | Freq: Once | INTRAVENOUS | Status: AC
  Administered 2020-06-16: 1000 mg via INTRAVENOUS
  Filled 2020-06-16: qty 50

## 2020-06-16 MED ORDER — SODIUM CHLORIDE 0.9% IV SOLUTION: Freq: Once | INTRAVENOUS | Status: DC

## 2020-06-16 MED ORDER — ATORVASTATIN CALCIUM 40 MG PO TABS
40.0000 mg | ORAL_TABLET | Freq: Every day | ORAL | Status: DC
  Administered 2020-06-17 – 2020-07-02 (×14): 40 mg via ORAL
  Filled 2020-06-16 (×14): qty 1

## 2020-06-16 MED ORDER — ASPIRIN 81 MG PO CHEW
81.0000 mg | CHEWABLE_TABLET | Freq: Every day | ORAL | Status: DC
  Administered 2020-06-16: 81 mg
  Filled 2020-06-16: qty 1

## 2020-06-16 MED ORDER — CALCIUM CHLORIDE 10 % IV SOLN
INTRAVENOUS | Status: DC | PRN
  Administered 2020-06-16: 300 mg via INTRAVENOUS
  Administered 2020-06-16: 500 mg via INTRAVENOUS
  Administered 2020-06-16: 200 mg via INTRAVENOUS

## 2020-06-16 MED ORDER — SODIUM CHLORIDE 0.9% IV SOLUTION: Freq: Once | INTRAVENOUS | Status: AC

## 2020-06-16 MED ORDER — PROPOFOL 1000 MG/100ML IV EMUL
0.0000 ug/kg/min | INTRAVENOUS | Status: DC
  Administered 2020-06-16: 50 ug/kg/min via INTRAVENOUS
  Filled 2020-06-16: qty 100

## 2020-06-16 MED ORDER — FENTANYL CITRATE (PF) 100 MCG/2ML IJ SOLN
25.0000 ug | INTRAMUSCULAR | Status: DC | PRN
  Administered 2020-06-16: 100 ug via INTRAVENOUS
  Filled 2020-06-16 (×2): qty 2

## 2020-06-16 MED ORDER — SODIUM CHLORIDE 0.9 % IV SOLN
INTRAVENOUS | Status: AC
  Filled 2020-06-16: qty 1.2

## 2020-06-16 MED ORDER — DOCUSATE SODIUM 50 MG/5ML PO LIQD
100.0000 mg | Freq: Two times a day (BID) | ORAL | Status: DC
  Administered 2020-06-17 – 2020-07-02 (×25): 100 mg via ORAL
  Filled 2020-06-16 (×25): qty 10

## 2020-06-16 MED ORDER — DEXTROSE 5 % IV SOLN
INTRAVENOUS | Status: AC
  Filled 2020-06-16: qty 1000

## 2020-06-16 MED ORDER — CALCIUM CHLORIDE 10 % IV SOLN
INTRAVENOUS | Status: AC
  Filled 2020-06-16: qty 10

## 2020-06-16 MED ORDER — PANTOPRAZOLE SODIUM 40 MG IV SOLR
40.0000 mg | Freq: Every day | INTRAVENOUS | Status: DC
  Administered 2020-06-16 – 2020-06-21 (×5): 40 mg via INTRAVENOUS
  Filled 2020-06-16 (×5): qty 40

## 2020-06-16 MED ORDER — NOREPINEPHRINE 4 MG/250ML-% IV SOLN
INTRAVENOUS | Status: AC
  Administered 2020-06-16: 4 mg
  Filled 2020-06-16: qty 250

## 2020-06-16 MED ORDER — HEMOSTATIC AGENTS (NO CHARGE) OPTIME
TOPICAL | Status: DC | PRN
  Administered 2020-06-16: 1 via TOPICAL

## 2020-06-16 MED ORDER — ATORVASTATIN CALCIUM 40 MG PO TABS
40.0000 mg | ORAL_TABLET | Freq: Every day | ORAL | Status: DC
  Administered 2020-06-16: 40 mg
  Filled 2020-06-16: qty 1

## 2020-06-16 MED ORDER — IPRATROPIUM-ALBUTEROL 0.5-2.5 (3) MG/3ML IN SOLN
3.0000 mL | Freq: Two times a day (BID) | RESPIRATORY_TRACT | Status: DC
  Administered 2020-06-17: 3 mL via RESPIRATORY_TRACT
  Filled 2020-06-16: qty 3

## 2020-06-16 MED ORDER — PROPOFOL 500 MG/50ML IV EMUL
INTRAVENOUS | Status: DC | PRN
  Administered 2020-06-16: 75 ug/kg/min via INTRAVENOUS

## 2020-06-16 MED ORDER — CHLORHEXIDINE GLUCONATE CLOTH 2 % EX PADS
6.0000 | MEDICATED_PAD | Freq: Every day | CUTANEOUS | Status: DC
  Administered 2020-06-16 – 2020-07-16 (×26): 6 via TOPICAL

## 2020-06-16 MED ORDER — POLYETHYLENE GLYCOL 3350 17 G PO PACK: 17.0000 g | PACK | Freq: Every day | ORAL | Status: DC

## 2020-06-16 MED ORDER — ZOLPIDEM TARTRATE 5 MG PO TABS
5.0000 mg | ORAL_TABLET | Freq: Every evening | ORAL | Status: DC | PRN
  Administered 2020-06-16 – 2020-07-01 (×6): 5 mg via ORAL
  Filled 2020-06-16 (×6): qty 1

## 2020-06-16 MED ORDER — HYDRALAZINE HCL 20 MG/ML IJ SOLN
10.0000 mg | INTRAMUSCULAR | Status: DC | PRN
  Administered 2020-06-16: 40 mg via INTRAVENOUS
  Administered 2020-06-16: 10 mg via INTRAVENOUS
  Administered 2020-06-17 (×2): 20 mg via INTRAVENOUS
  Administered 2020-06-18: 40 mg via INTRAVENOUS
  Administered 2020-06-18: 10:00:00 20 mg via INTRAVENOUS
  Administered 2020-06-19: 40 mg via INTRAVENOUS
  Administered 2020-06-19: 20 mg via INTRAVENOUS
  Administered 2020-06-20 – 2020-06-21 (×4): 40 mg via INTRAVENOUS
  Administered 2020-06-26: 20 mg via INTRAVENOUS
  Administered 2020-06-27 – 2020-06-29 (×4): 30 mg via INTRAVENOUS
  Administered 2020-07-05 – 2020-07-12 (×2): 20 mg via INTRAVENOUS
  Administered 2020-07-15: 10 mg via INTRAVENOUS
  Filled 2020-06-16 (×4): qty 2
  Filled 2020-06-16 (×2): qty 1
  Filled 2020-06-16 (×2): qty 2
  Filled 2020-06-16: qty 1
  Filled 2020-06-16: qty 2
  Filled 2020-06-16: qty 1
  Filled 2020-06-16 (×2): qty 2
  Filled 2020-06-16: qty 1
  Filled 2020-06-16: qty 2
  Filled 2020-06-16: qty 1
  Filled 2020-06-16 (×4): qty 2

## 2020-06-16 MED ORDER — HEPARIN (PORCINE) 25000 UT/250ML-% IV SOLN
500.0000 [IU]/h | INTRAVENOUS | Status: DC
  Administered 2020-06-16: 500 [IU]/h via INTRAVENOUS
  Filled 2020-06-16: qty 250

## 2020-06-16 MED ORDER — DOCUSATE SODIUM 50 MG/5ML PO LIQD
100.0000 mg | Freq: Two times a day (BID) | ORAL | Status: DC
  Administered 2020-06-16: 100 mg
  Filled 2020-06-16: qty 10

## 2020-06-16 NOTE — Consult Note (Addendum)
NAME:  Holly Stevenson, MRN:  [id number], DOB:  September 23, 1950, LOS: 2 ADMISSION DATE:  06/14/2020, CONSULTATION DATE:  06/16/20 REFERRING MD:  Randie Heinz  CHIEF COMPLAINT:  Vent Management   Brief History   Holly Stevenson is a 69 y.o. female who has had multiple vascular surgeries, most recently L common femoral and external iliac endarterectomy with vein patch angioplasty, bilateral common iliac artery stenting, stenting of left external iliac artery, L popliteal artery endarterectomy, L common femoral to above knee popliteal bypass, L lower extremity thromboembolectomy on 12/31.  She was left intubated post op and PCCM asked to assist with vent management.  History of present illness   Pt is encephelopathic; therefore, this HPI is obtained from chart review. Holly Stevenson is a 69 y.o. female who has a PMH as outlined below.  She has had multiple vascular surgeries (see significant events below), most recently L common femoral and external iliac endarterectomy with vein patch angioplasty, bilateral common iliac artery stenting, stenting of left external iliac artery, L popliteal artery endarterectomy, L common femoral to above knee popliteal bypass, L lower extremity thromboembolectomy on 12/31. on 12/31.  She was left intubated post op and PCCM asked to assist with vent management.  Of note, she had navigational bronchoscopy of RUL mass 12/21 with Dr. Delton Coombes.  Path was negative for malignancy but showed granulomas of unclear etiology.  Past Medical History  has Diabetic infection of left foot (HCC); DM2 (diabetes mellitus, type 2) (HCC); CKD (chronic kidney disease) stage 3, GFR 30-59 ml/min (HCC); HTN (hypertension); Cavitary lesion of lung; Cellulitis of toe of left foot; Idiopathic chronic gout of left knee without tophus; Gangrene of toe of left foot (HCC); PAD (peripheral artery disease) (HCC); Critical lower limb ischemia (HCC); COPD (chronic obstructive pulmonary disease) (HCC);  Diabetic renal disease (HCC); Hyperlipidemia; Personal history of colonic polyps; Polyneuropathy; Tobacco user; Infected prosthetic vascular graft (HCC); and Status post surgery on their problem list.  Significant Hospital Events   12/16 > exploration of right groin and control of hemorrhage, thrombectomy right to left femoral bypass graft, interposition grafting right common femoral artery to femoral femoral bypass, thrombectomy left femoral popliteal bypass graft, angiogram LLE. 12/21 > EBUS of RUL > granulomas. 12/29 > sharp excisional debridement right groin wound, pulsavac irrigation right groin wound, interposition graft of femorofemoral bypass. 12/30 > Excision of right to left femorofemoral bypass, right common femoral bovine pericardial patch and left femoral to popliteal artery bypass graft with reopening of left groin and left above knee popliteal exposure, harvest of right greater saphenous vein, patch angioplasty of right common femoral artery, left common femoral endarterectomy and saphenous vein angioplasty, left popliteal artery vein angioplasty, aortogram, stent of left common and external iliac veins, stent of L SFA, sartorius muscle flap right groin wound. 12/31 > L common femoral and external iliac endarterectomy with vein patch angioplasty, harvest L greater saphenous vein, aortogram with bilateral iliofemoral runoff,  bilateral common iliac artery stenting, stenting of left external iliac artery, L popliteal artery endarterectomy, L common femoral to above knee popliteal bypass, L lower extremity thromboembolectomy  Consults:  PCCM.  Procedures:  ETT 12/31 >  R radial art line 12/31 >   Significant Diagnostic Tests:  None.  Micro Data:  COVID 12/29 > neg. Right groin wound 12/29 > neg.  Antimicrobials:  Vanc 12/29 >  Zosyn 12/29 >    Interim history/subjective:  Sedated.  Objective:  Blood pressure (!) 227/51, pulse 67, temperature 97.9 F (36.6  C), temperature  source Axillary, resp. rate 16, height 5\' 8"  (1.727 m), weight 81.6 kg, SpO2 97 %.    Vent Mode: PRVC FiO2 (%):  [40 %] 40 % Set Rate:  [18 bmp] 18 bmp Vt Set:  [510 mL] 510 mL PEEP:  [5 cmH20] 5 cmH20 Plateau Pressure:  [13 cmH20] 13 cmH20   Intake/Output Summary (Last 24 hours) at 06/16/2020 0438 Last data filed at 06/16/2020 0347 Gross per 24 hour  Intake 12079.96 ml  Output 4375 ml  Net 7704.96 ml   Filed Weights   06/14/20 0853  Weight: 81.6 kg    Examination: General: Adult female, in NAD. Neuro: Sedated, not responsive. HEENT: [place]/AT. Sclerae anicteric.  ETT in place. Cardiovascular: RRR, no M/R/G.  Lungs: Respirations even and unlabored.  CTA bilaterally, No W/R/R.  Abdomen: BS x 4, soft, NT/ND.  Musculoskeletal: Multiple incisions bilateral LE's with dressings C/D/I. Skin: Intact, warm, no rashes.  Assessment & Plan:   Acute left lower extremity ischemia - s/p OR 12/31.  She has had multiple extensive vascular surgeries on bilateral LE's with significant blood loss s/p multiple transfusions.  See significant events above for a summary. - Post op care per vascular.  Infected right femoral graft - s/p OR exploration. - Continue vanc / zosyn and follow cultures.  ABLA - in setting above, s/p multiple transfusions.  Latest Hgb from 0330 was 6.1.  Heparin being started now given ischemia. - Additional 2u PRBC now. - Transfuse for Hgb < 7.  Acute respiratory failure - 2/2 above. - Full vent support. - SBT in AM for hopeful extubation if stable. - Follow CXR.  HTN. - Hydralazine and Labetalol PRN. - Hold Benazepril.  Hx HLD. - Continue atorvastatin.  Hx DM. - SSI. - Hold metformin.  Hypocalcemia. - 1g Ca gluconate.  Concern for RUL malignancy - EBUS 12/21 with negative path.  Remains highly suspicious. - Follow up PET scan planned. - F/u with Dr. 1/22.  Best Practice:  Diet: TF's. Pain/Anxiety/Delirium protocol (if indicated): Propofol gtt /  Fentanyl PRN.  VAP protocol (if indicated): In place. DVT prophylaxis: Heparin. GI prophylaxis: PPI. Glucose control: SSI. Mobility: Bedrest. Last date of multidisciplinary goals of care discussion: None. Family and staff present: None. Summary of discussion: None. Follow up goals of care discussion due: 1/6. Code Status: Full. Family Communication: Per primary. Disposition: ICU.  Labs   CBC: Recent Labs  Lab 06/15/20 0054 06/15/20 1044 06/15/20 1848 06/15/20 2009 06/15/20 2219 06/16/20 0154 06/16/20 0336  WBC 10.4  --   --   --   --   --   --   HGB 6.4*   < > 7.1* 7.1* 8.8* 5.4* 6.1*  HCT 21.7*   < > 21.0* 21.0* 26.0* 16.0* 18.0*  MCV 96.0  --   --   --   --   --   --   PLT 246  --   --   --   --   --   --    < > = values in this interval not displayed.   Basic Metabolic Panel: Recent Labs  Lab 06/14/20 1003 06/15/20 0054 06/15/20 1445 06/15/20 1613 06/15/20 1717 06/15/20 1848 06/15/20 2009 06/15/20 2219 06/16/20 0154 06/16/20 0336  NA 135 136 136 136   < > 135 137 138 138 140  K 4.5 4.7 4.5 4.9   < > 5.7* 5.6* 4.7 5.4* 5.1  CL 102 103 104 104  --   --   --   --   --   --  CO2  --  22  --   --   --   --   --   --   --   --   GLUCOSE 141* 169* 132* 159*  --   --   --   --   --   --   BUN 13 14 12 13   --   --   --   --   --   --   CREATININE 0.50 0.81 0.50 0.50  --   --   --   --   --   --   CALCIUM  --  7.7*  --   --   --   --   --   --   --   --    < > = values in this interval not displayed.   GFR: Estimated Creatinine Clearance: 73.3 mL/min (by C-G formula based on SCr of 0.5 mg/dL). Recent Labs  Lab 06/15/20 0054  WBC 10.4   Liver Function Tests: No results for input(s): AST, ALT, ALKPHOS, BILITOT, PROT, ALBUMIN in the last 168 hours. No results for input(s): LIPASE, AMYLASE in the last 168 hours. No results for input(s): AMMONIA in the last 168 hours. ABG    Component Value Date/Time   PHART 7.343 (L) 06/16/2020 0336   PCO2ART 28.1 (L)  06/16/2020 0336   PO2ART 250 (H) 06/16/2020 0336   HCO3 15.3 (L) 06/16/2020 0336   TCO2 16 (L) 06/16/2020 0336   ACIDBASEDEF 10.0 (H) 06/16/2020 0336   O2SAT 100.0 06/16/2020 0336    Coagulation Profile: No results for input(s): INR, PROTIME in the last 168 hours. Cardiac Enzymes: No results for input(s): CKTOTAL, CKMB, CKMBINDEX, TROPONINI in the last 168 hours. HbA1C: Hgb A1c MFr Bld  Date/Time Value Ref Range Status  05/11/2020 04:26 AM 7.2 (H) 4.8 - 5.6 % Final    Comment:    (NOTE) Pre diabetes:          5.7%-6.4%  Diabetes:              >6.4%  Glycemic control for   <7.0% adults with diabetes   04/25/2020 05:44 AM 7.3 (H) 4.8 - 5.6 % Final    Comment:    (NOTE) Pre diabetes:          5.7%-6.4%  Diabetes:              >6.4%  Glycemic control for   <7.0% adults with diabetes    CBG: Recent Labs  Lab 06/14/20 0901 06/14/20 1101 06/14/20 1503 06/15/20 1148 06/15/20 2032  GLUCAP 145* 130* 126* 117* 218*    Review of Systems:   Unable to obtain as pt is encephalopathic.  Past medical history  She,  has a past medical history of Diabetes mellitus without complication (HCC), High cholesterol, Hypertension, and Pneumonia.   Surgical History    Past Surgical History:  Procedure Laterality Date  . ABDOMINAL AORTOGRAM W/LOWER EXTREMITY Bilateral 04/28/2020   Procedure: ABDOMINAL AORTOGRAM W/LOWER EXTREMITY;  Surgeon: 13/05/2020, MD;  [facility] INVASIVE CV LAB;  Service: Cardiovascular;  Laterality: Bilateral;  . BRONCHIAL BIOPSY  05/23/2020   Procedure: BRONCHIAL BIOPSIES;  Surgeon: 14/12/2019, MD;  Location: [facility] ENDOSCOPY;  Service: Cardiopulmonary;;  . BRONCHIAL BIOPSY  06/06/2020   Procedure: BRONCHIAL BIOPSIES;  Surgeon: 06/08/2020, MD;  Location: [facility] ENDOSCOPY;  Service: Pulmonary;;  . BRONCHIAL BRUSHINGS  05/23/2020   Procedure: BRONCHIAL BRUSHINGS;  Surgeon: 14/12/2019, MD;  Location: [facility]  ENDOSCOPY;  Service: Cardiopulmonary;;  .  BRONCHIAL BRUSHINGS  06/06/2020   Procedure: BRONCHIAL BRUSHINGS;  Surgeon: Leslye PeerByrum, Robert S, MD;  [facility];  Service: Pulmonary;;  . BRONCHIAL NEEDLE ASPIRATION BIOPSY  05/23/2020   Procedure: BRONCHIAL NEEDLE ASPIRATION BIOPSIES;  Surgeon: Leslye PeerByrum, Robert S, MD;  [facility];  Service: Cardiopulmonary;;  . BRONCHIAL NEEDLE ASPIRATION BIOPSY  06/06/2020   Procedure: BRONCHIAL NEEDLE ASPIRATION BIOPSIES;  Surgeon: Leslye PeerByrum, Robert S, MD;  [facility];  Service: Pulmonary;;  . BRONCHIAL WASHINGS  05/23/2020   Procedure: BRONCHIAL WASHINGS;  Surgeon: Leslye PeerByrum, Robert S, MD;  [facility];  Service: Cardiopulmonary;;  . BRONCHIAL WASHINGS  06/06/2020   Procedure: BRONCHIAL WASHINGS;  Surgeon: Leslye PeerByrum, Robert S, MD;  [facility];  Service: Pulmonary;;  . COLONOSCOPY W/ BIOPSIES AND POLYPECTOMY    . ELECTROMAGNETIC NAVIGATION BROCHOSCOPY N/A 05/23/2020   Procedure: ELECTROMAGNETIC NAVIGATION BRONCHOSCOPY;  Surgeon: Leslye PeerByrum, Robert S, MD;  Location: [facility] ENDOSCOPY;  Service: Cardiopulmonary;  Laterality: N/A;  . ENDARTERECTOMY FEMORAL Right 05/10/2020   Procedure: ENDARTERECTOMY RIGHT FEMORAL;  Surgeon: Leonie DouglasHawken, Thomas N, MD;  [facility];  Service: Vascular;  Laterality: Right;  . FEMORAL-FEMORAL BYPASS GRAFT Bilateral 05/10/2020   Procedure: RIGHT TO LEFT FEMORAL-FEMORAL ARTERY BYPASS GRAFT;  Surgeon: Leonie DouglasHawken, Thomas N, MD;  [facility];  Service: Vascular;  Laterality: Bilateral;  . FEMORAL-FEMORAL BYPASS GRAFT Right 06/01/2020   Procedure: INTERPOSITION GRAFT RIGHT COMMON FEMORAL TO FEMORAL-FEMORAL BYPASS;  Surgeon: Leonie DouglasHawken, Thomas N, MD;  [facility];  Service: Vascular;  Laterality: Right;  . FEMORAL-FEMORAL BYPASS GRAFT Right 06/14/2020   Procedure: interposition FEMORAL-FEMORAL ARTERY bypass using 6mm graft;  Surgeon: Maeola Harmanain, Brandon Christopher, MD;  [facility];  Service: Vascular;  Laterality: Right;  . FEMORAL-POPLITEAL BYPASS GRAFT Left  05/10/2020   Procedure: LEFT FEMORAL-POPLITEAL ARTERY BYPASS GRAFT;  Surgeon: Leonie DouglasHawken, Thomas N, MD;  [facility];  Service: Vascular;  Laterality: Left;  . GROIN DISSECTION Right 06/01/2020   Procedure: RIGHT GROIN EXPLORATION;  Surgeon: Leonie DouglasHawken, Thomas N, MD;  [facility];  Service: Vascular;  Laterality: Right;  . INTRAOPERATIVE ARTERIOGRAM Left 06/01/2020   Procedure: LEFT LOWER EXTREMITY  ARTERIOGRAM;  Surgeon: Leonie DouglasHawken, Thomas N, MD;  [facility];  Service: Vascular;  Laterality: Left;  . LOWER EXTREMITY ANGIOGRAM Left 05/10/2020   Procedure: LEFT LOWER EXTREMITY ANGIOGRAM;  Surgeon: Leonie DouglasHawken, Thomas N, MD;  [facility];  Service: Vascular;  Laterality: Left;  . PATCH ANGIOPLASTY Right 05/10/2020   Procedure: PATCH ANGIOPLASTY USING Kathleen LimeXENOSURE PATCH;  Surgeon: Leonie DouglasHawken, Thomas N, MD;  [facility];  Service: Vascular;  Laterality: Right;  . PERIPHERAL VASCULAR BALLOON ANGIOPLASTY Left 04/28/2020   Procedure: PERIPHERAL VASCULAR BALLOON ANGIOPLASTY;  Surgeon: Sherren KernsFields, Charles E, MD;  [facility]: [facility] INVASIVE CV LAB;  Service: Cardiovascular;  Laterality: Left;  Unable to cross left iliac lesion to intervene  . THROMBECTOMY FEMORAL ARTERY N/A 06/01/2020   Procedure: THROMBECTOMY FEMORAL- FEMORAL BYPASS;  Surgeon: Leonie DouglasHawken, Thomas N, MD;  [facility] OR;  Service: Vascular;  Laterality: N/A;  . THROMBECTOMY FEMORAL- FEMORAL BYPASS (N/A )  06/01/2020  . THROMBECTOMY OF BYPASS GRAFT FEMORAL- POPLITEAL ARTERY Left 06/01/2020   Procedure: THROMBECTOMY OF LEFT BYPASS GRAFT FEMORAL-POPLITEAL ARTERY;  Surgeon: Leonie DouglasHawken, Thomas N, MD;  [facility];  Service: Vascular;  Laterality: Left;  . THROMBECTOMY OF LEFT BYPASS GRAFT FEMORAL-POPLITEAL ARTERY (Left )  06/01/2020  . VASCULAR SURGERY  04/2020  . VIDEO BRONCHOSCOPY N/A 05/23/2020   Procedure: VIDEO BRONCHOSCOPY WITH FLUORO;  Surgeon: Leslye Peer, MD;  Location: [facility] ENDOSCOPY;  Service: Cardiopulmonary;  Laterality: N/A;  . VIDEO  BRONCHOSCOPY WITH ENDOBRONCHIAL NAVIGATION Right 06/06/2020   Procedure: VIDEO BRONCHOSCOPY WITH ENDOBRONCHIAL NAVIGATION;  Surgeon: Leslye Peer, MD;  [facility]: [facility] ENDOSCOPY;  Service: Pulmonary;  Laterality: Right;  . WOUND DEBRIDEMENT Right 06/14/2020   Procedure: RIGHT GROIN DEBRIDEMENT with pulsatile jet lavage of 11cm x 4cm x 4cm wound;  Surgeon: Maeola Harman, MD;  [facility];  Service: Vascular;  Laterality: Right;     Social History   reports that she has been smoking cigarettes. She has a 6.00 pack-year smoking history. She has never used smokeless tobacco. She reports previous alcohol use. She reports that she does not use drugs.   Family history   Her family history is negative for Peripheral Artery Disease.   Allergies No Known Allergies   Home meds  Prior to Admission medications   Medication Sig Start Date End Date Taking? Authorizing Provider  acetaminophen (TYLENOL) 500 MG tablet Take 1,500 mg by mouth 3 (three) times daily as needed for moderate pain or headache.   Yes [provider]  apixaban (ELIQUIS) 5 MG TABS tablet Take 1 tablet (5 mg total) by mouth 2 (two) times daily. 06/07/20  Yes Lars Mage, PA-C  aspirin EC 81 MG EC tablet Take 1 tablet (81 mg total) by mouth daily at 6 (six) AM. Swallow whole. 05/13/20  Yes Clinton Gallant M, PA-C  atorvastatin (LIPITOR) 40 MG tablet Take 1 tablet (40 mg total) by mouth daily. 05/13/20  Yes Clinton Gallant M, PA-C  benazepril (LOTENSIN) 10 MG tablet Take 10 mg by mouth daily. 02/11/20  Yes [provider]  cholecalciferol (VITAMIN D3) 25 MCG (1000 UNIT) tablet Take 1,000 Units by mouth daily.   Yes [provider]  feeding supplement, GLUCERNA SHAKE, (GLUCERNA SHAKE) LIQD Take 237 mLs by mouth 3 (three) times daily between meals. 04/29/20  Yes Swayze, Ava, DO  JARDIANCE 25 MG TABS tablet Take 25 mg by mouth daily. 04/16/20  Yes [provider]  metFORMIN (GLUCOPHAGE) 500  MG tablet Take 1,000 mg by mouth 2 (two) times daily.  04/30/20  Yes [provider]  Multiple Vitamins-Minerals (MULTIVITAMIN WITH MINERALS) tablet Take 1 tablet by mouth daily.   Yes [provider]  oxyCODONE-acetaminophen (PERCOCET/ROXICET) 5-325 MG tablet Take 1 tablet by mouth every 6 (six) hours as needed for moderate pain. 06/07/20  Yes Lars Mage, PA-C  Tiotropium Bromide Monohydrate (SPIRIVA RESPIMAT) 1.25 MCG/ACT AERS Inhale 2 puffs into the lungs daily. 05/29/20   Leslye Peer, MD    Critical care time: 60 min.    Rutherford Guys, [place] [facility] For pager details, please see AMION 06/16/2020, 4:38 AM

## 2020-06-16 NOTE — Consult Note (Signed)
NAME:  Holly Stevenson, MRN:  [id number], DOB:  Aug 09, 1950, LOS: 2 ADMISSION DATE:  06/14/2020, CONSULTATION DATE:  06/16/20 REFERRING MD:  Randie Heinz  CHIEF COMPLAINT:  Vent Management   Brief History   Holly Stevenson is a 69 y.o. female who has had multiple vascular surgeries, most recently L common femoral and external iliac endarterectomy with vein patch angioplasty, bilateral common iliac artery stenting, stenting of left external iliac artery, L popliteal artery endarterectomy, L common femoral to above knee popliteal bypass, L lower extremity thromboembolectomy on 12/31.  She was left intubated post op and PCCM asked to assist with vent management.  History of present illness   Pt is encephelopathic; therefore, this HPI is obtained from chart review. Holly Stevenson is a 69 y.o. female who has a PMH as outlined below.  She has had multiple vascular surgeries (see significant events below), most recently L common femoral and external iliac endarterectomy with vein patch angioplasty, bilateral common iliac artery stenting, stenting of left external iliac artery, L popliteal artery endarterectomy, L common femoral to above knee popliteal bypass, L lower extremity thromboembolectomy on 12/31. on 12/31.  She was left intubated post op and PCCM asked to assist with vent management.  Of note, she had navigational bronchoscopy of RUL mass 12/21 with Dr. Delton Coombes.  Path was negative for malignancy but showed granulomas of unclear etiology.  Past Medical History  has Diabetic infection of left foot (HCC); DM2 (diabetes mellitus, type 2) (HCC); CKD (chronic kidney disease) stage 3, GFR 30-59 ml/min (HCC); HTN (hypertension); Cavitary lesion of lung; Cellulitis of toe of left foot; Idiopathic chronic gout of left knee without tophus; Gangrene of toe of left foot (HCC); PAD (peripheral artery disease) (HCC); Critical lower limb ischemia (HCC); COPD (chronic obstructive pulmonary disease) (HCC);  Diabetic renal disease (HCC); Hyperlipidemia; Personal history of colonic polyps; Polyneuropathy; Tobacco user; Infected prosthetic vascular graft (HCC); Status post surgery; Respiratory failure (HCC); and Pressure injury of skin on their problem list.  Significant Hospital Events   12/16 > exploration of right groin and control of hemorrhage, thrombectomy right to left femoral bypass graft, interposition grafting right common femoral artery to femoral femoral bypass, thrombectomy left femoral popliteal bypass graft, angiogram LLE. 12/21 > EBUS of RUL > granulomas. 12/29 > sharp excisional debridement right groin wound, pulsavac irrigation right groin wound, interposition graft of femorofemoral bypass. 12/30 > Excision of right to left femorofemoral bypass, right common femoral bovine pericardial patch and left femoral to popliteal artery bypass graft with reopening of left groin and left above knee popliteal exposure, harvest of right greater saphenous vein, patch angioplasty of right common femoral artery, left common femoral endarterectomy and saphenous vein angioplasty, left popliteal artery vein angioplasty, aortogram, stent of left common and external iliac veins, stent of L SFA, sartorius muscle flap right groin wound. 12/31 > L common femoral and external iliac endarterectomy with vein patch angioplasty, harvest L greater saphenous vein, aortogram with bilateral iliofemoral runoff,  bilateral common iliac artery stenting, stenting of left external iliac artery, L popliteal artery endarterectomy, L common femoral to above knee popliteal bypass, L lower extremity thromboembolectomy  Consults:  PCCM.  Procedures:  ETT 12/31 >  R radial art line 12/31 >   Significant Diagnostic Tests:  None.  Micro Data:  COVID 12/29 > neg. Right groin wound 12/29 > neg.  Antimicrobials:  Vanc 12/29 >  Zosyn 12/29 >    Interim history/subjective:  Initially sedated on propofol.  Markedly hypertensive.  Significant pressure drop with administration of pain medications.  Transiently on Levophed.  Blood pressure eventually recovered.  Patient now awake and mouthing words.  Objective:  Blood pressure (!) 149/47, pulse (!) 55, temperature 98.6 F (37 C), temperature source Axillary, resp. rate 16, height 5\' 8"  (1.727 m), weight 81.6 kg, SpO2 99 %.    Vent Mode: PRVC FiO2 (%):  [40 %] 40 % Set Rate:  [18 bmp] 18 bmp Vt Set:  [510 mL] 510 mL PEEP:  [5 cmH20] 5 cmH20 Plateau Pressure:  [13 cmH20-14 cmH20] 14 cmH20   Intake/Output Summary (Last 24 hours) at 06/16/2020 1314 Last data filed at 06/16/2020 1129 Gross per 24 hour  Intake 11835.56 ml  Output 5745 ml  Net 6090.56 ml   Filed Weights   06/14/20 0853  Weight: 81.6 kg    Examination: General: Adult female, in NAD. Neuro: Awake indicating that she is thirsty.  Able to sit up on her own strength. HEENT: [place]/AT. Sclerae anicteric.  ETT in place. Cardiovascular: RRR, no M/R/G.  Lungs: Respirations even and unlabored.  CTA bilaterally, No W/R/R.  Abdomen: BS x 4, soft, NT/ND.  Musculoskeletal: Bilateral VAC dressings.  Generalized erythema.  No pain to palpation  skin: Intact, warm, no rashes.  Assessment & Plan:   Acute left lower extremity ischemia - s/p OR 12/31.  She has had multiple extensive vascular surgeries on bilateral LE's with significant blood loss s/p multiple transfusions.  See significant events above for a summary. - Post op care per vascular.  Infected right femoral graft - s/p OR exploration. - Continue vanc / zosyn and follow cultures.  ABLA - in setting above, s/p multiple transfusions.  Latest Hgb from 0330 was 6.1.  Heparin being started now given ischemia. - Additional 2u PRBC now. - Transfuse for Hgb < 7.  Acute respiratory failure - 2/2 above. -SBT and extubate this morning. -Initiate diuresis as 8 L positive.  HTN. - Hydralazine and Labetalol PRN. - Hold Benazepril.  Hx HLD. - Continue  atorvastatin.  Hx DM. - SSI. - Hold metformin.  Hypocalcemia. - 1g Ca gluconate.  Concern for RUL malignancy - EBUS 12/21 with negative path.  Remains highly suspicious. - Follow up PET scan planned. - F/u with Dr. Delton CoombesByrum.  Best Practice:  Diet:  NPO Pain/Anxiety/Delirium protocol (if indicated): Propofol gtt / Fentanyl PRN.  VAP protocol (if indicated): In place. DVT prophylaxis: Heparin. GI prophylaxis: PPI. Glucose control: SSI. Mobility: Bedrest. Last date of multidisciplinary goals of care discussion: None. Family and staff present: None. Summary of discussion: None. Follow up goals of care discussion due: 1/6. Code Status: Full. Family Communication: Per primary. Disposition: ICU.  Labs   CBC: Recent Labs  Lab 06/15/20 0054 06/15/20 1044 06/15/20 2219 06/16/20 0154 06/16/20 0336 06/16/20 0608 06/16/20 0825  WBC 10.4  --   --   --   --   --  13.7*  HGB 6.4*   < > 8.8* 5.4* 6.1* 7.1* 10.2*  HCT 21.7*   < > 26.0* 16.0* 18.0* 21.0* 29.9*  MCV 96.0  --   --   --   --   --  87.2  PLT 246  --   --   --   --   --  98*   < > = values in this interval not displayed.   Basic Metabolic Panel: Recent Labs  Lab 06/14/20 1003 06/15/20 0054 06/15/20 1445 06/15/20 1613 06/15/20 1717 06/15/20 2219 06/16/20 0154 06/16/20 0336 06/16/20 [id number] 06/16/20  0825  NA 135 136 136 136   < > 138 138 140 141 138  K 4.5 4.7 4.5 4.9   < > 4.7 5.4* 5.1 4.1 4.3  CL 102 103 104 104  --   --   --   --   --  110  CO2  --  22  --   --   --   --   --   --   --  16*  GLUCOSE 141* 169* 132* 159*  --   --   --   --   --  220*  BUN 13 14 12 13   --   --   --   --   --  17  CREATININE 0.50 0.81 0.50 0.50  --   --   --   --   --  0.90  CALCIUM  --  7.7*  --   --   --   --   --   --   --  7.1*  MG  --   --   --   --   --   --   --   --   --  1.7  PHOS  --   --   --   --   --   --   --   --   --  5.0*   < > = values in this interval not displayed.   GFR: Estimated Creatinine Clearance:  65.2 mL/min (by C-G formula based on SCr of 0.9 mg/dL). Recent Labs  Lab 06/15/20 0054 06/16/20 0825  WBC 10.4 13.7*   Liver Function Tests: No results for input(s): AST, ALT, ALKPHOS, BILITOT, PROT, ALBUMIN in the last 168 hours. No results for input(s): LIPASE, AMYLASE in the last 168 hours. No results for input(s): AMMONIA in the last 168 hours. ABG    Component Value Date/Time   PHART 7.398 06/16/2020 0608   PCO2ART 24.2 (L) 06/16/2020 0608   PO2ART 128 (H) 06/16/2020 0608   HCO3 15.0 (L) 06/16/2020 0608   TCO2 16 (L) 06/16/2020 0608   ACIDBASEDEF 9.0 (H) 06/16/2020 0608   O2SAT 99.0 06/16/2020 0608    Coagulation Profile: No results for input(s): INR, PROTIME in the last 168 hours. Cardiac Enzymes: No results for input(s): CKTOTAL, CKMB, CKMBINDEX, TROPONINI in the last 168 hours. HbA1C: Hgb A1c MFr Bld  Date/Time Value Ref Range Status  05/11/2020 04:26 AM 7.2 (H) 4.8 - 5.6 % Final    Comment:    (NOTE) Pre diabetes:          5.7%-6.4%  Diabetes:              >6.4%  Glycemic control for   <7.0% adults with diabetes   04/25/2020 05:44 AM 7.3 (H) 4.8 - 5.6 % Final    Comment:    (NOTE) Pre diabetes:          5.7%-6.4%  Diabetes:              >6.4%  Glycemic control for   <7.0% adults with diabetes    CBG: Recent Labs  Lab 06/15/20 1148 06/15/20 2032 06/16/20 0402 06/16/20 0730 06/16/20 1125  GLUCAP 117* 218* 207* 204* 210*   CRITICAL CARE Performed by: 06/18/20   Total critical care time: 40 minutes  Critical care time was exclusive of separately billable procedures and treating other patients.  Critical care was necessary to treat or prevent imminent or life-threatening deterioration.  Critical care was  time spent personally by me on the following activities: development of treatment plan with patient and/or surrogate as well as nursing, discussions with consultants, evaluation of patient's response to treatment, examination of patient,  obtaining history from patient or surrogate, ordering and performing treatments and interventions, ordering and review of laboratory studies, ordering and review of radiographic studies, pulse oximetry, re-evaluation of patient's condition and participation in multidisciplinary rounds.  Lynnell Catalan, MD [facility] ICU Physician [facility]  Pager: [phone] Mobile: [phone] After hours: [phone].

## 2020-06-16 NOTE — Progress Notes (Signed)
PHARMACIST LIPID MONITORING   Holly Stevenson is a 69 y.o. female admitted on 06/14/2020 with PVD.  Pharmacy has been consulted to optimize lipid-lowering therapy with the indication of secondary prevention for clinical ASCVD.  Recent Labs:  Lipid Panel (last 6 months):   Lab Results  Component Value Date   CHOL 82 06/02/2020   TRIG 151 (H) 06/02/2020   HDL 20 (L) 06/02/2020   CHOLHDL 4.1 06/02/2020   VLDL 30 06/02/2020   LDLCALC 32 06/02/2020    Hepatic function panel (last 6 months):   Lab Results  Component Value Date   AST 34 05/03/2020   ALT 45 (H) 05/03/2020   ALKPHOS 70 05/03/2020   BILITOT 0.9 05/03/2020    SCr (since admission):   Serum creatinine: 0.5 mg/dL 44/62/86 3817 Estimated creatinine clearance: 73.3 mL/min  Current lipid-lowering therapy: atorvastatin 40mg /day Previous lipid-lowering therapies (if applicable): none Documented or reported allergies or intolerances to lipid-lowering therapies (if applicable): none  Assessment:  No changes needed (on high intensity statin with LDL at goal)  Recommendation per protocol:  Continue current lipid-lowering therapy.  Follow-up with:  Primary care provider - , MD  Follow-up labs after discharge:    Lipid panel annually  Plan: No changes needed  Maurice Small, PharmD Clinical Pharmacist **Pharmacist phone directory can now be found on [email or web address] ([email or web address]).  Listed under [facility].

## 2020-06-16 NOTE — Progress Notes (Signed)
RT contacted by RN that patient had self extubated. Upon RT arrival, patient is talking, on room air, with sats at 99%.  Dr. Denese Killings notified. Patient has no stridor, was placed on 2L [place] with humidity. RT will monitor as needed.

## 2020-06-16 NOTE — Plan of Care (Signed)
Patient self extubated, talking in normal tone. Sats 99% on room air. RT at bedside. CCM notified. Will continue to monitor.

## 2020-06-16 NOTE — Progress Notes (Signed)
eLink Physician-Brief Progress Note Patient Name: Holly Stevenson DOB: Nov 11, 1950 MRN: [id number]   Date of Service  06/16/2020  HPI/Events of Note  Patient admitted for wound infection and dehiscence of a recent Fem-Fem and Fem-POP graft for left lower extremity atheromatous vascular disease. Patient also has uncontrolled post -operative hypertension, and acute blood loss anemia.  eICU Interventions  New Patient Evaluation completed, Hydralazine PRN SBP > 180 mmHg, transfusion PRBC's for acute blood loss anemia.        Holly Stevenson 06/16/2020, 4:38 AM

## 2020-06-16 NOTE — Progress Notes (Signed)
[facility]  Subjective  - sedated on vent   Objective (!) 150/38 (!) 59 98.4 F (36.9 C) (!) 21 100%  Intake/Output Summary (Last 24 hours) at 06/16/2020 0916 Last data filed at 06/16/2020 0901 Gross per 24 hour  Intake 95638.15 ml  Output 4805 ml  Net 6653.15 ml   Left foot cool slightly dusky monophasic DP PT doppler Right foot warm monophasic DP biphasic PT  Incisions all without hematoma VAC with bloody drainage  Assessment/Planning: S/p bilateral common iliac stents left fem ak pop with cryo vein, vein patch left CFA all for infection procedure complicated by profound left leg ischemia now improved but still marginal preop ABI prior to initial revasc was 0.2 left and 0.5 right.  Continue low dose heparin  Continue IV antibiotics  CCM addressing hypertension  VDRF potentially wean and extubate later today  Acute blood loss anemia improved post transfusion  Holly Stevenson 06/16/2020 9:16 AM --  Laboratory Lab Results: Recent Labs    06/15/20 0054 06/15/20 1044 06/16/20 0608 06/16/20 0825  WBC 10.4  --   --  13.7*  HGB 6.4*   < > 7.1* 10.2*  HCT 21.7*   < > 21.0* 29.9*  PLT 246  --   --  PENDING   < > = values in this interval not displayed.   BMET Recent Labs    06/15/20 0054 06/15/20 1445 06/15/20 1613 06/15/20 1717 06/16/20 0336 06/16/20 0608  NA 136 136 136   < > 140 141  K 4.7 4.5 4.9   < > 5.1 4.1  CL 103 104 104  --   --   --   CO2 22  --   --   --   --   --   GLUCOSE 169* 132* 159*  --   --   --   BUN 14 12 13   --   --   --   CREATININE 0.81 0.50 0.50  --   --   --   CALCIUM 7.7*  --   --   --   --   --    < > = values in this interval not displayed.    COAG Lab Results  Component Value Date   INR 1.0 05/03/2020   INR 1.0 04/25/2020   No results found for: PTT

## 2020-06-16 NOTE — Transfer of Care (Signed)
Immediate Anesthesia Transfer of Care Note  Patient: Holly Stevenson  Procedure(s) Performed: LEFT LOWER EXTREMITY THROMBECTOMY (Left Groin) LOWER EXTREMITY ANGIOGRAM (Left Groin) INSERTION OF BILATERAL COMMON ILIAC STENTS, INSERTION OF LEFT EXTERNAL ILIAC STENT (Bilateral Groin) APPLICATION OF WOUND VAC BILATERAL GROINS (Bilateral Groin) BYPASS GRAFT FEMORAL TO ABOVE KNEE POPLITEAL ARTERY USING CYRO SAPHENOUS VEIN (Left Leg Upper) AORTOGRAM (N/A Groin) VEIN PATCH ANGIOPLASTY LEFT COMMON, EXTERNAL ILIAC, and COMMON FEMORAL ARTERY (N/A Groin)  Patient Location: ICU  Anesthesia Type:General  Level of Consciousness: Patient remains intubated per anesthesia plan  Airway & Oxygen Therapy: Patient remains intubated per anesthesia plan and Patient placed on Ventilator (see vital sign flow sheet for setting)  Post-op Assessment: Report given to RN and Post -op Vital signs reviewed and stable  Post vital signs: Reviewed and stable  Last Vitals:  Vitals Value Taken Time  BP    Temp 36.6 C 06/16/20 0400  Pulse 62 06/16/20 0420  Resp 0 06/16/20 0420  SpO2 100 % 06/16/20 0420  Vitals shown include unvalidated device data.  Last Pain:  Vitals:   06/16/20 0400  TempSrc: Axillary  PainSc:       Patients Stated Pain Goal: 3 (06/14/20 6811)  Complications: No complications documented.

## 2020-06-16 NOTE — Anesthesia Postprocedure Evaluation (Signed)
Anesthesia Post Note  Patient: Holly Stevenson  Procedure(s) Performed: LEFT LOWER EXTREMITY THROMBECTOMY (Left Groin) LOWER EXTREMITY ANGIOGRAM (Left Groin) INSERTION OF BILATERAL COMMON ILIAC STENTS, INSERTION OF LEFT EXTERNAL ILIAC STENT (Bilateral Groin) APPLICATION OF WOUND VAC BILATERAL GROINS (Bilateral Groin) BYPASS GRAFT FEMORAL TO ABOVE KNEE POPLITEAL ARTERY USING CYRO SAPHENOUS VEIN (Left Leg Upper) AORTOGRAM (N/A Groin) VEIN PATCH ANGIOPLASTY LEFT COMMON, EXTERNAL ILIAC, and COMMON FEMORAL ARTERY (N/A Groin)     Patient location during evaluation: SICU Anesthesia Type: General Level of consciousness: sedated Pain management: pain level controlled Vital Signs Assessment: post-procedure vital signs reviewed and stable Respiratory status: patient remains intubated per anesthesia plan Cardiovascular status: stable Postop Assessment: no apparent nausea or vomiting Anesthetic complications: no   No complications documented.  Last Vitals:  Vitals:   06/16/20 0530 06/16/20 0545  BP: (!) 187/43 (!) 208/41  Pulse: 65 66  Resp: 18 (!) 22  Temp: 37.1 C 36.9 C  SpO2: 100% 100%    Last Pain:  Vitals:   06/16/20 0545  TempSrc: Axillary  PainSc:                  Holly Stevenson

## 2020-06-16 NOTE — Progress Notes (Signed)
Called to see pt for absent doppler signals in left foot and and right abdominal wall hematoma.  Pt has some numbness in left foot and left leg pain.  Left leg pain seems to be incisional  Left foot cool dusky no doppler signals.  She does have a brisk left femoral doppler and there is a definite temp change below the knee.  The left thigh is cool but appears perfused.  Right foot has brisk DP and PT  She has a firm area on the right lower abdomen above her incision c/w hematoma about 5 x 7 cm  I discussed with pt that the left leg fem pop bypass has occluded and that without thrombectomy she would lose her leg. She does not think she can take anymore operations at this time and I agree that even with thrombectomy chance of limb salvage is low.  Will stop heparin and use SCDs in light of right groin hematoma and the fact that the left leg bypass will now be abandoned.  Most likely will need left AKA in the next few days.  Pain meds ordered  AM labs  ambien for sleep    Fabienne Bruns, MD [facility] [place] Office: [phone]

## 2020-06-16 NOTE — Op Note (Signed)
Patient name: Holly Stevenson MRN: [id number] DOB: 07-29-1950 Sex: female  06/16/2020 Pre-operative Diagnosis: Acute left lower extremity ischemia Post-operative diagnosis:  Same Surgeon:  Apolinar Junes C. Randie Heinz, MD Assistant: Wendi Maya, PA Procedure Performed: 1.  Left common femoral and external iliac endarterectomy with vein patch angioplasty 2.  Harvest left greater saphenous vein 3.  Ultrasound-guided cannulation right profunda femoris artery with primary closure 4.  Aortogram with bilateral iliofemoral runoff 5.  Bilateral common iliac artery stenting with 8 x 29 mm VBX 6.  Stent of left external iliac artery with 7 x 120 mm Elluvia 7.  Left popliteal artery endarterectomy 8.  Left common femoral to above-knee popliteal artery bypass with reversed cryopreserved greater saphenous vein 9.  Left lower extremity thromboembolectomy  Indications: 69 year old female had infected right to left femorofemoral bypass and left femoropopliteal bypass graft.  These were excised vein patch angioplasties were placed left common and external iliac vein stents were placed as well as left SFA stents.  In the PACU she developed acute limb ischemia with mottling of her left leg and dense sensory motor deficit.  She was subsequently indicated for takeback for evaluation of the left lower extremity acute limb ischemia.  An assistant was necessary to facilitate exposure and to expedite the case.  Findings: Common femoral artery was thrombosed.  We opened the previous patch extended the arteriotomy high up onto the external leg artery.  I did excise one of the stents to facilitate clamping.  There was trickle inflow from the external iliac artery.  I performed embolectomy of the SFA stents and establish backbleeding.  I also performed embolectomy of the common external leg arteries I could not pass the Fogarty completely centrally but I did have trickle inflow.  I harvested the left greater saphenous vein  performed patch angioplasty of the external leg artery down to the common femoral artery.  I then cannulated the left common femoral artery with plans for retrograde angiogram.  Unfortunately I could not cross retrograde into the aorta due to the dissection from earlier.  I was able to cross up and over confirm intraluminal access.  I cannulated the profunda femoris artery retrograde I was able to get access into the aorta from there and snare the wire up and over.  I then placed bilateral common iliac artery stents.  Unfortunately from the left common iliac artery stent I was still not able to get access into the aorta as the wire would only go into the dissection plane and up and over into the other stent.  I did have strong inflow.  Unfortunately at completion I had no signals distally.  I reopened the left common femoral artery.  There was no backbleeding.  I elected for bypass at this time.  I performed an end-to-side left common femoral artery and end-to-end left popliteal artery anastomosis.  I did have to perform endarterectomy of the popliteal artery for clamping and bypass.  At completion there were monophasic anterior tibial and posterior tibial signals.  As patient was given significant transfusion and due to the timing of the night she remained intubated with plans for transfer to the ICU.   Procedure:  The patient was identified in the holding area and taken to the operating room where she was placed supine on the operating table general anesthesia was induced.  She was sterilely prepped and draped in the bilateral groins left lower extremity entirely in the usual fashion antibiotics were administered timeout was again called.  Wound vacs had been removed prior to prepping.  I explored the left groin incision.  There was no palpable pulsatility in the groin.  I then began to expose higher up onto the external leg artery and placed a vessel loop with high up onto the inguinal ligament.  I did divide  the crossing vein.  As there was a stent there I did not want to clamp.  Patient was fully heparinized I open to the previous patch longitudinally extended up onto the external iliac artery.  One stent was identified we remove this to facilitate clamping.  I performed extensive endarterectomy.  The common femoral artery did appear to be the issue.  A 4 Fogarty was passed distally 3 times we did return trickle blood flow.  I could not clamp the SFA due to the stent that was extending all the way to the common femoral artery.  Therefore I used a 4 Fogarty with stopcock for hemostasis.  I passed another 4 Fogarty centrally and would not pass all the way was able to establish trickle inflow.  I also done to the 3 Fogarty but this would not go.  I then clamped the external iliac artery where I removed the previous stent under the inguinal ligament.  I then used ultrasound I did identify marginal greater saphenous vein in the left thigh.  An incision was made over this.  I dissected out the saphenous vein for approximately 12 cm clipped it at both ends divided the branches between clips and removed.  I opened it and spatulated.  I then sewed it in place as a patch angioplasty and prior to completion allowed flushing all directions thoroughly irrigated with heparinized saline.  Upon completion I still had minimal inflow.  I then cannulated the patch with micropuncture needle followed by wire and sheath.  I placed a Glidewire advantage followed by 5 [place] sheath.  I used a Berenstein catheter attempt to get through that into the aorta but I kept going into the dissection plane.  With this I use ultrasound I cannulated the profunda femoris artery on the right.  I ultimately placed a micropuncture needle followed the wire and sheath and placed a Glidewire advantage and a 5 [place] sheath.  I was able to use Berenstein catheter to get access into the aorta.  I confirmed this with angiography.  I did try multiple times with  both Glidewire and Glidewire advantage very catheter and Omni catheter to get into the aorta from the left side.  I then attempted again to cross up and over from the right side with Omni catheter and crossover catheter but I was unable to.  I then placed a Rosen wire into the aorta from the right side.  I crossed the bifurcation from the left with a long Glidewire I was able to snare this and pulled through and through access.  I then placed a 7 French sheath over the [place] wire only with a Glidewire pulled through and through.  I exchanged for 7 French sheath on the left side.  I then placed 8 x 29 mm VBX stents in the bilateral common iliac arteries.  I then removed my Glidewire from the right groin attempted to directed into the aorta but unfortunately still could not.  I did have strong pulsatility in the left common femoral artery even though I could not get direct access into the aorta.  With this I elected that I had the best flow that I could have.  I remove the sheath from the left groin suture repaired this with 5-0 Prolene suture.  I attempted to deploy a minx device in the right profunda femoris artery but this would not deploy properly.  I then had to remove the sheath I was able to expose the profunda itself and repair this with 5-0 Prolene suture interrupted figure-of-eight sutures.  I did check with Doppler there was good flow in the profunda after this repair.  Unfortunately there was no flow past the left groin after this repair.  I subsequently reclamped the inflow and the outflow.  I opened the vessel longitudinally.  I still had very strong inflow but very minimal backbleeding.  Has not already thrombectomized the stents I elected for bypass.  Given that the patient had previous infection I elected for cryopreserved vein.  I then reopened the above-the-knee incision dissected down to the popliteal artery.  This was heavily calcified.  There were previous stents all the way down to the previous  bypass position.  I dissected further distally.  I placed a vessel loop around this.  I then tunneled between the 2 incisions.  I opened the popliteal artery.  Was heavily calcified there this may have been one of the issues after having passed the Fogarty.  I performed endarterectomy.  I ultimately transected the popliteal artery and tied off proximally.  I did have backbleeding and I clamped the popliteal artery.  I then prepared cryopreserved vein reversed this and sewed it into side in the groin with 6-0 Prolene suture.  Upon completion of flushed through the vein.  I very strong pulsatility.  I tunneled this anatomically.  I then straighten the leg and trimmed to size and sewed end to end with 6-0 Prolene suture.  Prior to completion we allowed flushing all directions.  Upon completion there was good signal at the anterior tibial and posterior tibial these were monophasic although graft dependent.  We then irrigated all her wounds.  I did not administer protamine we will plan to start heparin 500 units/h in 2 hours.  We closed tissue over the left groin bypass and placed a wound VAC there.  In the right groin the sartorius muscle was intact and previously affixed to the inguinal ligament and a wound VAC was fashioned over this.  The vein harvest site was closed deep with Vicryl and staples were placed above that.  The above-knee popliteal incision was closed deep with 2-0 Vicryl and staples above that.  There were no immediate complications.  Disposition was to the ICU with continued intubation.  EBL: 1500cc  Transfusion: 4u prbc's, 3u ffp   Oley Lahaie C. Randie Heinz, MD [facility] Office: [phone] Pager: [phone]

## 2020-06-17 ENCOUNTER — Inpatient Hospital Stay (HOSPITAL_COMMUNITY): Payer: BC Managed Care – PPO

## 2020-06-17 DIAGNOSIS — R209 Unspecified disturbances of skin sensation: Secondary | ICD-10-CM

## 2020-06-17 DIAGNOSIS — M79609 Pain in unspecified limb: Secondary | ICD-10-CM

## 2020-06-17 LAB — GLUCOSE, CAPILLARY
Glucose-Capillary: 190 mg/dL — ABNORMAL HIGH (ref 70–99)
Glucose-Capillary: 188 mg/dL — ABNORMAL HIGH (ref 70–99)
Glucose-Capillary: 167 mg/dL — ABNORMAL HIGH (ref 70–99)
Glucose-Capillary: 144 mg/dL — ABNORMAL HIGH (ref 70–99)

## 2020-06-17 LAB — CBC
HCT: 24.9 % — ABNORMAL LOW (ref 36.0–46.0)
Hemoglobin: 8.4 g/dL — ABNORMAL LOW (ref 12.0–15.0)
MCH: 29.2 pg (ref 26.0–34.0)
MCHC: 33.7 g/dL (ref 30.0–36.0)
MCV: 86.5 fL (ref 80.0–100.0)
Platelets: 89 10*3/uL — ABNORMAL LOW (ref 150–400)
RBC: 2.88 MIL/uL — ABNORMAL LOW (ref 3.87–5.11)
RDW: 16 % — ABNORMAL HIGH (ref 11.5–15.5)
WBC: 16.1 10*3/uL — ABNORMAL HIGH (ref 4.0–10.5)
nRBC: 0.1 % (ref 0.0–0.2)

## 2020-06-17 LAB — PREPARE FRESH FROZEN PLASMA
Unit division: 0
Unit division: 0
Unit division: 0
Unit division: 0

## 2020-06-17 LAB — BASIC METABOLIC PANEL
Anion gap: 12 (ref 5–15)
BUN: 16 mg/dL (ref 8–23)
CO2: 17 mmol/L — ABNORMAL LOW (ref 22–32)
Calcium: 7.4 mg/dL — ABNORMAL LOW (ref 8.9–10.3)
Chloride: 107 mmol/L (ref 98–111)
Creatinine, Ser: 0.94 mg/dL (ref 0.44–1.00)
GFR, Estimated: >60 mL/min (ref >60)
Glucose, Bld: 189 mg/dL — ABNORMAL HIGH (ref 70–99)
Potassium: 3.6 mmol/L (ref 3.5–5.1)
Sodium: 136 mmol/L (ref 135–145)

## 2020-06-17 LAB — BPAM FFP
Blood Product Expiration Date: 202112312359
Blood Product Expiration Date: 202201042359
Blood Product Expiration Date: 202201042359
Blood Product Expiration Date: 202201122359
ISSUE DATE / TIME: 202112310208
ISSUE DATE / TIME: 202112310208
ISSUE DATE / TIME: 202112310207
ISSUE DATE / TIME: 202112311927
Unit Type and Rh: 6200
Unit Type and Rh: 6200
Unit Type and Rh: 600
Unit Type and Rh: 6200

## 2020-06-17 LAB — MRSA PCR SCREENING: MRSA by PCR: NEGATIVE

## 2020-06-17 LAB — PREPARE RBC (CROSSMATCH)

## 2020-06-17 MED ORDER — MORPHINE SULFATE (PF) 2 MG/ML IV SOLN
2.0000 mg | INTRAVENOUS | Status: DC | PRN
Start: 2020-06-17 — End: 2020-06-17
  Administered 2020-06-17 (×2): 2 mg via INTRAVENOUS
  Filled 2020-06-17 (×2): qty 1

## 2020-06-17 MED ORDER — ACETAMINOPHEN 325 MG RE SUPP
325.0000 mg | RECTAL | Status: DC | PRN
  Filled 2020-06-17: qty 2

## 2020-06-17 MED ORDER — DIPHENHYDRAMINE HCL 12.5 MG/5ML PO ELIX
12.5000 mg | ORAL_SOLUTION | Freq: Four times a day (QID) | ORAL | Status: DC | PRN
  Filled 2020-06-17: qty 5

## 2020-06-17 MED ORDER — ONDANSETRON HCL 4 MG/2ML IJ SOLN: 4.0000 mg | Freq: Four times a day (QID) | INTRAMUSCULAR | Status: DC | PRN

## 2020-06-17 MED ORDER — OLANZAPINE 2.5 MG PO TABS
2.5000 mg | ORAL_TABLET | Freq: Every day | ORAL | Status: DC
  Administered 2020-06-17 – 2020-07-02 (×16): 2.5 mg via ORAL
  Filled 2020-06-17 (×17): qty 1

## 2020-06-17 MED ORDER — DIPHENHYDRAMINE HCL 50 MG/ML IJ SOLN: 12.5000 mg | Freq: Four times a day (QID) | INTRAMUSCULAR | Status: DC | PRN

## 2020-06-17 MED ORDER — IPRATROPIUM-ALBUTEROL 0.5-2.5 (3) MG/3ML IN SOLN
3.0000 mL | Freq: Four times a day (QID) | RESPIRATORY_TRACT | Status: DC | PRN
  Administered 2020-06-27 – 2020-07-12 (×6): 3 mL via RESPIRATORY_TRACT
  Filled 2020-06-17 (×5): qty 3

## 2020-06-17 MED ORDER — ACETAMINOPHEN 325 MG PO TABS
325.0000 mg | ORAL_TABLET | ORAL | Status: DC | PRN
  Administered 2020-06-20: 650 mg via ORAL
  Filled 2020-06-17: qty 2

## 2020-06-17 MED ORDER — ALUM & MAG HYDROXIDE-SIMETH 200-200-20 MG/5ML PO SUSP
15.0000 mL | ORAL | Status: DC | PRN
Start: 2020-06-17 — End: 2020-06-17

## 2020-06-17 MED ORDER — MAGNESIUM SULFATE 2 GM/50ML IV SOLN: 2.0000 g | Freq: Every day | INTRAVENOUS | Status: DC | PRN

## 2020-06-17 MED ORDER — NALOXONE HCL 0.4 MG/ML IJ SOLN: 0.4000 mg | INTRAMUSCULAR | Status: DC | PRN

## 2020-06-17 MED ORDER — SODIUM CHLORIDE 0.9% FLUSH: 9.0000 mL | INTRAVENOUS | Status: DC | PRN

## 2020-06-17 MED ORDER — PANTOPRAZOLE SODIUM 40 MG PO TBEC: 40.0000 mg | DELAYED_RELEASE_TABLET | Freq: Every day | ORAL | Status: DC

## 2020-06-17 MED ORDER — ACETAMINOPHEN 325 MG PO TABS: 650.0000 mg | ORAL_TABLET | Freq: Once | ORAL | Status: AC

## 2020-06-17 MED ORDER — GUAIFENESIN-DM 100-10 MG/5ML PO SYRP: 15.0000 mL | ORAL_SOLUTION | ORAL | Status: DC | PRN

## 2020-06-17 MED ORDER — METOPROLOL TARTRATE 5 MG/5ML IV SOLN: 2.0000 mg | INTRAVENOUS | Status: DC | PRN

## 2020-06-17 MED ORDER — HYDRALAZINE HCL 20 MG/ML IJ SOLN: 5.0000 mg | INTRAMUSCULAR | Status: DC | PRN

## 2020-06-17 MED ORDER — DOCUSATE SODIUM 100 MG PO CAPS: 100.0000 mg | ORAL_CAPSULE | Freq: Every day | ORAL | Status: DC

## 2020-06-17 MED ORDER — LABETALOL HCL 5 MG/ML IV SOLN
10.0000 mg | INTRAVENOUS | Status: AC | PRN
  Administered 2020-06-17 (×4): 10 mg via INTRAVENOUS
  Filled 2020-06-17 (×2): qty 4

## 2020-06-17 MED ORDER — PHENOL 1.4 % MT LIQD: 1.0000 | OROMUCOSAL | Status: DC | PRN

## 2020-06-17 MED ORDER — POTASSIUM CHLORIDE CRYS ER 20 MEQ PO TBCR
20.0000 meq | EXTENDED_RELEASE_TABLET | Freq: Every day | ORAL | Status: AC | PRN
  Administered 2020-06-17: 20 meq via ORAL
  Filled 2020-06-17: qty 1

## 2020-06-17 MED ORDER — MORPHINE SULFATE 1 MG/ML IV SOLN PCA
INTRAVENOUS | Status: DC
  Administered 2020-06-17: 2 mg via INTRAVENOUS
  Administered 2020-06-18: 1 mg via INTRAVENOUS
  Administered 2020-06-18 (×2): 0 mg via INTRAVENOUS
  Administered 2020-06-18: 12 mg via INTRAVENOUS
  Administered 2020-06-18: 0 mg via INTRAVENOUS
  Administered 2020-06-19: 3 mg via INTRAVENOUS
  Administered 2020-06-19: 21 mg via INTRAVENOUS
  Administered 2020-06-19 (×2): 3 mg via INTRAVENOUS
  Administered 2020-06-19: 9 mg via INTRAVENOUS
  Administered 2020-06-19: 1 mg via INTRAVENOUS
  Administered 2020-06-20: 9 mg via INTRAVENOUS
  Administered 2020-06-20: 1 mg via INTRAVENOUS
  Administered 2020-06-20: 3 mg via INTRAVENOUS
  Administered 2020-06-20: 0 mg via INTRAVENOUS
  Administered 2020-06-20: 4 mg via INTRAVENOUS
  Administered 2020-06-20: 0 mg via INTRAVENOUS
  Administered 2020-06-21: 7 mg via INTRAVENOUS
  Administered 2020-06-21: 2 mg via INTRAVENOUS
  Administered 2020-06-21: 7 mg via INTRAVENOUS
  Administered 2020-06-21: 4 mg via INTRAVENOUS
  Administered 2020-06-22: 11 mg via INTRAVENOUS
  Administered 2020-06-22: 4 mg via INTRAVENOUS
  Administered 2020-06-22: 4.39 mg via INTRAVENOUS
  Administered 2020-06-22: 0 mg via INTRAVENOUS
  Administered 2020-06-22: 11 mg via INTRAVENOUS
  Administered 2020-06-23: 1 mg via INTRAVENOUS
  Administered 2020-06-23 (×2): 2 mg via INTRAVENOUS
  Administered 2020-06-24: 5 mg via INTRAVENOUS
  Administered 2020-06-24: 3 mg via INTRAVENOUS
  Administered 2020-06-24 – 2020-06-25 (×2): 0 mg via INTRAVENOUS
  Administered 2020-06-25 – 2020-06-26 (×3): 2 mg via INTRAVENOUS
  Administered 2020-06-26: 20 mg via INTRAVENOUS
  Administered 2020-06-26: 0 mg via INTRAVENOUS
  Administered 2020-06-26: 16 mg via INTRAVENOUS
  Administered 2020-06-26: 5 mg via INTRAVENOUS
  Administered 2020-06-27: 1 mg via INTRAVENOUS
  Administered 2020-06-27: 0 mg via INTRAVENOUS
  Filled 2020-06-17 (×9): qty 30

## 2020-06-17 NOTE — Progress Notes (Signed)
Pt arrived to 4e from 2h. Pt oriented to room and staff. CHG bath completed. Vitals obtained and stable. Wound vac present on bilateral groins.

## 2020-06-17 NOTE — Progress Notes (Signed)
Just received PCA pump from supply room. Pt sleeping comfortably and stated she wants her pain medicine later tonight. Pt has all supplies needed for PCA administration. Shift change occurring in an hour. Will pass along to next shift for set-up tonight.   Ardeen Jourdain BSN, RN

## 2020-06-17 NOTE — Progress Notes (Signed)
Mobility Specialist: Progress Note   06/17/20 1559  Mobility  Activity Transferred:  Bed to chair  Level of Assistance +2 (takes two people)  Mobility Response Tolerated well  Mobility performed by Mobility specialist;Other (comment) (Assisted by OT)  Bed Position Chair  $Mobility charge 1 Mobility   Pre-Mobility: 77 HR, 153/58 BP, 97% SpO2 Post-Mobility: 73 HR, 169/53 BP, 100% SpO2  Pt attempted to stand and pivot to chair with assistance from myself and OT utilizing gait belt and L knee block. Pt able to get to standing position but unable to make transfer. Pt back to EOB to utilize scooting to transfer to chair, +2 assist for safety. Pt in chair with call bell and phone in reach. Will f/u to assist in transferring back to bed.   [facility] Trixy Loyola Mobility Specialist

## 2020-06-17 NOTE — Evaluation (Signed)
Physical Therapy Evaluation Patient Details Name: Holly Stevenson MRN: [id number] DOB: May 06, 1951 Today's Date: 06/17/2020   History of Present Illness  70 yo admitted 12/29 with right groin dehiscence s/p debridement and graft of fem fem BPG. Additional surgeries 12/30 and 12/31 for endarterectomy and thrombolectomy with resultant LLE BPG occlusion and Rt abdominal hematoma. Pt awaiting LLE AKA. Pt with recent admission 12/16-12/22 for occulsion s/p thrombectomy of Rt to left fem- fem BPG and thrombectomy of left fem pop BPG, bronchoscopy 12/21 for right lung mass. PMHx: HTN, DM, CKD, gout, PAD, COPD, HLD, polyneuropathy  Clinical Impression  Pt with daughter present on arrival and very willing to attempt mobility. Pt able to peroform bed level HEP and transition to sitting. Pt requested to attempt standing once EOB and made great effort but unable to stand on RLE even with LLE blocked. Pt with decreased strength, function, and mobility who will benefit from acute therapy to maximize mobility, safety and independence.   SpO2 94% on RA    Follow Up Recommendations CIR;Supervision/Assistance - 24 hour    Equipment Recommendations  Wheelchair (measurements PT);Wheelchair cushion (measurements PT);3in1 (PT)    Recommendations for Other Services       Precautions / Restrictions Precautions Precautions: Fall Precaution Comments: bil groin VAc, LLE cold/lack of circulation      Mobility  Bed Mobility Overal bed mobility: Needs Assistance Bed Mobility: Supine to Sit;Sit to Supine     Supine to sit: Mod assist Sit to supine: Mod assist   General bed mobility comments: cues for sequence with assist to bring legs to EOB and elevate trunk. REturn to supine with assist to lift legs. Pt able to bridge and roll in supine with only cues. Pt sliding toward HOB with bil rails with minguard    Transfers Overall transfer level: Needs assistance   Transfers: Sit to/from Stand Sit to  Stand: Max assist         General transfer comment: attempted sit to stand with LLE blocked, RW and belt per pt request. She was able to push through RLE to initiate rise and clear sacrum but could not achieve full standing  Ambulation/Gait             General Gait Details: unable  Stairs            Wheelchair Mobility    Modified Rankin (Stroke Patients Only)       Balance   Sitting-balance support: No upper extremity supported;Feet supported Sitting balance-Leahy Scale: Fair       Standing balance-Leahy Scale: Zero                               Pertinent Vitals/Pain Pain Score: 6  Pain Location: LLE Pain Descriptors / Indicators: Sore;Operative site guarding Pain Intervention(s): Limited activity within patient's tolerance;Monitored during session;Repositioned    Home Living Family/patient expects to be discharged to:: Private residence Living Arrangements: Alone Available Help at Discharge: Family Type of Home: House Home Access: Stairs to enter Entrance Stairs-Rails: Can reach both Entrance Stairs-Number of Steps: flight Home Layout: Two level Home Equipment: Walker - standard;Shower seat;Walker - 2 wheels      Prior Function Level of Independence: Needs assistance   Gait / Transfers Assistance Needed: walking with RW since last admission, assist for stairs  ADL's / Homemaking Assistance Needed: patient able to complete sit/stand sink baths independently.  Daughter assists with showers up to 3x/wk.  Hand Dominance        Extremity/Trunk Assessment   Upper Extremity Assessment Upper Extremity Assessment: Overall WFL for tasks assessed    Lower Extremity Assessment Lower Extremity Assessment: Generalized weakness RLE Deficits / Details: grossly 3/5 LLE Deficits / Details: hip ROM 3/5 limited function of knee and no movement of ankle or toes with noted cold to touch and lack of circulation    Cervical / Trunk  Assessment Cervical / Trunk Assessment: Normal  Communication   Communication: No difficulties  Cognition Arousal/Alertness: Awake/alert Behavior During Therapy: WFL for tasks assessed/performed Overall Cognitive Status: Impaired/Different from baseline Area of Impairment: Memory                     Memory: Decreased short-term memory                General Comments      Exercises General Exercises - Lower Extremity Heel Slides: AROM;Right;Supine;10 reps Hip ABduction/ADduction: AROM;Both;Supine;10 reps Straight Leg Raises: AROM;AAROM;Both;10 reps;Supine (AAROM on LLE)   Assessment/Plan    PT Assessment Patient needs continued PT services  PT Problem List Decreased strength;Decreased range of motion;Decreased activity tolerance;Decreased balance;Decreased mobility;Cardiopulmonary status limiting activity;Decreased skin integrity;Pain;Decreased knowledge of use of DME       PT Treatment Interventions DME instruction;Gait training;Stair training;Functional mobility training;Therapeutic activities;Therapeutic exercise;Balance training;Neuromuscular re-education    PT Goals (Current goals can be found in the Care Plan section)  Acute Rehab PT Goals Patient Stated Goal: be able to get up and out of the bed PT Goal Formulation: With patient/family Time For Goal Achievement: 07/01/20 Potential to Achieve Goals: Fair    Frequency Min 3X/week   Barriers to discharge Decreased caregiver support      Co-evaluation               AM-PAC PT "6 Clicks" Mobility  Outcome Measure Help needed turning from your back to your side while in a flat bed without using bedrails?: A Little Help needed moving from lying on your back to sitting on the side of a flat bed without using bedrails?: A Lot Help needed moving to and from a bed to a chair (including a wheelchair)?: Total Help needed standing up from a chair using your arms (e.g., wheelchair or bedside chair)?: A  Lot Help needed to walk in hospital room?: Total Help needed climbing 3-5 steps with a railing? : Total 6 Click Score: 10    End of Session   Activity Tolerance: Patient tolerated treatment well Patient left: in bed;with call bell/phone within reach;with family/visitor present Nurse Communication: Mobility status PT Visit Diagnosis: Unsteadiness on feet (R26.81);Other abnormalities of gait and mobility (R26.89);Muscle weakness (generalized) (M62.81);Difficulty in walking, not elsewhere classified (R26.2);Pain    Time: 2707-8675 PT Time Calculation (min) (ACUTE ONLY): 22 min   Charges:   PT Evaluation $PT Eval Moderate Complexity: 1 Mod          Lochlin Eppinger P, PT Acute Rehabilitation Services Pager: [phone] Office: [phone]   Enedina Finner Maeleigh Buschman 06/17/2020, 12:15 PM

## 2020-06-17 NOTE — Progress Notes (Signed)
Morphine PCA syringe returned to pharmacy until needed tonight.

## 2020-06-17 NOTE — Progress Notes (Signed)
Aortoiliac duplex has been completed. Preliminary results can be found in CV Proc through chart review.   06/17/20 12:09 PM Olen Cordial RVT

## 2020-06-17 NOTE — Progress Notes (Signed)
Dear Doctor This patient has been identified as a candidate for PICC for the following reason (s): IV therapy over 48 hours, drug pH or osmolality (causing phlebitis, infiltration in 24 hours) and restarts due to phlebitis and infiltration in 24 hours If you agree, please write an order for the indicated device. For any questions contact the Vascular Access Team at [phone] if no answer, please leave a message.  Thank you for supporting the early vascular access assessment program. 

## 2020-06-17 NOTE — Progress Notes (Signed)
Mobility Specialist: Progress Note   06/17/20 1653  Mobility  Activity Transferred:  Chair to bed  Level of Assistance +2 (takes two people)  Assistive Device  (Hand held assist)  Mobility Response Tolerated well  Mobility performed by Mobility specialist  $Mobility charge 1 Mobility   Pt transferred back to bed with assistance from RN and myself.   [facility] Cybill Uriegas Mobility Specialist

## 2020-06-17 NOTE — H&P (View-Only) (Signed)
[facility] [place] [facility]  Subjective  - left leg hurts   Objective (!) 134/33 76 98.2 F (36.8 C) (Oral) (!) 27 98%  Intake/Output Summary (Last 24 hours) at 06/17/2020 0939 Last data filed at 06/17/2020 0700 Gross per 24 hour  Intake 1907.5 ml  Output 2965 ml  Net -1057.5 ml   Brisk doppler flow right foot pink warm Left leg cool mottled below knee warm left thigh to just above knee] Hematoma right groin area unchanged  Assessment/Planning: S/p removal infected graft  Leukocytosis probably reactive and left leg related to ischemia.  Continue Vanc Zosyn  Acute blood loss anemia will transfuse 2 U RBC continue to monitor right groin hematoma  Left leg ischemia, left fem pop is occluded no attempts at limb salvage at this point.  Will need AKA over the next several days will get left iliac profunda duplex to make sure we have adequate flow to heal AKA  Will need VAC change on Monday  Protein calorie malnutrition encourage PO intake daughter can bring food if she wishes  D/c Foley  Needs to get out of bed  Transfer to 4E  Daughter updated at bedside  Fabienne Bruns 06/17/2020 9:39 AM --  Laboratory Lab Results: Recent Labs    06/16/20 0825 06/17/20 0433  WBC 13.7* 16.1*  HGB 10.2* 8.4*  HCT 29.9* 24.9*  PLT 98* 89*   BMET Recent Labs    06/16/20 0825 06/17/20 0433  NA 138 136  K 4.3 3.6  CL 110 107  CO2 16* 17*  GLUCOSE 220* 189*  BUN 17 16  CREATININE 0.90 0.94  CALCIUM 7.1* 7.4*    COAG Lab Results  Component Value Date   INR 1.0 05/03/2020   INR 1.0 04/25/2020   No results found for: PTT

## 2020-06-17 NOTE — Progress Notes (Signed)
PT duplex shows reocclusion of left external iliac and common femoral.  Case d/w Dr Randie Heinz.  I do not believe she is a candidate for restent with the difficulties from the last operation.  The leg is profoundly ischemic so and ax pop bypass is not feasible.  Only other option would be extraanatomic ax profunda to heal aka.  This would be very high risk in light of recent groin sepsis.  I d/w pt and and her daughter all of the above.  Will proceed with high AKA tomorrow in hopes of avoiding further procedures but may have difficulty healing this and require ax pop.  NPo p midnight Consent  Fabienne Bruns, MD [facility] [place] Office: [phone]

## 2020-06-17 NOTE — Progress Notes (Signed)
Pharmacy Antibiotic Note  Holly Stevenson is a 70 y.o. female admitted on 06/14/2020 with right groin wound infection.  Presented 06/14/20 for surgery.  S/p  I& D R groin wound , graft of femorofemoral bypass 12/29. Pharmacy has been consulted for Vancomycin and Zosyn dosing.  Preop 2g cefazolin given 12/29 @ 1338 Afebrile,  Scr 0.94 today.  Plan: Vancomycin 1500 mg q 24 hrs Zosyn 3.375 g iv q8h EI Consider narrowing to ampicillin? Monitor clinical progress, renal function. Check steady state vancomycin trough per protocol if needed.    Height: 5\' 8"  (172.7 cm) Weight: 87.9 kg (193 lb 12.6 oz) IBW/kg (Calculated) : 63.9  Temp (24hrs), Avg:98.3 F (36.8 C), Min:97.9 F (36.6 C), Max:99.1 F (37.3 C)  Recent Labs  Lab 06/15/20 0054 06/15/20 1445 06/15/20 1613 06/16/20 0825 06/17/20 0433  WBC 10.4  --   --  13.7* 16.1*  CREATININE 0.81 0.50 0.50 0.90 0.94    Estimated Creatinine Clearance: 64.6 mL/min (by C-G formula based on SCr of 0.94 mg/dL).    No Known Allergies  Antimicrobials this admission: Vanc 12/29>> Zosyn 12/29>>  Dose adjustments this admission: n/a  Microbiology results: 12/29 COVID:  neg 12/29 anaerobic culture> pend  12/29 wound culture> enterococcus faec- panS  1/30, Reece Leader, BCPS, [facility] Clinical Pharmacist  06/17/2020 10:12 AM   [facility] pharmacy phone numbers are listed on [email or web address]

## 2020-06-17 NOTE — Evaluation (Signed)
Occupational Therapy Evaluation Patient Details Name: Holly Stevenson MRN: [id number] DOB: 07-27-1950 Today's Date: 06/17/2020    History of Present Illness 70 yo admitted 12/29 with right groin dehiscence s/p debridement and graft of fem fem BPG. Additional surgeries 12/30 and 12/31 for endarterectomy and thrombolectomy with resultant LLE BPG occlusion and Rt abdominal hematoma. Pt awaiting LLE AKA. Pt with recent admission 12/16-12/22 for occulsion s/p thrombectomy of Rt to left fem- fem BPG and thrombectomy of left fem pop BPG, bronchoscopy 12/21 for right lung mass. PMHx: HTN, DM, CKD, gout, PAD, COPD, HLD, polyneuropathy   Clinical Impression   Patient admitted for the above diagnosis.  Unfortunately it appears she is being scheduled for a L AKA early next week.  OT will need to reassess her at that time.  Based on the initial evaluation, CIR should be a good post acute option for her.  She is very motivated and and understands she will start at wheelchair level.  OT will continue to follow her in the acute setting.      Follow Up Recommendations  CIR    Equipment Recommendations  3 in 1 bedside commode;Tub/shower bench;Wheelchair (measurements OT);Wheelchair cushion (measurements OT)    Recommendations for Other Services       Precautions / Restrictions Precautions Precautions: Fall Precaution Comments: bil groin VAc, LLE cold/lack of circulation Restrictions Weight Bearing Restrictions: No      Mobility Bed Mobility Overal bed mobility: Needs Assistance Bed Mobility: Supine to Sit;Sit to Supine     Supine to sit: Mod assist Sit to supine: Mod assist   General bed mobility comments: cues for sequence with assist to bring legs to EOB and elevate trunk. REturn to supine with assist to lift legs. Pt able to bridge and roll in supine with only cues. Pt sliding toward HOB with bil rails with minguard    Transfers Overall transfer level: Needs assistance   Transfers:  Lateral/Scoot Transfers Sit to Stand: Max assist        Lateral/Scoot Transfers: Mod assist General transfer comment: attempted sit to stand with LLE blocked, RW and belt per pt request. She was able to push through RLE to initiate rise and clear sacrum but could not achieve full standing    Balance   Sitting-balance support: No upper extremity supported;Feet supported Sitting balance-Leahy Scale: Fair     Standing balance support: Bilateral upper extremity supported Standing balance-Leahy Scale: Poor                             ADL either performed or assessed with clinical judgement   ADL   Eating/Feeding: Independent;Sitting               Upper Body Dressing : Minimal assistance;Sitting   Lower Body Dressing: Maximal assistance;Sitting/lateral leans               Functional mobility during ADLs: Moderate assistance;+2 for safety/equipment General ADL Comments: scoot to recliner     Vision Baseline Vision/History: No visual deficits Patient Visual Report: No change from baseline       Perception     Praxis      Pertinent Vitals/Pain Pain Assessment: Faces Pain Score: 6  Faces Pain Scale: Hurts even more Pain Location: LLE Pain Descriptors / Indicators: Sore;Operative site guarding Pain Intervention(s): Monitored during session     Hand Dominance Right   Extremity/Trunk Assessment Upper Extremity Assessment Upper Extremity Assessment: Generalized weakness  Lower Extremity Assessment Lower Extremity Assessment: Generalized weakness RLE Deficits / Details: grossly 3/5 LLE Deficits / Details: hip ROM 3/5 limited function of knee and no movement of ankle or toes with noted cold to touch and lack of circulation   Cervical / Trunk Assessment Cervical / Trunk Assessment: Normal   Communication Communication Communication: No difficulties   Cognition Arousal/Alertness: Awake/alert Behavior During Therapy: WFL for tasks  assessed/performed Overall Cognitive Status: Within Functional Limits for tasks assessed Area of Impairment: Memory                     Memory: Decreased short-term memory             General Comments       Exercises    Shoulder Instructions      Home Living Family/patient expects to be discharged to:: Private residence Living Arrangements: Alone Available Help at Discharge: Family Type of Home: House Home Access: Stairs to enter Technical brewer of Steps: flight Entrance Stairs-Rails: Can reach both Home Layout: Two level Alternate Level Stairs-Number of Steps: flight Alternate Level Stairs-Rails: Can reach both Bathroom Shower/Tub: Tub/shower unit;Tub only;Walk-in shower   Bathroom Toilet: Standard Bathroom Accessibility: No   Home Equipment: Walker - standard;Shower seat;Walker - 2 wheels   Additional Comments: Patient stating she will probably have to sell her house and move in with her daughter after rehab, and once she buys a new house.  Daughter's home has 3 STE and one level.  Tub/shower.      Prior Functioning/Environment Level of Independence: Needs assistance  Gait / Transfers Assistance Needed: walking with RW since last admission, assist for stairs ADL's / Homemaking Assistance Needed: patient able to complete sit/stand sink baths independently.  Daughter assists with showers up to 3x/wk. Communication / Swallowing Assistance Needed: WFL Comments: works FT as an Multimedia programmer, drives, care for her own meds, meals and home mangement.        OT Problem List: Decreased activity tolerance;Pain;Decreased strength;Decreased range of motion;Impaired balance (sitting and/or standing)      OT Treatment/Interventions: Self-care/ADL training;DME and/or AE instruction;Patient/family education;Balance training;Therapeutic activities    OT Goals(Current goals can be found in the care plan section) Acute Rehab OT Goals Patient Stated Goal:  I have to learn everything again OT Goal Formulation: With patient Time For Goal Achievement: 07/01/20 Potential to Achieve Goals: Good ADL Goals Pt Will Perform Grooming: with set-up;sitting Pt Will Perform Upper Body Bathing: with set-up;sitting Pt Will Perform Lower Body Bathing: with set-up;sitting/lateral leans Pt Will Perform Upper Body Dressing: with set-up;sitting Pt Will Perform Lower Body Dressing: with set-up;sitting/lateral leans Pt Will Transfer to Toilet: with min guard assist;squat pivot transfer;bedside commode Pt Will Perform Toileting - Clothing Manipulation and hygiene: with min guard assist;sitting/lateral leans  OT Frequency: Min 2X/week   Barriers to D/C:    none noted       Co-evaluation              AM-PAC OT "6 Clicks" Daily Activity     Outcome Measure Help from another person eating meals?: None Help from another person taking care of personal grooming?: A Little Help from another person toileting, which includes using toliet, bedpan, or urinal?: A Lot Help from another person bathing (including washing, rinsing, drying)?: A Lot Help from another person to put on and taking off regular upper body clothing?: A Little Help from another person to put on and taking off regular lower body clothing?: A Lot 6 Click  Score: 16   End of Session Nurse Communication: Mobility status  Activity Tolerance: Patient limited by fatigue Patient left: in chair;with call bell/phone within reach;with chair alarm set  OT Visit Diagnosis: Unsteadiness on feet (R26.81);Muscle weakness (generalized) (M62.81);Pain Pain - Right/Left: Left Pain - part of body: Leg                Time: 1540-1554 OT Time Calculation (min): 14 min Charges:  OT General Charges $OT Visit: 1 Visit OT Evaluation $OT Eval Moderate Complexity: 1 Mod  06/17/2020  Rich, OTR/L  Acute Rehabilitation Services  Office:  [phone]   Suzanna Obey 06/17/2020, 4:07 PM

## 2020-06-17 NOTE — Progress Notes (Signed)
eLink Physician-Brief Progress Note Patient Name: Holly Stevenson DOB: 10/13/1950 MRN: [id number]   Date of Service  06/17/2020  HPI/Events of Note  Low level agitated delirium from a  combination of insomnia, Ambien, PRN Morphine and musculoskeletal aches.  eICU Interventions  Bedside RN counseled not to give anymore Morphine, Zyprexa 2.5 mg po x 1, Tylenol 650 mg po x 1, bedside RN advised to minimize stimulation to allow her space to drift off to sleep.        Holly Stevenson 06/17/2020, 3:44 AM

## 2020-06-17 NOTE — Progress Notes (Addendum)
[facility] [place]  Subjective  - left leg hurts   Objective (!) 134/33 76 98.2 F (36.8 C) (Oral) (!) 27 98%  Intake/Output Summary (Last 24 hours) at 06/17/2020 0939 Last data filed at 06/17/2020 0700 Gross per 24 hour  Intake 1907.5 ml  Output 2965 ml  Net -1057.5 ml   Brisk doppler flow right foot pink warm Left leg cool mottled below knee warm left thigh to just above knee] Hematoma right groin area unchanged  Assessment/Planning: S/p removal infected graft  Leukocytosis probably reactive and left leg related to ischemia.  Continue Vanc Zosyn  Acute blood loss anemia will transfuse 2 U RBC continue to monitor right groin hematoma  Left leg ischemia, left fem pop is occluded no attempts at limb salvage at this point.  Will need AKA over the next several days will get left iliac profunda duplex to make sure we have adequate flow to heal AKA  Will need VAC change on Monday  Protein calorie malnutrition encourage PO intake daughter can bring food if she wishes  D/c Foley  Needs to get out of bed  Transfer to 4E  Daughter updated at bedside  Holly Stevenson 06/17/2020 9:39 AM --  Laboratory Lab Results: Recent Labs    06/16/20 0825 06/17/20 0433  WBC 13.7* 16.1*  HGB 10.2* 8.4*  HCT 29.9* 24.9*  PLT 98* 89*   BMET Recent Labs    06/16/20 0825 06/17/20 0433  NA 138 136  K 4.3 3.6  CL 110 107  CO2 16* 17*  GLUCOSE 220* 189*  BUN 17 16  CREATININE 0.90 0.94  CALCIUM 7.1* 7.4*    COAG Lab Results  Component Value Date   INR 1.0 05/03/2020   INR 1.0 04/25/2020   No results found for: PTT     

## 2020-06-18 ENCOUNTER — Encounter (HOSPITAL_COMMUNITY): Payer: Self-pay | Admitting: Vascular Surgery

## 2020-06-18 ENCOUNTER — Encounter (HOSPITAL_COMMUNITY)
Admission: AD | Disposition: A | Discharge status: Hospice | Payer: Self-pay | Source: Home / Self Care | Attending: Vascular Surgery

## 2020-06-18 ENCOUNTER — Inpatient Hospital Stay (HOSPITAL_COMMUNITY): Payer: BC Managed Care – PPO | Admitting: Certified Registered Nurse Anesthetist

## 2020-06-18 HISTORY — PX: AMPUTATION: SHX166

## 2020-06-18 HISTORY — PX: BELOW KNEE LEG AMPUTATION: SUR23

## 2020-06-18 HISTORY — PX: APPLICATION OF WOUND VAC: SHX5189

## 2020-06-18 LAB — TYPE AND SCREEN
ABO/RH(D): O NEG
Antibody Screen: NEGATIVE

## 2020-06-18 LAB — CBC
HCT: 26.3 % — ABNORMAL LOW (ref 36.0–46.0)
Hemoglobin: 8.5 g/dL — ABNORMAL LOW (ref 12.0–15.0)
MCH: 28 pg (ref 26.0–34.0)
MCHC: 32.3 g/dL (ref 30.0–36.0)
MCV: 86.5 fL (ref 80.0–100.0)
Platelets: 77 10*3/uL — ABNORMAL LOW (ref 150–400)
RBC: 3.04 MIL/uL — ABNORMAL LOW (ref 3.87–5.11)
RDW: 17 % — ABNORMAL HIGH (ref 11.5–15.5)
WBC: 14 10*3/uL — ABNORMAL HIGH (ref 4.0–10.5)
nRBC: 0 % (ref 0.0–0.2)

## 2020-06-18 LAB — GLUCOSE, CAPILLARY
Glucose-Capillary: 134 mg/dL — ABNORMAL HIGH (ref 70–99)
Glucose-Capillary: 187 mg/dL — ABNORMAL HIGH (ref 70–99)
Glucose-Capillary: 159 mg/dL — ABNORMAL HIGH (ref 70–99)
Glucose-Capillary: 165 mg/dL — ABNORMAL HIGH (ref 70–99)

## 2020-06-18 SURGERY — AMPUTATION, ABOVE KNEE
Anesthesia: General | Site: Knee | Laterality: Left

## 2020-06-18 MED ORDER — SUGAMMADEX SODIUM 200 MG/2ML IV SOLN
INTRAVENOUS | Status: DC | PRN
  Administered 2020-06-18: 200 mg via INTRAVENOUS

## 2020-06-18 MED ORDER — HYDRALAZINE HCL 20 MG/ML IJ SOLN
INTRAMUSCULAR | Status: AC
  Filled 2020-06-18: qty 1

## 2020-06-18 MED ORDER — OXYCODONE HCL 5 MG PO TABS
ORAL_TABLET | ORAL | Status: AC
  Filled 2020-06-18: qty 1

## 2020-06-18 MED ORDER — ONDANSETRON HCL 4 MG/2ML IJ SOLN
INTRAMUSCULAR | Status: DC | PRN
  Administered 2020-06-18: 4 mg via INTRAVENOUS

## 2020-06-18 MED ORDER — FENTANYL CITRATE (PF) 250 MCG/5ML IJ SOLN
INTRAMUSCULAR | Status: DC | PRN
  Administered 2020-06-18 (×2): 50 ug via INTRAVENOUS
  Administered 2020-06-18: 25 ug via INTRAVENOUS
  Administered 2020-06-18: 50 ug via INTRAVENOUS
  Administered 2020-06-18: 25 ug via INTRAVENOUS
  Administered 2020-06-18: 50 ug via INTRAVENOUS

## 2020-06-18 MED ORDER — PROPOFOL 10 MG/ML IV BOLUS
INTRAVENOUS | Status: DC | PRN
  Administered 2020-06-18: 120 mg via INTRAVENOUS

## 2020-06-18 MED ORDER — ROCURONIUM BROMIDE 10 MG/ML (PF) SYRINGE
PREFILLED_SYRINGE | INTRAVENOUS | Status: DC | PRN
  Administered 2020-06-18: 50 mg via INTRAVENOUS

## 2020-06-18 MED ORDER — FENTANYL CITRATE (PF) 100 MCG/2ML IJ SOLN
INTRAMUSCULAR | Status: AC
  Administered 2020-06-18: 50 ug via INTRAVENOUS
  Filled 2020-06-18: qty 2

## 2020-06-18 MED ORDER — DEXAMETHASONE SODIUM PHOSPHATE 10 MG/ML IJ SOLN
INTRAMUSCULAR | Status: DC | PRN
  Administered 2020-06-18: 5 mg via INTRAVENOUS

## 2020-06-18 MED ORDER — ONDANSETRON HCL 4 MG/2ML IJ SOLN: 4.0000 mg | Freq: Once | INTRAMUSCULAR | Status: DC | PRN

## 2020-06-18 MED ORDER — FENTANYL CITRATE (PF) 100 MCG/2ML IJ SOLN
25.0000 ug | INTRAMUSCULAR | Status: DC | PRN
Start: 2020-06-18 — End: 2020-06-18
  Administered 2020-06-18: 50 ug via INTRAVENOUS

## 2020-06-18 MED ORDER — SODIUM CHLORIDE 0.9 % IR SOLN
Status: DC | PRN
  Administered 2020-06-18: 3000 mL

## 2020-06-18 MED ORDER — ORAL CARE MOUTH RINSE: 15.0000 mL | Freq: Once | OROMUCOSAL | Status: AC

## 2020-06-18 MED ORDER — MIDAZOLAM HCL 2 MG/2ML IJ SOLN
INTRAMUSCULAR | Status: DC | PRN
  Administered 2020-06-18: 2 mg via INTRAVENOUS

## 2020-06-18 MED ORDER — BENAZEPRIL HCL 5 MG PO TABS
10.0000 mg | ORAL_TABLET | Freq: Every day | ORAL | Status: DC
  Administered 2020-06-18 – 2020-06-20 (×3): 10 mg via ORAL
  Filled 2020-06-18 (×3): qty 2

## 2020-06-18 MED ORDER — MIDAZOLAM HCL 2 MG/2ML IJ SOLN
INTRAMUSCULAR | Status: AC
  Filled 2020-06-18: qty 2

## 2020-06-18 MED ORDER — LACTATED RINGERS IV SOLN: INTRAVENOUS | Status: DC

## 2020-06-18 MED ORDER — LIDOCAINE 2% (20 MG/ML) 5 ML SYRINGE
INTRAMUSCULAR | Status: DC | PRN
  Administered 2020-06-18: 40 mg via INTRAVENOUS

## 2020-06-18 MED ORDER — CHLORHEXIDINE GLUCONATE 0.12 % MT SOLN: 15.0000 mL | Freq: Once | OROMUCOSAL | Status: AC

## 2020-06-18 MED ORDER — FENTANYL CITRATE (PF) 250 MCG/5ML IJ SOLN
INTRAMUSCULAR | Status: AC
  Filled 2020-06-18: qty 5

## 2020-06-18 MED ORDER — OXYCODONE HCL 5 MG PO TABS
5.0000 mg | ORAL_TABLET | Freq: Once | ORAL | Status: AC | PRN
  Administered 2020-06-18: 5 mg via ORAL

## 2020-06-18 MED ORDER — PROPOFOL 10 MG/ML IV BOLUS
INTRAVENOUS | Status: AC
  Filled 2020-06-18: qty 20

## 2020-06-18 MED ORDER — OXYCODONE HCL 5 MG/5ML PO SOLN: 5.0000 mg | Freq: Once | ORAL | Status: AC | PRN

## 2020-06-18 MED ORDER — CHLORHEXIDINE GLUCONATE 0.12 % MT SOLN
OROMUCOSAL | Status: AC
  Administered 2020-06-18: 15 mL via OROMUCOSAL
  Filled 2020-06-18: qty 15

## 2020-06-18 SURGICAL SUPPLY — 46 items
BLADE SAW RECIP 87.9 MT (BLADE) ×3 IMPLANT
BNDG COHESIVE 4X5 TAN STRL (GAUZE/BANDAGES/DRESSINGS) ×3 IMPLANT
BNDG COHESIVE 6X5 TAN STRL LF (GAUZE/BANDAGES/DRESSINGS) ×3 IMPLANT
BNDG ELASTIC 4X5.8 VLCR STR LF (GAUZE/BANDAGES/DRESSINGS) ×1 IMPLANT
BNDG ELASTIC 6X5.8 VLCR STR LF (GAUZE/BANDAGES/DRESSINGS) ×3 IMPLANT
BNDG GAUZE ELAST 4 BULKY (GAUZE/BANDAGES/DRESSINGS) ×3 IMPLANT
CANISTER SUCT 3000ML PPV (MISCELLANEOUS) ×3 IMPLANT
CANISTER WOUND CARE 500ML ATS (WOUND CARE) ×1 IMPLANT
CLIP VESOCCLUDE MED 6/CT (CLIP) IMPLANT
CONNECTOR Y ATS VAC SYSTEM (MISCELLANEOUS) ×1 IMPLANT
COVER SURGICAL LIGHT HANDLE (MISCELLANEOUS) ×3 IMPLANT
COVER WAND RF STERILE (DRAPES) ×3 IMPLANT
DRAIN CHANNEL 19F RND (DRAIN) IMPLANT
DRAPE HALF SHEET 40X57 (DRAPES) ×3 IMPLANT
DRAPE ORTHO SPLIT 77X108 STRL (DRAPES)
DRAPE SURG ORHT 6 SPLT 77X108 (DRAPES) ×4 IMPLANT
DRAPE UNIVERSAL PACK (DRAPES) ×1 IMPLANT
DRSG ADAPTIC 3X8 NADH LF (GAUZE/BANDAGES/DRESSINGS) ×3 IMPLANT
DRSG VAC ATS SM SENSATRAC (GAUZE/BANDAGES/DRESSINGS) ×2 IMPLANT
ELECT CAUTERY BLADE 6.4 (BLADE) ×3 IMPLANT
ELECT REM PT RETURN 9FT ADLT (ELECTROSURGICAL) ×3
ELECTRODE REM PT RTRN 9FT ADLT (ELECTROSURGICAL) ×2 IMPLANT
EVACUATOR SILICONE 100CC (DRAIN) IMPLANT
GAUZE SPONGE 4X4 12PLY STRL (GAUZE/BANDAGES/DRESSINGS) ×3 IMPLANT
GAUZE SPONGE 4X4 12PLY STRL LF (GAUZE/BANDAGES/DRESSINGS) ×1 IMPLANT
GLOVE BIO SURGEON STRL SZ7.5 (GLOVE) ×3 IMPLANT
GOWN STRL REUS W/ TWL LRG LVL3 (GOWN DISPOSABLE) ×6 IMPLANT
GOWN STRL REUS W/TWL LRG LVL3 (GOWN DISPOSABLE) ×6
KIT BASIN OR (CUSTOM PROCEDURE TRAY) ×3 IMPLANT
KIT TURNOVER KIT B (KITS) ×3 IMPLANT
NS IRRIG 1000ML POUR BTL (IV SOLUTION) ×3 IMPLANT
PACK GENERAL/GYN (CUSTOM PROCEDURE TRAY) ×3 IMPLANT
PAD ARMBOARD 7.5X6 YLW CONV (MISCELLANEOUS) ×6 IMPLANT
STAPLER VISISTAT 35W (STAPLE) ×4 IMPLANT
STOCKINETTE IMPERVIOUS LG (DRAPES) ×3 IMPLANT
SUT ETHILON 3 0 PS 1 (SUTURE) IMPLANT
SUT SILK 2 0 SH (SUTURE) IMPLANT
SUT SILK 2 0 SH CR/8 (SUTURE) ×5 IMPLANT
SUT SILK 2 0 TIES 10X30 (SUTURE) ×3 IMPLANT
SUT VIC AB 2-0 CT1 18 (SUTURE) IMPLANT
SUT VIC AB 2-0 SH 18 (SUTURE) ×6 IMPLANT
SUT VIC AB 3-0 SH 27 (SUTURE) ×1
SUT VIC AB 3-0 SH 27X BRD (SUTURE) ×2 IMPLANT
TOWEL GREEN STERILE (TOWEL DISPOSABLE) ×6 IMPLANT
UNDERPAD 30X36 HEAVY ABSORB (UNDERPADS AND DIAPERS) ×3 IMPLANT
WATER STERILE IRR 1000ML POUR (IV SOLUTION) ×3 IMPLANT

## 2020-06-18 NOTE — Progress Notes (Signed)
Pt transfered to OR at 06:35 am with 3 staff accompany. Pt's hemodynamically stable, no distress noted while transferring.   Concerned about Pt's left arm swelling and bruising from old peripheral IV. Reported given to March, RN in [place].She will notify Dr. Darrick Penna in OR.  Filiberto Pinks, RN

## 2020-06-18 NOTE — Anesthesia Preprocedure Evaluation (Addendum)
Anesthesia Evaluation  Patient identified by MRN, date of birth, ID band Patient awake    Reviewed: Allergy & Precautions, NPO status , Patient's Chart, lab work & pertinent test results  Airway Mallampati: II  TM Distance: >3 FB Neck ROM: Full    Dental  (+) Edentulous Upper, Partial Lower, Dental Advisory Given   Pulmonary Current Smoker and Patient abstained from smoking.,    breath sounds clear to auscultation       Cardiovascular hypertension,  Rhythm:Regular Rate:Normal     Neuro/Psych    GI/Hepatic   Endo/Other  diabetes  Renal/GU      Musculoskeletal   Abdominal   Peds  Hematology   Anesthesia Other Findings   Reproductive/Obstetrics                             Anesthesia Physical Anesthesia Plan  ASA: III  Anesthesia Plan: General   Post-op Pain Management:    Induction: Intravenous  PONV Risk Score and Plan: Ondansetron  Airway Management Planned: Oral ETT  Additional Equipment:   Intra-op Plan:   Post-operative Plan:   Informed Consent: I have reviewed the patients History and Physical, chart, labs and discussed the procedure including the risks, benefits and alternatives for the proposed anesthesia with the patient or authorized representative who has indicated his/her understanding and acceptance.     Dental advisory given  Plan Discussed with: CRNA and Anesthesiologist  Anesthesia Plan Comments:         Anesthesia Quick Evaluation

## 2020-06-18 NOTE — Interval H&P Note (Signed)
History and Physical Interval Note:  06/18/2020 7:41 AM  Holly Stevenson  has presented today for surgery, with the diagnosis of GANGRENE LEFT LEG.  The various methods of treatment have been discussed with the patient and family. After consideration of risks, benefits and other options for treatment, the patient has consented to  Procedure(s): AMPUTATION ABOVE KNEE (Left) as a surgical intervention.  The patient's history has been reviewed, patient examined, no change in status, stable for surgery.  I have reviewed the patient's chart and labs.  Questions were answered to the patient's satisfaction.     Fabienne Bruns

## 2020-06-18 NOTE — Progress Notes (Signed)
Orthopedic Tech Progress Note Patient Details:  Holly Stevenson [birth year] [id number] Called in order to [facility] Patient ID: [name], female   DOB: May 16, 1951, 70 y.o.   MRN: [id number]   Lovett Calender 06/18/2020, 5:22 PM

## 2020-06-18 NOTE — Anesthesia Procedure Notes (Signed)
Procedure Name: Intubation Date/Time: 06/18/2020 8:14 AM Performed by: Clearnce Sorrel, CRNA Pre-anesthesia Checklist: Patient identified, Emergency Drugs available, Suction available, Patient being monitored and Timeout performed Patient Re-evaluated:Patient Re-evaluated prior to induction Oxygen Delivery Method: Circle system utilized Preoxygenation: Pre-oxygenation with 100% oxygen Induction Type: IV induction Ventilation: Mask ventilation without difficulty Laryngoscope Size: Mac and 3 Grade View: Grade I Tube type: Oral Tube size: 7.0 mm Number of attempts: 1 Airway Equipment and Method: Stylet Placement Confirmation: ETT inserted through vocal cords under direct vision,  positive ETCO2 and breath sounds checked- equal and bilateral Secured at: 23 cm Tube secured with: Tape Dental Injury: Teeth and Oropharynx as per pre-operative assessment

## 2020-06-18 NOTE — Progress Notes (Signed)
Pt arrived back to 4e from PACU. Left AKA with compression wrap on. Bilateral wound vacs in place. PCA hooked back up. BP elevated, MD aware. PRN medicine given in PACU.

## 2020-06-18 NOTE — Progress Notes (Signed)
P[t in PACU stable. Hypertensive Some pain Will resume PCA upstairs Daughter updated by phone  Fabienne Bruns, MD [facility] [place] Office: [phone]

## 2020-06-18 NOTE — Anesthesia Postprocedure Evaluation (Signed)
Anesthesia Post Note  Patient: Holly Stevenson  Procedure(s) Performed: LEFT ABOVE KNEE AMPUTATION (Left Knee) EXCHANGE OF WOUND VAC (Bilateral Groin)     Patient location during evaluation: PACU Anesthesia Type: General Level of consciousness: awake and alert Pain management: pain level controlled Vital Signs Assessment: post-procedure vital signs reviewed and stable Respiratory status: spontaneous breathing, nonlabored ventilation, respiratory function stable and patient connected to nasal cannula oxygen Cardiovascular status: blood pressure returned to baseline and stable Postop Assessment: no apparent nausea or vomiting Anesthetic complications: no   No complications documented.  Last Vitals:  Vitals:   06/18/20 1105 06/18/20 1123  BP:  (!) 177/55  Pulse:  78  Resp: 20 20  Temp:    SpO2: 93% 92%    Last Pain:  Vitals:   06/18/20 1129  TempSrc:   PainSc: 7                  Danne Scardina COKER

## 2020-06-18 NOTE — Op Note (Addendum)
[facility] OPERATIVE NOTE   Procedure: Left above knee amputation, bilateral groin VAC change  Preoperative diagnosis: Ischemia left leg  Postoperative diagnosis: Same  Surgeon(s): Sherren Kerns, MD  ASSISTANT: Wendi Maya, PA-C expedite procedure  Anesthesia: General  Specimens: Left leg  Findings: Sections of noncontractile muscle, minimal bleeding  PROCEDURE DETAIL: After obtaining informed consent, the patient was taken to the operating room. The patient was placed in supine position the operating room table. After induction of general anesthesia and endotracheal intubation the patient's entire left lower extremity was prepped and draped in usual sterile fashion. A circumferential incision was made at the mid thigh level. TSoft tissues were taken down as well as the muscle and fascia with cautery.  The anterior compartments of the thigh had some contractility as well as sections of the posterior and lateral but there were also sections of muscle that were noncontractile.  There was some hematoma from previous operation.  This was all irrigated with normal saline solution.  The superficial femoral artery and vein were dissected free circumferentially clamped and divided. These were ligated proximally. Remainder of the soft tissues were taken down with cautery. The periosteum was raised on the femur approximately 5 cm above the skin edge. The femur was divided at this level. The leg was passed off the table as a specimen. Hemostasis was obtained. The wound was thoroughly irrigated with normal saline solution. The fascial edges were reapproximated using interrupted 2 0 Vicryl sutures. The subcutaneous tissues reapproximated using a running 3-0 Vicryl suture. The skin was closed staples. Patient tolerated procedure well and there were no complications.  At this point the patient's bilateral groin VAC dressings were removed.  There was some fibrinous exudate but some  evidence of granulation tissue in the left groin about 90%, the right groin had several areas of dusky muscle and mall perfused looking tissue.  However there was no frank necrotic debris.  There was some fibrinous exudate.  The wounds were thoroughly irrigated with normal saline solution and the VAC replaced on both groins.    Instrument sponge and needle counts correct in the case. Patient was taken to recovery in stable condition.  Fabienne Bruns, MD [facility] [place] Office: [phone] Pager: [phone]

## 2020-06-18 NOTE — Progress Notes (Signed)
Started Morphine PCA pump with 2 mg loading dose given. 2 ml tube priming. PCA instruction given. Pt understood how to operate PCA with teach back. Pt tolerated pain well. She's hemodynamically stable. NSR on monitor.BP stable. No respiratory distress.  Absent pulses on left DP and PT and cold touch. MD aware. Right leg warm, PT and DP detected patent signals with doppler. Wound vac on bilateral groins seal intact, no leaking. Serosanguinous drainage appeared in cannister. No active bleeding.  Dr. Darrick Penna called, pre op order reviewed for left AKA at am. Informed consent signed. Pt had no questions about the procedure at this time. Pt called her daughter and she agreed for surgery. Daughter stated she already talked with Dr. Darrick Penna.  We continue to monitor.  Filiberto Pinks, RN

## 2020-06-18 NOTE — Transfer of Care (Signed)
Immediate Anesthesia Transfer of Care Note  Patient: Holly Stevenson  Procedure(s) Performed: LEFT ABOVE KNEE AMPUTATION (Left Knee) EXCHANGE OF WOUND VAC (Bilateral Groin)  Patient Location: PACU  Anesthesia Type:General  Level of Consciousness: awake, alert  and oriented  Airway & Oxygen Therapy: Patient Spontanous Breathing  Post-op Assessment: Report given to RN and Post -op Vital signs reviewed and stable  Post vital signs: Reviewed and stable  Last Vitals:  Vitals Value Taken Time  BP 181/109 06/18/20 0927  Temp    Pulse 77 06/18/20 0934  Resp 18 06/18/20 0934  SpO2 95 % 06/18/20 0934  Vitals shown include unvalidated device data.  Last Pain:  Vitals:   06/18/20 0401  TempSrc:   PainSc: 0-No pain      Patients Stated Pain Goal: 2 (06/17/20 0405)  Complications: No complications documented.

## 2020-06-18 NOTE — Progress Notes (Signed)
Mobility Specialist: Progress Note   06/18/20 1559  Mobility  Activity Dangled on edge of bed  Level of Assistance Minimal assist, patient does 75% or more  Mobility Response Tolerated well  Mobility performed by Mobility specialist  $Mobility charge 1 Mobility   Pre-Mobility: 85 HR, 188/59 BP, 94% SpO2 Post-Mobility: 78 HR, 180/54 BP, 95% SpO2  Pt dangled EOB for 3 minutes. Pt said it felt good to sit up. Wound vac alarm sounded for leak, RN notified. Will f/u tomorrow.   [facility] Akaiya Touchette Mobility Specialist

## 2020-06-19 ENCOUNTER — Encounter (HOSPITAL_COMMUNITY): Payer: Self-pay | Admitting: Vascular Surgery

## 2020-06-19 LAB — TYPE AND SCREEN
ABO/RH(D): O NEG
Antibody Screen: NEGATIVE
Unit division: 0
Unit division: 0
Unit division: 0
Unit division: 0
Unit division: 0
Unit division: 0
Unit division: 0
Unit division: 0
Unit division: 0
Unit division: 0
Unit division: 0
Unit division: 0
Unit division: 0
Unit division: 0

## 2020-06-19 LAB — BPAM RBC
Blood Product Expiration Date: 202201032359
Blood Product Expiration Date: 202201122359
Blood Product Expiration Date: 202201132359
Blood Product Expiration Date: 202201132359
Blood Product Expiration Date: 202201172359
Blood Product Expiration Date: 202201182359
Blood Product Expiration Date: 202201182359
Blood Product Expiration Date: 202201182359
Blood Product Expiration Date: 202202032359
Blood Product Expiration Date: 202202032359
Blood Product Expiration Date: 202202032359
Blood Product Expiration Date: 202202032359
Blood Product Expiration Date: 202202032359
Blood Product Expiration Date: 202202032359
ISSUE DATE / TIME: 202112300342
ISSUE DATE / TIME: 202112301227
ISSUE DATE / TIME: 202112301455
ISSUE DATE / TIME: 202112301455
ISSUE DATE / TIME: 202112301925
ISSUE DATE / TIME: 202112301925
ISSUE DATE / TIME: 202112310206
ISSUE DATE / TIME: 202112310206
ISSUE DATE / TIME: 202112310206
ISSUE DATE / TIME: 202112310206
ISSUE DATE / TIME: 202112310518
ISSUE DATE / TIME: 202112310518
ISSUE DATE / TIME: 202201011018
ISSUE DATE / TIME: 202201011445
Unit Type and Rh: 5100
Unit Type and Rh: 5100
Unit Type and Rh: 5100
Unit Type and Rh: 5100
Unit Type and Rh: 5100
Unit Type and Rh: 5100
Unit Type and Rh: 9500
Unit Type and Rh: 9500
Unit Type and Rh: 9500
Unit Type and Rh: 9500
Unit Type and Rh: 9500
Unit Type and Rh: 9500
Unit Type and Rh: 9500
Unit Type and Rh: 9500

## 2020-06-19 LAB — HEPATIC FUNCTION PANEL
ALT: 54 U/L — ABNORMAL HIGH (ref 0–44)
AST: 107 U/L — ABNORMAL HIGH (ref 15–41)
Albumin: 2 g/dL — ABNORMAL LOW (ref 3.5–5.0)
Alkaline Phosphatase: 54 U/L (ref 38–126)
Bilirubin, Direct: 0.3 mg/dL — ABNORMAL HIGH (ref 0.0–0.2)
Indirect Bilirubin: 0.8 mg/dL (ref 0.3–0.9)
Total Bilirubin: 1.1 mg/dL (ref 0.3–1.2)
Total Protein: 5.3 g/dL — ABNORMAL LOW (ref 6.5–8.1)

## 2020-06-19 LAB — GLUCOSE, CAPILLARY
Glucose-Capillary: 134 mg/dL — ABNORMAL HIGH (ref 70–99)
Glucose-Capillary: 132 mg/dL — ABNORMAL HIGH (ref 70–99)
Glucose-Capillary: 174 mg/dL — ABNORMAL HIGH (ref 70–99)
Glucose-Capillary: 197 mg/dL — ABNORMAL HIGH (ref 70–99)
Glucose-Capillary: 207 mg/dL — ABNORMAL HIGH (ref 70–99)

## 2020-06-19 LAB — PREALBUMIN: Prealbumin: 9.3 mg/dL — ABNORMAL LOW (ref 18–38)

## 2020-06-19 MED ORDER — HEPARIN SODIUM (PORCINE) 5000 UNIT/ML IJ SOLN
5000.0000 [IU] | Freq: Three times a day (TID) | INTRAMUSCULAR | Status: DC
  Administered 2020-06-19 – 2020-06-27 (×23): 5000 [IU] via SUBCUTANEOUS
  Filled 2020-06-19 (×22): qty 1

## 2020-06-19 MED ORDER — BOOST / RESOURCE BREEZE PO LIQD CUSTOM
237.0000 mL | Freq: Three times a day (TID) | ORAL | Status: DC
  Administered 2020-06-19 – 2020-06-20 (×4): 1 via ORAL
  Filled 2020-06-19: qty 1

## 2020-06-19 MED ORDER — SODIUM CHLORIDE 0.9 % IV SOLN
2.0000 g | Freq: Four times a day (QID) | INTRAVENOUS | Status: DC
  Administered 2020-06-19 – 2020-06-27 (×32): 2 g via INTRAVENOUS
  Filled 2020-06-19 (×2): qty 2000
  Filled 2020-06-19 (×2): qty 2
  Filled 2020-06-19: qty 2000
  Filled 2020-06-19 (×2): qty 2
  Filled 2020-06-19 (×2): qty 2000
  Filled 2020-06-19: qty 2
  Filled 2020-06-19: qty 2000
  Filled 2020-06-19: qty 2
  Filled 2020-06-19: qty 2000
  Filled 2020-06-19: qty 2
  Filled 2020-06-19 (×2): qty 2000
  Filled 2020-06-19 (×2): qty 2
  Filled 2020-06-19: qty 2000
  Filled 2020-06-19: qty 2
  Filled 2020-06-19: qty 2000
  Filled 2020-06-19: qty 2
  Filled 2020-06-19: qty 2000
  Filled 2020-06-19 (×2): qty 2
  Filled 2020-06-19 (×7): qty 2000
  Filled 2020-06-19: qty 2
  Filled 2020-06-19: qty 2000

## 2020-06-19 MED ORDER — SENNOSIDES-DOCUSATE SODIUM 8.6-50 MG PO TABS
1.0000 | ORAL_TABLET | Freq: Two times a day (BID) | ORAL | Status: DC
  Administered 2020-06-19 – 2020-07-01 (×21): 1 via ORAL
  Filled 2020-06-19 (×23): qty 1

## 2020-06-19 NOTE — Plan of Care (Signed)
  Problem: Nutrition: Goal: Adequate nutrition will be maintained Outcome: Progressing   Problem: Health Behavior/Discharge Planning: Goal: Ability to manage health-related needs will improve Outcome: Not Progressing   

## 2020-06-19 NOTE — Progress Notes (Signed)
Inpatient Rehab Admissions Coordinator Note:   Per PT/OT recommendations, pt was screened for CIR candidacy by Wolfgang Phoenix, MS, CCC-SLP.  At this time we are awaiting updated PT/OT treatment notes to help determine candidacy for CIR.  Please contact me with questions.    Wolfgang Phoenix, MS, CCC-SLP Admissions Coordinator [phone] 06/19/20 10:46 AM

## 2020-06-19 NOTE — Progress Notes (Signed)
Wound VAC pressure =186mmHg per PA order. New canister changed.  Lawson Radar, RN

## 2020-06-19 NOTE — Progress Notes (Signed)
Mobility Specialist: Progress Note   06/19/20 1337  Mobility  Activity Dangled on edge of bed  Level of Assistance Moderate assist, patient does 50-74%  Assistive Device None  Mobility Response Tolerated well  Mobility performed by Mobility specialist  $Mobility charge 1 Mobility   Pre-Mobility: 74 HR, 97% SpO2 Post-Mobility: 73 HR, 95% SpO2  Pt dangled EOB for 5 minutes. Pt said she likes sitting up and feels better when she does. Pt was modA to EOB requiring trunk support.   [facility] Davyd Podgorski Mobility Specialist

## 2020-06-19 NOTE — Progress Notes (Addendum)
  Progress Note    06/19/2020 8:04 AM 1 Day Post-Op  Subjective:  Siting up eating breakfast this morning.  Pain control adequate.   Vitals:   06/19/20 0451 06/19/20 0453  BP:  (!) 173/65  Pulse:  71  Resp: 18 15  Temp:  98.3 F (36.8 C)  SpO2: 94% 93%   Physical Exam: Lungs:  Non labored Incisions:  B groin incisions with vacs in place, good seal; L AKA with dressing left in place; vein harvest incisions of RLE dry Extremities:  R DP and PT by doppler Abdomen:  soft Neurologic: A&O  CBC    Component Value Date/Time   WBC 14.0 (H) 06/18/2020 0152   RBC 3.04 (L) 06/18/2020 0152   HGB 8.5 (L) 06/18/2020 0152   HCT 26.3 (L) 06/18/2020 0152   PLT 77 (L) 06/18/2020 0152   MCV 86.5 06/18/2020 0152   MCH 28.0 06/18/2020 0152   MCHC 32.3 06/18/2020 0152   RDW 17.0 (H) 06/18/2020 0152   LYMPHSABS 1.3 04/25/2020 0345   MONOABS 1.1 (H) 04/25/2020 0345   EOSABS 0.1 04/25/2020 0345   BASOSABS 0.0 04/25/2020 0345    BMET    Component Value Date/Time   NA 136 06/17/2020 0433   K 3.6 06/17/2020 0433   CL 107 06/17/2020 0433   CO2 17 (L) 06/17/2020 0433   GLUCOSE 189 (H) 06/17/2020 0433   BUN 16 06/17/2020 0433   CREATININE 0.94 06/17/2020 0433   CALCIUM 7.4 (L) 06/17/2020 0433   GFRNONAA >60 06/17/2020 0433   GFRAA 52 (L) 02/18/2018 1610    INR    Component Value Date/Time   INR 1.0 05/03/2020 1530     Intake/Output Summary (Last 24 hours) at 06/19/2020 0804 Last data filed at 06/19/2020 0646 Gross per 24 hour  Intake 1231.99 ml  Output 350 ml  Net 881.99 ml     Assessment/Plan:  70 y.o. female is s/p excision of infected graft material with subsequent L AKA 1 Day Post-Op   RLE well perfused with brisk DP and PT signal by doppler Dry dressing changes RLE vein harvest incisions daily Change L AKA dressing tomorrow Bilateral groin wound vac; tentative plan for washout and vac change in OR 1/5 Restart subq heparin   Emilie Rutter, PA-C [facility] [phone] 06/19/2020 8:04 AM  VASCULAR STAFF ADDENDUM: I have independently interviewed and examined the patient. I agree with the above.  Weekend events reviewed in detail with my partners. Greatly appreciate Drs. Randie Heinz and Fields assistance with this difficult case. Patient appears well - pain well controlled. In good spirits. VACs in place with good seal.  L AKA dressing clean and dry. Reviewed plan of care going forward: optimize nutrition. Monitor AKA stump. Continue local care to the groins.  Will check prealbumin / albumin today. Add nutrition supplementation. Ask dietary to see.  If prealbumin worrisome, will consider CorTrak and nocturnal TF. PT/OT/OOB as able. Discussed with daughter Efraim Kaufmann.  Rande Brunt. Lenell Antu, MD [facility] Phone Number: [phone] 06/19/2020 9:56 AM

## 2020-06-19 NOTE — Addendum Note (Signed)
Addendum  created 06/19/20 0714 by De Nurse, CRNA   Intraprocedure Meds edited

## 2020-06-19 NOTE — Progress Notes (Signed)
Blood pressures still elevated. 40 mg hydralazine given with little effect. Patient denies any symptoms.

## 2020-06-19 NOTE — Progress Notes (Signed)
1.68ml of Morphine 1mg /ml  PCA wasted in steri cycle by RN and Halina Andreas RN, will continue to monitor.

## 2020-06-20 ENCOUNTER — Encounter (HOSPITAL_COMMUNITY): Payer: Self-pay | Admitting: Vascular Surgery

## 2020-06-20 LAB — AEROBIC/ANAEROBIC CULTURE W GRAM STAIN (SURGICAL/DEEP WOUND)

## 2020-06-20 LAB — GLUCOSE, CAPILLARY
Glucose-Capillary: 154 mg/dL — ABNORMAL HIGH (ref 70–99)
Glucose-Capillary: 189 mg/dL — ABNORMAL HIGH (ref 70–99)
Glucose-Capillary: 213 mg/dL — ABNORMAL HIGH (ref 70–99)
Glucose-Capillary: 217 mg/dL — ABNORMAL HIGH (ref 70–99)

## 2020-06-20 LAB — SURGICAL PATHOLOGY

## 2020-06-20 MED ORDER — PROSOURCE PLUS PO LIQD
30.0000 mL | Freq: Two times a day (BID) | ORAL | Status: DC
  Administered 2020-06-20 – 2020-06-26 (×10): 30 mL via ORAL
  Filled 2020-06-20 (×11): qty 30

## 2020-06-20 MED ORDER — LABETALOL HCL 5 MG/ML IV SOLN
10.0000 mg | INTRAVENOUS | Status: DC | PRN
  Administered 2020-06-20 – 2020-06-21 (×5): 10 mg via INTRAVENOUS
  Filled 2020-06-20 (×5): qty 4

## 2020-06-20 MED ORDER — GLUCERNA SHAKE PO LIQD
237.0000 mL | Freq: Three times a day (TID) | ORAL | Status: DC
  Administered 2020-06-20 – 2020-07-03 (×25): 237 mL via ORAL

## 2020-06-20 NOTE — Progress Notes (Signed)
BP 207/76. Hydralazine 40 mg given. BP 180/58 after hydralazine. MD notified. Ordered 10 mg labetalol. BP 158/58 after labetalol. Will continue to monitor.  Sabra Heck, RN

## 2020-06-20 NOTE — Progress Notes (Signed)
Will give labetalol 10mg  IV

## 2020-06-20 NOTE — Evaluation (Addendum)
Occupational Therapy Evaluation Patient Details Name: Holly Stevenson MRN: [id number] DOB: 21-Nov-1950 Today's Date: 06/20/2020    History of Present Illness 70 yo admitted 12/29 with right groin dehiscence s/p debridement and graft of fem fem BPG. Additional surgeries 12/30 and 12/31 for endarterectomy and thrombolectomy with resultant LLE BPG occlusion and Rt abdominal hematoma. Pt awaiting LLE AKA. Pt with recent admission 12/16-12/22 for occulsion s/p thrombectomy of Rt to left fem- fem BPG and thrombectomy of left fem pop BPG, bronchoscopy 12/21 for right lung mass. PMHx: HTN, DM, CKD, gout, PAD, COPD, HLD, polyneuropathy   Clinical Impression   Pt presents with decline in function and safety with ADLs and ADL mobility with impaired strength, balance and endurance. Pt is limited by pain in residual limb (s/p L AKA). Pt with functional deficits listed below and would benefit from acute OT services to address impairments to maximize level of function and safety    Follow Up Recommendations  CIR    Equipment Recommendations  3 in 1 bedside commode;Tub/shower bench;Wheelchair (measurements OT);Wheelchair cushion (measurements OT)    Recommendations for Other Services       Precautions / Restrictions Precautions Precautions: Fall Precaution Comments: bil groin VAc, LLE cold/lack of circulation      Mobility Bed Mobility Overal bed mobility: Needs Assistance       Supine to sit: Max assist     General bed mobility comments: Pt able to long sit in bed with Sup using rails, increased time and effort. Pt attempting multiple times to sit EOB max A, unable to complete due to pain and fatigue    Transfers                      Balance Overall balance assessment: Needs assistance Sitting-balance support: Feet supported;Bilateral upper extremity supported Sitting balance-Leahy Scale: Fair Sitting balance - Comments: long sitting in bed                                    ADL either performed or assessed with clinical judgement   ADL Overall ADL's : Needs assistance/impaired Eating/Feeding: Independent;Sitting   Grooming: Wash/dry hands;Wash/dry face;Sitting;Min guard   Upper Body Bathing: Sitting;Min guard   Lower Body Bathing: Maximal assistance;Sitting/lateral leans   Upper Body Dressing : Sitting;Min guard   Lower Body Dressing: Maximal assistance;Sitting/lateral leans       Toileting- Clothing Manipulation and Hygiene: Maximal assistance;Sitting/lateral lean               Vision Patient Visual Report: No change from baseline       Perception     Praxis      Pertinent Vitals/Pain Pain Assessment: Faces Faces Pain Scale: Hurts even more Pain Location: L LE during bed mobility Pain Descriptors / Indicators: Sore;Operative site guarding Pain Intervention(s): Monitored during session;Repositioned;Limited activity within patient's tolerance     Hand Dominance Right   Extremity/Trunk Assessment Upper Extremity Assessment Upper Extremity Assessment: Generalized weakness   Lower Extremity Assessment Lower Extremity Assessment: Defer to PT evaluation   Cervical / Trunk Assessment Cervical / Trunk Assessment: Normal   Communication Communication Communication: No difficulties   Cognition Arousal/Alertness: Awake/alert Behavior During Therapy: WFL for tasks assessed/performed Overall Cognitive Status: Within Functional Limits for tasks assessed  General Comments       Exercises     Shoulder Instructions      Home Living Family/patient expects to be discharged to:: Private residence Living Arrangements: Alone Available Help at Discharge: Family Type of Home: House Home Access: Stairs to enter Secretary/administrator of Steps: flight Entrance Stairs-Rails: Can reach both Home Layout: Two level Alternate Level Stairs-Number of Steps:  flight Alternate Level Stairs-Rails: Can reach both Bathroom Shower/Tub: Tub/shower unit;Tub only;Walk-in shower     Bathroom Accessibility: No   Home Equipment: Walker - standard;Shower seat;Walker - 2 wheels   Additional Comments: Patient stating she will probably have to sell her house and move in with her daughter after rehab, and once she buys a new house.  Daughter's home has 3 STE and one level.  Tub/shower.      Prior Functioning/Environment Level of Independence: Needs assistance  Gait / Transfers Assistance Needed: walking with RW since last admission, assist for stairs ADL's / Homemaking Assistance Needed: patient able to complete sit/stand sink baths independently.  Daughter assists with showers up to 3x/wk.   Comments: works FT as an Immunologist, drives, care for her own meds, meals and home mangement.        OT Problem List: Decreased activity tolerance;Pain;Decreased strength;Decreased range of motion;Impaired balance (sitting and/or standing)      OT Treatment/Interventions: Self-care/ADL training;DME and/or AE instruction;Patient/family education;Balance training;Therapeutic activities    OT Goals(Current goals can be found in the care plan section) Acute Rehab OT Goals Patient Stated Goal: get better and go home OT Goal Formulation: With patient Time For Goal Achievement: 07/04/20 Potential to Achieve Goals: Good ADL Goals Pt Will Perform Grooming: with set-up;with supervision;sitting Pt Will Perform Upper Body Bathing: with supervision;with set-up;sitting Pt Will Perform Lower Body Bathing: with mod assist;sitting/lateral leans Pt Will Perform Upper Body Dressing: with supervision;with set-up;sitting Pt Will Perform Lower Body Dressing: with mod assist;sitting/lateral leans Pt Will Transfer to Toilet: stand pivot transfer;with transfer board;with mod assist;bedside commode Pt Will Perform Toileting - Clothing Manipulation and hygiene: with min  assist;sitting/lateral leans  OT Frequency: Min 2X/week   Barriers to D/C:            Co-evaluation              AM-PAC OT "6 Clicks" Daily Activity     Outcome Measure Help from another person eating meals?: None Help from another person taking care of personal grooming?: A Little Help from another person toileting, which includes using toliet, bedpan, or urinal?: Total Help from another person bathing (including washing, rinsing, drying)?: A Lot Help from another person to put on and taking off regular upper body clothing?: A Little Help from another person to put on and taking off regular lower body clothing?: A Lot 6 Click Score: 15   End of Session    Activity Tolerance: Patient limited by fatigue;Patient limited by pain Patient left: with call bell/phone within reach;in bed  OT Visit Diagnosis: Muscle weakness (generalized) (M62.81);Pain;Other abnormalities of gait and mobility (R26.89) Pain - Right/Left: Left Pain - part of body: Leg (residual limb)                Time: 6720-9470 OT Time Calculation (min): 24 min Charges:  OT General Charges $OT Visit: 1 Visit $OT Re-eval: 1 Re-eval OT Treatments $Self Care/Home Management : 8-22 mins    Galen Manila 06/20/2020, 1:02 PM

## 2020-06-20 NOTE — Progress Notes (Signed)
Physical Therapy Treatment Patient Details Name: Holly Stevenson MRN: [id number] DOB: 05/05/1951 Today's Date: 06/20/2020    History of Present Illness 70 yo admitted 12/29 with right groin dehiscence s/p debridement and graft of fem fem BPG. Additional surgeries 12/30 and 12/31 for endarterectomy and thrombolectomy with resultant LLE BPG occlusion and Rt abdominal hematoma. 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    PT Comments    Pt actively participating in mobilizing in bed with decreased assistance.  Pt required heavy use of bed rails and shows improvement in moving to long sitting.  Plan next session for EOB activities.  Pt continue to benefit from rehab in a post acute setting.     Follow Up Recommendations  CIR;Supervision/Assistance - 24 hour     Equipment Recommendations  Wheelchair (measurements PT);Wheelchair cushion (measurements PT);3in1 (PT)    Recommendations for Other Services       Precautions / Restrictions Precautions Precautions: Fall Precaution Comments: bil groin VAC Restrictions Weight Bearing Restrictions: Yes LLE Weight Bearing: Non weight bearing    Mobility  Bed Mobility Overal bed mobility: Needs Assistance Bed Mobility: Supine to Sit;Rolling (supine to sit in long sitting.) Rolling: Min assist   Supine to sit: Mod assist     General bed mobility comments: Pt performed multiple bouts of rolling to R and L side to clean patient and remove from bed pan.  Pt has improved ease rolling to the R this session.  Pt required cues for hand placement for logrolling with heavy use of bed rails.  Once in supine placed in trendlenberg to pull to top of bed.  .  Transfers                 General transfer comment: Pt refused OOB this pm.  Ambulation/Gait                 Stairs             Wheelchair Mobility    Modified  Rankin (Stroke Patients Only)       Balance     Sitting balance-Leahy Scale: Poor Sitting balance - Comments: long sitting in bed.                                    Cognition Arousal/Alertness: Awake/alert Behavior During Therapy: WFL for tasks assessed/performed Overall Cognitive Status: Within Functional Limits for tasks assessed Area of Impairment: Memory                     Memory: Decreased short-term memory         General Comments: A&Ox4.      Exercises      General Comments        Pertinent Vitals/Pain Pain Assessment: Faces Faces Pain Scale: Hurts even more Pain Location: B LEs. Pain Descriptors / Indicators: Sore;Operative site guarding Pain Intervention(s): Monitored during session;Repositioned    Home Living                      Prior Function            PT Goals (current goals can now be found in the care plan section) Acute Rehab PT Goals Patient Stated Goal: get better and go home Potential to Achieve Goals: Fair Progress towards PT goals: Progressing toward goals  Frequency    Min 3X/week      PT Plan Current plan remains appropriate    Co-evaluation              AM-PAC PT "6 Clicks" Mobility   Outcome Measure  Help needed turning from your back to your side while in a flat bed without using bedrails?: A Little Help needed moving from lying on your back to sitting on the side of a flat bed without using bedrails?: Total Help needed moving to and from a bed to a chair (including a wheelchair)?: Total Help needed standing up from a chair using your arms (e.g., wheelchair or bedside chair)?: Total Help needed to walk in hospital room?: Total Help needed climbing 3-5 steps with a railing? : Total 6 Click Score: 8    End of Session   Activity Tolerance: Patient limited by fatigue (and weakness) Patient left: in bed;with call bell/phone within reach;with family/visitor present Nurse  Communication: Mobility status PT Visit Diagnosis: Unsteadiness on feet (R26.81);Other abnormalities of gait and mobility (R26.89);Muscle weakness (generalized) (M62.81);Difficulty in walking, not elsewhere classified (R26.2);Pain Pain - Right/Left: Right Pain - part of body:  (groin)     Time: 6962-9528 PT Time Calculation (min) (ACUTE ONLY): 28 min  Charges:  $Therapeutic Activity: 23-37 mins                     Bonney Leitz , PTA Acute Rehabilitation Services Pager [phone] Office [phone]     Kaikoa Magro Artis Delay 06/20/2020, 6:00 PM

## 2020-06-20 NOTE — Progress Notes (Addendum)
  Progress Note    06/20/2020 7:34 AM 2 Days Post-Op  Subjective:  No complaints overnight   Vitals:   06/20/20 0400 06/20/20 0417  BP: (!) 168/53   Pulse: 60   Resp: 14 14  Temp:    SpO2: 92% 92%   Physical Exam: Lungs:  Non labored Incisions:  Groin vacs in place with good seal Extremities:  L AKA dressing in place; brisk R DP and PT by doppler Neurologic: A&O  CBC    Component Value Date/Time   WBC 14.0 (H) 06/18/2020 0152   RBC 3.04 (L) 06/18/2020 0152   HGB 8.5 (L) 06/18/2020 0152   HCT 26.3 (L) 06/18/2020 0152   PLT 77 (L) 06/18/2020 0152   MCV 86.5 06/18/2020 0152   MCH 28.0 06/18/2020 0152   MCHC 32.3 06/18/2020 0152   RDW 17.0 (H) 06/18/2020 0152   LYMPHSABS 1.3 04/25/2020 0345   MONOABS 1.1 (H) 04/25/2020 0345   EOSABS 0.1 04/25/2020 0345   BASOSABS 0.0 04/25/2020 0345    BMET    Component Value Date/Time   NA 136 06/17/2020 0433   K 3.6 06/17/2020 0433   CL 107 06/17/2020 0433   CO2 17 (L) 06/17/2020 0433   GLUCOSE 189 (H) 06/17/2020 0433   BUN 16 06/17/2020 0433   CREATININE 0.94 06/17/2020 0433   CALCIUM 7.4 (L) 06/17/2020 0433   GFRNONAA >60 06/17/2020 0433   GFRAA 52 (L) 02/18/2018 1610    INR    Component Value Date/Time   INR 1.0 05/03/2020 1530     Intake/Output Summary (Last 24 hours) at 06/20/2020 0734 Last data filed at 06/20/2020 0636 Gross per 24 hour  Intake 780 ml  Output 460 ml  Net 320 ml     Assessment/Plan:  70 y.o. female is s/p excsision of infected graft material and subsequent L AKA 2 Days Post-Op   RLE well perfused with brisk DP and PT signal by doppler Continue dry dressing changes RLE vein harvest incisions and L AKA Prealbumin 9.3; dietician consulted; Dr. Lenell Antu considering CorTrak placement PT/OT Plan for bilateral groin washout and vac change in OR tomorrow 06/21/20   Emilie Rutter, PA-C [facility] [phone] 06/20/2020 7:34 AM  VASCULAR STAFF ADDENDUM: I have independently  interviewed and examined the patient. I agree with the above.  Looking great this AM. Eating very well this AM. Nutrition assessment yesterday suggests she has been catabolic for a while. Encouraged PO. She seems to be doing well on her own. Encourage supplements. If any backslide in nutritional labs - will place NGT and start TF. OR tomorrow for [facility] change, possible debridement of groin wounds.  Rande Brunt. Lenell Antu, MD [facility] Phone Number: [phone] 06/20/2020 7:45 AM

## 2020-06-20 NOTE — Progress Notes (Signed)
Initial Nutrition Assessment  DOCUMENTATION CODES:   Not applicable  INTERVENTION:    Glucerna Shake po TID, each supplement provides 220 kcal and 10 grams of protein  30 ml ProSource Plus BID, each supplement provides 100 kcals and 15 grams protein.   MVI daily   NUTRITION DIAGNOSIS:   Increased nutrient needs related to post-op healing as evidenced by estimated needs.  GOAL:   Patient will meet greater than or equal to 90% of their needs  MONITOR:   PO intake,Supplement acceptance,Weight trends,Labs,I & O's  REASON FOR ASSESSMENT:   Consult Assessment of nutrition requirement/status  ASSESSMENT:   Patient with PMH significant for DM, HTN, PVD s/p R groin exploration, repair of pseudoaneurysm with interposition grafting, thrombectomy of fem-fem bypass, thrombectomy of L fem-pop bypass. Presents this admission with R groin dehiscence.   12/29- s/p excisional debridement to R groin 12/30- s/p excision of R to L fem-fem bypass, reopening of L groin, stenting 12/31- s/p L common femoral and external iliac endarterectomy, L popliteal artery endarterectomy, stenting  1/02- L AKA, bilateral groin VAC change  Pt denies loss in appetite PTA. Typically eats three meals daily that consists of B- grits, eggs, toast L- sandwich, soup D- sandwich, soup. Does not use supplementation. Appetite was slow to progress over the last few days but is close to normal today. Consumed 100% of breakfast this am and almost completely finished lunch during RD assessment. Discussed the importance of protein intake to promote post op healing. Pt likes Glucerna and is willing to take three daily.   Pt endorses a UBW of 180 lb and denies recent wt loss. Records indicate pt has maintained her weight over last two months.   UOP: 200 ml x 24 hrs Wound VAC (L): 185 ml x 24 hrs Wound VAC (R): 75 ml x 24 hrs   Medications: colace, SS novolog, senokot Labs: CBG 132-189  NUTRITION - FOCUSED PHYSICAL  EXAM:  Flowsheet Row Most Recent Value  Orbital Region No depletion  Upper Arm Region No depletion  Thoracic and Lumbar Region Unable to assess  Buccal Region No depletion  Temple Region No depletion  Clavicle Bone Region No depletion  Clavicle and Acromion Bone Region No depletion  Scapular Bone Region Unable to assess  Dorsal Hand No depletion  Patellar Region Unable to assess  Anterior Thigh Region Unable to assess  Posterior Calf Region Unable to assess  Edema (RD Assessment) Unable to assess  Hair Reviewed  Eyes Reviewed  Mouth Reviewed  Skin Reviewed  Nails Reviewed     Diet Order:   Diet Order            Diet NPO time specified  Diet effective midnight           Diet heart healthy/carb modified Room service appropriate? Yes; Fluid consistency: Thin  Diet effective now                 EDUCATION NEEDS:   Education needs have been addressed  Skin:  Skin Assessment: Skin Integrity Issues: Skin Integrity Issues:: Incisions,Wound VAC,Stage I,DTI DTI: buttocks Stage I: buttocks Wound Vac: L/R groin Incisions: L leg, R ankle, groin x2  Last BM:  12/29  Height:   Ht Readings from Last 1 Encounters:  06/14/20 5\' 8"  (1.727 m)    Weight:   Wt Readings from Last 1 Encounters:  06/17/20 87.9 kg    BMI:  Body mass index is 29.46 kg/m.  Estimated Nutritional Needs:   Kcal:  2000-2200 kcal  Protein:  100-120 grams  Fluid:  >/= 2 L/day  Vanessa Kick RD, LDN Clinical Nutrition Pager listed in AMION

## 2020-06-21 ENCOUNTER — Encounter (HOSPITAL_COMMUNITY)
Admission: AD | Disposition: A | Discharge status: Hospice | Payer: Self-pay | Source: Home / Self Care | Attending: Vascular Surgery

## 2020-06-21 ENCOUNTER — Inpatient Hospital Stay (HOSPITAL_COMMUNITY): Payer: BC Managed Care – PPO | Admitting: Anesthesiology

## 2020-06-21 ENCOUNTER — Encounter (HOSPITAL_COMMUNITY): Payer: Self-pay | Admitting: Vascular Surgery

## 2020-06-21 HISTORY — PX: APPLICATION OF WOUND VAC: SHX5189

## 2020-06-21 HISTORY — PX: WOUND DEBRIDEMENT: SHX247

## 2020-06-21 LAB — GLUCOSE, CAPILLARY
Glucose-Capillary: 204 mg/dL — ABNORMAL HIGH (ref 70–99)
Glucose-Capillary: 165 mg/dL — ABNORMAL HIGH (ref 70–99)
Glucose-Capillary: 165 mg/dL — ABNORMAL HIGH (ref 70–99)
Glucose-Capillary: 176 mg/dL — ABNORMAL HIGH (ref 70–99)
Glucose-Capillary: 241 mg/dL — ABNORMAL HIGH (ref 70–99)
Glucose-Capillary: 241 mg/dL — ABNORMAL HIGH (ref 70–99)

## 2020-06-21 SURGERY — APPLICATION, WOUND VAC
Anesthesia: General | Laterality: Bilateral

## 2020-06-21 MED ORDER — 0.9 % SODIUM CHLORIDE (POUR BTL) OPTIME
TOPICAL | Status: DC | PRN
  Administered 2020-06-21 (×2): 1000 mL

## 2020-06-21 MED ORDER — LIDOCAINE 2% (20 MG/ML) 5 ML SYRINGE
INTRAMUSCULAR | Status: DC | PRN
  Administered 2020-06-21: 100 mg via INTRAVENOUS

## 2020-06-21 MED ORDER — STERILE WATER FOR IRRIGATION IR SOLN
Status: DC | PRN
  Administered 2020-06-21: 1000 mL

## 2020-06-21 MED ORDER — DEXAMETHASONE SODIUM PHOSPHATE 10 MG/ML IJ SOLN
INTRAMUSCULAR | Status: DC | PRN
  Administered 2020-06-21: 4 mg via INTRAVENOUS

## 2020-06-21 MED ORDER — DEXAMETHASONE SODIUM PHOSPHATE 10 MG/ML IJ SOLN
INTRAMUSCULAR | Status: AC
  Filled 2020-06-21: qty 1

## 2020-06-21 MED ORDER — ONDANSETRON HCL 4 MG/2ML IJ SOLN
INTRAMUSCULAR | Status: AC
  Filled 2020-06-21: qty 2

## 2020-06-21 MED ORDER — ACETAMINOPHEN 10 MG/ML IV SOLN: 1000.0000 mg | Freq: Once | INTRAVENOUS | Status: DC | PRN

## 2020-06-21 MED ORDER — FENTANYL CITRATE (PF) 250 MCG/5ML IJ SOLN
INTRAMUSCULAR | Status: DC | PRN
  Administered 2020-06-21 (×2): 25 ug via INTRAVENOUS

## 2020-06-21 MED ORDER — LACTATED RINGERS IV SOLN: INTRAVENOUS | Status: DC

## 2020-06-21 MED ORDER — OXYCODONE HCL 5 MG/5ML PO SOLN: 5.0000 mg | Freq: Once | ORAL | Status: DC | PRN

## 2020-06-21 MED ORDER — CHLORHEXIDINE GLUCONATE 0.12 % MT SOLN
15.0000 mL | OROMUCOSAL | Status: AC
  Administered 2020-06-21: 15 mL via OROMUCOSAL
  Filled 2020-06-21 (×2): qty 15

## 2020-06-21 MED ORDER — EPHEDRINE 5 MG/ML INJ
INTRAVENOUS | Status: AC
  Filled 2020-06-21: qty 10

## 2020-06-21 MED ORDER — PROPOFOL 10 MG/ML IV BOLUS
INTRAVENOUS | Status: AC
  Filled 2020-06-21: qty 20

## 2020-06-21 MED ORDER — LACTATED RINGERS IV SOLN: INTRAVENOUS | Status: DC | PRN

## 2020-06-21 MED ORDER — FENTANYL CITRATE (PF) 250 MCG/5ML IJ SOLN
INTRAMUSCULAR | Status: AC
  Filled 2020-06-21: qty 5

## 2020-06-21 MED ORDER — LABETALOL HCL 5 MG/ML IV SOLN
20.0000 mg | INTRAVENOUS | Status: DC | PRN
  Administered 2020-06-21 – 2020-07-14 (×7): 20 mg via INTRAVENOUS
  Filled 2020-06-21 (×7): qty 4

## 2020-06-21 MED ORDER — FENTANYL CITRATE (PF) 100 MCG/2ML IJ SOLN: 25.0000 ug | INTRAMUSCULAR | Status: DC | PRN

## 2020-06-21 MED ORDER — ONDANSETRON HCL 4 MG/2ML IJ SOLN
INTRAMUSCULAR | Status: DC | PRN
  Administered 2020-06-21: 4 mg via INTRAVENOUS

## 2020-06-21 MED ORDER — PHENYLEPHRINE HCL-NACL 10-0.9 MG/250ML-% IV SOLN
INTRAVENOUS | Status: DC | PRN
  Administered 2020-06-21: 20 ug/min via INTRAVENOUS

## 2020-06-21 MED ORDER — EPHEDRINE SULFATE-NACL 50-0.9 MG/10ML-% IV SOSY
PREFILLED_SYRINGE | INTRAVENOUS | Status: DC | PRN
  Administered 2020-06-21 (×3): 10 mg via INTRAVENOUS

## 2020-06-21 MED ORDER — PROPOFOL 10 MG/ML IV BOLUS
INTRAVENOUS | Status: DC | PRN
  Administered 2020-06-21: 150 mg via INTRAVENOUS

## 2020-06-21 MED ORDER — BENAZEPRIL HCL 5 MG PO TABS
40.0000 mg | ORAL_TABLET | Freq: Every day | ORAL | Status: DC
Start: 2020-06-21 — End: 2020-07-03
  Administered 2020-06-21 – 2020-07-02 (×12): 40 mg via ORAL
  Filled 2020-06-21 (×12): qty 8

## 2020-06-21 MED ORDER — LIDOCAINE 2% (20 MG/ML) 5 ML SYRINGE
INTRAMUSCULAR | Status: AC
  Filled 2020-06-21: qty 5

## 2020-06-21 MED ORDER — OXYCODONE HCL 5 MG PO TABS: 5.0000 mg | ORAL_TABLET | Freq: Once | ORAL | Status: DC | PRN

## 2020-06-21 MED ORDER — ONDANSETRON HCL 4 MG/2ML IJ SOLN: 4.0000 mg | Freq: Once | INTRAMUSCULAR | Status: DC | PRN

## 2020-06-21 SURGICAL SUPPLY — 38 items
CANISTER SUCT 3000ML PPV (MISCELLANEOUS) ×2 IMPLANT
CANISTER WOUND CARE 500ML ATS (WOUND CARE) IMPLANT
COVER SURGICAL LIGHT HANDLE (MISCELLANEOUS) ×2 IMPLANT
COVER WAND RF STERILE (DRAPES) IMPLANT
DRAPE INCISE IOBAN 66X45 STRL (DRAPES) ×2 IMPLANT
DRAPE ORTHO SPLIT 77X108 STRL (DRAPES) ×1
DRAPE SURG ORHT 6 SPLT 77X108 (DRAPES) ×1 IMPLANT
DRSG VAC ATS LRG SENSATRAC (GAUZE/BANDAGES/DRESSINGS) IMPLANT
DRSG VAC ATS MED SENSATRAC (GAUZE/BANDAGES/DRESSINGS) ×2 IMPLANT
DRSG VAC ATS SM SENSATRAC (GAUZE/BANDAGES/DRESSINGS) ×2 IMPLANT
ELECT REM PT RETURN 9FT ADLT (ELECTROSURGICAL) ×2
ELECTRODE REM PT RTRN 9FT ADLT (ELECTROSURGICAL) ×1 IMPLANT
GAUZE SPONGE 4X4 12PLY STRL (GAUZE/BANDAGES/DRESSINGS) ×2 IMPLANT
GLOVE BIO SURGEON STRL SZ7.5 (GLOVE) ×2 IMPLANT
GLOVE BIOGEL PI IND STRL 8 (GLOVE) ×1 IMPLANT
GLOVE BIOGEL PI INDICATOR 8 (GLOVE) ×1
GLOVE SURG UNDER POLY LF SZ6.5 (GLOVE) ×2 IMPLANT
GOWN STRL REUS W/ TWL LRG LVL3 (GOWN DISPOSABLE) ×3 IMPLANT
GOWN STRL REUS W/ TWL XL LVL3 (GOWN DISPOSABLE) ×1 IMPLANT
GOWN STRL REUS W/TWL LRG LVL3 (GOWN DISPOSABLE) ×6
GOWN STRL REUS W/TWL XL LVL3 (GOWN DISPOSABLE) ×1
KIT BASIN OR (CUSTOM PROCEDURE TRAY) ×2 IMPLANT
KIT TURNOVER KIT B (KITS) ×2 IMPLANT
NS IRRIG 1000ML POUR BTL (IV SOLUTION) ×4 IMPLANT
PACK GENERAL/GYN (CUSTOM PROCEDURE TRAY) ×2 IMPLANT
PACK UNIVERSAL I (CUSTOM PROCEDURE TRAY) ×2 IMPLANT
PAD ARMBOARD 7.5X6 YLW CONV (MISCELLANEOUS) ×4 IMPLANT
STAPLER VISISTAT 35W (STAPLE) IMPLANT
SUT ETHILON 2 0 PSLX (SUTURE) IMPLANT
SUT ETHILON 3 0 PS 1 (SUTURE) IMPLANT
SUT MNCRL AB 4-0 PS2 18 (SUTURE) IMPLANT
SUT SILK 3 0 (SUTURE) ×2
SUT SILK 3-0 18XBRD TIE 12 (SUTURE) ×1 IMPLANT
SUT VIC AB 2-0 CTB1 (SUTURE) IMPLANT
SUT VIC AB 3-0 SH 27 (SUTURE) ×4
SUT VIC AB 3-0 SH 27X BRD (SUTURE) ×2 IMPLANT
TOWEL GREEN STERILE (TOWEL DISPOSABLE) ×2 IMPLANT
WATER STERILE IRR 1000ML POUR (IV SOLUTION) ×2 IMPLANT

## 2020-06-21 NOTE — Progress Notes (Signed)
PT Cancellation Note  Patient Details Name: Holly Stevenson MRN: [id number] DOB: 02-28-1951   Cancelled Treatment:    Reason Eval/Treat Not Completed: (P) Patient at procedure or test/unavailable (To OR for washout, will defer PT at this time.)   Florestine Avers 06/21/2020, 2:12 PM  Bonney Leitz , PTA Acute Rehabilitation Services Pager [phone] Office [phone]

## 2020-06-21 NOTE — Progress Notes (Signed)
Discussed plan for bilateral groin washout, possible debridement, and vac change.  Cephus Shelling, MD [facility] [place] Office: [phone]   Cephus Shelling

## 2020-06-21 NOTE — Progress Notes (Signed)
Inpatient Rehab Admissions Coordinator Note:   At this time we are recommending a CIR consult.  If pt would like to be considered please place an IP Rehab MD consult order.  Contact me with questions.   Estill Dooms, PT, DPT [phone] 06/21/20 11:54 AM

## 2020-06-21 NOTE — Discharge Summary (Signed)
[facility] Discharge Summary   Patient ID:  Holly Stevenson MRN: [id number] DOB/AGE: [birth year] 70 y.o.  Admit date: 06/01/2020 Discharge date: 06/21/2020 Date of Surgery: 06/01/2020 - 06/06/2020 Surgeon: Moishe Spice): Leslye Peer, MD  Admission Diagnosis: PAD (peripheral artery disease) (HCC) [I73.9]  Discharge Diagnoses:  PAD (peripheral artery disease) (HCC) [I73.9]  Secondary Diagnoses: Past Medical History:  Diagnosis Date  . Diabetes mellitus without complication (HCC)    Type II  . High cholesterol   . Hypertension   . Pneumonia     Procedure(s): VIDEO BRONCHOSCOPY WITH ENDOBRONCHIAL NAVIGATION BRONCHIAL BRUSHINGS BRONCHIAL BIOPSIES BRONCHIAL WASHINGS BRONCHIAL NEEDLE ASPIRATION BIOPSIES  Discharged Condition: good  HPI:  Holly Stevenson is a 70 y.o. year old female who presents for postoperative follow-up for: right iliofemoral endarterectomy and bovine patch angioplasty with R to L femorofemoral bypass and L femoral to AK popliteal bypass with PTFE by Dr. Lenell Antu on 05/23/20.  She was last seen in office on 05/26/20 to evaluate bloody drainage from R groin incision however she had intact peripheral doppler signals of bilateral lower extremities at the time.  She returns today with numbness of LLE and increase in pain in L foot over the past 24 hours.  She also states bloody drainage has increased from R groin incision.  She is taking aspirin and statin daily.  She is ambulatory with a walker.  She denies rest pain R foot.  Hospital Course:  Holly Stevenson is a 70 y.o. female is S/P  Procedure(s): PROCEDURE:   status post thrombectomy of her right left femorofemoral with revision of the right groin anastomosis and also thrombectomy of her left femoropopliteal bypass   VIDEO BRONCHOSCOPY WITH ENDOBRONCHIAL NAVIGATION BRONCHIAL BRUSHINGS BRONCHIAL BIOPSIES BRONCHIAL WASHINGS BRONCHIAL NEEDLE ASPIRATION BIOPSIES  left  DP/peroneal and PT  Hemoglobin 8.8 --> 8.0.  Ultimately she is scheduled for bronchoscopy with Dr.Byrum Outpatient followup will be withDr. Delton Coombes. will transition to Eliquis 5mg  PO BID Right groin prevenna vac in place Discharge home with B LE well perfused   Consults:  Treatment Team:  , MD  Significant Diagnostic Studies: CBC Lab Results  Component Value Date   WBC 14.0 (H) 06/18/2020   HGB 8.5 (L) 06/18/2020   HCT 26.3 (L) 06/18/2020   MCV 86.5 06/18/2020   PLT 77 (L) 06/18/2020    BMET    Component Value Date/Time   NA 136 06/17/2020 0433   K 3.6 06/17/2020 0433   CL 107 06/17/2020 0433   CO2 17 (L) 06/17/2020 0433   GLUCOSE 189 (H) 06/17/2020 0433   BUN 16 06/17/2020 0433   CREATININE 0.94 06/17/2020 0433   CALCIUM 7.4 (L) 06/17/2020 0433   GFRNONAA >60 06/17/2020 0433   GFRAA 52 (L) 02/18/2018 1610   COAG Lab Results  Component Value Date   INR 1.0 05/03/2020   INR 1.0 04/25/2020     Disposition:  Discharge to :Home  Allergies as of 06/07/2020   No Known Allergies     Medication List    STOP taking these medications   oxyCODONE 5 MG immediate release tablet Commonly known as: Oxy IR/ROXICODONE     TAKE these medications   acetaminophen 500 MG tablet Commonly known as: TYLENOL Take 1,500 mg by mouth 3 (three) times daily as needed for moderate pain or headache.   apixaban 5 MG Tabs tablet Commonly known as: ELIQUIS Take 1 tablet (5 mg total) by mouth 2 (two) times daily.   aspirin 81  MG EC tablet Take 1 tablet (81 mg total) by mouth daily at 6 (six) AM. Swallow whole.   atorvastatin 40 MG tablet Commonly known as: LIPITOR Take 1 tablet (40 mg total) by mouth daily.   benazepril 10 MG tablet Commonly known as: LOTENSIN Take 10 mg by mouth daily.   cholecalciferol 25 MCG (1000 UNIT) tablet Commonly known as: VITAMIN D3 Take 1,000 Units by mouth daily.   feeding supplement (GLUCERNA SHAKE) Liqd Take 237 mLs by  mouth 3 (three) times daily between meals.   Jardiance 25 MG Tabs tablet Generic drug: empagliflozin Take 25 mg by mouth daily.   metFORMIN 500 MG tablet Commonly known as: GLUCOPHAGE Take 1,000 mg by mouth 2 (two) times daily.   multivitamin with minerals tablet Take 1 tablet by mouth daily.   oxyCODONE-acetaminophen 5-325 MG tablet Commonly known as: PERCOCET/ROXICET Take 1 tablet by mouth every 6 (six) hours as needed for moderate pain.   Spiriva Respimat 1.25 MCG/ACT Aers Generic drug: Tiotropium Bromide Monohydrate Inhale 2 puffs into the lungs daily.      Verbal and written Discharge instructions given to the patient. Wound care per Discharge AVS  Follow-up Information    Leonie Douglas, MD Follow up in 1 week(s).   Specialties: Vascular Surgery, Interventional Cardiology Why: office will call Contact information: [address] [place] [address] [phone]               Signed: Mosetta Pigeon 06/21/2020, 8:21 AM

## 2020-06-21 NOTE — Transfer of Care (Signed)
Immediate Anesthesia Transfer of Care Note  Patient: Holly Stevenson  Procedure(s) Performed: WOUND VAC CHANGE (Bilateral ) DEBRIDEMENT FOR GROIN WOUNDS (Bilateral )  Patient Location: PACU  Anesthesia Type:General  Level of Consciousness: awake, alert  and patient cooperative  Airway & Oxygen Therapy: Patient Spontanous Breathing and Patient connected to face mask oxygen  Post-op Assessment: Report given to RN and Post -op Vital signs reviewed and stable  Post vital signs: Reviewed and stable  Last Vitals:  Vitals Value Taken Time  BP    Temp    Pulse 74 06/21/20 1559  Resp 19 06/21/20 1559  SpO2 93 % 06/21/20 1559  Vitals shown include unvalidated device data.  Last Pain:  Vitals:   06/21/20 1158  TempSrc: Oral  PainSc:       Patients Stated Pain Goal: 2 (06/17/20 0405)  Complications: No complications documented.

## 2020-06-21 NOTE — Plan of Care (Signed)

## 2020-06-21 NOTE — Progress Notes (Signed)
Patient back to room 4E04 from PACU. Wound vac placed to bilateral groins clean dry and intact.  Alert and oriented to room and call light. Call bell within reach.

## 2020-06-21 NOTE — OR Nursing (Signed)
Pt is awake,alert and oriented.Pt and/or family verbalized understanding of poc and discharge instructions. Reviewed admission and on going care with receiving RN. Pt is in NAD at this time and is ready to be transferred to floor. Will con't to monitor until pt is transferred. Belongings on bed with patient Pt on Monitor  

## 2020-06-21 NOTE — Anesthesia Preprocedure Evaluation (Signed)
Anesthesia Evaluation  Patient identified by MRN, date of birth, ID band Patient awake    Reviewed: Allergy & Precautions, H&P , NPO status , Patient's Chart, lab work & pertinent test results  Airway Mallampati: II  TM Distance: >3 FB Neck ROM: Full    Dental no notable dental hx.    Pulmonary neg pulmonary ROS, Current Smoker,    Pulmonary exam normal breath sounds clear to auscultation       Cardiovascular hypertension, + Peripheral Vascular Disease  Normal cardiovascular exam Rhythm:Regular Rate:Normal     Neuro/Psych negative neurological ROS  negative psych ROS   GI/Hepatic negative GI ROS, Neg liver ROS,   Endo/Other  diabetes, Type 2  Renal/GU Renal InsufficiencyRenal disease  negative genitourinary   Musculoskeletal negative musculoskeletal ROS (+)   Abdominal   Peds negative pediatric ROS (+)  Hematology negative hematology ROS (+)   Anesthesia Other Findings   Reproductive/Obstetrics negative OB ROS                             Anesthesia Physical Anesthesia Plan  ASA: III  Anesthesia Plan: General   Post-op Pain Management:    Induction: Intravenous  PONV Risk Score and Plan: 2 and Ondansetron, Dexamethasone and Treatment may vary due to age or medical condition  Airway Management Planned: LMA  Additional Equipment:   Intra-op Plan:   Post-operative Plan: Extubation in OR  Informed Consent: I have reviewed the patients History and Physical, chart, labs and discussed the procedure including the risks, benefits and alternatives for the proposed anesthesia with the patient or authorized representative who has indicated his/her understanding and acceptance.     Dental advisory given  Plan Discussed with: CRNA and Surgeon  Anesthesia Plan Comments:         Anesthesia Quick Evaluation

## 2020-06-21 NOTE — Progress Notes (Signed)
Labetalol 10mg given

## 2020-06-21 NOTE — Anesthesia Procedure Notes (Signed)
Procedure Name: LMA Insertion Date/Time: 06/21/2020 2:51 PM Performed by: Carlos American, CRNA Pre-anesthesia Checklist: Patient identified, Emergency Drugs available, Suction available and Patient being monitored Patient Re-evaluated:Patient Re-evaluated prior to induction Oxygen Delivery Method: Circle System Utilized Preoxygenation: Pre-oxygenation with 100% oxygen Induction Type: IV induction Ventilation: Mask ventilation without difficulty LMA: LMA inserted LMA Size: 4.0 Number of attempts: 1 Placement Confirmation: positive ETCO2 Tube secured with: Tape Dental Injury: Teeth and Oropharynx as per pre-operative assessment

## 2020-06-21 NOTE — Op Note (Signed)
Date: June 21, 2020  Preoperative diagnosis: Bilateral groin wounds  Postoperative diagnosis: Same  Procedure: Washout of bilateral groin wounds with sharp excisional debridement and negative pressure wound VAC placement (right groin measures 11 cm x 2.5 cm x 3 cm and left groin measures 8 cm x 2.5 cm x 4 cm)  Surgeon: Dr. 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  She presents today for groin washout and VAC change after risks and benefits discussed.  Assistant was needed for exposure and to expedite the case.  Findings: The medial edge of the right groin sartorius flap did require some debridement but overall the wound looked viable.  Some of the subcutaneous tissue in the left groin wound also required debridement but again the wound overall looked viable and we were able to reapproximate some soft tissue coverage over top of the femoral artery and cryobypass.  Black vac sponges were placed in both groins.  Anesthesia: General  Details: Patient was taken to the operating room after informed consent was obtained.  Placed on the operating table in supine position.  Anesthesia was induced and vacs were removed from both groins.  Both groins were then prepped and draped in usual sterile fashion.  A timeout was performed to identify patient, procedure and site.  Initially focused our attention on the right groin that we copiously irrigated with saline until the fluid was clear.  I did have to debride some of the medial edge of the sartorius muscle flap where it was necrotic.  I then used some 3-0 Vicryl to reapproximate additional muscle to the inguinal ligament in order to get adequate coverage over  the artery.  Ultimately we then turned our attention to the left groin and some of the subcutaneous tissue that had been approximated over the artery was frankly necrotic and had to be again debrided with sharp excisional debridement using Metzenbaum scissors.  I was able to reapproximate some deeper subcutaneous tissue over the artery and ccryo bypass with 3-0 Vicryl's.  We put a small sponge in the left groin and a medium black sponge in the right groin.  Ioban and track pads were applied.  We had good seal and she was taken holding in stable condition.  Complications: None  Condition: Stable  Cephus Shelling, MD [facility] Office: [phone]   Cephus Shelling

## 2020-06-21 NOTE — OR Nursing (Signed)
Left groin wound measures 8 x 2.5 x 4 cm. Right groin wound measures 11 x 2.5 x 3 cm.

## 2020-06-21 NOTE — Progress Notes (Signed)
LATE ENTRY.3mg  of Morphine 1mg /ml PCA injection wasted by RN and RN.

## 2020-06-22 ENCOUNTER — Encounter (HOSPITAL_COMMUNITY): Payer: Self-pay | Admitting: Vascular Surgery

## 2020-06-22 LAB — GLUCOSE, CAPILLARY
Glucose-Capillary: 225 mg/dL — ABNORMAL HIGH (ref 70–99)
Glucose-Capillary: 191 mg/dL — ABNORMAL HIGH (ref 70–99)
Glucose-Capillary: 209 mg/dL — ABNORMAL HIGH (ref 70–99)
Glucose-Capillary: 177 mg/dL — ABNORMAL HIGH (ref 70–99)

## 2020-06-22 MED ORDER — PANTOPRAZOLE SODIUM 40 MG PO TBEC
40.0000 mg | DELAYED_RELEASE_TABLET | Freq: Every day | ORAL | Status: DC
  Administered 2020-06-22 – 2020-07-02 (×11): 40 mg via ORAL
  Filled 2020-06-22 (×11): qty 1

## 2020-06-22 NOTE — PMR Pre-admission (Shared)
PMR Admission Coordinator Pre-Admission Assessment  Patient: Holly Stevenson is an 70 y.o., female MRN: [id number] DOB: 1951-03-14 Height: 5' 8"  (172.7 cm) Weight: 87.9 kg  Insurance Information HMO: ***    PPO: ***     PCP: ***     IPA: ***     80/20: ***     OTHER: *** PRIMARY: ***      Policy#: ***      Subscriber: *** CM Name: ***      Phone#: ***     Fax#: *** Pre-Cert#: ***      Employer: *** Benefits:  Phone #: ***     Name: *** Eff. Date: ***     Deduct: ***      Out of Pocket Max: ***      Life Max: *** CIR: ***      SNF: *** Outpatient: ***     Co-Pay: *** Home Health: ***      Co-Pay: *** DME: ***     Co-Pay: *** Providers: *** SECONDARY: ***      Policy#: ***     Phone#: ***  Financial Counselor: ***      Phone#: ***  The "Data Collection Information Summary" for patients in Inpatient Rehabilitation Facilities with attached "Privacy Act [place] Records" was provided and verbally reviewed with: {CHL IP Patient Family JJ:[id number]}  Emergency Contact Information Contact Information    Name Relation Home Work Mobile   Sakai, Heinle Daughter   [phone]      Current Medical History  Patient Admitting Diagnosis: L AKA  History of Present Illness: ***    Patient's medical record from [facility] has been reviewed by the rehabilitation admission coordinator and physician.  Past Medical History  Past Medical History:  Diagnosis Date  . Diabetes mellitus without complication ([place])    Type II  . High cholesterol   . Hypertension   . Pneumonia     Family History   family history is not on file.  Prior Rehab/Hospitalizations Has the patient had prior rehab or hospitalizations prior to admission? Yes  Has the patient had major surgery during 100 days prior to admission? Yes   Current Medications  Current Facility-Administered Medications:  .  (feeding supplement) PROSource Plus liquid 30 mL, 30 mL, Oral, BID BM,  Cherre Robins, MD, 30 mL at 06/22/20 1605 .  0.9 %  sodium chloride infusion, 500 mL, Intravenous, Once PRN, Setzer, Edman Circle, PA-C .  0.9 %  sodium chloride infusion, 250 mL, Intravenous, Continuous, Setzer, Edman Circle, PA-C .  acetaminophen (TYLENOL) tablet 325-650 mg, 325-650 mg, Oral, Q4H PRN, 650 mg at 06/20/20 5573 **OR** acetaminophen (TYLENOL) suppository 325-650 mg, 325-650 mg, Rectal, Q4H PRN, Setzer, Edman Circle, PA-C .  acetaminophen (TYLENOL) tablet 650 mg, 650 mg, Oral, Q6H, Setzer, Edman Circle, PA-C, 650 mg at 06/22/20 1304 .  alum & mag hydroxide-simeth (MAALOX/MYLANTA) 200-200-20 MG/5ML suspension 15-30 mL, 15-30 mL, Oral, Q2H PRN, Setzer, Edman Circle, PA-C .  ampicillin (OMNIPEN) 2 g in sodium chloride 0.9 % 100 mL IVPB, 2 g, Intravenous, Q6H, Cherre Robins, MD, Last Rate: 300 mL/hr at 06/22/20 1304, 2 g at 06/22/20 1304 .  aspirin chewable tablet 81 mg, 81 mg, Oral, Daily, Barbie Banner, PA-C, 81 mg at 06/22/20 2202 .  atorvastatin (LIPITOR) tablet 40 mg, 40 mg, Oral, Daily, Barbie Banner, PA-C, 40 mg at 06/22/20 0934 .  benazepril (LOTENSIN) tablet 40 mg, 40 mg, Oral, Daily, Jamelle Haring  N, MD, 40 mg at 06/22/20 0954 .  bisacodyl (DULCOLAX) suppository 10 mg, 10 mg, Rectal, Daily PRN, Setzer, Edman Circle, PA-C .  Chlorhexidine Gluconate Cloth 2 % PADS 6 each, 6 each, Topical, Daily, Barbie Banner, PA-C, 6 each at 06/21/20 0802 .  cholecalciferol (VITAMIN D3) tablet 1,000 Units, 1,000 Units, Oral, Daily, Barbie Banner, PA-C, 1,000 Units at 06/22/20 0941 .  diphenhydrAMINE (BENADRYL) injection 12.5 mg, 12.5 mg, Intravenous, Q6H PRN **OR** diphenhydrAMINE (BENADRYL) 12.5 MG/5ML elixir 12.5 mg, 12.5 mg, Oral, Q6H PRN, Setzer, Edman Circle, PA-C .  docusate (COLACE) 50 MG/5ML liquid 100 mg, 100 mg, Oral, BID, Setzer, Edman Circle, PA-C, 100 mg at 06/22/20 0941 .  feeding supplement (GLUCERNA SHAKE) (GLUCERNA SHAKE) liquid 237 mL, 237 mL, Oral, TID BM, Cherre Robins, MD, 237 mL at  06/22/20 1606 .  guaiFENesin-dextromethorphan (ROBITUSSIN DM) 100-10 MG/5ML syrup 15 mL, 15 mL, Oral, Q4H PRN, Setzer, Edman Circle, PA-C .  heparin injection 5,000 Units, 5,000 Units, Subcutaneous, Q8H, Dagoberto Ligas, PA-C, 5,000 Units at 06/22/20 1606 .  hydrALAZINE (APRESOLINE) injection 10-40 mg, 10-40 mg, Intravenous, Q4H PRN, Barbie Banner, PA-C, 40 mg at 06/21/20 1201 .  insulin aspart (novoLOG) injection 0-9 Units, 0-9 Units, Subcutaneous, TID WC, Setzer, Edman Circle, PA-C, 2 Units at 06/22/20 1305 .  ipratropium-albuterol (DUONEB) 0.5-2.5 (3) MG/3ML nebulizer solution 3 mL, 3 mL, Nebulization, Q6H PRN, Setzer, Edman Circle, PA-C .  labetalol (NORMODYNE) injection 20 mg, 20 mg, Intravenous, Q2H PRN, Cherre Robins, MD, 20 mg at 06/22/20 0054 .  lactated ringers infusion, , Intravenous, Continuous, Myrtie Soman, MD, Last Rate: 10 mL/hr at 06/21/20 1431, New Bag at 06/21/20 1431 .  magnesium sulfate IVPB 2 g 50 mL, 2 g, Intravenous, Daily PRN, Setzer, Edman Circle, PA-C .  metoprolol tartrate (LOPRESSOR) injection 2-5 mg, 2-5 mg, Intravenous, Q2H PRN, Setzer, Edman Circle, PA-C .  morphine 1 mg/mL PCA injection, , Intravenous, Q4H, Setzer, Edman Circle, PA-C, 4.39 mg at 06/22/20 0220 .  multivitamin with minerals tablet 1 tablet, 1 tablet, Oral, Daily, Setzer, Edman Circle, PA-C, 1 tablet at 06/22/20 0942 .  naloxone [facility]) injection 0.4 mg, 0.4 mg, Intravenous, PRN **AND** sodium chloride flush (NS) 0.9 % injection 9 mL, 9 mL, Intravenous, PRN, Setzer, Edman Circle, PA-C .  OLANZapine (ZYPREXA) tablet 2.5 mg, 2.5 mg, Oral, QHS, Setzer, Edman Circle, PA-C, 2.5 mg at 06/21/20 2204 .  ondansetron (ZOFRAN) injection 4 mg, 4 mg, Intravenous, Q6H PRN, Setzer, Edman Circle, PA-C .  pantoprazole (PROTONIX) EC tablet 40 mg, 40 mg, Oral, Daily, Cherre Robins, MD, 40 mg at 06/22/20 0942 .  phenol (CHLORASEPTIC) mouth spray 1 spray, 1 spray, Mouth/Throat, PRN, Setzer, Edman Circle, PA-C .  polyethylene glycol (MIRALAX / GLYCOLAX)  packet 17 g, 17 g, Oral, Daily, Setzer, Edman Circle, PA-C, 17 g at 06/20/20 0827 .  senna-docusate (Senokot-S) tablet 1 tablet, 1 tablet, Oral, BID, Dagoberto Ligas, PA-C, 1 tablet at 06/20/20 2107 .  zolpidem (AMBIEN) tablet 5 mg, 5 mg, Oral, QHS PRN, Barbie Banner, PA-C, 5 mg at 06/20/20 2107  Patients Current Diet:  Diet Order            Diet heart healthy/carb modified Room service appropriate? Yes; Fluid consistency: Thin  Diet effective now                 Precautions / Restrictions Precautions Precautions: Fall Precaution Comments: bil groin VAC Restrictions Weight Bearing Restrictions: Yes LLE Weight Bearing: Non weight bearing  Has the patient had 2 or more falls or a fall with injury in the past year? No  Prior Activity Level Community (5-7x/wk): active, independent PTA, worked as full time Scientist, water quality at a bank  Prior Functional Level Self Care: Did the patient need help bathing, dressing, using the toilet or eating? Independent  Indoor Mobility: Did the patient need assistance with walking from room to room (with or without device)? Independent  Stairs: Did the patient need assistance with internal or external stairs (with or without device)? Independent  Functional Cognition: Did the patient need help planning regular tasks such as shopping or remembering to take medications? Independent  Home Assistive Devices / Equipment Home Assistive Devices/Equipment: Environmental consultant (specify type),Eyeglasses Home Equipment: Walker - standard,Shower seat,Walker - 2 wheels  Prior Device Use: Indicate devices/aids used by the patient prior to current illness, exacerbation or injury? None of the above  Current Functional Level Cognition  Overall Cognitive Status: Within Functional Limits for tasks assessed Orientation Level: Oriented X4 General Comments: A&Ox4.    Extremity Assessment (includes Sensation/Coordination)  Upper Extremity Assessment: Generalized weakness   Lower Extremity Assessment: Defer to PT evaluation RLE Deficits / Details: grossly 3/5 LLE Deficits / Details: hip ROM 3/5 limited function of knee and no movement of ankle or toes with noted cold to touch and lack of circulation    ADLs  Overall ADL's : Needs assistance/impaired Eating/Feeding: Independent,Sitting Grooming: Wash/dry hands,Wash/dry face,Sitting,Oral care,Set up Grooming Details (indicate cue type and reason): set on BSC Upper Body Bathing: Sitting,Min guard Lower Body Bathing: Maximal assistance,Sitting/lateral leans Upper Body Dressing : Sitting,Min guard Lower Body Dressing: Maximal assistance,Sitting/lateral leans Toilet Transfer: Maximal assistance,Stand-pivot,Cueing for safety,Cueing for sequencing Toilet Transfer Details (indicate cue type and reason): max A in order to stand pivot, pt pre-emptively sat, requiring increased assist from therapist's in order to complete Toileting- Clothing Manipulation and Hygiene: Maximal assistance,Sitting/lateral lean Functional mobility during ADLs: Maximal assistance,+2 for physical assistance,+2 for safety/equipment General ADL Comments: max A stand pivot and use of stedy to the recliner due to deconditioning and need to navigate around with soley RLE    Mobility  Overal bed mobility: Needs Assistance Bed Mobility: Supine to Sit,Rolling Rolling: Min assist (for pericare and pad placement.) Supine to sit: Mod assist,+2 for physical assistance Sit to supine: Mod assist General bed mobility comments: assistance to trunk elevation and scooting hips to edge of bed.    Transfers  Overall transfer level: Needs assistance Equipment used: 2 person hand held assist,Ambulation equipment used (Performed pivot from bed to commode +2 assistance HHA, performed all additional trial with sara stedy.) Transfers: Sit to/from [employer] Sit to Stand: Max assist,+2 physical assistance Squat pivot transfers: Total assist,+2  physical assistance  Lateral/Scoot Transfers: Mod assist General transfer comment: Pt performed squat pivot from bed to commode with intially in standing which turned quickly to a squat pivot as she began to sit before pivot completed.  Pt Performed additional sit to standing transfers in [place] stedy with max +2  assistance.    Ambulation / Gait / Stairs / Wheelchair Mobility  Ambulation/Gait General Gait Details: unable    Posture / Balance Dynamic Sitting Balance Sitting balance - Comments: long sitting in bed. Balance Overall balance assessment: Needs assistance Sitting-balance support: Feet supported,Bilateral upper extremity supported Sitting balance-Leahy Scale: Poor Sitting balance - Comments: long sitting in bed. Standing balance support: Bilateral upper extremity supported Standing balance-Leahy Scale: Poor    Special needs/care consideration Continuous Drip IV  ***, Oxygen ***,  Special Bed ***, Wound Vac ***, Skin *** and Diabetic management ***   Previous Home Environment (from acute therapy documentation) Living Arrangements: Alone Available Help at Discharge: Family Type of Home: House Home Layout: Two level Alternate Level Stairs-Rails: Can reach both Alternate Level Stairs-Number of Steps: flight Home Access: Stairs to enter Entrance Stairs-Rails: Can reach both Entrance Stairs-Number of Steps: flight Bathroom Shower/Tub: Tub/shower unit,Tub only,Walk-in shower Bathroom Toilet: Standard Bathroom Accessibility: No Home Care Services: No Additional Comments: Patient stating she will probably have to sell her house and move in with her daughter after rehab, and once she buys a new house.  Daughter's home has 3 STE and one level.  Tub/shower.  Discharge Living Setting Plans for Discharge Living Setting: Other (Comment) (will go to her daughter's house) Type of Home at Discharge: House Discharge Home Layout: One level Discharge Home Access: Stairs to enter Entrance  Stairs-Rails: None Entrance Stairs-Number of Steps: 4 Discharge Bathroom Shower/Tub: Tub/shower unit Discharge Bathroom Toilet: Standard Discharge Bathroom Accessibility: Yes How Accessible: Accessible via walker Does the patient have any problems obtaining your medications?: No  Social/Family/Support Systems Patient Roles:  (works full time) Sport and exercise psychologist Information: daughter: Lenna Sciara [phone] Anticipated Caregiver: daughter: Lenna Sciara + Melissa's husband is home 24/7 Anticipated Caregiver's Contact Information: see above Ability/Limitations of Caregiver: Min A Caregiver Availability: 24/7 Discharge Plan Discussed with Primary Caregiver: Yes Is Caregiver In Agreement with Plan?: Yes Does Caregiver/Family have Issues with Lodging/Transportation while Pt is in Rehab?: No  Goals Patient/Family Goal for Rehab: PT/OT: Min A; SLP: NA Expected length of stay: 12-16 days Pt/Family Agrees to Admission and willing to participate: Yes Program Orientation Provided & Reviewed with Pt/Caregiver Including Roles  & Responsibilities: Yes  Barriers to Discharge: Home environment access/layout,Wound Care,Weight bearing restrictions  Barriers to Discharge Comments: bilateral wound vacs, new amputation.  Decrease burden of Care through IP rehab admission: Other NA  Possible need for SNF placement upon discharge: Not anticipated; plan is for pt to go stay with her daughter in a more assessable house (they are open to ramp) and she will have 24/7 Min A. Anticipate pt can achieve a Min A level after a CIR stay.   Patient Condition: I have reviewed medical records from [facility], spoken with ***, and patient and daughter. I met with patient at the bedside for inpatient rehabilitation assessment.  Patient will benefit from ongoing PT and OT, can actively participate in 3 hours of therapy a day 5 days of the week, and can make measurable gains during the admission.  Patient will also benefit  from the coordinated team approach during an Inpatient Acute Rehabilitation admission.  The patient will receive intensive therapy as well as Rehabilitation physician, nursing, social worker, and care management interventions.  Due to safety, skin/wound care, disease management, medication administration, pain management and patient education the patient requires 24 hour a day rehabilitation nursing.  The patient is currently *** with mobility and basic ADLs.  Discharge setting and therapy post discharge at home with home health is anticipated.  Patient has agreed to participate in the Acute Inpatient Rehabilitation Program and will admit ***.  Preadmission Screen Completed By:  Raechel Ache, 06/22/2020 5:34 PM ______________________________________________________________________   Discussed status with Dr. Marland Kitchen on *** at *** and received approval for admission today.  Admission Coordinator:  Raechel Ache, OT, time ***Sudie Grumbling ***   Assessment/Plan: Diagnosis: 1. Does the need for close, 24 hr/day Medical supervision in concert with the patient's rehab needs make it unreasonable  for this patient to be served in a less intensive setting? {yes_no_potentially:3041433} 2. Co-Morbidities requiring supervision/potential complications: *** 3. Due to {due UY:0415930}, does the patient require 24 hr/day rehab nursing? {yes_no_potentially:3041433} 4. Does the patient require coordinated care of a physician, rehab nurse, PT, OT, and SLP to address physical and functional deficits in the context of the above medical diagnosis(es)? {yes_no_potentially:3041433} Addressing deficits in the following areas: {deficits:3041436} 5. Can the patient actively participate in an intensive therapy program of at least 3 hrs of therapy 5 days a week? {yes_no_potentially:3041433} 6. The potential for patient to make measurable gains while on inpatient rehab is {potential:3041437} 7. Anticipated functional outcomes upon discharge  from inpatient rehab: {functional outcomes:304600100} PT, {functional outcomes:304600100} OT, {functional outcomes:304600100} SLP 8. Estimated rehab length of stay to reach the above functional goals is: *** 9. Anticipated discharge destination: {anticipated dc setting:21604} 10. Overall Rehab/Functional Prognosis: {potential:3041437}   MD Signature: ***

## 2020-06-22 NOTE — Progress Notes (Addendum)
  Progress Note    06/22/2020 7:34 AM 1 Day Post-Op  Subjective:  No complaints overnight   Vitals:   06/22/20 0409 06/22/20 0543  BP: (!) 151/47   Pulse: 70   Resp: 16 16  Temp: 98.4 F (36.9 C) 98.7 F (37.1 C)  SpO2: 95%    Physical Exam: Lungs:  Non labored Incisions:  Groin incisions with vac, good seal; RLE vein harvest incisions with some serous drainage but appear to be healing well; L AKA incision c/d/i Extremities:  R DP and PT signal by doppler Neurologic: A&O  CBC    Component Value Date/Time   WBC 14.0 (H) 06/18/2020 0152   RBC 3.04 (L) 06/18/2020 0152   HGB 8.5 (L) 06/18/2020 0152   HCT 26.3 (L) 06/18/2020 0152   PLT 77 (L) 06/18/2020 0152   MCV 86.5 06/18/2020 0152   MCH 28.0 06/18/2020 0152   MCHC 32.3 06/18/2020 0152   RDW 17.0 (H) 06/18/2020 0152   LYMPHSABS 1.3 04/25/2020 0345   MONOABS 1.1 (H) 04/25/2020 0345   EOSABS 0.1 04/25/2020 0345   BASOSABS 0.0 04/25/2020 0345    BMET    Component Value Date/Time   NA 136 06/17/2020 0433   K 3.6 06/17/2020 0433   CL 107 06/17/2020 0433   CO2 17 (L) 06/17/2020 0433   GLUCOSE 189 (H) 06/17/2020 0433   BUN 16 06/17/2020 0433   CREATININE 0.94 06/17/2020 0433   CALCIUM 7.4 (L) 06/17/2020 0433   GFRNONAA >60 06/17/2020 0433   GFRAA 52 (L) 02/18/2018 1610    INR    Component Value Date/Time   INR 1.0 05/03/2020 1530     Intake/Output Summary (Last 24 hours) at 06/22/2020 0734 Last data filed at 06/22/2020 0542 Gross per 24 hour  Intake 900 ml  Output 460 ml  Net 440 ml     Assessment/Plan:  70 y.o. female is s/p excsision of infected graft material and subsequent L AKA 1 Day Post-Op   S/p groin debridement and vac change; muscle flap R groin viable; continue vac changes on MWF schedule Encouraged nutrition; patient believes she is eating well; supplement with glucerna shakes Continue work with PT/OT; CIR consulted   Emilie Rutter, PA-C [facility] [phone] 06/22/2020 7:34 AM  VASCULAR STAFF ADDENDUM: I have independently interviewed and examined the patient. I agree with the above.  Working with PT on my evaluation. Continue to work on mobility and nutrition.  VAC change at bedside Friday.  Rande Brunt. Lenell Antu, MD [facility] Phone Number: [phone] 06/22/2020 12:09 PM

## 2020-06-22 NOTE — Progress Notes (Signed)
Inpatient Rehabilitation-Admissions Coordinator   Met with pt bedside for CIR assessment. Pt appears to be a good candidate for CIR and shows an interest in the program. Pt's house is not wc accessible (14 steps to enter) so her plan is to stay with her daughter at DC. With her permission I confirmed DC support with her daughter. Both the pt and her daughter would like to discuss her options for rehab (CIR vs SNF) tonight and get back to me. Will follow up with pt tomorrow for venue preference.   Will follow closely.   Raechel Ache, OTR/L  Rehab Admissions Coordinator  [phone] 06/22/2020 2:31 PM

## 2020-06-22 NOTE — Progress Notes (Signed)
Physical Therapy Treatment Patient Details Name: Holly Stevenson MRN: [id number] DOB: April 17, 1951 Today's Date: 06/22/2020    History of Present Illness 70 yo admitted 12/29 with right groin dehiscence s/p debridement and graft of fem fem BPG. Additional surgeries 12/30 and 12/31 for endarterectomy and thrombolectomy with resultant LLE BPG occlusion and Rt abdominal hematoma. 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    PT Comments    Pt supine in bed on arrival this session.  Pt eager and motivated to move oob to recliner today.  Pt voiced need to urniate so transferred from bed to commode.  Pt sits impulsively during pivot and required max/total assistance to move safely onto commode.  Utilized sara stedy for additional sit to stand transfers.  Informed MD during session about sore on sacrum and need for air mattress.  Continue to recommend aggressive rehab in CIR setting to maximize functional gains and return home at [facility] level.     Follow Up Recommendations  CIR;Supervision/Assistance - 24 hour     Equipment Recommendations  Wheelchair (measurements PT);Wheelchair cushion (measurements PT);3in1 (PT);Hospital bed    Recommendations for Other Services       Precautions / Restrictions Precautions Precautions: Fall Precaution Comments: bil groin VAC Restrictions Weight Bearing Restrictions: Yes LLE Weight Bearing: Non weight bearing    Mobility  Bed Mobility Overal bed mobility: Needs Assistance Bed Mobility: Supine to Sit;Rolling Rolling: Min assist (for pericare and pad placement.)   Supine to sit: Mod assist;+2 for physical assistance     General bed mobility comments: assistance to trunk elevation and scooting hips to edge of bed.  Transfers Overall transfer level: Needs assistance Equipment used: 2 person hand held assist;Ambulation equipment used (Performed pivot  from bed to commode +2 assistance HHA, performed all additional trial with sara stedy.) Transfers: Sit to/from [employer] Sit to Stand: Max assist;+2 physical assistance   Squat pivot transfers: Total assist;+2 physical assistance     General transfer comment: Pt performed squat pivot from bed to commode with intially in standing which turned quickly to a squat pivot as she began to sit before pivot completed.  Pt Performed additional sit to standing transfers in sara stedy with max +2  assistance.  Ambulation/Gait                 Stairs             Wheelchair Mobility    Modified Rankin (Stroke Patients Only)       Balance Overall balance assessment: Needs assistance Sitting-balance support: Feet supported;Bilateral upper extremity supported Sitting balance-Leahy Scale: Poor Sitting balance - Comments: long sitting in bed.   Standing balance support: Bilateral upper extremity supported Standing balance-Leahy Scale: Poor                              Cognition Arousal/Alertness: Awake/alert Behavior During Therapy: WFL for tasks assessed/performed Overall Cognitive Status: Within Functional Limits for tasks assessed                                        Exercises      General Comments        Pertinent Vitals/Pain Pain Assessment: Faces Faces Pain Scale: Hurts even more Pain Location: B LEs. Pain Descriptors /  Indicators: Sore;Operative site guarding Pain Intervention(s): Monitored during session;Repositioned    Home Living                      Prior Function            PT Goals (current goals can now be found in the care plan section) Acute Rehab PT Goals Patient Stated Goal: get better and go home Potential to Achieve Goals: Fair Progress towards PT goals: Progressing toward goals    Frequency    Min 3X/week      PT Plan Current plan remains appropriate    Co-evaluation               AM-PAC PT "6 Clicks" Mobility   Outcome Measure  Help needed turning from your back to your side while in a flat bed without using bedrails?: A Little Help needed moving from lying on your back to sitting on the side of a flat bed without using bedrails?: A Lot Help needed moving to and from a bed to a chair (including a wheelchair)?: A Lot Help needed standing up from a chair using your arms (e.g., wheelchair or bedside chair)?: A Lot Help needed to walk in hospital room?: Total Help needed climbing 3-5 steps with a railing? : Total 6 Click Score: 11    End of Session Equipment Utilized During Treatment: Gait belt Activity Tolerance: Patient limited by fatigue Patient left: in bed;with call bell/phone within reach;with family/visitor present Nurse Communication: Mobility status PT Visit Diagnosis: Unsteadiness on feet (R26.81);Other abnormalities of gait and mobility (R26.89);Muscle weakness (generalized) (M62.81);Difficulty in walking, not elsewhere classified (R26.2);Pain Pain - Right/Left: Right Pain - part of body:  (groin)     Time: 2952-8413 PT Time Calculation (min) (ACUTE ONLY): 40 min  Charges:  $Therapeutic Activity: 23-37 mins                     Bonney Leitz , PTA Acute Rehabilitation Services Pager [phone] Office [phone]     Jakevious Hollister Artis Delay 06/22/2020, 1:06 PM

## 2020-06-22 NOTE — Anesthesia Postprocedure Evaluation (Signed)
Anesthesia Post Note  Patient: Autumm Hattery  Procedure(s) Performed: WOUND VAC CHANGE (Bilateral ) DEBRIDEMENT FOR GROIN WOUNDS (Bilateral )     Patient location during evaluation: PACU Anesthesia Type: General Level of consciousness: awake and alert Pain management: pain level controlled Vital Signs Assessment: post-procedure vital signs reviewed and stable Respiratory status: spontaneous breathing, nonlabored ventilation, respiratory function stable and patient connected to nasal cannula oxygen Cardiovascular status: blood pressure returned to baseline and stable Postop Assessment: no apparent nausea or vomiting Anesthetic complications: no   No complications documented.  Last Vitals:  Vitals:   06/22/20 0409 06/22/20 0543  BP: (!) 151/47   Pulse: 70   Resp: 16 16  Temp: 36.9 C 37.1 C  SpO2: 95%     Last Pain:  Vitals:   06/22/20 0543  TempSrc: Oral  PainSc:                  Linda Biehn S

## 2020-06-22 NOTE — Progress Notes (Signed)
Occupational Therapy Treatment Patient Details Name: Holly Stevenson MRN: [id number] DOB: [birth year] Today's Date: 06/22/2020    History of present illness 70 yo admitted 12/29 with right groin dehiscence s/p debridement and graft of fem fem BPG. Additional surgeries 12/30 and 12/31 for endarterectomy and thrombolectomy with resultant LLE BPG occlusion and Rt abdominal hematoma. 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   OT comments  Patient continues to make steady progress towards goals in skilled OT session. Patient's session encompassed bed mobility, toilet transfers, and sit<>stand transfers with stedy in order to transition to the recliner. Pt remains extremely motivated, however is demonstrating poor seated balance and standing tolerance when attempting functional mobility with RLE, preemptively sitting when attempting to stand pivot to BSC (max A x2). Stedy utilized for sit<>stands to transition to recliner, however pt requires increased input at bilateral hips in order to come into full standing to utilize lift equipment. Due to multi-system involvement, and ability to progress, therapist continues to strongly recommend CIR. Therapy will continue to follow.    Follow Up Recommendations  CIR    Equipment Recommendations  3 in 1 bedside commode;Tub/shower bench;Wheelchair (measurements OT);Wheelchair cushion (measurements OT)    Recommendations for Other Services      Precautions / Restrictions Precautions Precautions: Fall Precaution Comments: bil groin VAC Restrictions Weight Bearing Restrictions: Yes LLE Weight Bearing: Non weight bearing       Mobility Bed Mobility Overal bed mobility: Needs Assistance Bed Mobility: Supine to Sit;Rolling Rolling: Min assist (for pericare and pad placement.)   Supine to sit: Mod assist;+2 for physical assistance     General bed  mobility comments: assistance to trunk elevation and scooting hips to edge of bed.  Transfers Overall transfer level: Needs assistance Equipment used: 2 person hand held assist;Ambulation equipment used (Performed pivot from bed to commode +2 assistance HHA, performed all additional trial with sara stedy.) Transfers: Sit to/from [employer] Sit to Stand: Max assist;+2 physical assistance   Squat pivot transfers: Total assist;+2 physical assistance     General transfer comment: Pt performed squat pivot from bed to commode with intially in standing which turned quickly to a squat pivot as she began to sit before pivot completed.  Pt Performed additional sit to standing transfers in sara stedy with max +2  assistance.    Balance Overall balance assessment: Needs assistance Sitting-balance support: Feet supported;Bilateral upper extremity supported Sitting balance-Leahy Scale: Poor Sitting balance - Comments: long sitting in bed.   Standing balance support: Bilateral upper extremity supported Standing balance-Leahy Scale: Poor                             ADL either performed or assessed with clinical judgement   ADL Overall ADL's : Needs assistance/impaired     Grooming: Wash/dry hands;Wash/dry face;Sitting;Oral care;Set up Grooming Details (indicate cue type and reason): set on [facility]                 Toilet Transfer: Maximal assistance;Stand-pivot;Cueing for safety;Cueing for sequencing Toilet Transfer Details (indicate cue type and reason): max A in order to stand pivot, pt pre-emptively sat, requiring increased assist from therapist's in order to complete         Functional mobility during ADLs: Maximal assistance;+2 for physical assistance;+2 for safety/equipment General ADL Comments: max A stand pivot and use of stedy to the recliner  due to deconditioning and need to navigate around with soley RLE     Vision       Perception     Praxis       Cognition Arousal/Alertness: Awake/alert Behavior During Therapy: WFL for tasks assessed/performed Overall Cognitive Status: Within Functional Limits for tasks assessed                                          Exercises     Shoulder Instructions       General Comments      Pertinent Vitals/ Pain       Pain Assessment: Faces Faces Pain Scale: Hurts even more Pain Location: B LEs. Pain Descriptors / Indicators: Sore;Operative site guarding Pain Intervention(s): Limited activity within patient's tolerance;Monitored during session;Repositioned  Home Living                                          Prior Functioning/Environment              Frequency  Min 2X/week        Progress Toward Goals  OT Goals(current goals can now be found in the care plan section)  Progress towards OT goals: Progressing toward goals  Acute Rehab OT Goals Patient Stated Goal: get better and go home OT Goal Formulation: With patient Time For Goal Achievement: 07/04/20 Potential to Achieve Goals: Good  Plan Discharge plan remains appropriate    Co-evaluation                 AM-PAC OT "6 Clicks" Daily Activity     Outcome Measure   Help from another person eating meals?: None Help from another person taking care of personal grooming?: A Little Help from another person toileting, which includes using toliet, bedpan, or urinal?: Total Help from another person bathing (including washing, rinsing, drying)?: A Lot Help from another person to put on and taking off regular upper body clothing?: A Little Help from another person to put on and taking off regular lower body clothing?: A Lot 6 Click Score: 15    End of Session Equipment Utilized During Treatment: Other (comment) Antony Salmon)  OT Visit Diagnosis: Muscle weakness (generalized) (M62.81);Pain;Other abnormalities of gait and mobility (R26.89)   Activity Tolerance Patient tolerated  treatment well   Patient Left in chair;with call bell/phone within reach   Nurse Communication Mobility status        Time: 1610-9604 OT Time Calculation (min): 40 min  Charges: OT General Charges $OT Visit: 1 Visit OT Treatments $Self Care/Home Management : 8-22 mins  Pollyann Glen E. Helane Briceno, COTA/L Acute Rehabilitation Services [phone]   Cherlyn Cushing 06/22/2020, 4:00 PM

## 2020-06-23 ENCOUNTER — Telehealth (HOSPITAL_COMMUNITY): Payer: Self-pay

## 2020-06-23 LAB — GLUCOSE, CAPILLARY
Glucose-Capillary: 219 mg/dL — ABNORMAL HIGH (ref 70–99)
Glucose-Capillary: 221 mg/dL — ABNORMAL HIGH (ref 70–99)
Glucose-Capillary: 204 mg/dL — ABNORMAL HIGH (ref 70–99)
Glucose-Capillary: 190 mg/dL — ABNORMAL HIGH (ref 70–99)

## 2020-06-23 NOTE — Progress Notes (Addendum)
  Progress Note    06/23/2020 8:13 AM 2 Days Post-Op  Subjective:  No complaints this morning   Vitals:   06/23/20 0327 06/23/20 0401  BP: (!) 199/65 (!) 170/55  Pulse: 78 69  Resp: 16 18  Temp: 98.4 F (36.9 C) 98 F (36.7 C)  SpO2: 98% 94%   Physical Exam: Lungs:  Non labored Incisions:  Groin incisions with wound vac, good seal; RLE vein harvest incisions c/d/i; dressings just changed Extremities:  R DP and PT brisk by doppler Neurologic: A&O  CBC    Component Value Date/Time   WBC 14.0 (H) 06/18/2020 0152   RBC 3.04 (L) 06/18/2020 0152   HGB 8.5 (L) 06/18/2020 0152   HCT 26.3 (L) 06/18/2020 0152   PLT 77 (L) 06/18/2020 0152   MCV 86.5 06/18/2020 0152   MCH 28.0 06/18/2020 0152   MCHC 32.3 06/18/2020 0152   RDW 17.0 (H) 06/18/2020 0152   LYMPHSABS 1.3 04/25/2020 0345   MONOABS 1.1 (H) 04/25/2020 0345   EOSABS 0.1 04/25/2020 0345   BASOSABS 0.0 04/25/2020 0345    BMET    Component Value Date/Time   NA 136 06/17/2020 0433   K 3.6 06/17/2020 0433   CL 107 06/17/2020 0433   CO2 17 (L) 06/17/2020 0433   GLUCOSE 189 (H) 06/17/2020 0433   BUN 16 06/17/2020 0433   CREATININE 0.94 06/17/2020 0433   CALCIUM 7.4 (L) 06/17/2020 0433   GFRNONAA >60 06/17/2020 0433   GFRAA 52 (L) 02/18/2018 1610    INR    Component Value Date/Time   INR 1.0 05/03/2020 1530     Intake/Output Summary (Last 24 hours) at 06/23/2020 0813 Last data filed at 06/22/2020 1900 Gross per 24 hour  Intake 1531 ml  Output 840 ml  Net 691 ml     Assessment/Plan:  70 y.o. female is s/p excision of infected graft material and subsequent L AKA 2 Days Post-Op   R foot well perfused with DP and PT by doppler Groin wound vacs to be changed later today with Dr. Lenell Antu Continue dry dressing changes R LE vein harvest incisions Patient and daughter considering CIR vs SNF   Emilie Rutter, PA-C [facility] [phone] 06/23/2020 8:13 AM  VASCULAR STAFF ADDENDUM: I  have independently interviewed and examined the patient. I agree with the above.  Looks great. Continues to improve. Patient tolerated VAC change at bedside very well. Wound beds healing well. Mild fibrinous exudate at base of right groin wound. Both wound volumes significantly smaller than previous. Continue antibiotics for now. Will discontinue Monday if wounds continue to appear healthy. Updated daughter Efraim Kaufmann. Starting disposition planning.  Would prefer she go to inpatient rehab to adjust to life with an amputation.  Rande Brunt. Lenell Antu, MD [facility] Phone Number: [phone] 06/23/2020 1:11 PM

## 2020-06-23 NOTE — Progress Notes (Signed)
Inpatient Rehab Admissions Coordinator:   I met with patient for ongoing discussion regarding potential CIR admit. She remains unsure of whether she wants CIR or SNF. I cannot open [employer] on a Friday, so will check in with pt. And  Request insurance auth on Monday if Pt. Wishes.   Clemens Catholic, [place], [place] Admissions Coordinator  [phone] ([place]) [phone] (office)

## 2020-06-23 NOTE — Progress Notes (Signed)
Physical Therapy Treatment Patient Details Name: Holly Stevenson MRN: [id number] DOB: [birth year] Today's Date: 06/23/2020    History of Present Illness 70 yo admitted 12/29 with right groin dehiscence s/p debridement and graft of fem fem BPG. Additional surgeries 12/30 and 12/31 for endarterectomy and thrombolectomy with resultant LLE BPG occlusion and Rt abdominal hematoma. 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    PT Comments    Pt supine in bed on arrival this session.  Pt eager to mobilize but very down on her self about her current situation and road to recover.  Pt able to stand in sara stedy this date with +1 mod assistance from elevated surface.  Continue to recommend aggressive rehab in a post acute setting to maximize functional gains before returning home.     Follow Up Recommendations  CIR;Supervision/Assistance - 24 hour     Equipment Recommendations  Wheelchair (measurements PT);Wheelchair cushion (measurements PT);3in1 (PT);Hospital bed    Recommendations for Other Services       Precautions / Restrictions Precautions Precautions: Fall Precaution Comments: bil groin VAC Restrictions Weight Bearing Restrictions: Yes LLE Weight Bearing: Non weight bearing    Mobility  Bed Mobility Overal bed mobility: Needs Assistance Bed Mobility: Supine to Sit     Supine to sit: Mod assist     General bed mobility comments: Mod assistance for LE advancement and trunk elevation.  Pt required cues for technique and use of bed pad to scoot hips to edge of bed.  Transfers Overall transfer level: Needs assistance Equipment used: Ambulation equipment used (sara stedy) Transfers: Sit to/from Stand Sit to Stand: Mod assist         General transfer comment: Mod assistance from L side to facilitate hip control on L side.  pt continues to benefit assistance to boost into  standing with edge of bed elevated to improve ease.  Ambulation/Gait Ambulation/Gait assistance:  (NT)               Stairs             Wheelchair Mobility    Modified Rankin (Stroke Patients Only)       Balance Overall balance assessment: Needs assistance Sitting-balance support: Feet supported;Bilateral upper extremity supported Sitting balance-Leahy Scale: Poor       Standing balance-Leahy Scale: Poor                              Cognition Arousal/Alertness: Awake/alert Behavior During Therapy: WFL for tasks assessed/performed Overall Cognitive Status: Within Functional Limits for tasks assessed Area of Impairment: Memory                     Memory: Decreased short-term memory         General Comments: A&Ox4.      Exercises      General Comments        Pertinent Vitals/Pain Pain Assessment: Faces Faces Pain Scale: Hurts whole lot Pain Location: B LEs. L >R Pain Descriptors / Indicators: Sore;Operative site guarding Pain Intervention(s): Monitored during session;Repositioned    Home Living                      Prior Function            PT Goals (current goals can now be found in the care plan section) Acute  Rehab PT Goals Patient Stated Goal: get better and go home Potential to Achieve Goals: Fair Progress towards PT goals: Progressing toward goals    Frequency    Min 3X/week      PT Plan Current plan remains appropriate    Co-evaluation              AM-PAC PT "6 Clicks" Mobility   Outcome Measure  Help needed turning from your back to your side while in a flat bed without using bedrails?: A Little Help needed moving from lying on your back to sitting on the side of a flat bed without using bedrails?: A Lot Help needed moving to and from a bed to a chair (including a wheelchair)?: A Lot Help needed standing up from a chair using your arms (e.g., wheelchair or bedside chair)?: A  Lot Help needed to walk in hospital room?: Total Help needed climbing 3-5 steps with a railing? : Total 6 Click Score: 11    End of Session Equipment Utilized During Treatment: Gait belt Activity Tolerance: Patient limited by fatigue Patient left: in bed;with call bell/phone within reach;with family/visitor present Nurse Communication: Mobility status PT Visit Diagnosis: Unsteadiness on feet (R26.81);Other abnormalities of gait and mobility (R26.89);Muscle weakness (generalized) (M62.81);Difficulty in walking, not elsewhere classified (R26.2);Pain Pain - Right/Left: Right Pain - part of body:  (groin)     Time: 9518-8416 PT Time Calculation (min) (ACUTE ONLY): 33 min  Charges:  $Therapeutic Activity: 23-37 mins                     Bonney Leitz , PTA Acute Rehabilitation Services Pager [phone] Office [phone]     Javi Bollman Artis Delay 06/23/2020, 1:37 PM

## 2020-06-23 NOTE — Telephone Encounter (Signed)
All short term disability was faxed to [facility], records and copy/confirmation was left up front for daughter Efraim Kaufmann to pick up.  [employer]

## 2020-06-23 NOTE — Telephone Encounter (Signed)
She has an OV with me on 1/21 to discuss the results.

## 2020-06-24 LAB — GLUCOSE, CAPILLARY
Glucose-Capillary: 175 mg/dL — ABNORMAL HIGH (ref 70–99)
Glucose-Capillary: 193 mg/dL — ABNORMAL HIGH (ref 70–99)
Glucose-Capillary: 168 mg/dL — ABNORMAL HIGH (ref 70–99)
Glucose-Capillary: 158 mg/dL — ABNORMAL HIGH (ref 70–99)

## 2020-06-24 NOTE — Progress Notes (Addendum)
[facility] [place]  Subjective  - Doing well given circumstances.   Objective (!) 168/55 66 98.6 F (37 C) (Oral) 20 97%  Intake/Output Summary (Last 24 hours) at 06/24/2020 0925 Last data filed at 06/23/2020 1800 Gross per 24 hour  Intake --  Output 1360 ml  Net -1360 ml    Wound vac to suction drain OP 160 cc last 24 hours Left stump feels warm Right LE incisions healing well Doppler signals PT/DP/Peroneal intact Lungs non labored breathing   Assessment/Planning: POD # 3  70 y.o. female is s/p excision of infected graft material and subsequent L AKA  Vac changes MWF Right LE well perfused Continue antibiotics for now. Will discontinue Monday  Mosetta Pigeon 06/24/2020 9:25 AM --  Laboratory Lab Results: No results for input(s): WBC, HGB, HCT, PLT in the last 72 hours. BMET No results for input(s): NA, K, CL, CO2, GLUCOSE, BUN, CREATININE, CALCIUM in the last 72 hours.  COAG Lab Results  Component Value Date   INR 1.0 05/03/2020   INR 1.0 04/25/2020   No results found for: PTT   I have seen and evaluated the patient. I agree with the PA note as documented above.  No acute events overnight.  We had to change both wound vacs at the bedside today given bilateral groin vacs stopped working this morning.  The skin is fairly weepy that makes getting seal difficult particularly in the right groin.  Pain seems reasonably controlled.  She does have a PCA.  She is on antibiotics.  Cephus Shelling, MD [facility] [place] Office: [phone]

## 2020-06-24 NOTE — Progress Notes (Signed)
Verbal order from MD obtained to insert foley catheter. Bladder scan 921 mL prior to insertion. Catheter inserted following hospital protocol.  amber urine returned, allowing time for catheter to be clamped, and patient expressed relief.  VSS. MD made aware. Will continue to monitor closely.  Allegra Grana RN

## 2020-06-25 LAB — GLUCOSE, CAPILLARY
Glucose-Capillary: 167 mg/dL — ABNORMAL HIGH (ref 70–99)
Glucose-Capillary: 125 mg/dL — ABNORMAL HIGH (ref 70–99)
Glucose-Capillary: 203 mg/dL — ABNORMAL HIGH (ref 70–99)
Glucose-Capillary: 173 mg/dL — ABNORMAL HIGH (ref 70–99)

## 2020-06-25 NOTE — Progress Notes (Addendum)
[facility] [place]  Subjective  - no new complaints   Objective (!) 148/47 68 98.5 F (36.9 C) (Oral) 14 95%  Intake/Output Summary (Last 24 hours) at 06/25/2020 0839 Last data filed at 06/25/2020 0617 Gross per 24 hour  Intake --  Output 3370 ml  Net -3370 ml    Wound vacs sealed this am, drainage 370 ml last 24 hour straw colored fluid Right LE warm and well perfused.  Left AKA warm to touch and incisions are healing well. Lungs non labored breathing  Assessment/Planning: POD # 4 70 y.o.femaleis s/p excision of infected graft material and subsequent L AKA  Wound vacs to suction without leaks currently.  If leak occurs on right groin the wound may be packed with wet to dry dressings. Cont. Current treatment.  Plan for vac change tomorrow with DR. Hawken. Continue antibiotics for now. Will discontinue Monday    Holly Stevenson 06/25/2020 8:39 AM --  Laboratory Lab Results: No results for input(s): WBC, HGB, HCT, PLT in the last 72 hours. BMET No results for input(s): NA, K, CL, CO2, GLUCOSE, BUN, CREATININE, CALCIUM in the last 72 hours.  COAG Lab Results  Component Value Date   INR 1.0 05/03/2020   INR 1.0 04/25/2020   No results found for: PTT   I have seen and evaluated the patient. I agree with the PA note as documented above.  No acute events overnight.  We did have to change her vacs yesterday at bedside given both groins had lost seal.  This morning vacs still appear to have good seal bilaterally.  She did get a Foley placed yesterday for urinary retention and had over 2 L output.  Does not feel she can get on a bedpan at this time.  The left AKA looks viable.  Likely will require another VAC change tomorrow.  Continue IV antibiotics  Cephus Shelling, MD [facility] [place] Office: [phone]

## 2020-06-25 NOTE — Progress Notes (Signed)
Right groin wound vac continuously shows blockage and leak, unable to reinforce. It has been clamped and wet to dry dressing applied.   Will continue to monitor left groin.

## 2020-06-26 ENCOUNTER — Inpatient Hospital Stay (HOSPITAL_COMMUNITY): Payer: BC Managed Care – PPO

## 2020-06-26 LAB — CBC
HCT: 28.1 % — ABNORMAL LOW (ref 36.0–46.0)
Hemoglobin: 8.8 g/dL — ABNORMAL LOW (ref 12.0–15.0)
MCH: 28.4 pg (ref 26.0–34.0)
MCHC: 31.3 g/dL (ref 30.0–36.0)
MCV: 90.6 fL (ref 80.0–100.0)
Platelets: 268 10*3/uL (ref 150–400)
RBC: 3.1 MIL/uL — ABNORMAL LOW (ref 3.87–5.11)
RDW: 17.1 % — ABNORMAL HIGH (ref 11.5–15.5)
WBC: 14.8 10*3/uL — ABNORMAL HIGH (ref 4.0–10.5)
nRBC: 0 % (ref 0.0–0.2)

## 2020-06-26 LAB — BASIC METABOLIC PANEL
Anion gap: 9 (ref 5–15)
BUN: 15 mg/dL (ref 8–23)
CO2: 23 mmol/L (ref 22–32)
Calcium: 7.2 mg/dL — ABNORMAL LOW (ref 8.9–10.3)
Chloride: 104 mmol/L (ref 98–111)
Creatinine, Ser: 0.64 mg/dL (ref 0.44–1.00)
GFR, Estimated: >60 mL/min (ref >60)
Glucose, Bld: 208 mg/dL — ABNORMAL HIGH (ref 70–99)
Potassium: 3.6 mmol/L (ref 3.5–5.1)
Sodium: 136 mmol/L (ref 135–145)

## 2020-06-26 LAB — GLUCOSE, CAPILLARY
Glucose-Capillary: 185 mg/dL — ABNORMAL HIGH (ref 70–99)
Glucose-Capillary: 205 mg/dL — ABNORMAL HIGH (ref 70–99)
Glucose-Capillary: 221 mg/dL — ABNORMAL HIGH (ref 70–99)
Glucose-Capillary: 213 mg/dL — ABNORMAL HIGH (ref 70–99)
Glucose-Capillary: 194 mg/dL — ABNORMAL HIGH (ref 70–99)

## 2020-06-26 LAB — PHOSPHORUS
Phosphorus: 2.1 mg/dL — ABNORMAL LOW (ref 2.5–4.6)
Phosphorus: 2 mg/dL — ABNORMAL LOW (ref 2.5–4.6)

## 2020-06-26 LAB — HEPATIC FUNCTION PANEL
ALT: 46 U/L — ABNORMAL HIGH (ref 0–44)
AST: 70 U/L — ABNORMAL HIGH (ref 15–41)
Albumin: 1.5 g/dL — ABNORMAL LOW (ref 3.5–5.0)
Alkaline Phosphatase: 90 U/L (ref 38–126)
Bilirubin, Direct: 0.2 mg/dL (ref 0.0–0.2)
Indirect Bilirubin: 0.8 mg/dL (ref 0.3–0.9)
Total Bilirubin: 1 mg/dL (ref 0.3–1.2)
Total Protein: 5.5 g/dL — ABNORMAL LOW (ref 6.5–8.1)

## 2020-06-26 LAB — PREALBUMIN: Prealbumin: 8 mg/dL — ABNORMAL LOW (ref 18–38)

## 2020-06-26 LAB — MAGNESIUM
Magnesium: 2.1 mg/dL (ref 1.7–2.4)
Magnesium: 2 mg/dL (ref 1.7–2.4)

## 2020-06-26 MED ORDER — PROSOURCE TF PO LIQD
45.0000 mL | Freq: Four times a day (QID) | ORAL | Status: DC
  Administered 2020-06-26 – 2020-06-28 (×7): 45 mL
  Filled 2020-06-26 (×11): qty 45

## 2020-06-26 MED ORDER — OSMOLITE 1.2 CAL PO LIQD
1000.0000 mL | ORAL | Status: DC
  Filled 2020-06-26: qty 1000

## 2020-06-26 MED ORDER — OSMOLITE 1.5 CAL PO LIQD
1000.0000 mL | ORAL | Status: DC
  Administered 2020-06-26: 1000 mL
  Filled 2020-06-26 (×2): qty 1000

## 2020-06-26 NOTE — Progress Notes (Addendum)
  Progress Note    06/26/2020 9:30 AM 5 Days Post-Op  Subjective:  No complaints   Vitals:   06/26/20 0405 06/26/20 0757  BP:  (!) 156/47  Pulse:  74  Resp: 16 20  Temp:  98.9 F (37.2 C)  SpO2:  96%   Physical Exam: Lungs:  Non labored Incisions:  Groins with vac in place; L AKA and thigh incision healing well Extremities:  R foot warm Neurologic: A&O  CBC    Component Value Date/Time   WBC 14.8 (H) 06/26/2020 0548   RBC 3.10 (L) 06/26/2020 0548   HGB 8.8 (L) 06/26/2020 0548   HCT 28.1 (L) 06/26/2020 0548   PLT 268 06/26/2020 0548   MCV 90.6 06/26/2020 0548   MCH 28.4 06/26/2020 0548   MCHC 31.3 06/26/2020 0548   RDW 17.1 (H) 06/26/2020 0548   LYMPHSABS 1.3 04/25/2020 0345   MONOABS 1.1 (H) 04/25/2020 0345   EOSABS 0.1 04/25/2020 0345   BASOSABS 0.0 04/25/2020 0345    BMET    Component Value Date/Time   NA 136 06/26/2020 0548   K 3.6 06/26/2020 0548   CL 104 06/26/2020 0548   CO2 23 06/26/2020 0548   GLUCOSE 208 (H) 06/26/2020 0548   BUN 15 06/26/2020 0548   CREATININE 0.64 06/26/2020 0548   CALCIUM 7.2 (L) 06/26/2020 0548   GFRNONAA >60 06/26/2020 0548   GFRAA 52 (L) 02/18/2018 1610    INR    Component Value Date/Time   INR 1.0 05/03/2020 1530     Intake/Output Summary (Last 24 hours) at 06/26/2020 0930 Last data filed at 06/26/2020 0802 Gross per 24 hour  Intake -  Output 2276 ml  Net -2276 ml     Assessment/Plan:  70 y.o. female is s/p excsision of infected graft material and subsequent L AKA  5 Days Post-Op   RLE well perfused R groin vac change tolerated well at bedside; Continue on MWF schedule; will consult WOC team to assist starting Wednesday 1/12 Poor nutrition status; prealbumin 8; will place coretrak today Continue work with therapy teams   Emilie Rutter, PA-C [facility] [phone] 06/26/2020 9:30 AM  VASCULAR STAFF ADDENDUM: I have independently interviewed and examined the patient. I agree  with the above.  Open groin wounds. Continue VAC therapy. Slowly improving. Malnutrition. Decline in prealbmin despite aggressive PO supplementation. Place NGT and start TF. Persistent leukocytosis. Trend. Continue Ampicillin. Check UA/CXR/BCx. If no improvement by Wednesday will check CT vs. return to OR for washout.  Rande Brunt. Lenell Antu, MD [facility] Phone Number: [phone] 06/26/2020 9:39 AM

## 2020-06-26 NOTE — Procedures (Signed)
Cortrak  Person Inserting Tube:  Eddison Searls M, RD Tube Type:  Cortrak - 43 inches Tube Location:  Right nare Initial Placement:  Stomach Secured by: Bridle Technique Used to Measure Tube Placement:  Documented cm marking at nare/ corner of mouth Cortrak Secured At:  70 cm Procedure Comments:  Cortrak Tube Team Note:  Consult received to place a Cortrak feeding tube.   No x-ray is required. RN may begin using tube.   If the tube becomes dislodged please keep the tube and contact the Cortrak team at [email or web address] ([email or web address]) for replacement.  If after hours and replacement cannot be delayed, place a NG tube and confirm placement with an abdominal x-ray.       Candie Gintz, MS, RD, LDN, CNSC Inpatient Clinical Dietitian RD pager # available in AMION  After hours/weekend pager # available in AMION      

## 2020-06-26 NOTE — Progress Notes (Addendum)
Nutrition Follow Up  DOCUMENTATION CODES:   Not applicable  INTERVENTION:   Tube feeding:  -Osmolite 1.5 @ 75 ml/hr x 14 hrs (1800-0800) via Cortrak -ProSource TF 45 ml QID  Provides: 1735 kcals, 110 grams protein, 800 ml free water. Meets 87% kcal needs and 100% protein needs.    Glucerna Shake po TID, each supplement provides 220 kcal and 10 grams of protein  MVI daily   May wean tube feeding as PO intake progresses.   NUTRITION DIAGNOSIS:   Increased nutrient needs related to post-op healing as evidenced by estimated needs.  Ongoing  GOAL:   Patient will meet greater than or equal to 90% of their needs   Addressed via TF  MONITOR:   PO intake,Supplement acceptance,Weight trends,Labs,I & O's  REASON FOR ASSESSMENT:   Consult Assessment of nutrition requirement/status  ASSESSMENT:   Patient with PMH significant for DM, HTN, PVD s/p R groin exploration, repair of pseudoaneurysm with interposition grafting, thrombectomy of fem-fem bypass, thrombectomy of L fem-pop bypass. Presents this admission with R groin dehiscence.   12/29- s/p excisional debridement to R groin 12/30- s/p excision of R to L fem-fem bypass, reopening of L groin, stenting 12/31- s/p L common femoral and external iliac endarterectomy, L popliteal artery endarterectomy, stenting  1/02- L AKA, bilateral groin VAC change  Appetite remains off/on. Pt reports eating ~50 % of meals. Meal completions lack documentation making it difficult to confirm. Pt taking 1-2 Glucerna daily. Discussed that pt has increased protein/kcal needs to promote healing. A Cortrak has been ordered to help pt meet needs. Will run nocturnal to allow for increased PO intake during the day.   Of note, prealbumin is strongly affected by stress response and inflammatory process, therefore, do not expect to see an improvement in this lab value during acute hospitalization.  Admission weight: 81.6 kg (stated) Current weight: 87.9  kg (taken 1/5)  UOP: 1200 ml x 24 hrs Wound VAC (L): 25 ml x 24 hrs Wound VAC (R): 125 ml x 24 hrs   Medications: colace, SS novolog, miralax, senokot  Labs: CBG [id number]  Diet Order            Diet heart healthy/carb modified Room service appropriate? Yes; Fluid consistency: Thin  Diet effective now                 EDUCATION NEEDS:   Education needs have been addressed  Skin:  Skin Assessment: Skin Integrity Issues: Skin Integrity Issues:: Incisions,Wound VAC,Stage I,DTI DTI: buttocks Stage I: buttocks Wound Vac: L/R groin Incisions: L leg, R ankle, groin x2  Last BM:  1/9  Height:   Ht Readings from Last 1 Encounters:  06/21/20 5\' 8"  (1.727 m)    Weight:   Wt Readings from Last 1 Encounters:  06/21/20 87.9 kg    BMI:  Body mass index is 29.46 kg/m.  Estimated Nutritional Needs:   Kcal:  2000-2200 kcal  Protein:  100-120 grams  Fluid:  >/= 2 L/day  08/19/20 RD, LDN Clinical Nutrition Pager listed in AMION

## 2020-06-26 NOTE — Progress Notes (Signed)
Inpatient Rehab Admissions Coordinator:   Following for my colleague, Cheri Rous.  Note pt still requiring PCA pump for pain control.  Will need to have pain controlled on PO medications for consideration for CIR. Will continue to follow.   Estill Dooms, PT, DPT Admissions Coordinator [phone] 06/26/20  3:51 PM

## 2020-06-26 NOTE — Progress Notes (Signed)
Physical Therapy Treatment Patient Details Name: Holly Stevenson MRN: [id number] DOB: [birth year] Today's Date: 06/26/2020    History of Present Illness 70 yo admitted 12/29 with right groin dehiscence s/p debridement and graft of fem fem BPG. Additional surgeries 12/30 and 12/31 for endarterectomy and thrombolectomy with resultant LLE BPG occlusion and Rt abdominal hematoma. 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    PT Comments    Pt received in bed, eager to participate in therapy. She required mod assist bed mobility. Sit to stand x 4 trials in stedy from EOB. Pt unable to attain full stance with +1 assist today. Only able to clear bottom a few inches from bed. Pt is very motivated to participate in therapy and regain strength/independence. She is an excellent rehab candidate.    Follow Up Recommendations  CIR;Supervision/Assistance - 24 hour     Equipment Recommendations  Wheelchair (measurements PT);Wheelchair cushion (measurements PT);3in1 (PT);Hospital bed    Recommendations for Other Services       Precautions / Restrictions Precautions Precautions: Fall;Other (comment) Precaution Comments: bil groin VAC Restrictions LLE Weight Bearing: Non weight bearing    Mobility  Bed Mobility Overal bed mobility: Needs Assistance Bed Mobility: Supine to Sit;Sit to Supine;Rolling Rolling: Min assist   Supine to sit: Mod assist;HOB elevated Sit to supine: Mod assist   General bed mobility comments: +rail, increased time, cues for sequencing, use of bed pad to scoot to EOB  Transfers Overall transfer level: Needs assistance   Transfers: Sit to/from Stand Sit to Stand: Max assist         General transfer comment: unable to attain upright stance in steady with +1 assist today. Only cleared bottom a few inches from the bed. Attempted x 4 trials. Pt noted to have BM;  therefore, returned to bed for pericare.  Ambulation/Gait             General Gait Details: unable   Stairs             Wheelchair Mobility    Modified Rankin (Stroke Patients Only)       Balance Overall balance assessment: Needs assistance Sitting-balance support: Feet supported;Bilateral upper extremity supported Sitting balance-Leahy Scale: Poor Sitting balance - Comments: reliant on BUE support                                    Cognition Arousal/Alertness: Awake/alert Behavior During Therapy: WFL for tasks assessed/performed Overall Cognitive Status: Within Functional Limits for tasks assessed                                        Exercises      General Comments General comments (skin integrity, edema, etc.): VSS on RA      Pertinent Vitals/Pain Pain Assessment: Faces Faces Pain Scale: Hurts whole lot Pain Location: B LEs. L >R Pain Descriptors / Indicators: Sore;Operative site guarding;Grimacing;Guarding Pain Intervention(s): Monitored during session;Repositioned;PCA encouraged    Home Living                      Prior Function            PT Goals (current goals can now be found in the care plan section) Acute Rehab PT  Goals Patient Stated Goal: get better and go home Progress towards PT goals: Progressing toward goals    Frequency    Min 3X/week      PT Plan Current plan remains appropriate    Co-evaluation              AM-PAC PT "6 Clicks" Mobility   Outcome Measure  Help needed turning from your back to your side while in a flat bed without using bedrails?: A Little Help needed moving from lying on your back to sitting on the side of a flat bed without using bedrails?: A Lot Help needed moving to and from a bed to a chair (including a wheelchair)?: A Lot Help needed standing up from a chair using your arms (e.g., wheelchair or bedside chair)?: A Lot   Help needed climbing 3-5  steps with a railing? : Total 6 Click Score: 10    End of Session Equipment Utilized During Treatment: Gait belt Activity Tolerance: Patient limited by pain Patient left: in bed;with call bell/phone within reach Nurse Communication: Mobility status;Other (comment) (sacral pad needs to be applied) PT Visit Diagnosis: Unsteadiness on feet (R26.81);Other abnormalities of gait and mobility (R26.89);Muscle weakness (generalized) (M62.81);Difficulty in walking, not elsewhere classified (R26.2);Pain Pain - Right/Left: Left Pain - part of body: Leg     Time: 3094-0768 PT Time Calculation (min) (ACUTE ONLY): 44 min  Charges:  $Therapeutic Activity: 38-52 mins                     Aida Raider, PT  Office # [phone] Pager [phone]    Ilda Foil 06/26/2020, 9:08 AM

## 2020-06-27 ENCOUNTER — Inpatient Hospital Stay (HOSPITAL_COMMUNITY): Payer: BC Managed Care – PPO

## 2020-06-27 DIAGNOSIS — J9601 Acute respiratory failure with hypoxia: Secondary | ICD-10-CM | POA: Diagnosis not present

## 2020-06-27 DIAGNOSIS — R9389 Abnormal findings on diagnostic imaging of other specified body structures: Secondary | ICD-10-CM

## 2020-06-27 LAB — URINALYSIS, ROUTINE W REFLEX MICROSCOPIC
Bilirubin Urine: NEGATIVE
Glucose, UA: >=500 mg/dL — AB
Ketones, ur: NEGATIVE mg/dL
Nitrite: NEGATIVE
Protein, ur: 100 mg/dL — AB
Specific Gravity, Urine: 1.037 — ABNORMAL HIGH (ref 1.005–1.030)
pH: 5 (ref 5.0–8.0)

## 2020-06-27 LAB — BASIC METABOLIC PANEL
Anion gap: 11 (ref 5–15)
BUN: 16 mg/dL (ref 8–23)
CO2: 22 mmol/L (ref 22–32)
Calcium: 7.2 mg/dL — ABNORMAL LOW (ref 8.9–10.3)
Chloride: 104 mmol/L (ref 98–111)
Creatinine, Ser: 0.61 mg/dL (ref 0.44–1.00)
GFR, Estimated: >60 mL/min (ref >60)
Glucose, Bld: 240 mg/dL — ABNORMAL HIGH (ref 70–99)
Potassium: 3.6 mmol/L (ref 3.5–5.1)
Sodium: 137 mmol/L (ref 135–145)

## 2020-06-27 LAB — GLUCOSE, CAPILLARY
Glucose-Capillary: 248 mg/dL — ABNORMAL HIGH (ref 70–99)
Glucose-Capillary: 207 mg/dL — ABNORMAL HIGH (ref 70–99)
Glucose-Capillary: 171 mg/dL — ABNORMAL HIGH (ref 70–99)
Glucose-Capillary: 171 mg/dL — ABNORMAL HIGH (ref 70–99)

## 2020-06-27 LAB — PHOSPHORUS
Phosphorus: 1.9 mg/dL — ABNORMAL LOW (ref 2.5–4.6)
Phosphorus: 1.9 mg/dL — ABNORMAL LOW (ref 2.5–4.6)

## 2020-06-27 LAB — CBC
HCT: 24.7 % — ABNORMAL LOW (ref 36.0–46.0)
Hemoglobin: 8.1 g/dL — ABNORMAL LOW (ref 12.0–15.0)
MCH: 29.9 pg (ref 26.0–34.0)
MCHC: 32.8 g/dL (ref 30.0–36.0)
MCV: 91.1 fL (ref 80.0–100.0)
Platelets: 279 10*3/uL (ref 150–400)
RBC: 2.71 MIL/uL — ABNORMAL LOW (ref 3.87–5.11)
RDW: 17.3 % — ABNORMAL HIGH (ref 11.5–15.5)
WBC: 17.6 10*3/uL — ABNORMAL HIGH (ref 4.0–10.5)
nRBC: 0 % (ref 0.0–0.2)

## 2020-06-27 LAB — MAGNESIUM
Magnesium: 2.1 mg/dL (ref 1.7–2.4)
Magnesium: 2.1 mg/dL (ref 1.7–2.4)

## 2020-06-27 MED ORDER — FUROSEMIDE 10 MG/ML IJ SOLN: 40.0000 mg | Freq: Once | INTRAMUSCULAR | Status: DC

## 2020-06-27 MED ORDER — VANCOMYCIN HCL 1500 MG/300ML IV SOLN
1500.0000 mg | INTRAVENOUS | Status: DC
  Administered 2020-06-27 – 2020-06-28 (×2): 1500 mg via INTRAVENOUS
  Filled 2020-06-27 (×3): qty 300

## 2020-06-27 MED ORDER — ENOXAPARIN SODIUM 40 MG/0.4ML ~~LOC~~ SOLN
40.0000 mg | SUBCUTANEOUS | Status: DC
  Administered 2020-06-28 – 2020-07-17 (×19): 40 mg via SUBCUTANEOUS
  Filled 2020-06-27 (×19): qty 0.4

## 2020-06-27 MED ORDER — ENOXAPARIN SODIUM 100 MG/ML ~~LOC~~ SOLN
90.0000 mg | Freq: Two times a day (BID) | SUBCUTANEOUS | Status: DC
  Administered 2020-06-27: 90 mg via SUBCUTANEOUS
  Filled 2020-06-27: qty 0.9

## 2020-06-27 MED ORDER — SODIUM CHLORIDE 0.9 % IV SOLN
2.0000 g | Freq: Three times a day (TID) | INTRAVENOUS | Status: DC
  Administered 2020-06-27 – 2020-06-29 (×6): 2 g via INTRAVENOUS
  Filled 2020-06-27 (×6): qty 2

## 2020-06-27 MED ORDER — IOHEXOL 350 MG/ML SOLN
55.0000 mL | Freq: Once | INTRAVENOUS | Status: AC | PRN
  Administered 2020-06-27: 55 mL via INTRAVENOUS

## 2020-06-27 MED ORDER — FUROSEMIDE 10 MG/ML IJ SOLN
20.0000 mg | Freq: Once | INTRAMUSCULAR | Status: AC
  Administered 2020-06-27: 20 mg via INTRAVENOUS
  Filled 2020-06-27: qty 2

## 2020-06-27 NOTE — Progress Notes (Signed)
OT Cancellation Note  Patient Details Name: Loralai Eisman MRN: [id number] DOB: [birth year]   Cancelled Treatment:    Reason Eval/Treat Not Completed: Patient declined, no reason specified. Patient met eating lunch. Request for OT to return at later time. Patient also notes fatigue from previous PT session. OT will check back as time allows.   Gloris Manchester OTR/L Supplemental OT, Department of rehab services [phone]  Verlon Carcione R H. 06/27/2020, 1:52 PM

## 2020-06-27 NOTE — Progress Notes (Signed)
Physical Therapy Treatment Patient Details Name: Holly Stevenson MRN: [id number] DOB: 1950/07/25 Today's Date: 06/27/2020    History of Present Illness 70 yo admitted 12/29 with right groin dehiscence s/p debridement and graft of fem fem BPG. Additional surgeries 12/30 and 12/31 for endarterectomy and thrombolectomy with resultant LLE BPG occlusion and Rt abdominal hematoma. 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    PT Comments    Pt limited this session due to fear of exacerbating hypoxia.  SPO2 stable this session.  Performed B LE strengthening and IS use.  IS x 10 reps quality 5101ml-750ml.  Pt required education to perform this every hour to improve lung capacity and function.  Post session left sitting in bed in chair position.  Pt continues to be an excellent candidate for aggressive rehab in CIR setting.      Follow Up Recommendations  CIR;Supervision/Assistance - 24 hour     Equipment Recommendations  Wheelchair (measurements PT);Wheelchair cushion (measurements PT);3in1 (PT);Hospital bed    Recommendations for Other Services       Precautions / Restrictions Precautions Precautions: Fall;Other (comment) Precaution Comments: bil groin VAC Restrictions Weight Bearing Restrictions: Yes LLE Weight Bearing: Non weight bearing    Mobility  Bed Mobility Overal bed mobility: Needs Assistance   Rolling: Min guard (to the R and L to place new pad pad.)         General bed mobility comments: Mod assistance with use of bed pad to reposition and boost to [facility].  Pt utilized head board to scoot.  Transfers                    Ambulation/Gait                 Stairs             Wheelchair Mobility    Modified Rankin (Stroke Patients Only)       Balance                                            Cognition  Arousal/Alertness: Awake/alert Behavior During Therapy: WFL for tasks assessed/performed Overall Cognitive Status: Within Functional Limits for tasks assessed                                        Exercises General Exercises - Lower Extremity Ankle Circles/Pumps: AROM;Right;15 reps;Supine Quad Sets: AROM;Right;10 reps;Supine Heel Slides: AROM;Right;10 reps;Supine Straight Leg Raises: AAROM;Right;10 reps;Supine Low Level/ICU Exercises Stabilized Bridging: AROM;Right;10 reps;Supine Amputee Exercises Hip ABduction/ADduction: AROM;Left;10 reps;Supine Straight Leg Raises: AROM;Right;10 reps;Supine    General Comments        Pertinent Vitals/Pain Pain Assessment: Faces Pain Location: B LEs. L >R Pain Descriptors / Indicators: Sore;Operative site guarding;Grimacing;Guarding Pain Intervention(s): Monitored during session;Repositioned    Home Living                      Prior Function            PT Goals (current goals can now be found in the care plan section) Acute Rehab PT Goals Patient Stated Goal: get better and go home Potential to Achieve Goals: Fair Progress towards PT goals: Progressing toward goals  Frequency    Min 3X/week      PT Plan Current plan remains appropriate    Co-evaluation              AM-PAC PT "6 Clicks" Mobility   Outcome Measure  Help needed turning from your back to your side while in a flat bed without using bedrails?: A Lot Help needed moving from lying on your back to sitting on the side of a flat bed without using bedrails?: Total Help needed moving to and from a bed to a chair (including a wheelchair)?: Total Help needed standing up from a chair using your arms (e.g., wheelchair or bedside chair)?: Total Help needed to walk in hospital room?: Total Help needed climbing 3-5 steps with a railing? : Total 6 Click Score: 7    End of Session Equipment Utilized During Treatment: Gait belt Activity  Tolerance: Patient limited by pain Patient left: in bed;with call bell/phone within reach (placed in chair position.) Nurse Communication: Mobility status PT Visit Diagnosis: Unsteadiness on feet (R26.81);Other abnormalities of gait and mobility (R26.89);Muscle weakness (generalized) (M62.81);Difficulty in walking, not elsewhere classified (R26.2);Pain Pain - Right/Left: Left Pain - part of body: Leg     Time: 9518-8416 PT Time Calculation (min) (ACUTE ONLY): 23 min  Charges:  $Therapeutic Exercise: 8-22 mins $Therapeutic Activity: 8-22 mins                     Holly Stevenson , PTA Acute Rehabilitation Services Pager [phone] Office [phone]     Holly Stevenson Artis Delay 06/27/2020, 1:15 PM

## 2020-06-27 NOTE — Progress Notes (Signed)
Pharmacy Antibiotic Note  Holly Stevenson is a 70 y.o. female with a groin infection on ampicillin. WBC increasing and coverage being broadened to vancomycin and cefepime (concern for PNA) Pharmacy has been consulted for vancomycin dosing. -WBC= 17.6, tmax= 100, SCr= 0.6, CrCl ~ 75  Plan: -vancomycin 1500mg  IV q24h (estimated AUC ~ 400) -continue cefepime 2gm IV q8h  Height: 5\' 8"  (172.7 cm) Weight: 87.9 kg (193 lb 12.6 oz) IBW/kg (Calculated) : 63.9  Temp (24hrs), Avg:99 F (37.2 C), Min:98.5 F (36.9 C), Max:99.3 F (37.4 C)  Recent Labs  Lab 06/26/20 0548 06/27/20 0805  WBC 14.8* 17.6*  CREATININE 0.64 0.61    Estimated Creatinine Clearance: 75.9 mL/min (by C-G formula based on SCr of 0.61 mg/dL).    No Known Allergies  Antimicrobials this admission: Ampicillin 1/3>> 1/11 Vanc 12/29>> 1/3 Zosyn 12/29>> 1/3 Cefepime 1/11>> vanc 1/11>>   Dose adjustments this admission:   Microbiology results: 12/29 wound culture> enterococcus faec- panS, e coli panS 1/10 blood x2  Thank you for allowing pharmacy to be a part of this patient's care.  3/11, PharmD Clinical Pharmacist **Pharmacist phone directory can now be found on [email or web address] ([email or web address]).  Listed under [facility].

## 2020-06-27 NOTE — Progress Notes (Signed)
CTA suggests cavitary lesion and likely pneumonia. Will ask pulmonary to evaluate.  Broaden ABx to vanc / cefepime.  Rande Brunt. Lenell Antu, MD [facility] Phone Number: [phone] 06/27/2020 10:41 AM

## 2020-06-27 NOTE — Progress Notes (Signed)
NAME:  Deziyah Arvin, MRN:  [id number], DOB:  Jan 11, 1951, LOS: 13 ADMISSION DATE:  06/14/2020, CONSULTATION DATE:  06/16/20 REFERRING MD:  Randie Heinz  CHIEF COMPLAINT:  Vent Management   Brief History   Alyssandra Hulsebus is a 70 y.o. female who has had multiple vascular surgeries, most recently L common femoral and external iliac endarterectomy with vein patch angioplasty, bilateral common iliac artery stenting, stenting of left external iliac artery, L popliteal artery endarterectomy, L common femoral to above knee popliteal bypass, L lower extremity thromboembolectomy on 12/31.  She was left intubated post op and PCCM asked to assist with vent management.  History of present illness   Pt is encephelopathic; therefore, this HPI is obtained from chart review. Markeeta Scalf is a 70 y.o. female who has a PMH as outlined below.  She has had multiple vascular surgeries (see significant events below), most recently L common femoral and external iliac endarterectomy with vein patch angioplasty, bilateral common iliac artery stenting, stenting of left external iliac artery, L popliteal artery endarterectomy, L common femoral to above knee popliteal bypass, L lower extremity thromboembolectomy on 12/31. on 12/31.  She was left intubated post op and PCCM asked to assist with vent management.  Of note, she had navigational bronchoscopy of RUL mass 12/21 with Dr. Delton Coombes.  Path was negative for malignancy but showed granulomas of unclear etiology. Was scheduled for PET scan which she was not able to get done yet.  Past Medical History  has Diabetic infection of left foot (HCC); DM2 (diabetes mellitus, type 2) (HCC); CKD (chronic kidney disease) stage 3, GFR 30-59 ml/min (HCC); HTN (hypertension); Cavitary lesion of lung; Cellulitis of toe of left foot; Idiopathic chronic gout of left knee without tophus; Gangrene of toe of left foot (HCC); PAD (peripheral artery disease) (HCC); Critical lower limb  ischemia (HCC); COPD (chronic obstructive pulmonary disease) (HCC); Diabetic renal disease (HCC); Hyperlipidemia; Personal history of colonic polyps; Polyneuropathy; Tobacco user; Infected prosthetic vascular graft (HCC); Status post surgery; Respiratory failure (HCC); and Pressure injury of skin on their problem list.  Significant Hospital Events   12/16 > exploration of right groin and control of hemorrhage, thrombectomy right to left femoral bypass graft, interposition grafting right common femoral artery to femoral femoral bypass, thrombectomy left femoral popliteal bypass graft, angiogram LLE. 12/21 > EBUS of RUL > granulomas. 12/29 > sharp excisional debridement right groin wound, pulsavac irrigation right groin wound, interposition graft of femorofemoral bypass. 12/30 > Excision of right to left femorofemoral bypass, right common femoral bovine pericardial patch and left femoral to popliteal artery bypass graft with reopening of left groin and left above knee popliteal exposure, harvest of right greater saphenous vein, patch angioplasty of right common femoral artery, left common femoral endarterectomy and saphenous vein angioplasty, left popliteal artery vein angioplasty, aortogram, stent of left common and external iliac veins, stent of L SFA, sartorius muscle flap right groin wound. 12/31 > L common femoral and external iliac endarterectomy with vein patch angioplasty, harvest L greater saphenous vein, aortogram with bilateral iliofemoral runoff,  bilateral common iliac artery stenting, stenting of left external iliac artery, L popliteal artery endarterectomy, L common femoral to above knee popliteal bypass, L lower extremity thromboembolectomy 1/11 Abrupt onset SOB. Improved with neb, sent for CTA chest with suspicion for PE. CTA neg for PE but does show some progression of cavitary lesion, and surrounding decreased aeration. Pulm re-consulted in this setting  Consults:  PCCM  Procedures:    12/21 EBUS  Significant Diagnostic Tests:  CTA chest 1/11- no evidence of PE. R apical cavitary lesion, slight progression from CTA chest 11/9, now with worsening aeration of RUL compared to CTA 11/9. Bilateral pleural effusions, new from prior.    Micro Data:  COVID 12/29 > neg. Right groin wound 12/29 > Few enterococcus faecalis, rare E.coli, Rare bacteroides vulgatus   Antimicrobials:  Vanc 1/11> Cefepime 1/11 >     Interim history/subjective:  CTA 1/11 without evidence of PE, but with some progression of cavitation, RUL worsening aeration   Objective:  Blood pressure (!) 151/49, pulse 81, temperature 99 F (37.2 C), temperature source Oral, resp. rate (!) 22, height 5\' 8"  (1.727 m), weight 87.9 kg, SpO2 95 %.        Intake/Output Summary (Last 24 hours) at 06/27/2020 1357 Last data filed at 06/27/2020 0730 Gross per 24 hour  Intake 1708.92 ml  Output 750 ml  Net 958.92 ml   Filed Weights   06/14/20 0853 06/17/20 0339 06/21/20 1432  Weight: 81.6 kg 87.9 kg 87.9 kg    Examination: Currently on room air, breathing feels better General: Middle-aged lady, does not have to be in distress at present Neuro: Alert and oriented to person and place HEENT: Moist oral mucosa Cardiovascular: S1-S2 appreciated Lungs: Decreased air movement at the bases bilaterally Abdomen: Bowel sounds appreciated Musculoskeletal:   Left above-knee amputation Skin:   Reviewed current CT scan and previous CT  Assessment & Plan:    RUL apical cavitary lesion -s/p EBUS 12/21 with Dr. 1/22 -- granulomatous inflammation  -CTA chest 1/11 has slight progresison of cavitation + worsening aeration of RUL compared to prior. -was scheduled for PET scan which was not able to get to before she got sick - P -Will likely need repeat invasive study-bronchoscopy/biopsy -PET scan may still be needed prior to invasive intervention  Granulomatous process may be seen in a process like  sarcoidosis Mycobacteria negative in 12/7,12/21 samples    Bilateral pleural effusions -net pos 4.8L this admit  P -Continue to try diuresis  -- takes several antihypertensives, has the BP and renal function to tolerate lasix  -May consider thoracentesis only if not able to diurese safely  Acute left lower extremity ischemia -vvs Infected right femoral graft -vvs Leukocytosis - cefepime, vanc started 1/11 Malnutrition - supplemental nutrition HTN -lotensin, prn hydral, prn labet, prn metop  HLD - lipitor   DM -SSI    Reason for acute decompensation at present likely related to fluid overload, third spacing Continue with diuresis at present  Pulmonary will continue to follow  Best Practice:  Diet:  + Supplemental EN  Pain/Anxiety/Delirium protocol (if indicated):  VAP protocol (if indicated):  DVT prophylaxis: lovenox GI prophylaxis: PPI. Glucose control: SSI. Mobility: Bedrest.   Labs   CBC: Recent Labs  Lab 06/26/20 0548 06/27/20 0805  WBC 14.8* 17.6*  HGB 8.8* 8.1*  HCT 28.1* 24.7*  MCV 90.6 91.1  PLT 268 279   Basic Metabolic Panel: Recent Labs  Lab 06/26/20 0548 06/26/20 1337 06/26/20 1642 06/27/20 0805  NA 136  --   --  137  K 3.6  --   --  3.6  CL 104  --   --  104  CO2 23  --   --  22  GLUCOSE 208*  --   --  240*  BUN 15  --   --  16  CREATININE 0.64  --   --  0.61  CALCIUM 7.2*  --   --  7.2*  MG  --  2.1 2.0 2.1  PHOS  --  2.1* 2.0* 1.9*   GFR: Estimated Creatinine Clearance: 75.9 mL/min (by C-G formula based on SCr of 0.61 mg/dL). Recent Labs  Lab 06/26/20 0548 06/27/20 0805  WBC 14.8* 17.6*   Liver Function Tests: Recent Labs  Lab 06/26/20 0548  AST 70*  ALT 46*  ALKPHOS 90  BILITOT 1.0  PROT 5.5*  ALBUMIN 1.5*   No results for input(s): LIPASE, AMYLASE in the last 168 hours. No results for input(s): AMMONIA in the last 168 hours. ABG    Component Value Date/Time   PHART 7.398 06/16/2020 0608   PCO2ART 24.2 (L)  06/16/2020 0608   PO2ART 128 (H) 06/16/2020 0608   HCO3 15.0 (L) 06/16/2020 0608   TCO2 16 (L) 06/16/2020 0608   ACIDBASEDEF 9.0 (H) 06/16/2020 0608   O2SAT 99.0 06/16/2020 0608    Coagulation Profile: No results for input(s): INR, PROTIME in the last 168 hours. Cardiac Enzymes: No results for input(s): CKTOTAL, CKMB, CKMBINDEX, TROPONINI in the last 168 hours. HbA1C: Hgb A1c MFr Bld  Date/Time Value Ref Range Status  05/11/2020 04:26 AM 7.2 (H) 4.8 - 5.6 % Final    Comment:    (NOTE) Pre diabetes:          5.7%-6.4%  Diabetes:              >6.4%  Glycemic control for   <7.0% adults with diabetes   04/25/2020 05:44 AM 7.3 (H) 4.8 - 5.6 % Final    Comment:    (NOTE) Pre diabetes:          5.7%-6.4%  Diabetes:              >6.4%  Glycemic control for   <7.0% adults with diabetes    CBG: Recent Labs  Lab 06/26/20 1114 06/26/20 1615 06/26/20 1956 06/27/20 0642 06/27/20 1133  GLUCAP 221* 213* 194* 248* 207*    Virl Diamond, MD [facility] Pager: [phone]

## 2020-06-27 NOTE — Significant Event (Signed)
Rapid Response Event Note   Reason for Call :  SOB  Initial Focused Assessment:  Pt sitting on side of bed in respiratory distress, +WOB, +accessory muscle use. Pt is alert and oriented denies chest pain. Lungs with mild wheezing t/o, diminished in the bases. Skin warm/diaphoretic. Pt was placed on 2L [place] prior to my arrival.   Per RN, pt had an episode similar to this around midnight. She said it lasted around 5 min and pt did not require oxygen.   T-99.3, HR-106, SBP-160s, RR-36, SpO2-98% on 2L [place].    Interventions:  Duoneb CTA chest to r/o PE Several attempts made to start PIV for CT scan-IV Team consulted.  Plan of Care:  Vascular MD on way to see pt. Await additional orders.  Pt breathing less labored after breathing tx. Pt maintaining SpO2 on 2L [place]. Pt will need new PIV 20g for CTA chest(IV team consulted), though I'm not sure pt will be able to lay flat for CT scan at this time. If pt breathing better after PIV inserted, may take to CT scan. Please call RRT if further assistance needed.   Event Summary:   MD Notified: Dr. Lenell Antu notified PTA RRT Call [phone] Arrival [phone] End [id number]  Terrilyn Saver, RN

## 2020-06-27 NOTE — Progress Notes (Addendum)
   06/27/20 0621  Vitals  Temp 99.3 F (37.4 C)  Temp Source Oral  BP (!) 186/84  MAP (mmHg) 112  BP Location Right Arm  BP Method Automatic  Patient Position (if appropriate) Lying  Pulse Rate (!) 109  ECG Heart Rate (!) 110  Resp (!) 34  Oxygen Therapy  SpO2 93 %  O2 Device Room Air  Patient woke up from sleep with complaints of SOB, patient denied chest pain feeding pump turned off, lung sounds clear on auscultation, patient requested to sit up, she stated that she feel better sitting up, when she tried to lay back down she again c/o of trouble breathing. Hawken MD notified. Rapid response at bedside, breathing treatment given@0654 ,hydralazine PRN given @0634 .  Patient had a similar episode of SOB that lasted about 5 minutes around midnight, after that episode patient stated that she felt ok, went to sleep and vital signs were stable.

## 2020-06-27 NOTE — Progress Notes (Signed)
   ASSESSMENT & PLAN:  Holly Stevenson is a 70 y.o. female s/p excsision of infected graft material and subsequent L AKA. Open groin wounds. Continue VAC therapy. Slowly improving. Malnutrition. Decline in prealbmin despite aggressive PO supplementation. Place NGT and start TF. Persistent leukocytosis. Trend. Continue Ampicillin. Check UA/CXR/BCx.  Check CTA Chest to r/o PE. Start therapeutic lovenox.   SUBJECTIVE:  New shortness of breath and anxiety today.  OBJECTIVE:  BP 131/65 (BP Location: Right Arm)   Pulse 88   Temp 98.5 F (36.9 C) (Oral)   Resp (!) 31   Ht 5\' 8"  (1.727 m)   Wt 87.9 kg   SpO2 93%   BMI 29.46 kg/m   Intake/Output Summary (Last 24 hours) at 06/27/2020 0750 Last data filed at 06/27/2020 0730 Gross per 24 hour  Intake 1708.92 ml  Output 1676 ml  Net 32.92 ml    Constitutional: well appearing. no acute distress. Cardiac: RRR. Vascular: unchanged groins. Foot warm and well perfused.  CBC Latest Ref Rng & Units 06/26/2020 06/18/2020 06/17/2020  WBC 4.0 - 10.5 K/uL 14.8(H) 14.0(H) 16.1(H)  Hemoglobin 12.0 - 15.0 g/dL 08/15/2020) 1.6(X) 0.9(U)  Hematocrit 36.0 - 46.0 % 28.1(L) 26.3(L) 24.9(L)  Platelets 150 - 400 K/uL 268 77(L) 89(L)     CMP Latest Ref Rng & Units 06/26/2020 06/19/2020 06/17/2020  Glucose 70 - 99 mg/dL 08/15/2020) - 409(W)  BUN 8 - 23 mg/dL 15 - 16  Creatinine 119(J - 1.00 mg/dL 4.78 - 2.95  Sodium 6.21 - 145 mmol/L 136 - 136  Potassium 3.5 - 5.1 mmol/L 3.6 - 3.6  Chloride 98 - 111 mmol/L 104 - 107  CO2 22 - 32 mmol/L 23 - 17(L)  Calcium 8.9 - 10.3 mg/dL 7.2(L) - 7.4(L)  Total Protein 6.5 - 8.1 g/dL 308) 5.3(L) -  Total Bilirubin 0.3 - 1.2 mg/dL 1.0 1.1 -  Alkaline Phos 38 - 126 U/L 90 54 -  AST 15 - 41 U/L 70(H) 107(H) -  ALT 0 - 44 U/L 46(H) 54(H) -    Estimated Creatinine Clearance: 75.9 mL/min (by C-G formula based on SCr of 0.64 mg/dL).  6.5(H. Rande Brunt, MD [facility] Phone Number: [phone] 06/27/2020 7:50 AM

## 2020-06-27 NOTE — Progress Notes (Signed)
Progress Note    06/27/2020 3:27 PM 6 Days Post-Op  Subjective:  Resting comfortably. RR approx 20 bpm with mild increased expiratory effort   Vitals:   06/27/20 0737 06/27/20 1132  BP: 131/65 (!) 151/49  Pulse: 88 81  Resp: (!) 31 (!) 22  Temp: 98.5 F (36.9 C) 99 F (37.2 C)  SpO2: 93% 95%    Physical Exam: Cardiac:  RRR Lungs:  No wheezes  CBC    Component Value Date/Time   WBC 17.6 (H) 06/27/2020 0805   RBC 2.71 (L) 06/27/2020 0805   HGB 8.1 (L) 06/27/2020 0805   HCT 24.7 (L) 06/27/2020 0805   PLT 279 06/27/2020 0805   MCV 91.1 06/27/2020 0805   MCH 29.9 06/27/2020 0805   MCHC 32.8 06/27/2020 0805   RDW 17.3 (H) 06/27/2020 0805   LYMPHSABS 1.3 04/25/2020 0345   MONOABS 1.1 (H) 04/25/2020 0345   EOSABS 0.1 04/25/2020 0345   BASOSABS 0.0 04/25/2020 0345    BMET    Component Value Date/Time   NA 137 06/27/2020 0805   K 3.6 06/27/2020 0805   CL 104 06/27/2020 0805   CO2 22 06/27/2020 0805   GLUCOSE 240 (H) 06/27/2020 0805   BUN 16 06/27/2020 0805   CREATININE 0.61 06/27/2020 0805   CALCIUM 7.2 (L) 06/27/2020 0805   GFRNONAA >60 06/27/2020 0805   GFRAA 52 (L) 02/18/2018 1610     Intake/Output Summary (Last 24 hours) at 06/27/2020 1527 Last data filed at 06/27/2020 0730 Gross per 24 hour  Intake 1708.92 ml  Output 750 ml  Net 958.92 ml    HOSPITAL MEDICATIONS Scheduled Meds: . acetaminophen  650 mg Oral Q6H  . aspirin  81 mg Oral Daily  . atorvastatin  40 mg Oral Daily  . benazepril  40 mg Oral Daily  . Chlorhexidine Gluconate Cloth  6 each Topical Daily  . cholecalciferol  1,000 Units Oral Daily  . docusate  100 mg Oral BID  . [START ON 06/28/2020] enoxaparin (LOVENOX) injection  40 mg Subcutaneous Q24H  . feeding supplement (GLUCERNA SHAKE)  237 mL Oral TID BM  . feeding supplement (OSMOLITE 1.5 CAL)  1,000 mL Per Tube Q24H  . feeding supplement (PROSource TF)  45 mL Per Tube QID  . insulin aspart  0-9 Units Subcutaneous TID WC  .  morphine   Intravenous Q4H  . multivitamin with minerals  1 tablet Oral Daily  . OLANZapine  2.5 mg Oral QHS  . pantoprazole  40 mg Oral Daily  . polyethylene glycol  17 g Oral Daily  . senna-docusate  1 tablet Oral BID   Continuous Infusions: . sodium chloride    . sodium chloride    . ceFEPime (MAXIPIME) IV    . lactated ringers 10 mL/hr at 06/21/20 1431  . magnesium sulfate bolus IVPB    . vancomycin     PRN Meds:.sodium chloride, acetaminophen **OR** acetaminophen, alum & mag hydroxide-simeth, bisacodyl, diphenhydrAMINE **OR** diphenhydrAMINE, guaiFENesin-dextromethorphan, hydrALAZINE, ipratropium-albuterol, labetalol, magnesium sulfate bolus IVPB, metoprolol tartrate, naloxone **AND** sodium chloride flush, ondansetron, phenol, zolpidem  Assessment/Plan: 70 y.o.femaleis s/p excsision of infected graft material and subsequent L AKA(6 Days Post-Op)   SOB this morning; no current respiratory distress No PE on CTA; cavitary process, possible pneumonia, bilateral pleural effusions. CCM/pulmonolgy consulted. Antibiotics broadened: remains on ampicillin and now on vanc and cefepime Remains afebrile. WBC 17.6.  H and H low but stable. No indication for transfusion  -DVT prophylaxis:  Lovenox   Jerusalem Brownstein,  PA-C [facility] [phone] 06/27/2020  3:27 PM

## 2020-06-27 NOTE — Progress Notes (Signed)
PCA pump discontinued per discussion.   Morphine 64ml wasted in stericycle x 2 RN's  Kellsie Grindle/Hanna

## 2020-06-27 NOTE — Progress Notes (Signed)
Progress Note    06/27/2020 7:28 AM 6 Days Post-Op  Subjective:  Experiencing SOB when supine. Started at approximately 0630. CTA ordered, but rapid response team feels she is unable to lay flat for exam. Given nebulizer treatment with improvement in SOB. SaO2 95% on RA. Dr. Lenell Antu aware and Lovenox given just now for presumptive PE.  Attendant RN reports drop in UOP and continued elevated systolic BP. Labs yesterday unremarkable: Hgb 8.8. CXR yesterday not suggestive of volume overload.  Vitals:   06/27/20 0612 06/27/20 0621  BP:  (!) 186/84  Pulse:  (!) 109  Resp: (!) 34 (!) 34  Temp:  99.3 F (37.4 C)  SpO2: 93% 93%    Physical Exam: Cardiac:  RRR Lungs:  CTAB, tachypnic with increased WOB Incisions: Lower leg incisions with serous drainage and no signs of infection. VAC dressing in place with good seal. Left AKASaphenectomy incisions well approximated; no signs of infection Extremities: Left foot with brisk Doppler signals Abdomen:  Soft, ND  CBC    Component Value Date/Time   WBC 14.8 (H) 06/26/2020 0548   RBC 3.10 (L) 06/26/2020 0548   HGB 8.8 (L) 06/26/2020 0548   HCT 28.1 (L) 06/26/2020 0548   PLT 268 06/26/2020 0548   MCV 90.6 06/26/2020 0548   MCH 28.4 06/26/2020 0548   MCHC 31.3 06/26/2020 0548   RDW 17.1 (H) 06/26/2020 0548   LYMPHSABS 1.3 04/25/2020 0345   MONOABS 1.1 (H) 04/25/2020 0345   EOSABS 0.1 04/25/2020 0345   BASOSABS 0.0 04/25/2020 0345    BMET    Component Value Date/Time   NA 136 06/26/2020 0548   K 3.6 06/26/2020 0548   CL 104 06/26/2020 0548   CO2 23 06/26/2020 0548   GLUCOSE 208 (H) 06/26/2020 0548   BUN 15 06/26/2020 0548   CREATININE 0.64 06/26/2020 0548   CALCIUM 7.2 (L) 06/26/2020 0548   GFRNONAA >60 06/26/2020 0548   GFRAA 52 (L) 02/18/2018 1610     Intake/Output Summary (Last 24 hours) at 06/27/2020 0728 Last data filed at 06/27/2020 0400 Gross per 24 hour  Intake 1708.92 ml  Output 926 ml  Net 782.92 ml     HOSPITAL MEDICATIONS Scheduled Meds: . acetaminophen  650 mg Oral Q6H  . aspirin  81 mg Oral Daily  . atorvastatin  40 mg Oral Daily  . benazepril  40 mg Oral Daily  . Chlorhexidine Gluconate Cloth  6 each Topical Daily  . cholecalciferol  1,000 Units Oral Daily  . docusate  100 mg Oral BID  . enoxaparin (LOVENOX) injection  90 mg Subcutaneous Q12H  . feeding supplement (GLUCERNA SHAKE)  237 mL Oral TID BM  . feeding supplement (OSMOLITE 1.5 CAL)  1,000 mL Per Tube Q24H  . feeding supplement (PROSource TF)  45 mL Per Tube QID  . insulin aspart  0-9 Units Subcutaneous TID WC  . morphine   Intravenous Q4H  . multivitamin with minerals  1 tablet Oral Daily  . OLANZapine  2.5 mg Oral QHS  . pantoprazole  40 mg Oral Daily  . polyethylene glycol  17 g Oral Daily  . senna-docusate  1 tablet Oral BID   Continuous Infusions: . sodium chloride    . sodium chloride    . ampicillin (OMNIPEN) IV 2 g (06/27/20 1610)  . lactated ringers 10 mL/hr at 06/21/20 1431  . magnesium sulfate bolus IVPB     PRN Meds:.sodium chloride, acetaminophen **OR** acetaminophen, alum & mag hydroxide-simeth, bisacodyl, diphenhydrAMINE **OR** diphenhydrAMINE,  guaiFENesin-dextromethorphan, hydrALAZINE, ipratropium-albuterol, labetalol, magnesium sulfate bolus IVPB, metoprolol tartrate, naloxone **AND** sodium chloride flush, ondansetron, phenol, zolpidem  Assessment/Plan: 70 y.o. female is s/p excsision of infected graft material and subsequent L AKA (6 Days Post-Op)  SOB R/O PE Monitor UOP RLE well perfused R groin vac change tolerated well at bedside; Continue on MWF schedule; WOC team to assist starting Wednesday 1/12. Antibiotics continue Poor nutrition status: TF with Osmolite 1.5 at 75 mL/hr via Cortrak Continue work with therapy teams   Wendi Maya, PA-C [facility] [phone] 06/27/2020  7:28 AM

## 2020-06-27 NOTE — Significant Event (Addendum)
Rapid Response Note   Rapid response RN handoff occurred at the bedside. Pt remains tachypneic and dyspneic following DuoNeb- per report breathing has improved since assessment prior to neb treatment. Expiratory wheeze heard upon my assessment. Spoke with Kela Millin, PA regarding potential need for low dose IV steroid. Pt stating her breathing is improving although she continues to be tachypneic, shallow breaths and mildly labored. Upon beside evaluation with Dr. Lenell Antu, pt exhibiting continued improvement and she again endorses breathing better. SpO2 98% on 2L[place], RR 30. MD requesting SQ Lovenox be given now for presumed PE. PIV insertion attempted x2 for CTA without success. Awaiting IV team consult for IV access suitable for CTA scan.   RN to call rapid response for additional needs.   MD Notified: Dr. Lenell Antu Arrival Time: 0700 End Time: 0745  0845: Revisited pt prior to transport to CTA scan. Pt intermittently tachypneic. She no longer appears to be in distress. Expiratory wheeze less prominent upon auscultation. Pt educated on what to expect during exam and feels she will be able to lay flat.   Jennye Moccasin, RN

## 2020-06-28 DIAGNOSIS — J90 Pleural effusion, not elsewhere classified: Secondary | ICD-10-CM

## 2020-06-28 LAB — MRSA PCR SCREENING: MRSA by PCR: NEGATIVE

## 2020-06-28 LAB — BASIC METABOLIC PANEL
Anion gap: 11 (ref 5–15)
BUN: 17 mg/dL (ref 8–23)
CO2: 23 mmol/L (ref 22–32)
Calcium: 7.1 mg/dL — ABNORMAL LOW (ref 8.9–10.3)
Chloride: 104 mmol/L (ref 98–111)
Creatinine, Ser: 0.59 mg/dL (ref 0.44–1.00)
GFR, Estimated: >60 mL/min (ref >60)
Glucose, Bld: 289 mg/dL — ABNORMAL HIGH (ref 70–99)
Potassium: 3.3 mmol/L — ABNORMAL LOW (ref 3.5–5.1)
Sodium: 138 mmol/L (ref 135–145)

## 2020-06-28 LAB — GLUCOSE, CAPILLARY
Glucose-Capillary: 270 mg/dL — ABNORMAL HIGH (ref 70–99)
Glucose-Capillary: 198 mg/dL — ABNORMAL HIGH (ref 70–99)
Glucose-Capillary: 239 mg/dL — ABNORMAL HIGH (ref 70–99)
Glucose-Capillary: 252 mg/dL — ABNORMAL HIGH (ref 70–99)

## 2020-06-28 LAB — CBC
HCT: 24.8 % — ABNORMAL LOW (ref 36.0–46.0)
Hemoglobin: 7.9 g/dL — ABNORMAL LOW (ref 12.0–15.0)
MCH: 28.9 pg (ref 26.0–34.0)
MCHC: 31.9 g/dL (ref 30.0–36.0)
MCV: 90.8 fL (ref 80.0–100.0)
Platelets: 251 10*3/uL (ref 150–400)
RBC: 2.73 MIL/uL — ABNORMAL LOW (ref 3.87–5.11)
RDW: 17.5 % — ABNORMAL HIGH (ref 11.5–15.5)
WBC: 15.1 10*3/uL — ABNORMAL HIGH (ref 4.0–10.5)
nRBC: 0 % (ref 0.0–0.2)

## 2020-06-28 MED ORDER — MORPHINE SULFATE (PF) 4 MG/ML IV SOLN
4.0000 mg | INTRAVENOUS | Status: DC | PRN
Start: 2020-06-28 — End: 2020-07-03

## 2020-06-28 MED ORDER — OSMOLITE 1.5 CAL PO LIQD
1000.0000 mL | ORAL | Status: DC
  Administered 2020-06-28 – 2020-07-03 (×6): 1000 mL
  Filled 2020-06-28 (×12): qty 1000

## 2020-06-28 MED ORDER — PROSOURCE TF PO LIQD
45.0000 mL | Freq: Three times a day (TID) | ORAL | Status: DC
  Administered 2020-06-28 – 2020-07-03 (×12): 45 mL
  Filled 2020-06-28 (×17): qty 45

## 2020-06-28 MED ORDER — OXYCODONE HCL 5 MG PO TABS
5.0000 mg | ORAL_TABLET | ORAL | Status: DC | PRN
  Administered 2020-06-28 – 2020-06-30 (×4): 10 mg via ORAL
  Filled 2020-06-28 (×4): qty 2

## 2020-06-28 MED ORDER — FUROSEMIDE 10 MG/ML IJ SOLN
40.0000 mg | Freq: Once | INTRAMUSCULAR | Status: AC
  Administered 2020-06-28: 40 mg via INTRAVENOUS
  Filled 2020-06-28: qty 4

## 2020-06-28 MED ORDER — POTASSIUM CHLORIDE ER 10 MEQ PO TBCR
60.0000 meq | EXTENDED_RELEASE_TABLET | Freq: Once | ORAL | Status: AC
  Administered 2020-06-28: 60 meq via ORAL
  Filled 2020-06-28 (×2): qty 6

## 2020-06-28 MED ORDER — INSULIN GLARGINE 100 UNIT/ML ~~LOC~~ SOLN
10.0000 [IU] | Freq: Every day | SUBCUTANEOUS | Status: DC
  Administered 2020-06-28 – 2020-07-01 (×4): 10 [IU] via SUBCUTANEOUS
  Filled 2020-06-28 (×5): qty 0.1

## 2020-06-28 MED ORDER — OSMOLITE 1.5 CAL PO LIQD
1000.0000 mL | ORAL | Status: DC
  Filled 2020-06-28 (×2): qty 1000

## 2020-06-28 NOTE — Progress Notes (Signed)
Inpatient Diabetes Program Recommendations  AACE/ADA: New Consensus Statement on Inpatient Glycemic Control (2015)  Target Ranges:  Prepandial:   less than 140 mg/dL      Peak postprandial:   less than 180 mg/dL (1-2 hours)      Critically ill patients:  140 - 180 mg/dL   Lab Results  Component Value Date   GLUCAP 270 (H) 06/28/2020   HGBA1C 7.2 (H) 05/11/2020    Review of Glycemic Control Results for Holly Stevenson, Holly Stevenson (MRN [id number]) as of 06/28/2020 09:10  Ref. Range 06/27/2020 06:42 06/27/2020 11:33 06/27/2020 15:44 06/27/2020 20:39 06/28/2020 05:50  Glucose-Capillary Latest Ref Range: 70 - 99 mg/dL 165 (H) 537 (H) 482 (H) 171 (H) 270 (H)   Diabetes history:  DM2 Outpatient Diabetes medications:  Metformin 1000 mg BID Jardiance 25 mg daily Current orders for Inpatient glycemic control:  Novolog 0-9 units tid  Lantus 10 units QHS (starting tonight)  Inpatient Diabetes Program Recommendations:     Noted referral for inpatient DM coordinator for poorly controlled blood sugars.  MD added Lantus 10 units QHS today.  Could start Lantus now and QD thereafter.  Also please consider Novolog 0-5 units QHS.   Will continue to follow while inpatient.  Thank you, Dulce Sellar, RN, BSN Diabetes Coordinator Inpatient Diabetes Program [phone] (team pager from 8a-5p)

## 2020-06-28 NOTE — Progress Notes (Signed)
Patient resting comfortably at this time. Will monitor. [name], Randall An RN

## 2020-06-28 NOTE — Progress Notes (Signed)
Nutrition Follow Up  DOCUMENTATION CODES:   Not applicable  INTERVENTION:   Patient complaining of fullness with change to continuous feeding. She may have difficult time increasing daily intake due to this.   Tube feeding:  -Osmolite 1.5 @ 55 ml/hr via Cortrak (1320 ml) -ProSource TF 45 ml TID  Provides: 2100 kcals, 116 grams protein, 1006 ml free water. Meets 100% of needs.    Glucerna Shake po TID, each supplement provides 220 kcal and 10 grams of protein  MVI daily   May wean tube feeding as PO intake progresses.   NUTRITION DIAGNOSIS:   Increased nutrient needs related to post-op healing as evidenced by estimated needs.  Ongoing  GOAL:   Patient will meet greater than or equal to 90% of their needs   Addressed via TF  MONITOR:   PO intake,Supplement acceptance,Weight trends,Labs,I & O's  REASON FOR ASSESSMENT:   Consult Assessment of nutrition requirement/status  ASSESSMENT:   Patient with PMH significant for DM, HTN, PVD s/p R groin exploration, repair of pseudoaneurysm with interposition grafting, thrombectomy of fem-fem bypass, thrombectomy of L fem-pop bypass. Presents this admission with R groin dehiscence.   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 Meal completions lack documentation over the last few days making it difficult quantify actual intake. Has been taking Glucerna 1-2 times daily.   Pt started on nocturnal feeding previously to allow time to eat during the day. Nocturnal changed to continuous this am per surgery. Do not suspect this will help to improve appetite as pt has been complaining of  ongoing fullness since the change.   Prealbumin is strongly affected by stress response and inflammatory process, therefore, do not expect to see much improvement in this lab value during acute hospitalization.  Admission weight: 81.6 kg (stated) Current weight: 87.9 kg (last taken on 1/5)  UOP: 1750 ml x 24 hrs  Wound VAC (R): 50 ml x 24 hrs   Medications: colace, SS novolog, lantus, miralax, senokot  Labs: K 3.3 (L) Phosphorus 1.9 (L) CBG 171-270  Diet Order            Diet regular Room service appropriate? Yes with Assist; Fluid consistency: Thin  Diet effective now                 EDUCATION NEEDS:   Education needs have been addressed  Skin:  Skin Assessment: Skin Integrity Issues: Skin Integrity Issues:: Incisions,Wound VAC,Stage I,DTI DTI: buttocks Stage II: sacrum Wound Vac: L/R groin Incisions: L leg, R ankle, groin x2  Last BM:  1/11  Height:   Ht Readings from Last 1 Encounters:  06/21/20 5\' 8"  (1.727 m)    Weight:   Wt Readings from Last 1 Encounters:  06/21/20 87.9 kg    BMI:  Body mass index is 29.46 kg/m.  Estimated Nutritional Needs:   Kcal:  2000-2200 kcal  Protein:  100-120 grams  Fluid:  >/= 2 L/day  08/19/20 RD, LDN Clinical Nutrition Pager listed in [facility]

## 2020-06-28 NOTE — Progress Notes (Signed)
Occupational Therapy Treatment Patient Details Name: Holly Stevenson MRN: [id number] DOB: 21-May-1951 Today's Date: 06/28/2020    History of present illness 70 yo admitted 12/29 with right groin dehiscence s/p debridement and graft of fem fem BPG. Additional surgeries 12/30 and 12/31 for endarterectomy and thrombolectomy with resultant LLE BPG occlusion and Rt abdominal hematoma. 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   OT comments  Pt pleasant, session limited this date as wound care arrived for management of wound vac near end of session. Session with focus on desensitization this date to L residual limb including light touch, light massage and tapping for increased tolerance and addressing desensitization. Reviewed 2 BUE exercises to address only shoulder strength in prep for both transfers and self care tasks. Pt tolerated B shoulder flexion and horizontal abduction with sustained brief hold at each end point x2 sets each without resistance or added weight at this time. OT fabricated and provided pt with written directions to increase ability to complete Indep. Able to return demo exercises with no cues provided. Pt will benefit from additional UE strengthening activities in future session. Recommendations for d/c continue to be appropriate at this time.     Follow Up Recommendations  CIR    Equipment Recommendations  3 in 1 bedside commode;Tub/shower bench;Wheelchair (measurements OT);Wheelchair cushion (measurements OT)    Recommendations for Other Services      Precautions / Restrictions Precautions Precautions: Fall;Other (comment) Precaution Comments: bil groin VAC       Mobility Bed Mobility                  Transfers                      Balance                                           ADL either performed or assessed with  clinical judgement   ADL                                         General ADL Comments: OT reviewed and discussed pain and desensitization techniques as pt endorsing occasional phantom limb sensations. receptive and with good carryover of light touch, light/medium pressure and massage and tapping for increased tolerance of input and desensatization.     Vision       Perception     Praxis      Cognition Arousal/Alertness: Awake/alert Behavior During Therapy: WFL for tasks assessed/performed Overall Cognitive Status: Within Functional Limits for tasks assessed Area of Impairment: Memory;Attention                     Memory: Decreased short-term memory         General Comments: A&Ox4.        Exercises Exercises: General Upper Extremity General Exercises - Upper Extremity Shoulder Flexion: AROM;Supine;10 reps;Both;Other (comment) (x2 sets) Shoulder Horizontal ABduction: AROM;10 reps;Supine;Other (comment) (2 sets) Shoulder Horizontal ADduction: AROM;10 reps;Supine;Other (comment) (2 sets)   Shoulder Instructions       General Comments      Pertinent Vitals/ Pain       Pain Assessment: No/denies pain Pain Score: 0-No  pain (endorsing no pain at bed level this date)  Home Living                                          Prior Functioning/Environment              Frequency  Min 2X/week        Progress Toward Goals  OT Goals(current goals can now be found in the care plan section)  Progress towards OT goals: Progressing toward goals  Acute Rehab OT Goals Patient Stated Goal: get better and go home OT Goal Formulation: With patient Time For Goal Achievement: 07/04/20 Potential to Achieve Goals: Good  Plan Discharge plan remains appropriate    Co-evaluation                 AM-PAC OT "6 Clicks" Daily Activity     Outcome Measure   Help from another person eating meals?: None Help from another person  taking care of personal grooming?: A Little Help from another person toileting, which includes using toliet, bedpan, or urinal?: Total Help from another person bathing (including washing, rinsing, drying)?: A Lot Help from another person to put on and taking off regular upper body clothing?: A Little Help from another person to put on and taking off regular lower body clothing?: A Lot 6 Click Score: 15    End of Session    OT Visit Diagnosis: Muscle weakness (generalized) (M62.81);Pain;Other abnormalities of gait and mobility (R26.89) Pain - Right/Left: Left Pain - part of body: Leg   Activity Tolerance Patient tolerated treatment well   Patient Left in bed;with bed alarm set (wound care arrived)   Nurse Communication      Time: 7169-6789 OT Time Calculation (min): 16 min  Charges: OT General Charges $OT Visit: 1 Visit OT Treatments $Therapeutic Activity: 8-22 mins  Von [place]ar OTR/L acute rehab services Office: [phone]  Wilhemena Durie 06/28/2020, 12:45 PM

## 2020-06-28 NOTE — Progress Notes (Signed)
Several times found Osmolite leaking from tube and had to change connector from Cortrak to feeding tube.  Uncertain how much Osmolite went into patient or leaked out.  Will continue to monitor.

## 2020-06-28 NOTE — Consult Note (Signed)
WOC Nurse Consult Note: Reason for Consult: change NPWT groin dressings  Wound type:surgical debridement of bilateral groin wounds 06/22/19 Pressure Injury POA: NA Measurement: Right groin: 5cm x 10cm x 3.5cm with tunnelled area at 6 oclock actively draining lymphatic fluid  Left groin: 2cm x 7cm x 3cm  Wound bed: Right groin; 25% fibrinous material at the medial aspect of the wound bed; otherwise clean and pink  Left groin: 100% pink and clean  Drainage (amount, consistency, odor) Y-connected to one NPWT device; serous; active serous drainage from the right groin Periwound: MARSI (medical adhesive skin injury) from VAC tape or ioban on the right thigh; protected with skin barrier film  Dressing procedure/placement/frequency: Removed old NPWT dressings  Filled wound with  Left groin with 1pc of black foam; filled right groin with 2pc of black foam (one used to wick tunnelled area draining lymphatic fluid) and one use to fill remainder of the defect Sealed NPWT dressing at HG Patient received PO pain medication per bedside nurse prior to dressing change Patient tolerated procedure well   WOC nurse will continue to provide NPWT dressing changed due to the complexity of the dressing change.  2 extra dressings in the room.  Secure chat with PA and MD for possible dressing change on Friday. Discussed with patient and bedside nurse  Jaidence Geisler [facility], CNS, CWON-AP [phone]

## 2020-06-28 NOTE — Progress Notes (Addendum)
Patient with increasing shortness of breath. Patient also is complaining that she is anxious. Patient with expiratory wheezing, sats 95% on room air. Breathing treatment given. 1830  bp 163/86 oxygen placed at 2L sats 97% patient work of breathing has lessened some pt states she is starting to feel better.  Will monitor patient. Tyreik Delahoussaye, Randall An rN

## 2020-06-28 NOTE — Progress Notes (Signed)
NAME:  Holly Stevenson, MRN:  [id number], DOB:  1950-07-26, LOS: 14 ADMISSION DATE:  06/14/2020, CONSULTATION DATE:  06/16/20 REFERRING MD:  Randie Heinz  CHIEF COMPLAINT:  Vent Management   Brief History   Holly Stevenson is a 70 y.o. female who has had multiple vascular surgeries, most recently L common femoral and external iliac endarterectomy with vein patch angioplasty, bilateral common iliac artery stenting, stenting of left external iliac artery, L popliteal artery endarterectomy, L common femoral to above knee popliteal bypass, L lower extremity thromboembolectomy on 12/31.  She was left intubated post op and PCCM asked to assist with vent management.  History of present illness   Pt is encephelopathic; therefore, this HPI is obtained from chart review. Holly Stevenson is a 70 y.o. female who has a PMH as outlined below.  She has had multiple vascular surgeries (see significant events below), most recently L common femoral and external iliac endarterectomy with vein patch angioplasty, bilateral common iliac artery stenting, stenting of left external iliac artery, L popliteal artery endarterectomy, L common femoral to above knee popliteal bypass, L lower extremity thromboembolectomy on 12/31. on 12/31.  She was left intubated post op and PCCM asked to assist with vent management.  Of note, she had navigational bronchoscopy of RUL mass 12/21 with Dr. Delton Coombes.  Path was negative for malignancy but showed granulomas of unclear etiology. Was scheduled for PET scan which she was not able to get done yet.  Past Medical History  has Diabetic infection of left foot (HCC); DM2 (diabetes mellitus, type 2) (HCC); CKD (chronic kidney disease) stage 3, GFR 30-59 ml/min (HCC); HTN (hypertension); Cavitary lesion of lung; Cellulitis of toe of left foot; Idiopathic chronic gout of left knee without tophus; Gangrene of toe of left foot (HCC); PAD (peripheral artery disease) (HCC); Critical lower limb  ischemia (HCC); COPD (chronic obstructive pulmonary disease) (HCC); Diabetic renal disease (HCC); Hyperlipidemia; Personal history of colonic polyps; Polyneuropathy; Tobacco user; Infected prosthetic vascular graft (HCC); Status post surgery; Respiratory failure (HCC); and Pressure injury of skin on their problem list.  Significant Hospital Events   12/16 > exploration of right groin and control of hemorrhage, thrombectomy right to left femoral bypass graft, interposition grafting right common femoral artery to femoral femoral bypass, thrombectomy left femoral popliteal bypass graft, angiogram LLE. 12/21 > EBUS of RUL > granulomas. 12/29 > sharp excisional debridement right groin wound, pulsavac irrigation right groin wound, interposition graft of femorofemoral bypass. 12/30 > Excision of right to left femorofemoral bypass, right common femoral bovine pericardial patch and left femoral to popliteal artery bypass graft with reopening of left groin and left above knee popliteal exposure, harvest of right greater saphenous vein, patch angioplasty of right common femoral artery, left common femoral endarterectomy and saphenous vein angioplasty, left popliteal artery vein angioplasty, aortogram, stent of left common and external iliac veins, stent of L SFA, sartorius muscle flap right groin wound. 12/31 > L common femoral and external iliac endarterectomy with vein patch angioplasty, harvest L greater saphenous vein, aortogram with bilateral iliofemoral runoff,  bilateral common iliac artery stenting, stenting of left external iliac artery, L popliteal artery endarterectomy, L common femoral to above knee popliteal bypass, L lower extremity thromboembolectomy 1/11 Abrupt onset SOB. Improved with neb, sent for CTA chest with suspicion for PE. CTA neg for PE but does show some progression of cavitary lesion, and surrounding decreased aeration. Pulm re-consulted in this setting  Consults:  PCCM  Procedures:    12/21 EBUS  Significant Diagnostic Tests:  CTA chest 1/11- no evidence of PE. R apical cavitary lesion, slight progression from CTA chest 11/9, now with worsening aeration of RUL compared to CTA 11/9. Bilateral pleural effusions, new from prior.    Micro Data:  COVID 12/29 > neg. Right groin wound 12/29 > Few enterococcus faecalis, rare E.coli, Rare bacteroides vulgatus   Antimicrobials:  Vanc 1/11> Cefepime 1/11 >     Interim history/subjective:  Denies any significant complaints at present Respiratory status feels about the same On room air Diuresing well  Objective:  Blood pressure 116/69, pulse 75, temperature 98 F (36.7 C), temperature source Oral, resp. rate 20, height 5\' 8"  (1.727 m), weight 87.9 kg, SpO2 96 %.        Intake/Output Summary (Last 24 hours) at 06/28/2020 1140 Last data filed at 06/28/2020 08/26/2020 Gross per 24 hour  Intake 1100 ml  Output 1650 ml  Net -550 ml   Filed Weights   06/14/20 0853 06/17/20 0339 06/21/20 1432  Weight: 81.6 kg 87.9 kg 87.9 kg    Examination: On room air, breathing feels about the same, comfortable General: Middle-aged lady, does not have to be in distress at present Neuro: Alert and oriented to person and place HEENT: Moist oral mucosa Cardiovascular: S1-S2 appreciated Lungs: Decreased air movement at the bases bilaterally Abdomen: Bowel sounds appreciated Musculoskeletal:   Left above-knee amputation Skin:   Current CT and previous CT were reviewed  Assessment & Plan:   Right upper lobe apical cavitary lesion -S/p EBUS by Dr. 08/19/20 on 12/21 showing granulomatous inflammation -AFB negative x2 -Slightly progressive granulomatous process in the right upper lobe -Bilateral pleural effusions are new  She was supposed to get a PET scan as an outpatient but was not able to perform this prior to getting ill  Patient will likely require invasive study at some point bronchoscopy/biopsy -PET scan may be needed prior to  invasive intervention  Mycobacteria negative on 12/7, 12/21 samples  Bilateral pleural effusions -Diuresing well at present -Respiratory status appears stable  For patient's leukocytosis continues on antibiotics cefepime and vancomycin  Acute lower left extremity ischemia   Malnutrition  Hypertension Hyperlipidemia Diabetes Hypokalemia  Continue aggressive diuresis Monitor electrolytes Replete electrolytes as needed   Bilateral pleural effusions -net pos 4.8L this admit  P -Continue to try diuresis  -- takes several antihypertensives, has the BP and renal function to tolerate lasix  -May consider thoracentesis only if not able to diurese safely  Acute left lower extremity ischemia -vvs Infected right femoral graft -vvs Leukocytosis - cefepime, vanc started 1/11 Malnutrition - supplemental nutrition HTN -lotensin, prn hydral, prn labet, prn metop  HLD - lipitor   DM -SSI    Reason for acute decompensation at present likely related to fluid overload, third spacing Continue with diuresis at present  Pulmonary will continue to follow  3/11, MD [place] PCCM Pager: [phone]

## 2020-06-28 NOTE — Progress Notes (Addendum)
Progress Note    06/28/2020 7:43 AM 7 Days Post-Op  Subjective:  No pain says she is "tired".  Says she had brief episode of breathlessness overnight. No current SOB.   Vitals:   06/27/20 2350 06/28/20 0439  BP: (!) 160/59 (!) 183/59  Pulse: 81   Resp: (!) 25 20  Temp: 98.5 F (36.9 C) 98 F (36.7 C)  SpO2: 97% 96%    Physical Exam: Cardiac:  RRR Lungs:  CTAB Incisions:  BLE incisions are all healing without signs of infection or drainage. VAC dressings in both groins are intact with good seal and clear serous output. Extremities:  Brisk right DP and PT Doppler signals Abdomen:  Soft ,ND CBC    Component Value Date/Time   WBC 15.1 (H) 06/28/2020 0122   RBC 2.73 (L) 06/28/2020 0122   HGB 7.9 (L) 06/28/2020 0122   HCT 24.8 (L) 06/28/2020 0122   PLT 251 06/28/2020 0122   MCV 90.8 06/28/2020 0122   MCH 28.9 06/28/2020 0122   MCHC 31.9 06/28/2020 0122   RDW 17.5 (H) 06/28/2020 0122   LYMPHSABS 1.3 04/25/2020 0345   MONOABS 1.1 (H) 04/25/2020 0345   EOSABS 0.1 04/25/2020 0345   BASOSABS 0.0 04/25/2020 0345    BMET    Component Value Date/Time   NA 138 06/28/2020 0122   K 3.3 (L) 06/28/2020 0122   CL 104 06/28/2020 0122   CO2 23 06/28/2020 0122   GLUCOSE 289 (H) 06/28/2020 0122   BUN 17 06/28/2020 0122   CREATININE 0.59 06/28/2020 0122   CALCIUM 7.1 (L) 06/28/2020 0122   GFRNONAA >60 06/28/2020 0122   GFRAA 52 (L) 02/18/2018 1610     Intake/Output Summary (Last 24 hours) at 06/28/2020 0743 Last data filed at 06/28/2020 0647 Gross per 24 hour  Intake 1100 ml  Output 1050 ml  Net 50 ml    HOSPITAL MEDICATIONS Scheduled Meds: . acetaminophen  650 mg Oral Q6H  . aspirin  81 mg Oral Daily  . atorvastatin  40 mg Oral Daily  . benazepril  40 mg Oral Daily  . Chlorhexidine Gluconate Cloth  6 each Topical Daily  . cholecalciferol  1,000 Units Oral Daily  . docusate  100 mg Oral BID  . enoxaparin (LOVENOX) injection  40 mg Subcutaneous Q24H  . feeding  supplement (GLUCERNA SHAKE)  237 mL Oral TID BM  . feeding supplement (OSMOLITE 1.5 CAL)  1,000 mL Per Tube Q24H  . feeding supplement (PROSource TF)  45 mL Per Tube QID  . furosemide  40 mg Intravenous Once  . insulin aspart  0-9 Units Subcutaneous TID WC  . insulin glargine  10 Units Subcutaneous QHS  . multivitamin with minerals  1 tablet Oral Daily  . OLANZapine  2.5 mg Oral QHS  . pantoprazole  40 mg Oral Daily  . polyethylene glycol  17 g Oral Daily  . senna-docusate  1 tablet Oral BID   Continuous Infusions: . sodium chloride    . ceFEPime (MAXIPIME) IV Stopped (06/28/20 6433)  . magnesium sulfate bolus IVPB    . vancomycin 1,500 mg (06/27/20 1627)   PRN Meds:.alum & mag hydroxide-simeth, bisacodyl, guaiFENesin-dextromethorphan, hydrALAZINE, ipratropium-albuterol, labetalol, magnesium sulfate bolus IVPB, metoprolol tartrate, morphine injection, ondansetron, oxyCODONE, phenol, zolpidem  Assessment/Plan: 70 y.o.femaleis s/p excsision of infected graft material and subsequent L AKA(7 Days Post-Op) Tm 99.3  sys BP remains in the 180s SaO2 96% on RA MEWS score green CBG 190-290s UOP 1800cc BC on 1/10: NG after 2  days  Pneumonia, RUL cavitary process, bilateral pulmonary effusions: no current respiratory distress Antibiotics broadened: remains on ampicillin and now on vanc and cefepime  Remains afebrile. WBC lower at 15.1.  H and H low drift to 7.9. No indication for transfusion  Malnutrition: continue TFs  Open groin wounds: VAC change today  -DVT prophylaxis:  Lovenox   Wendi Maya, PA-C [facility] [phone] 06/28/2020  7:43 AM   VASCULAR STAFF ADDENDUM: I have independently interviewed and examined the patient. I agree with the above.  Continue broad spectrum ABx for cavitary lesion / pneumonia.  Pulmonary assistance greatly appreciated. She is not taking in enough calories. Increase TF to continuous 40cc/h x 24h. Ask nutrition to  evaluate.  Continue local wound care to groins - wound nurse will direct vac changes going forward. Please photodocument wounds if I am not available to evaluate. BG poorly controlled. Added lantus. Ask DM coordinator to evaluate. Continue diuresis with intermittent lasix. Goal net negative >1L daily until closer to euvolemia.   Rande Brunt. Lenell Antu, MD [facility] Phone Number: [phone] 06/28/2020 10:51 AM

## 2020-06-29 DIAGNOSIS — J90 Pleural effusion, not elsewhere classified: Secondary | ICD-10-CM | POA: Diagnosis not present

## 2020-06-29 DIAGNOSIS — A310 Pulmonary mycobacterial infection: Secondary | ICD-10-CM | POA: Diagnosis not present

## 2020-06-29 LAB — CBC
HCT: 26.9 % — ABNORMAL LOW (ref 36.0–46.0)
Hemoglobin: 8.1 g/dL — ABNORMAL LOW (ref 12.0–15.0)
MCH: 27.7 pg (ref 26.0–34.0)
MCHC: 30.1 g/dL (ref 30.0–36.0)
MCV: 92.1 fL (ref 80.0–100.0)
Platelets: 270 10*3/uL (ref 150–400)
RBC: 2.92 MIL/uL — ABNORMAL LOW (ref 3.87–5.11)
RDW: 17.7 % — ABNORMAL HIGH (ref 11.5–15.5)
WBC: 14.1 10*3/uL — ABNORMAL HIGH (ref 4.0–10.5)
nRBC: 0 % (ref 0.0–0.2)

## 2020-06-29 LAB — BASIC METABOLIC PANEL
Anion gap: 11 (ref 5–15)
BUN: 20 mg/dL (ref 8–23)
CO2: 22 mmol/L (ref 22–32)
Calcium: 7.2 mg/dL — ABNORMAL LOW (ref 8.9–10.3)
Chloride: 106 mmol/L (ref 98–111)
Creatinine, Ser: 0.6 mg/dL (ref 0.44–1.00)
GFR, Estimated: >60 mL/min (ref >60)
Glucose, Bld: 258 mg/dL — ABNORMAL HIGH (ref 70–99)
Potassium: 3.9 mmol/L (ref 3.5–5.1)
Sodium: 139 mmol/L (ref 135–145)

## 2020-06-29 LAB — AFB ORGANISM ID BY DNA PROBE
M avium complex: NEGATIVE
M tuberculosis complex: NEGATIVE

## 2020-06-29 LAB — GLUCOSE, CAPILLARY
Glucose-Capillary: 236 mg/dL — ABNORMAL HIGH (ref 70–99)
Glucose-Capillary: 245 mg/dL — ABNORMAL HIGH (ref 70–99)
Glucose-Capillary: 250 mg/dL — ABNORMAL HIGH (ref 70–99)
Glucose-Capillary: 232 mg/dL — ABNORMAL HIGH (ref 70–99)
Glucose-Capillary: 213 mg/dL — ABNORMAL HIGH (ref 70–99)
Glucose-Capillary: 140 mg/dL — ABNORMAL HIGH (ref 70–99)

## 2020-06-29 LAB — ACID FAST CULTURE WITH REFLEXED SENSITIVITIES (MYCOBACTERIA): Acid Fast Culture: POSITIVE — AB

## 2020-06-29 LAB — ORG ID BY SEQUENCING RFLX AST

## 2020-06-29 MED ORDER — FUROSEMIDE 10 MG/ML IJ SOLN
40.0000 mg | Freq: Once | INTRAMUSCULAR | Status: AC
  Administered 2020-06-29: 40 mg via INTRAVENOUS
  Filled 2020-06-29: qty 4

## 2020-06-29 MED ORDER — ISONIAZID 300 MG PO TABS
300.0000 mg | ORAL_TABLET | Freq: Every day | ORAL | Status: DC
  Administered 2020-06-29 – 2020-07-02 (×4): 300 mg via ORAL
  Filled 2020-06-29 (×5): qty 1

## 2020-06-29 MED ORDER — POTASSIUM CHLORIDE CRYS ER 20 MEQ PO TBCR
40.0000 meq | EXTENDED_RELEASE_TABLET | Freq: Once | ORAL | Status: AC
  Administered 2020-06-29: 40 meq via ORAL
  Filled 2020-06-29: qty 2

## 2020-06-29 MED ORDER — RIFAMPIN 300 MG PO CAPS
600.0000 mg | ORAL_CAPSULE | Freq: Every day | ORAL | Status: DC
  Administered 2020-06-29 – 2020-07-02 (×4): 600 mg via ORAL
  Filled 2020-06-29 (×5): qty 2

## 2020-06-29 MED ORDER — ETHAMBUTOL HCL 400 MG PO TABS
1200.0000 mg | ORAL_TABLET | Freq: Every day | ORAL | Status: DC
  Administered 2020-06-29 – 2020-07-02 (×4): 1200 mg via ORAL
  Filled 2020-06-29 (×6): qty 3

## 2020-06-29 MED ORDER — VITAMIN B-6 50 MG PO TABS
50.0000 mg | ORAL_TABLET | Freq: Every day | ORAL | Status: DC
  Administered 2020-06-29 – 2020-07-02 (×4): 50 mg via ORAL
  Filled 2020-06-29 (×5): qty 1

## 2020-06-29 MED ORDER — INSULIN ASPART 100 UNIT/ML ~~LOC~~ SOLN
0.0000 [IU] | Freq: Every day | SUBCUTANEOUS | Status: DC
  Administered 2020-06-29: 2 [IU] via SUBCUTANEOUS
  Administered 2020-06-30 – 2020-07-02 (×3): 3 [IU] via SUBCUTANEOUS
  Administered 2020-07-03 – 2020-07-04 (×2): 2 [IU] via SUBCUTANEOUS

## 2020-06-29 NOTE — Progress Notes (Signed)
Inpatient Diabetes Program Recommendations  AACE/ADA: New Consensus Statement on Inpatient Glycemic Control (2015)  Target Ranges:  Prepandial:   less than 140 mg/dL      Peak postprandial:   less than 180 mg/dL (1-2 hours)      Critically ill patients:  140 - 180 mg/dL   Lab Results  Component Value Date   GLUCAP 250 (H) 06/29/2020   HGBA1C 7.2 (H) 05/11/2020    Review of Glycemic Control Results for Holly Stevenson, Holly Stevenson (MRN [id number]) as of 06/29/2020 13:28  Ref. Range 06/28/2020 05:50 06/28/2020 11:28 06/28/2020 16:33 06/28/2020 20:48 06/29/2020 05:48 06/29/2020 08:21 06/29/2020 13:13  Glucose-Capillary Latest Ref Range: 70 - 99 mg/dL 782 (H) 956 (H) 213 (H) 252 (H) 236 (H) 245 (H) 250 (H)   Diabetes history:  DM2 Outpatient Diabetes medications:  Jardiance 25 mg daily, Metformin 1000 mg bid Current orders for Inpatient glycemic control:  Lantus 10 units daily, Novolog 0-9 units TID & 0-5 units QHS  Inpatient Diabetes Program Recommendations:    Please consider:  Lantus 10 units BID  Will continue to follow while inpatient.  Thank you, Dulce Sellar, RN, BSN Diabetes Coordinator Inpatient Diabetes Program [phone] (team pager from 8a-5p)

## 2020-06-29 NOTE — Progress Notes (Signed)
Physical Therapy Treatment Patient Details Name: Holly Stevenson MRN: [id number] DOB: [birth year] Today's Date: 06/29/2020    History of Present Illness 70 yo admitted 12/29 with right groin dehiscence s/p debridement and graft of fem fem BPG. Additional surgeries 12/30 and 12/31 for endarterectomy and thrombolectomy with resultant LLE BPG occlusion and Rt abdominal hematoma.Bil groin VACs.  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 PMHx: HTN, DM, CKD, gout, PAD, COPD, HLD, polyneuropathy, recent bronchoscopy, 12/16-12/22 multiple vascular surgeries.    PT Comments    Pt admitted with above diagnosis. Pt assisted PT well with scoot transfer to the drop arm recliner today.  Pt needed mod assist of 2 but was assisting a lot and was very motivated to get into chair.  Pt did need to practtice relaxation just after transfer as she did seem to panic initially but calmed down quickly and was happy to eat lunch in chair. Pt met 0/4 goals due to multiple medical issues.  Revised goals today.  Pt currently with functional limitations due to balance and endurance deficits. Pt will benefit from skilled PT to increase their independence and safety with mobility to allow discharge to the venue listed below.     Follow Up Recommendations  CIR;Supervision/Assistance - 24 hour     Equipment Recommendations  Wheelchair (measurements PT);Wheelchair cushion (measurements PT);3in1 (PT);Hospital bed    Recommendations for Other Services       Precautions / Restrictions Precautions Precautions: Fall;Other (comment) Precaution Comments: bil groin VAC Restrictions Weight Bearing Restrictions: No    Mobility  Bed Mobility Overal bed mobility: Needs Assistance Bed Mobility: Rolling;Sidelying to Sit Rolling: Min assist (to the R with rail) Sidelying to sit: Min assist;Mod assist;+2 for physical assistance;HOB elevated       General bed mobility comments: min to mod for pt to come to sit with use of rail. Mod assistance  with use of bed pad to reposition to EOB.  Transfers Overall transfer level: Needs assistance Equipment used: 2 person hand held assist Transfers: Lateral/Scoot Transfers          Lateral/Scoot Transfers: Mod assist;+2 physical assistance;From elevated surface General transfer comment: Assist with pad to scoot to drop arm recliner with mod assist of 2 with pt assisting well by weight shifting forward and with assist of her UEs.  Ambulation/Gait                 Stairs             Wheelchair Mobility    Modified Rankin (Stroke Patients Only)       Balance Overall balance assessment: Needs assistance Sitting-balance support: Feet supported;Bilateral upper extremity supported Sitting balance-Leahy Scale: Poor Sitting balance - Comments: reliant on BUE support                                    Cognition Arousal/Alertness: Awake/alert Behavior During Therapy: WFL for tasks assessed/performed Overall Cognitive Status: Within Functional Limits for tasks assessed Area of Impairment: Memory;Attention                     Memory: Decreased short-term memory         General Comments: A&Ox4.      Exercises General Exercises - Upper Extremity Shoulder Flexion: AROM;Supine;10 reps;Both;Other (comment) (x2 sets) Shoulder Horizontal ABduction: AROM;10 reps;Supine;Other (comment) (2 sets) Shoulder Horizontal ADduction: AROM;10 reps;Supine;Other (comment) (2 sets) General Exercises - Lower Extremity Ankle  Circles/Pumps: AROM;Right;15 reps;Supine Quad Sets: AROM;Right;10 reps;Supine Heel Slides: AROM;Right;10 reps;Supine    General Comments General comments (skin integrity, edema, etc.): VSS on 5L      Pertinent Vitals/Pain Pain Assessment: Faces Faces Pain Scale: Hurts whole lot Pain Location: B LEs. L >R Pain Descriptors / Indicators: Sore;Operative site guarding;Grimacing;Guarding Pain Intervention(s): Limited activity within  patient's tolerance;Monitored during session;Repositioned    Home Living                      Prior Function            PT Goals (current goals can now be found in the care plan section) Acute Rehab PT Goals Patient Stated Goal: get better and go home PT Goal Formulation: With patient/family Time For Goal Achievement: 07/13/20 Potential to Achieve Goals: Fair Progress towards PT goals: Progressing toward goals    Frequency    Min 3X/week      PT Plan Current plan remains appropriate    Co-evaluation              AM-PAC PT "6 Clicks" Mobility   Outcome Measure  Help needed turning from your back to your side while in a flat bed without using bedrails?: A Lot Help needed moving from lying on your back to sitting on the side of a flat bed without using bedrails?: A Lot Help needed moving to and from a bed to a chair (including a wheelchair)?: A Lot Help needed standing up from a chair using your arms (e.g., wheelchair or bedside chair)?: Total Help needed to walk in hospital room?: Total Help needed climbing 3-5 steps with a railing? : Total 6 Click Score: 9    End of Session Equipment Utilized During Treatment: Gait belt;Oxygen (5LO2) Activity Tolerance: Patient limited by pain;Patient limited by fatigue Patient left: with call bell/phone within reach;in chair;with chair alarm set Nurse Communication: Mobility status;Need for lift equipment PT Visit Diagnosis: Unsteadiness on feet (R26.81);Other abnormalities of gait and mobility (R26.89);Muscle weakness (generalized) (M62.81);Difficulty in walking, not elsewhere classified (R26.2);Pain Pain - Right/Left: Left Pain - part of body: Leg     Time: 1135-1205 PT Time Calculation (min) (ACUTE ONLY): 30 min  Charges:  $Therapeutic Exercise: 8-22 mins $Therapeutic Activity: 8-22 mins                     Willo Yoon W,PT Acute Rehabilitation Services Pager:  [phone]  Office:  [place] 06/29/2020, 1:31 PM

## 2020-06-29 NOTE — Progress Notes (Signed)
NAME:  Zohra Clavel, MRN:  [id number], DOB:  Mar 21, 1951, LOS: 15 ADMISSION DATE:  06/14/2020, CONSULTATION DATE:  06/16/20 REFERRING MD:  Randie Heinz  CHIEF COMPLAINT:  Vent Management   Brief History   Holly Stevenson is a 70 y.o. female who has had multiple vascular surgeries, most recently L common femoral and external iliac endarterectomy with vein patch angioplasty, bilateral common iliac artery stenting, stenting of left external iliac artery, L popliteal artery endarterectomy, L common femoral to above knee popliteal bypass, L lower extremity thromboembolectomy on 12/31.  She was left intubated post op and PCCM asked to assist with vent management.  History of present illness   Pt is encephelopathic; therefore, this HPI is obtained from chart review. Anaeli Cornwall is a 70 y.o. female who has a PMH as outlined below.  She has had multiple vascular surgeries (see significant events below), most recently L common femoral and external iliac endarterectomy with vein patch angioplasty, bilateral common iliac artery stenting, stenting of left external iliac artery, L popliteal artery endarterectomy, L common femoral to above knee popliteal bypass, L lower extremity thromboembolectomy on 12/31. on 12/31.  She was left intubated post op and PCCM asked to assist with vent management.  Of note, she had navigational bronchoscopy of RUL mass 12/21 with Dr. Delton Coombes.  Path was negative for malignancy but showed granulomas of unclear etiology. Was scheduled for PET scan which she was not able to get done yet.  Past Medical History  has Diabetic infection of left foot (HCC); DM2 (diabetes mellitus, type 2) (HCC); CKD (chronic kidney disease) stage 3, GFR 30-59 ml/min (HCC); HTN (hypertension); Cavitary lesion of lung; Cellulitis of toe of left foot; Idiopathic chronic gout of left knee without tophus; Gangrene of toe of left foot (HCC); PAD (peripheral artery disease) (HCC); Critical lower limb  ischemia (HCC); COPD (chronic obstructive pulmonary disease) (HCC); Diabetic renal disease (HCC); Hyperlipidemia; Personal history of colonic polyps; Polyneuropathy; Tobacco user; Infected prosthetic vascular graft (HCC); Status post surgery; Respiratory failure (HCC); and Pressure injury of skin on their problem list.  Significant Hospital Events   12/16 > exploration of right groin and control of hemorrhage, thrombectomy right to left femoral bypass graft, interposition grafting right common femoral artery to femoral femoral bypass, thrombectomy left femoral popliteal bypass graft, angiogram LLE. 12/21 > EBUS of RUL > granulomas. 12/29 > sharp excisional debridement right groin wound, pulsavac irrigation right groin wound, interposition graft of femorofemoral bypass. 12/30 > Excision of right to left femorofemoral bypass, right common femoral bovine pericardial patch and left femoral to popliteal artery bypass graft with reopening of left groin and left above knee popliteal exposure, harvest of right greater saphenous vein, patch angioplasty of right common femoral artery, left common femoral endarterectomy and saphenous vein angioplasty, left popliteal artery vein angioplasty, aortogram, stent of left common and external iliac veins, stent of L SFA, sartorius muscle flap right groin wound. 12/31 > L common femoral and external iliac endarterectomy with vein patch angioplasty, harvest L greater saphenous vein, aortogram with bilateral iliofemoral runoff,  bilateral common iliac artery stenting, stenting of left external iliac artery, L popliteal artery endarterectomy, L common femoral to above knee popliteal bypass, L lower extremity thromboembolectomy 1/11 Abrupt onset SOB. Improved with neb, sent for CTA chest with suspicion for PE. CTA neg for PE but does show some progression of cavitary lesion, and surrounding decreased aeration. Pulm re-consulted in this setting  Consults:  PCCM  Procedures:    12/21 EBUS  Significant Diagnostic Tests:  CTA chest 1/11- no evidence of PE. R apical cavitary lesion, slight progression from CTA chest 11/9, now with worsening aeration of RUL compared to CTA 11/9. Bilateral pleural effusions, new from prior.    Micro Data:  COVID 12/29 > neg. 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 rate 20, height 5\' 8"  (1.727 m), weight 87.9 kg, SpO2 96 %.        Intake/Output Summary (Last 24 hours) at 06/29/2020 07/01/2020 Last data filed at 06/29/2020 0555 Gross per 24 hour  Intake 1240 ml  Output 2100 ml  Net -860 ml   Filed Weights   06/14/20 0853 06/17/20 0339 06/21/20 1432  Weight: 81.6 kg 87.9 kg 87.9 kg    Examination: On room air, breathing feels about the same, comfortable General: Middle-age, not in distress Neuro: Alert and oriented to person and place HEENT: Moist oral mucosa Cardiovascular: S1-S2 appreciated Lungs: Decreased air movement at the bases bilaterally Abdomen: Bowel sounds appreciated Musculoskeletal:   Left above-knee amputation Skin: Warm and dry  Current CT and previous CT were reviewed  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 1/22 - mycobacterium [place]ii infection -This is responsible for the progressive CT scan findings  Malnutrition  Hypertension Hyperlipidemia Diabetes Hypokalemia  Continue aggressive diuresis Monitor electrolytes Replete electrolytes as needed  Acute left lower extremity ischemia -vvs Infected right femoral graft -vvs Leukocytosis - cefepime, vanc started 1/11 Malnutrition - supplemental nutrition HTN -lotensin, prn hydral, prn labet, prn metop HLD - lipitor  DM -SSI     3/11, MD [place] PCCM Pager: [phone]

## 2020-06-29 NOTE — Consult Note (Signed)
[facility]       Reason for Consult: M kansasii cavitary pneumonia    Referring Physician: Dr. Wynona Neat  Active Problems:   Infected prosthetic vascular graft [facility]   Status post surgery   Respiratory failure (HCC)   Pressure injury of skin   . acetaminophen  650 mg Oral Q6H  . aspirin  81 mg Oral Daily  . atorvastatin  40 mg Oral Daily  . benazepril  40 mg Oral Daily  . Chlorhexidine Gluconate Cloth  6 each Topical Daily  . cholecalciferol  1,000 Units Oral Daily  . docusate  100 mg Oral BID  . enoxaparin (LOVENOX) injection  40 mg Subcutaneous Q24H  . feeding supplement (GLUCERNA SHAKE)  237 mL Oral TID BM  . feeding supplement (PROSource TF)  45 mL Per Tube TID  . insulin aspart  0-5 Units Subcutaneous QHS  . insulin aspart  0-9 Units Subcutaneous TID WC  . insulin glargine  10 Units Subcutaneous QHS  . multivitamin with minerals  1 tablet Oral Daily  . OLANZapine  2.5 mg Oral QHS  . pantoprazole  40 mg Oral Daily  . polyethylene glycol  17 g Oral Daily  . senna-docusate  1 tablet Oral BID    Recommendations: Start rifampin, ethambutol and INH Will stop cefepime and vancomycin   Assessment: She has a granulomatous cavitary RUL lesion noted on CT from November 2021 and now culture results from EUS done in December c/w M kansasii.   She did grow E coli and Enterococcus and has received antibiotics for this so will stop as above.    Antibiotics: Cefepime, vancomycin  HPI: Holly Stevenson is a 70 y.o. female with a history of DM, CKD, PAD s/p stenting and endarderectomy with fem-pop who has had a right upper lobe cavitary lesion noted on CT done in November 2021.  She underwent bronchoscopy 12/21 and path was negative for malignancy and did note granulomas and now with positive culture growth with M kansasii.   She has required diuresis with bilateral pleural effusions and respiratory cultures with E coli and Enterococcus.  She has  been on vancomycin and cefepime now day 3, ampicillin 1/3-1/11 and vancomycin + pip tazo 12/29-1/2.  She underwent surgical management for critical left lower limb ischemia on 11/24, again on 12/16, with debridement for infection on 12/29, graft removal on 12/30, critical ischemiia again on 12/31, left AKA 1/2 and groin debridement again 1/5.  Currently has a VAC in place bilateral.    Review of Systems:  Constitutional: negative for fevers and chills Gastrointestinal: negative for nausea and diarrhea Integument/breast: negative for rash All other systems reviewed and are negative    Past Medical History:  Diagnosis Date  . Diabetes mellitus without complication (HCC)    Type II  . High cholesterol   . Hypertension   . Pneumonia     Social History   Tobacco Use  . Smoking status: Light Tobacco Smoker    Packs/day: 0.12    Years: 50.00    Pack years: 6.00    Types: Cigarettes  . Smokeless tobacco: Never Used  . Tobacco comment: 5 per a day  Vaping Use  . Vaping Use: Never used  Substance Use Topics  . Alcohol use: Not Currently  . Drug use: Never    Family History  Problem Relation Age of Onset  . Peripheral Artery Disease Neg Hx     No Known Allergies  Physical Exam:  Constitutional: in no apparent distress  Vitals:   06/29/20 0544 06/29/20 0818  BP: (!) 158/57 (!) 149/56  Pulse: 75 95  Resp: 19 20  Temp:  98.5 F (36.9 C)  SpO2: 98% 96%   EYES: anicteric Cardiovascular: Cor RRR Respiratory: clear; some increased respiratory effort but speaking in full sentences, no wheezes GI: soft, nt Musculoskeletal: right groin with VAC in place, no surrounding erythema; left groin with VAC in place, no surrounding erythema, + tenderness to touch of her groin and leg Skin: no rash  Lab Results  Component Value Date   WBC 14.1 (H) 06/29/2020   HGB 8.1 (L) 06/29/2020   HCT 26.9 (L) 06/29/2020   MCV 92.1 06/29/2020   PLT 270 06/29/2020    Lab Results  Component  Value Date   CREATININE 0.60 06/29/2020   BUN 20 06/29/2020   NA 139 06/29/2020   K 3.9 06/29/2020   CL 106 06/29/2020   CO2 22 06/29/2020    Lab Results  Component Value Date   ALT 46 (H) 06/26/2020   AST 70 (H) 06/26/2020   ALKPHOS 90 06/26/2020     Microbiology: Recent Results (from the past 240 hour(s))  Culture, blood (Routine X 2) w Reflex to ID Panel     Status: None (Preliminary result)   Collection Time: 06/26/20 10:45 AM   Specimen: BLOOD LEFT HAND  Result Value Ref Range Status   Specimen Description BLOOD LEFT HAND  Final   Special Requests   Final    BOTTLES DRAWN AEROBIC AND ANAEROBIC Blood Culture results may not be optimal due to an inadequate volume of blood received in culture bottles   Culture   Final    NO GROWTH 3 DAYS Performed at [facility], [address], [place], [place] [address]    Report Status PENDING  Incomplete  Culture, blood (Routine X 2) w Reflex to ID Panel     Status: None (Preliminary result)   Collection Time: 06/26/20 10:48 AM   Specimen: BLOOD RIGHT HAND  Result Value Ref Range Status   Specimen Description BLOOD RIGHT HAND  Final   Special Requests   Final    BOTTLES DRAWN AEROBIC AND ANAEROBIC Blood Culture results may not be optimal due to an inadequate volume of blood received in culture bottles   Culture   Final    NO GROWTH 3 DAYS Performed at [facility], [address], [place], [place] [address]    Report Status PENDING  Incomplete  MRSA PCR Screening     Status: None   Collection Time: 06/28/20 12:12 PM   Specimen: Nasopharyngeal  Result Value Ref Range Status   MRSA by PCR NEGATIVE NEGATIVE Final    Comment:        The GeneXpert MRSA Assay (FDA approved for NASAL specimens only), is one component of a comprehensive MRSA colonization surveillance program. It is not intended to diagnose MRSA infection nor to guide or monitor treatment for MRSA infections. Performed at [facility], [address] [place], [place] [address]     Gardiner Barefoot, MD [facility] [email or web address] 06/29/2020, 11:36 AM

## 2020-06-30 DIAGNOSIS — J9601 Acute respiratory failure with hypoxia: Secondary | ICD-10-CM | POA: Diagnosis not present

## 2020-06-30 DIAGNOSIS — J90 Pleural effusion, not elsewhere classified: Secondary | ICD-10-CM | POA: Diagnosis not present

## 2020-06-30 DIAGNOSIS — J188 Other pneumonia, unspecified organism: Secondary | ICD-10-CM | POA: Diagnosis not present

## 2020-06-30 LAB — BASIC METABOLIC PANEL
Anion gap: 12 (ref 5–15)
BUN: 21 mg/dL (ref 8–23)
CO2: 22 mmol/L (ref 22–32)
Calcium: 7.7 mg/dL — ABNORMAL LOW (ref 8.9–10.3)
Chloride: 103 mmol/L (ref 98–111)
Creatinine, Ser: 0.58 mg/dL (ref 0.44–1.00)
GFR, Estimated: >60 mL/min (ref >60)
Glucose, Bld: 179 mg/dL — ABNORMAL HIGH (ref 70–99)
Potassium: 4.3 mmol/L (ref 3.5–5.1)
Sodium: 137 mmol/L (ref 135–145)

## 2020-06-30 LAB — CBC
HCT: 26.9 % — ABNORMAL LOW (ref 36.0–46.0)
Hemoglobin: 8.1 g/dL — ABNORMAL LOW (ref 12.0–15.0)
MCH: 28.1 pg (ref 26.0–34.0)
MCHC: 30.1 g/dL (ref 30.0–36.0)
MCV: 93.4 fL (ref 80.0–100.0)
Platelets: 244 10*3/uL (ref 150–400)
RBC: 2.88 MIL/uL — ABNORMAL LOW (ref 3.87–5.11)
RDW: 17.6 % — ABNORMAL HIGH (ref 11.5–15.5)
WBC: 11.7 10*3/uL — ABNORMAL HIGH (ref 4.0–10.5)
nRBC: 0 % (ref 0.0–0.2)

## 2020-06-30 LAB — GLUCOSE, CAPILLARY
Glucose-Capillary: 250 mg/dL — ABNORMAL HIGH (ref 70–99)
Glucose-Capillary: 227 mg/dL — ABNORMAL HIGH (ref 70–99)
Glucose-Capillary: 216 mg/dL — ABNORMAL HIGH (ref 70–99)
Glucose-Capillary: 217 mg/dL — ABNORMAL HIGH (ref 70–99)
Glucose-Capillary: 263 mg/dL — ABNORMAL HIGH (ref 70–99)

## 2020-06-30 MED ORDER — LORAZEPAM 2 MG/ML IJ SOLN
0.5000 mg | Freq: Four times a day (QID) | INTRAMUSCULAR | Status: DC | PRN
  Administered 2020-06-30 – 2020-07-02 (×5): 0.5 mg via INTRAVENOUS
  Filled 2020-06-30 (×5): qty 1

## 2020-06-30 MED ORDER — GUAIFENESIN ER 600 MG PO TB12
600.0000 mg | ORAL_TABLET | Freq: Two times a day (BID) | ORAL | Status: DC
  Administered 2020-06-30 – 2020-07-02 (×6): 600 mg via ORAL
  Filled 2020-06-30 (×6): qty 1

## 2020-06-30 MED ORDER — IPRATROPIUM-ALBUTEROL 0.5-2.5 (3) MG/3ML IN SOLN
3.0000 mL | Freq: Four times a day (QID) | RESPIRATORY_TRACT | Status: DC
  Administered 2020-06-30 – 2020-07-06 (×22): 3 mL via RESPIRATORY_TRACT
  Filled 2020-06-30 (×22): qty 3

## 2020-06-30 MED ORDER — FUROSEMIDE 10 MG/ML IJ SOLN
40.0000 mg | Freq: Every day | INTRAMUSCULAR | Status: DC
  Administered 2020-06-30 – 2020-07-03 (×4): 40 mg via INTRAVENOUS
  Filled 2020-06-30 (×4): qty 4

## 2020-06-30 NOTE — Progress Notes (Signed)
Inpatient Rehab Admissions Coordinator:   Met with patient at bedside.  She says her pain is well controlled.  Continue to follow for timing to open insurance.    Shann Medal, PT, DPT Admissions Coordinator [phone] 06/30/20  1:22 PM

## 2020-06-30 NOTE — Progress Notes (Signed)
[facility]   Reason for visit: Follow up on cavitary pneumonia  Interval History: started on three drug therapy.  No rash, no n/v/d.  Remains afebrile, wbc 11.7.   Day 2 Rifampin/ethambutol/INH/B6   Physical Exam: Constitutional:  Vitals:   06/30/20 0315 06/30/20 0926  BP: (!) 170/78   Pulse: 91   Resp: 19   Temp: 98.4 F (36.9 C) 98.2 F (36.8 C)  SpO2: 99%    patient appears in NAD Eyes: anicteric HENT: + nasal cannula Respiratory: normal respiratory effort; CTA B Cardiovascular: tachy RR  Review of Systems: Constitutional: negative for fevers and chills Gastrointestinal: negative for nausea and diarrhea  Lab Results  Component Value Date   WBC 11.7 (H) 06/30/2020   HGB 8.1 (L) 06/30/2020   HCT 26.9 (L) 06/30/2020   MCV 93.4 06/30/2020   PLT 244 06/30/2020    Lab Results  Component Value Date   CREATININE 0.58 06/30/2020   BUN 21 06/30/2020   NA 137 06/30/2020   K 4.3 06/30/2020   CL 103 06/30/2020   CO2 22 06/30/2020    Lab Results  Component Value Date   ALT 46 (H) 06/26/2020   AST 70 (H) 06/26/2020   ALKPHOS 90 06/26/2020     Microbiology: Recent Results (from the past 240 hour(s))  Culture, blood (Routine X 2) w Reflex to ID Panel     Status: None (Preliminary result)   Collection Time: 06/26/20 10:45 AM   Specimen: BLOOD LEFT HAND  Result Value Ref Range Status   Specimen Description BLOOD LEFT HAND  Final   Special Requests   Final    BOTTLES DRAWN AEROBIC AND ANAEROBIC Blood Culture results may not be optimal due to an inadequate volume of blood received in culture bottles   Culture   Final    NO GROWTH 4 DAYS Performed at [facility], [address] [place], [place] [address]    Report Status PENDING  Incomplete  Culture, blood (Routine X 2) w Reflex to ID Panel     Status: None (Preliminary result)   Collection Time: 06/26/20 10:48 AM   Specimen: BLOOD RIGHT HAND  Result Value Ref Range Status    Specimen Description BLOOD RIGHT HAND  Final   Special Requests   Final    BOTTLES DRAWN AEROBIC AND ANAEROBIC Blood Culture results may not be optimal due to an inadequate volume of blood received in culture bottles   Culture   Final    NO GROWTH 4 DAYS Performed at [facility], [address], [place], [place] [address]    Report Status PENDING  Incomplete  MRSA PCR Screening     Status: None   Collection Time: 06/28/20 12:12 PM   Specimen: Nasopharyngeal  Result Value Ref Range Status   MRSA by PCR NEGATIVE NEGATIVE Final    Comment:        The GeneXpert MRSA Assay (FDA approved for NASAL specimens only), is one component of a comprehensive MRSA colonization surveillance program. It is not intended to diagnose MRSA infection nor to guide or monitor treatment for MRSA infections. Performed at [facility], [address], [place], [place] [address]     Impression/Plan:  1. M kansasii cavitary pneumonia - on three drug therapy and tolerating.  Hopefully treatment will show improvement radiographically over the next 2-3 months.  If improving and patient tolerating treatment, will plan on 12-18 months of treatment.  2.  Medication monitoring - will monitor AST and ALT.  Mild elevation at 70/46 most recently.   Checking again on Monday.   Discussed with daughter at bedside

## 2020-06-30 NOTE — Progress Notes (Signed)
Mobility Specialist - Progress Note   06/30/20 1431  Mobility  Activity Transferred:  Bed to chair;Transferred:  Chair to bed  Level of Assistance +2 (takes two people)  Assistive Device None  Mobility Response Tolerated fair  Mobility performed by Mobility specialist;Nurse  $Mobility charge 1 Mobility   Pt +2 mod assist to stand pivot from bed to chair. Once sitting, pt had a BM on herself and was unable to hold a standing position w/ +2 assist or w/ the stedy to be cleaned. She was then pivoted from chair to bed in order to be cleaned by rolling. The seals on both of her wound vacs came unsealed during movement, RN is aware and contacting wound nurse. VSS stable throughout.   Holly Stevenson Mobility Specialist Mobility Specialist Phone: [phone]

## 2020-06-30 NOTE — Progress Notes (Signed)
Pt had an episode off panic attack, , Ativan given as ordered, will continue to monitor.

## 2020-06-30 NOTE — Consult Note (Signed)
WOC Nurse wound follow up Wound type: surgical  Measurement: see Wednesday notes  Wound bed: Right groin: 20% fibrinous material at most medial aspect; 75% pink, pale; continued lymphatic fluid draining at 6 o'clock  Left groin; 100% clean, pale  Drainage (amount, consistency, odor) moderate; serous   Periwound:MARSI (improving right groin)  Dressing procedure/placement/frequency:  Old NPWT dressing removed by VVS staff for assessment   Filled wound with  Right groin with _2__ piece of black foam; Left groin with __2__ piece of black foam  Sealed NPWT dressing at HG Patient received PO pain medication per bedside nurse prior to dressing change Patient tolerated procedure well   WOC nurse will continue to provide NPWT dressing changed due to the complexity of the dressing change.   Kadir Azucena [facility], CNS, [employer] [phone]

## 2020-06-30 NOTE — Progress Notes (Signed)
NAME:  Holly Stevenson, MRN:  [id number], DOB:  1951-02-10, LOS: 16 ADMISSION DATE:  06/14/2020, CONSULTATION DATE:  06/16/20 REFERRING MD:  Randie Heinz  CHIEF COMPLAINT:  Vent Management   Brief History   Holly Stevenson is a 70 y.o. female who has had multiple vascular surgeries, most recently L common femoral and external iliac endarterectomy with vein patch angioplasty, bilateral common iliac artery stenting, stenting of left external iliac artery, L popliteal artery endarterectomy, L common femoral to above knee popliteal bypass, L lower extremity thromboembolectomy on 12/31.  She was left intubated post op and PCCM asked to assist with vent management.  History of present illness   Pt is encephelopathic; therefore, this HPI is obtained from chart review. Holly Stevenson is a 70 y.o. female who has a PMH as outlined below.  She has had multiple vascular surgeries (see significant events below), most recently L common femoral and external iliac endarterectomy with vein patch angioplasty, bilateral common iliac artery stenting, stenting of left external iliac artery, L popliteal artery endarterectomy, L common femoral to above knee popliteal bypass, L lower extremity thromboembolectomy on 12/31. on 12/31.  She was left intubated post op and PCCM asked to assist with vent management.  Of note, she had navigational bronchoscopy of RUL mass 12/21 with Dr. Delton Coombes.  Path was negative for malignancy but showed granulomas of unclear etiology. Was scheduled for PET scan which she was not able to get done yet.  Past Medical History  has Diabetic infection of left foot (HCC); DM2 (diabetes mellitus, type 2) (HCC); CKD (chronic kidney disease) stage 3, GFR 30-59 ml/min (HCC); HTN (hypertension); Cavitary lesion of lung; Cellulitis of toe of left foot; Idiopathic chronic gout of left knee without tophus; Gangrene of toe of left foot (HCC); PAD (peripheral artery disease) (HCC); Critical lower limb  ischemia (HCC); COPD (chronic obstructive pulmonary disease) (HCC); Diabetic renal disease (HCC); Hyperlipidemia; Personal history of colonic polyps; Polyneuropathy; Tobacco user; Infected prosthetic vascular graft (HCC); Status post surgery; Respiratory failure (HCC); and Pressure injury of skin on their problem list.  Significant Hospital Events   12/16 > exploration of right groin and control of hemorrhage, thrombectomy right to left femoral bypass graft, interposition grafting right common femoral artery to femoral femoral bypass, thrombectomy left femoral popliteal bypass graft, angiogram LLE. 12/21 > EBUS of RUL > granulomas. 12/29 > sharp excisional debridement right groin wound, pulsavac irrigation right groin wound, interposition graft of femorofemoral bypass. 12/30 > Excision of right to left femorofemoral bypass, right common femoral bovine pericardial patch and left femoral to popliteal artery bypass graft with reopening of left groin and left above knee popliteal exposure, harvest of right greater saphenous vein, patch angioplasty of right common femoral artery, left common femoral endarterectomy and saphenous vein angioplasty, left popliteal artery vein angioplasty, aortogram, stent of left common and external iliac veins, stent of L SFA, sartorius muscle flap right groin wound. 12/31 > L common femoral and external iliac endarterectomy with vein patch angioplasty, harvest L greater saphenous vein, aortogram with bilateral iliofemoral runoff,  bilateral common iliac artery stenting, stenting of left external iliac artery, L popliteal artery endarterectomy, L common femoral to above knee popliteal bypass, L lower extremity thromboembolectomy 1/11 Abrupt onset SOB. Improved with neb, sent for CTA chest with suspicion for PE. CTA neg for PE but does show some progression of cavitary lesion, and surrounding decreased aeration. Pulm re-consulted in this setting  Consults:  PCCM  Procedures:    12/21 EBUS  Significant Diagnostic Tests:  CTA chest 1/11- no evidence of PE. R apical cavitary lesion, slight progression from CTA chest 11/9, now with worsening aeration of RUL compared to CTA 11/9. Bilateral pleural effusions, new from prior.    Micro Data:  COVID 12/29 > neg. Right groin wound 12/29 > Few enterococcus faecalis, rare E.coli, Rare bacteroides vulgatus   Antimicrobials:  Vanc 1/11-1/12 Cefepime 1/11 -1/12 Lavage from 12/ 7, 12/21-shows Mycobacterium [place]ii Ethambutol 1/13 Isoniazid 1/13 Rifampin 1/13  Interim history/subjective:  Shortness of breath Breathing feels about the same Anxiety  Objective:  Blood pressure (!) 170/78, pulse 91, temperature 98.2 F (36.8 C), temperature source Oral, resp. rate 19, height 5\' 8"  (1.727 m), weight 87.9 kg, SpO2 99 %.        Intake/Output Summary (Last 24 hours) at 06/30/2020 0929 Last data filed at 06/29/2020 2030 Gross per 24 hour  Intake 60 ml  Output 850 ml  Net -790 ml   Filed Weights   06/14/20 0853 06/17/20 0339 06/21/20 1432  Weight: 81.6 kg 87.9 kg 87.9 kg    Examination: On room air, breathing feels about the same, comfortable General: Middle-aged, anxious Neuro: Alert and oriented HEENT: Moist oral mucosa Cardiovascular: S1-S2 appreciated Lungs: Rhonchi bilaterally Abdomen: Bowel sounds appreciated Musculoskeletal:   Left above-knee amputation Skin: Warm and dry  Current CT and previous CT were reviewed  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 10-12-1988 on 12/21 showing granulomatous inflammation -AFB reviewed showing Mycobacterium kansasii  -Progressive infiltration in the right upper lobe -Bilateral pleural effusions are new -Responding to diuresis  Was not able to perform PET scan as outpatient prior to getting ill  Bilateral pleural effusion -We will  continue to diurese -Unlikely related to mycobacterial infection  Appreciate infectious disease input -Started on triple therapy for mycobacterial infection  Malnutrition  Multiple wounds  Diabetes/hypertension/hyperlipidemia  Continue to use incentive spirometer Add flutter device Add Mucinex  Bronchodilators added around-the-clock at present  Lasix 40 daily  1/22, MD [place] [facility] Pager: [phone]

## 2020-06-30 NOTE — Progress Notes (Signed)
  Progress Note    06/30/2020 7:59 AM 9 Days Post-Op  Subjective:  States she is doing okay this morning. Has some SOB "events" during the night. Feels better this morning   Vitals:   06/29/20 1644 06/30/20 0315  BP: (!) 187/60 (!) 170/78  Pulse: 80 91  Resp: (!) 22 19  Temp: 98.4 F (36.9 C) 98.4 F (36.9 C)  SpO2: 100% 99%   Physical Exam: Cardiac: regular Lungs:  Non labored Incisions: RLE staples intact and incisions are healing well. Left AKA staples also intact. Viable flaps. Healing well. No drainage. Bilateral groin wounds as below  Left groin well appearing, Mild serous drainage in wound bed otherwise pink granulation tissue  Right groin. Some slough in wound bed Extremities: Brisk right DP and PT doppler signals Abdomen:  Obese, soft, non tender Neurologic: alert and oriented  CBC    Component Value Date/Time   WBC 11.7 (H) 06/30/2020 0231   RBC 2.88 (L) 06/30/2020 0231   HGB 8.1 (L) 06/30/2020 0231   HCT 26.9 (L) 06/30/2020 0231   PLT 244 06/30/2020 0231   MCV 93.4 06/30/2020 0231   MCH 28.1 06/30/2020 0231   MCHC 30.1 06/30/2020 0231   RDW 17.6 (H) 06/30/2020 0231   LYMPHSABS 1.3 04/25/2020 0345   MONOABS 1.1 (H) 04/25/2020 0345   EOSABS 0.1 04/25/2020 0345   BASOSABS 0.0 04/25/2020 0345    BMET    Component Value Date/Time   NA 137 06/30/2020 0231   K 4.3 06/30/2020 0231   CL 103 06/30/2020 0231   CO2 22 06/30/2020 0231   GLUCOSE 179 (H) 06/30/2020 0231   BUN 21 06/30/2020 0231   CREATININE 0.58 06/30/2020 0231   CALCIUM 7.7 (L) 06/30/2020 0231   GFRNONAA >60 06/30/2020 0231   GFRAA 52 (L) 02/18/2018 1610    INR    Component Value Date/Time   INR 1.0 05/03/2020 1530     Intake/Output Summary (Last 24 hours) at 06/30/2020 0759 Last data filed at 06/29/2020 2030 Gross per 24 hour  Intake 60 ml  Output 850 ml  Net -790 ml     Assessment/Plan:  70 y.o. female is s/p excsision of infected graft material and subsequent L AKA 9 Days  Post-Op. RLE well perfused with brisk doppler signals. BLE incisions healing well. Bilateral groin wounds continue vac. Wounds appearing okay.  Now with cavitary lesion/ pneumonia Appreciate CCM and ID assistance. She has been switched to Rifampin, ethambutol and INH based on cultures WBC 11.7 down from 15 Hemodynamically stable VSS Afebrile Continue TF for malnutrition VAC change by wound care nurse MWF- VACS removed and wet to dry dressings applied. Wound Care nurse to reapply VACs later today  DVT prophylaxis: Lovenox   Graceann Congress, PA-C [facility] [phone] 06/30/2020 7:59 AM

## 2020-07-01 LAB — CBC
HCT: 25.8 % — ABNORMAL LOW (ref 36.0–46.0)
Hemoglobin: 7.6 g/dL — ABNORMAL LOW (ref 12.0–15.0)
MCH: 27.6 pg (ref 26.0–34.0)
MCHC: 29.5 g/dL — ABNORMAL LOW (ref 30.0–36.0)
MCV: 93.8 fL (ref 80.0–100.0)
Platelets: 223 10*3/uL (ref 150–400)
RBC: 2.75 MIL/uL — ABNORMAL LOW (ref 3.87–5.11)
RDW: 17.4 % — ABNORMAL HIGH (ref 11.5–15.5)
WBC: 12.9 10*3/uL — ABNORMAL HIGH (ref 4.0–10.5)
nRBC: 0 % (ref 0.0–0.2)

## 2020-07-01 LAB — BASIC METABOLIC PANEL
Anion gap: 11 (ref 5–15)
BUN: 22 mg/dL (ref 8–23)
CO2: 23 mmol/L (ref 22–32)
Calcium: 7.7 mg/dL — ABNORMAL LOW (ref 8.9–10.3)
Chloride: 102 mmol/L (ref 98–111)
Creatinine, Ser: 0.62 mg/dL (ref 0.44–1.00)
GFR, Estimated: >60 mL/min (ref >60)
Glucose, Bld: 298 mg/dL — ABNORMAL HIGH (ref 70–99)
Potassium: 4.3 mmol/L (ref 3.5–5.1)
Sodium: 136 mmol/L (ref 135–145)

## 2020-07-01 LAB — CULTURE, BLOOD (ROUTINE X 2)
Culture: NO GROWTH
Culture: NO GROWTH

## 2020-07-01 LAB — GLUCOSE, CAPILLARY
Glucose-Capillary: 272 mg/dL — ABNORMAL HIGH (ref 70–99)
Glucose-Capillary: 273 mg/dL — ABNORMAL HIGH (ref 70–99)
Glucose-Capillary: 275 mg/dL — ABNORMAL HIGH (ref 70–99)
Glucose-Capillary: 284 mg/dL — ABNORMAL HIGH (ref 70–99)
Glucose-Capillary: 294 mg/dL — ABNORMAL HIGH (ref 70–99)
Glucose-Capillary: 302 mg/dL — ABNORMAL HIGH (ref 70–99)
Glucose-Capillary: 302 mg/dL — ABNORMAL HIGH (ref 70–99)

## 2020-07-01 NOTE — Progress Notes (Signed)
Mobility Specialist - Progress Note   07/01/20 1459  Mobility  Activity Dangled on edge of bed  Level of Assistance Moderate assist, patient does 50-74%  Assistive Device None  Mobility Response Tolerated fair  Mobility performed by Mobility specialist  $Mobility charge 1 Mobility    Pre-mobility: 88 HR, 97% SpO2 During mobility: 105 HR,96% SpO2 Post-mobility: 85 HR, 95% SpO2  Pt mod assist in order to sit up on edge of bed. Pt began wheezing w/ change of position, SpO2 remained >95% throughout. She was unable to stand w/ the stedy and felt it was best not to slide to the chair. Pt unable to tolerate her bed being in the chair conformation, so she was left in semi-fowler position.   Mamie Levers Mobility Specialist Mobility Specialist Phone: [phone]

## 2020-07-01 NOTE — Progress Notes (Signed)
  Progress Note    07/01/2020 8:56 AM 10 Days Post-Op  Subjective: No new complaints this morning  Vitals:   07/01/20 0500 07/01/20 0756  BP:  (!) 166/76  Pulse:  95  Resp:  17  Temp:  98.2 F (36.8 C)  SpO2: 91% 97%    Physical Exam: Awake alert oriented Mild labored respirations Right groin and left groin wound vacs to seal All right lower extremity incisions intact with staples Right lower extremity incisions including above-knee amputation site intact with staples  CBC    Component Value Date/Time   WBC 12.9 (H) 07/01/2020 0120   RBC 2.75 (L) 07/01/2020 0120   HGB 7.6 (L) 07/01/2020 0120   HCT 25.8 (L) 07/01/2020 0120   PLT 223 07/01/2020 0120   MCV 93.8 07/01/2020 0120   MCH 27.6 07/01/2020 0120   MCHC 29.5 (L) 07/01/2020 0120   RDW 17.4 (H) 07/01/2020 0120   LYMPHSABS 1.3 04/25/2020 0345   MONOABS 1.1 (H) 04/25/2020 0345   EOSABS 0.1 04/25/2020 0345   BASOSABS 0.0 04/25/2020 0345    BMET    Component Value Date/Time   NA 136 07/01/2020 0120   K 4.3 07/01/2020 0120   CL 102 07/01/2020 0120   CO2 23 07/01/2020 0120   GLUCOSE 298 (H) 07/01/2020 0120   BUN 22 07/01/2020 0120   CREATININE 0.62 07/01/2020 0120   CALCIUM 7.7 (L) 07/01/2020 0120   GFRNONAA >60 07/01/2020 0120   GFRAA 52 (L) 02/18/2018 1610    INR    Component Value Date/Time   INR 1.0 05/03/2020 1530     Intake/Output Summary (Last 24 hours) at 07/01/2020 0856 Last data filed at 07/01/2020 0600 Gross per 24 hour  Intake 120 ml  Output 2650 ml  Net -2530 ml     Assessment/Plan:  70 y.o. female is s/p excsision of infected graft material and subsequent L AKA 9 Days Post-Op. RLE well perfused with brisk doppler signals. BLE incisions healing well.  Bilateral groin wounds continue vac. Wounds appearing okay. Now with cavitary lesion/ pneumonia. Appreciate CCM and ID assistance. WBC mostly stable today Continue TF for malnutrition VAC change by wound care nurse MWF currently with  good seal  DVT prophylaxis: Lovenox   Avalon Coppinger C. Randie Heinz, MD [facility] Office: [phone] Pager: [phone]  07/01/2020 8:56 AM

## 2020-07-02 LAB — BASIC METABOLIC PANEL
Anion gap: 9 (ref 5–15)
BUN: 26 mg/dL — ABNORMAL HIGH (ref 8–23)
CO2: 27 mmol/L (ref 22–32)
Calcium: 7.8 mg/dL — ABNORMAL LOW (ref 8.9–10.3)
Chloride: 104 mmol/L (ref 98–111)
Creatinine, Ser: 0.68 mg/dL (ref 0.44–1.00)
GFR, Estimated: >60 mL/min (ref >60)
Glucose, Bld: 278 mg/dL — ABNORMAL HIGH (ref 70–99)
Potassium: 4.5 mmol/L (ref 3.5–5.1)
Sodium: 140 mmol/L (ref 135–145)

## 2020-07-02 LAB — GLUCOSE, CAPILLARY
Glucose-Capillary: 213 mg/dL — ABNORMAL HIGH (ref 70–99)
Glucose-Capillary: 243 mg/dL — ABNORMAL HIGH (ref 70–99)
Glucose-Capillary: 262 mg/dL — ABNORMAL HIGH (ref 70–99)
Glucose-Capillary: 272 mg/dL — ABNORMAL HIGH (ref 70–99)
Glucose-Capillary: 302 mg/dL — ABNORMAL HIGH (ref 70–99)
Glucose-Capillary: 320 mg/dL — ABNORMAL HIGH (ref 70–99)

## 2020-07-02 LAB — CBC
HCT: 25.4 % — ABNORMAL LOW (ref 36.0–46.0)
Hemoglobin: 7.9 g/dL — ABNORMAL LOW (ref 12.0–15.0)
MCH: 28.8 pg (ref 26.0–34.0)
MCHC: 31.1 g/dL (ref 30.0–36.0)
MCV: 92.7 fL (ref 80.0–100.0)
Platelets: 255 10*3/uL (ref 150–400)
RBC: 2.74 MIL/uL — ABNORMAL LOW (ref 3.87–5.11)
RDW: 17.6 % — ABNORMAL HIGH (ref 11.5–15.5)
WBC: 13.9 10*3/uL — ABNORMAL HIGH (ref 4.0–10.5)
nRBC: 0 % (ref 0.0–0.2)

## 2020-07-02 MED ORDER — LORAZEPAM 2 MG/ML IJ SOLN
0.5000 mg | INTRAMUSCULAR | Status: DC | PRN
  Administered 2020-07-02 – 2020-07-03 (×4): 0.5 mg via INTRAVENOUS
  Filled 2020-07-02 (×4): qty 1

## 2020-07-02 MED ORDER — INSULIN GLARGINE 100 UNIT/ML ~~LOC~~ SOLN
10.0000 [IU] | Freq: Two times a day (BID) | SUBCUTANEOUS | Status: DC
  Administered 2020-07-02 – 2020-07-17 (×30): 10 [IU] via SUBCUTANEOUS
  Filled 2020-07-02 (×35): qty 0.1

## 2020-07-02 MED ORDER — TAMSULOSIN HCL 0.4 MG PO CAPS
0.4000 mg | ORAL_CAPSULE | Freq: Every day | ORAL | Status: DC
  Administered 2020-07-02: 0.4 mg via ORAL
  Filled 2020-07-02: qty 1

## 2020-07-02 NOTE — Progress Notes (Addendum)
  Progress Note    07/02/2020 7:50 AM 11 Days Post-Op  Subjective: Very drowsy. Wakes but dozes off quickly. Per Rn was just given Ativan due to increased anxiety. Rn concerned about edema to bilateral lower extremities and L labial swelling   Vitals:   07/01/20 2058 07/02/20 0058  BP:    Pulse:  88  Resp:    Temp:  98.6 F (37 C)  SpO2: 96% 92%   Physical Exam: Cardiac: regular Lungs: non labored Incisions: Right groin and left groin wound VACs with good suction. Right lower extremity incisions clean dry and intact. Left AKA incisions clean, dry and intact Extremities:  Left AKA stump edematous Abdomen:  Obese, some distention, non tender Neurologic: drowsy   CBC    Component Value Date/Time   WBC 13.9 (H) 07/02/2020 0128   RBC 2.74 (L) 07/02/2020 0128   HGB 7.9 (L) 07/02/2020 0128   HCT 25.4 (L) 07/02/2020 0128   PLT 255 07/02/2020 0128   MCV 92.7 07/02/2020 0128   MCH 28.8 07/02/2020 0128   MCHC 31.1 07/02/2020 0128   RDW 17.6 (H) 07/02/2020 0128   LYMPHSABS 1.3 04/25/2020 0345   MONOABS 1.1 (H) 04/25/2020 0345   EOSABS 0.1 04/25/2020 0345   BASOSABS 0.0 04/25/2020 0345    BMET    Component Value Date/Time   NA 140 07/02/2020 0128   K 4.5 07/02/2020 0128   CL 104 07/02/2020 0128   CO2 27 07/02/2020 0128   GLUCOSE 278 (H) 07/02/2020 0128   BUN 26 (H) 07/02/2020 0128   CREATININE 0.68 07/02/2020 0128   CALCIUM 7.8 (L) 07/02/2020 0128   GFRNONAA >60 07/02/2020 0128   GFRAA 52 (L) 02/18/2018 1610    INR    Component Value Date/Time   INR 1.0 05/03/2020 1530     Intake/Output Summary (Last 24 hours) at 07/02/2020 0750 Last data filed at 07/01/2020 1600 Gross per 24 hour  Intake 4855 ml  Output 2275 ml  Net 2580 ml     Assessment/Plan:  70 y.o. female is s/p excsision of infected graft material and subsequent L AKA11 Days Post-Op. RLE well perfused and warm with brisk doppler signals. Bilateral groin wounds with VACS. Wound VAC change tomorrow.  Left AKA edematous but incisions well appearing. With pneumonia. Remains tachypneic on 2L. Appreciate CCM and ID assistance.  WBC stable. Afebrile Continue TF for malnutrition Blood sugar poorly controlled. Changed Lantus to BID per recommendation by Diabetic Coordinater. Family is not helping this with providing donuts and honey buns for patient  Still with foley have added Flomax for urinary retention. will need to take foley out tomorrow to see if she will void on her own. Good UOP Having a lot of anxiety/ panic attacks. Can give ativan q4 PRN   DVT prophylaxis: Lovenox   Graceann Congress, PA-C [facility] [phone] 07/02/2020 7:50 AM   I have independently interviewed and examined patient and agree with PA assessment and plan above.   Libbey Duce C. Randie Heinz, MD [facility] [place] Office: [phone] Pager: [phone]

## 2020-07-02 NOTE — Progress Notes (Addendum)
Pt spitted  morning medications into the cup of ice water. Stated that : "I thought I can get them all in". Administered pt ativan IV. PA aware. Will continue to monitor the pt.   Lawson Radar, RN

## 2020-07-03 ENCOUNTER — Inpatient Hospital Stay (HOSPITAL_COMMUNITY): Payer: BC Managed Care – PPO

## 2020-07-03 DIAGNOSIS — T827XXA Infection and inflammatory reaction due to other cardiac and vascular devices, implants and grafts, initial encounter: Secondary | ICD-10-CM | POA: Diagnosis not present

## 2020-07-03 DIAGNOSIS — J9601 Acute respiratory failure with hypoxia: Secondary | ICD-10-CM | POA: Diagnosis not present

## 2020-07-03 LAB — BASIC METABOLIC PANEL
Anion gap: 9 (ref 5–15)
BUN: 36 mg/dL — ABNORMAL HIGH (ref 8–23)
CO2: 31 mmol/L (ref 22–32)
Calcium: 8.1 mg/dL — ABNORMAL LOW (ref 8.9–10.3)
Chloride: 99 mmol/L (ref 98–111)
Creatinine, Ser: 0.62 mg/dL (ref 0.44–1.00)
GFR, Estimated: >60 mL/min (ref >60)
Glucose, Bld: 292 mg/dL — ABNORMAL HIGH (ref 70–99)
Potassium: 4.7 mmol/L (ref 3.5–5.1)
Sodium: 139 mmol/L (ref 135–145)

## 2020-07-03 LAB — BLOOD GAS, ARTERIAL
Acid-Base Excess: 6.7 mmol/L — ABNORMAL HIGH (ref 0.0–2.0)
Bicarbonate: 32.1 mmol/L — ABNORMAL HIGH (ref 20.0–28.0)
Drawn by: 244901
FIO2: 60
O2 Saturation: 98.4 %
Patient temperature: 37
pCO2 arterial: 59.4 mmHg — ABNORMAL HIGH (ref 32.0–48.0)
pH, Arterial: 7.353 (ref 7.350–7.450)
pO2, Arterial: 125 mmHg — ABNORMAL HIGH (ref 83.0–108.0)

## 2020-07-03 LAB — CBC
HCT: 27.8 % — ABNORMAL LOW (ref 36.0–46.0)
Hemoglobin: 8.3 g/dL — ABNORMAL LOW (ref 12.0–15.0)
MCH: 28.3 pg (ref 26.0–34.0)
MCHC: 29.9 g/dL — ABNORMAL LOW (ref 30.0–36.0)
MCV: 94.9 fL (ref 80.0–100.0)
Platelets: 298 10*3/uL (ref 150–400)
RBC: 2.93 MIL/uL — ABNORMAL LOW (ref 3.87–5.11)
RDW: 17.7 % — ABNORMAL HIGH (ref 11.5–15.5)
WBC: 18 10*3/uL — ABNORMAL HIGH (ref 4.0–10.5)
nRBC: 0.2 % (ref 0.0–0.2)

## 2020-07-03 LAB — HEPATIC FUNCTION PANEL
ALT: 37 U/L (ref 0–44)
AST: 37 U/L (ref 15–41)
Albumin: 1.5 g/dL — ABNORMAL LOW (ref 3.5–5.0)
Alkaline Phosphatase: 116 U/L (ref 38–126)
Bilirubin, Direct: 0.2 mg/dL (ref 0.0–0.2)
Indirect Bilirubin: 0.5 mg/dL (ref 0.3–0.9)
Total Bilirubin: 0.7 mg/dL (ref 0.3–1.2)
Total Protein: 6.4 g/dL — ABNORMAL LOW (ref 6.5–8.1)

## 2020-07-03 LAB — GLUCOSE, CAPILLARY
Glucose-Capillary: 252 mg/dL — ABNORMAL HIGH (ref 70–99)
Glucose-Capillary: 255 mg/dL — ABNORMAL HIGH (ref 70–99)
Glucose-Capillary: 263 mg/dL — ABNORMAL HIGH (ref 70–99)
Glucose-Capillary: 247 mg/dL — ABNORMAL HIGH (ref 70–99)
Glucose-Capillary: 166 mg/dL — ABNORMAL HIGH (ref 70–99)
Glucose-Capillary: 270 mg/dL — ABNORMAL HIGH (ref 70–99)

## 2020-07-03 LAB — PREALBUMIN: Prealbumin: 10.8 mg/dL — ABNORMAL LOW (ref 18–38)

## 2020-07-03 MED ORDER — ACETAMINOPHEN 160 MG/5ML PO SOLN
650.0000 mg | Freq: Four times a day (QID) | ORAL | Status: DC
  Administered 2020-07-03 – 2020-07-06 (×11): 650 mg
  Filled 2020-07-03 (×12): qty 20.3

## 2020-07-03 MED ORDER — BENAZEPRIL HCL 40 MG PO TABS
40.0000 mg | ORAL_TABLET | Freq: Every day | ORAL | Status: DC
  Administered 2020-07-03 – 2020-07-04 (×2): 40 mg
  Filled 2020-07-03 (×2): qty 8

## 2020-07-03 MED ORDER — OXYCODONE HCL 5 MG PO TABS
5.0000 mg | ORAL_TABLET | Freq: Four times a day (QID) | ORAL | Status: DC | PRN
  Administered 2020-07-03: 5 mg via ORAL
  Filled 2020-07-03: qty 1

## 2020-07-03 MED ORDER — ISONIAZID 300 MG PO TABS
300.0000 mg | ORAL_TABLET | Freq: Every day | ORAL | Status: DC
  Administered 2020-07-03 – 2020-07-04 (×2): 300 mg
  Filled 2020-07-03 (×4): qty 1

## 2020-07-03 MED ORDER — SODIUM CHLORIDE 0.9 % IV SOLN
2.0000 g | Freq: Three times a day (TID) | INTRAVENOUS | Status: DC
  Administered 2020-07-03 – 2020-07-06 (×8): 2 g via INTRAVENOUS
  Filled 2020-07-03 (×8): qty 2

## 2020-07-03 MED ORDER — OLANZAPINE 2.5 MG PO TABS
2.5000 mg | ORAL_TABLET | Freq: Every day | ORAL | Status: DC
  Administered 2020-07-03 – 2020-07-05 (×3): 2.5 mg
  Filled 2020-07-03 (×5): qty 1

## 2020-07-03 MED ORDER — POLYETHYLENE GLYCOL 3350 17 G PO PACK: 17.0000 g | PACK | Freq: Every day | ORAL | Status: DC

## 2020-07-03 MED ORDER — VITAMIN D 25 MCG (1000 UNIT) PO TABS
1000.0000 [IU] | ORAL_TABLET | Freq: Every day | ORAL | Status: DC
  Administered 2020-07-03 – 2020-07-17 (×14): 1000 [IU]
  Filled 2020-07-03 (×15): qty 1

## 2020-07-03 MED ORDER — ALUM & MAG HYDROXIDE-SIMETH 200-200-20 MG/5ML PO SUSP: 15.0000 mL | ORAL | Status: DC | PRN

## 2020-07-03 MED ORDER — DOCUSATE SODIUM 50 MG/5ML PO LIQD
100.0000 mg | Freq: Two times a day (BID) | ORAL | Status: DC
  Administered 2020-07-03: 100 mg
  Filled 2020-07-03 (×2): qty 10

## 2020-07-03 MED ORDER — VITAMIN B-6 50 MG PO TABS
50.0000 mg | ORAL_TABLET | Freq: Every day | ORAL | Status: DC
  Administered 2020-07-03 – 2020-07-04 (×2): 50 mg
  Filled 2020-07-03 (×4): qty 1

## 2020-07-03 MED ORDER — ASPIRIN 81 MG PO CHEW
81.0000 mg | CHEWABLE_TABLET | Freq: Every day | ORAL | Status: DC
  Administered 2020-07-03 – 2020-07-04 (×2): 81 mg
  Filled 2020-07-03 (×3): qty 1

## 2020-07-03 MED ORDER — RIFAMPIN ORAL SUSPENSION 25 MG/ML
600.0000 mg | Freq: Every day | ORAL | Status: DC
  Administered 2020-07-03 – 2020-07-04 (×2): 600 mg
  Filled 2020-07-03 (×4): qty 24

## 2020-07-03 MED ORDER — PANTOPRAZOLE SODIUM 40 MG PO PACK
40.0000 mg | PACK | Freq: Every day | ORAL | Status: DC
  Administered 2020-07-03 – 2020-07-04 (×2): 40 mg
  Filled 2020-07-03 (×4): qty 20

## 2020-07-03 MED ORDER — ATORVASTATIN CALCIUM 40 MG PO TABS
40.0000 mg | ORAL_TABLET | Freq: Every day | ORAL | Status: DC
  Administered 2020-07-03 – 2020-07-04 (×2): 40 mg
  Filled 2020-07-03 (×3): qty 1

## 2020-07-03 MED ORDER — FUROSEMIDE 10 MG/ML IJ SOLN
40.0000 mg | Freq: Once | INTRAMUSCULAR | Status: AC
  Administered 2020-07-03: 40 mg via INTRAVENOUS
  Filled 2020-07-03: qty 4

## 2020-07-03 MED ORDER — ADULT MULTIVITAMIN LIQUID CH
15.0000 mL | Freq: Every day | ORAL | Status: DC
  Administered 2020-07-03 – 2020-07-04 (×2): 15 mL
  Filled 2020-07-03 (×4): qty 15

## 2020-07-03 MED ORDER — ETHAMBUTOL HCL 400 MG PO TABS
1200.0000 mg | ORAL_TABLET | Freq: Every day | ORAL | Status: DC
  Administered 2020-07-03 – 2020-07-04 (×2): 1200 mg
  Filled 2020-07-03 (×4): qty 3

## 2020-07-03 MED ORDER — ZOLPIDEM TARTRATE 5 MG PO TABS: 5.0000 mg | ORAL_TABLET | Freq: Every evening | ORAL | Status: DC | PRN

## 2020-07-03 MED ORDER — SENNOSIDES-DOCUSATE SODIUM 8.6-50 MG PO TABS
1.0000 | ORAL_TABLET | Freq: Two times a day (BID) | ORAL | Status: DC
  Filled 2020-07-03 (×2): qty 1

## 2020-07-03 NOTE — Progress Notes (Signed)
PT Cancellation Note  Patient Details Name: Holly Stevenson MRN: [id number] DOB: [birth year]   Cancelled Treatment:    Reason Eval/Treat Not Completed: (P) Medical issues which prohibited therapy (Pt with decline in respiratory function, NP from critical care reports to hold therapies today.  Will f/u per POC.)   Franky Reier Artis Delay 07/03/2020, 2:34 PM  Bonney Leitz , PTA [facility] Services Pager [phone] Office [phone]

## 2020-07-03 NOTE — Progress Notes (Signed)
Results for ADENA, SIMA (MRN [id number]) as of 07/03/2020 13:47  Ref. Range 07/02/2020 20:44 07/03/2020 01:51 07/03/2020 04:18 07/03/2020 08:44 07/03/2020 11:40  Glucose-Capillary Latest Ref Range: 70 - 99 mg/dL 280 (H) 034 (H) 917 (H) 263 (H) 247 (H)  Noted that postprandial CBGs have been greater than 180 mg/dl.  Recommend adding Novolog 3 units TID with meals if patient eats at least 50% of meal and CBGs continue to be elevated.  Smith Mince RN BSN CDE Diabetes Coordinator Pager: [phone]  8am-5pm

## 2020-07-03 NOTE — Progress Notes (Signed)
[facility] PCCM  Not much improvement with BIPAP/lasix See plan of care note, now dnr Will stop tube feedings in case this is aspiration pneumonitis Cefepime added Continue lasix No ICU transfer  Heber , MD [place] [facility] Pager: [phone] Cell: [phone] If no response, call [phone]

## 2020-07-03 NOTE — Progress Notes (Signed)
NAME:  Holly Stevenson, MRN:  [id number], DOB:  Nov 20, 1950, LOS: 19 ADMISSION DATE:  06/14/2020, CONSULTATION DATE:  06/16/20 REFERRING MD:  Randie Heinz  CHIEF COMPLAINT:  Vent Management   Brief History   Holly Stevenson is a 70 y.o. female who has had multiple vascular surgeries, most recently L common femoral and external iliac endarterectomy with vein patch angioplasty, bilateral common iliac artery stenting, stenting of left external iliac artery, L popliteal artery endarterectomy, L common femoral to above knee popliteal bypass, L lower extremity thromboembolectomy on 12/31.  She was left intubated post op and PCCM asked to assist with vent management.  History of present illness   Pt is encephelopathic; therefore, this HPI is obtained from chart review. Holly Stevenson is a 70 y.o. female who has a PMH as outlined below.  She has had multiple vascular surgeries (see significant events below), most recently L common femoral and external iliac endarterectomy with vein patch angioplasty, bilateral common iliac artery stenting, stenting of left external iliac artery, L popliteal artery endarterectomy, L common femoral to above knee popliteal bypass, L lower extremity thromboembolectomy on 12/31. on 12/31.  She was left intubated post op and PCCM asked to assist with vent management.  Of note, she had navigational bronchoscopy of RUL mass 12/21 with Dr. Delton Coombes.  Path was negative for malignancy but showed granulomas of unclear etiology. Was scheduled for PET scan which she was not able to get done yet.  Past Medical History  has Diabetic infection of left foot (HCC); DM2 (diabetes mellitus, type 2) (HCC); CKD (chronic kidney disease) stage 3, GFR 30-59 ml/min (HCC); HTN (hypertension); Cavitary lesion of lung; Cellulitis of toe of left foot; Idiopathic chronic gout of left knee without tophus; Gangrene of toe of left foot (HCC); PAD (peripheral artery disease) (HCC); Critical lower limb  ischemia (HCC); COPD (chronic obstructive pulmonary disease) (HCC); Diabetic renal disease (HCC); Hyperlipidemia; Personal history of colonic polyps; Polyneuropathy; Tobacco user; Infected prosthetic vascular graft (HCC); Status post surgery; Respiratory failure (HCC); and Pressure injury of skin on their problem list.  Significant Hospital Events   12/16 > exploration of right groin and control of hemorrhage, thrombectomy right to left femoral bypass graft, interposition grafting right common femoral artery to femoral femoral bypass, thrombectomy left femoral popliteal bypass graft, angiogram LLE. 12/21 > EBUS of RUL > granulomas. 12/29 > sharp excisional debridement right groin wound, pulsavac irrigation right groin wound, interposition graft of femorofemoral bypass. 12/30 > Excision of right to left femorofemoral bypass, right common femoral bovine pericardial patch and left femoral to popliteal artery bypass graft with reopening of left groin and left above knee popliteal exposure, harvest of right greater saphenous vein, patch angioplasty of right common femoral artery, left common femoral endarterectomy and saphenous vein angioplasty, left popliteal artery vein angioplasty, aortogram, stent of left common and external iliac veins, stent of L SFA, sartorius muscle flap right groin wound. 12/31 > L common femoral and external iliac endarterectomy with vein patch angioplasty, harvest L greater saphenous vein, aortogram with bilateral iliofemoral runoff,  bilateral common iliac artery stenting, stenting of left external iliac artery, L popliteal artery endarterectomy, L common femoral to above knee popliteal bypass, L lower extremity thromboembolectomy 1/11 Abrupt onset SOB. Improved with neb, sent for CTA chest with suspicion for PE. CTA neg for PE but does show some progression of cavitary lesion, and surrounding decreased aeration. Pulm re-consulted in this setting  Consults:  PCCM  Procedures:   12/21 EBUS  Significant Diagnostic Tests:  CTA chest 1/11> no evidence of PE. R apical cavitary lesion, slight progression from CTA chest 11/9, now with worsening aeration of RUL compared to CTA 11/9. Bilateral pleural effusions, new from prior.    Micro Data:  COVID 12/29 > neg. 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 rate (!) 26, height 5\' 8"  (1.727 m), weight 99.3 kg, SpO2 100 %.    FiO2 (%):  [36 %] 36 %   Intake/Output Summary (Last 24 hours) at 07/03/2020 0928 Last data filed at 07/03/2020 0500 Gross per 24 hour  Intake 1975 ml  Output 2525 ml  Net -550 ml   Filed Weights   06/17/20 0339 06/21/20 1432 07/01/20 0409  Weight: 87.9 kg 87.9 kg 99.3 kg    Examination:  General: Chronically ill appearing deconditioned somnolent elderly female lying in bed in NAD HEENT: [place]/AT, MM pink/dry, PERRL,  Neuro: Somnolent, will open eyes to verbal stimuli but unable to carry on conversation as she quickly falls back to sleep CV: s1s2 regular rate and rhythm, no murmur, rubs, or gallops,  PULM:  Bilateral course breath sounds, no on NRB with sats of 100%, tachypnea  GI: soft, bowel sounds active in all 4 quadrants, non-tender, non-distended, tolerating TF Extremities: warm/dry, no edema  Skin: no rashes or lesions  Assessment & Plan:   Right upper lobe apical cavitary lesion -S/p EBUS by Dr. 07/03/20 on 12/21 showing granulomatous inflammation -AFB reviewed showing  Mycobacterium kansasii  -Progressive infiltration in the right upper lobe -Was not able to perform PET scan as outpatient prior to getting ill Acute on chronic hypoxic respiratory failure  -Oxygen requirements have progressively increased she is now on NRB morning of 1/17 -Concerned for oversedation given frequent use of PRN benzo vs hypercapnia  P: Seen by ID for mycobacterial infection, appreciate assistance  Will need prolong treatment course  Obtain ABG now BIPAP as needed Repeat CXR now   Stop all sedating medication  Wean oxygen as able  Antibiotic regiment per ID Encourage pulmonary hygiene  Frequent use of IS and flutter valve Mobilize as able  Continue BDs and Mucinex   Bilateral pleural effusion, new -Unlikely related to mycobacterial infection P: Diurese as tolerate Strict intake and output, currently -7.6L  Rest of acute and chronic comorbidities managed per primary team  Malnutrition  PCCM will continue to follow    2/17, NP-C [facility] Contact / Pager information can be found on Amion  07/03/2020, 9:34 AM

## 2020-07-03 NOTE — Plan of Care (Signed)
  Interdisciplinary Goals of Care Family Meeting   Date carried out:: 07/03/2020  Location of the meeting: Bedside  Member's involved: Physician, Bedside Registered Nurse and Family Member or next of kin  Durable Power of Attorney or acting medical decision maker: Efraim Kaufmann, daughter    Discussion: We discussed goals of care for Brittlyn Cloe .  I explained that her condition has declined today and she has become encephalopathic with worsening hypercarbic respiratory failure now requiring non-invasive mechanical ventilation.  We are treating with lasix, antibiotics and NIMV.  If she had a cardiac or respiratory arrest she would require mechanical ventilation or CPR and at that point would have a <5% chance of surviving to hospital discharge given her advanced comorbid illnesses.  If she survived she would require long term hospitalization such as LTACH then SNF placement.  Efraim Kaufmann states that her mother wouldn't want that.  Code status: Full DNR , continue full medical support but no vasopressors, no ICU transfer  Disposition: Continue current acute care  Time spent for the meeting: 30 minutes  Heber Erlanger 07/03/2020, 3:35 PM

## 2020-07-03 NOTE — Progress Notes (Signed)
Pharmacy Antibiotic Note  Holly Stevenson is a 70 y.o. female admitted on 06/14/2020 with HCAP.  Pharmacy has been consulted for Cefepime dosing.  ID:  abx (rifampin, ethambutol and INH) for M.kansasii PNA, s/p I&D of R groin wound , graft of femorofemoral bypass 12/29. WBC 18 up. ID following. Add Cefepime for HCAP and r/o aspiration pneumonitis  12/29 COVID:  neg 12/29 anaerobic culture> pend  12/29 wound culture> enterococcus faec- panS, e coli panS 1/10 blood x2- neg 1/12 MRSA PCR- neg  Ampicillin 1/3>>1/11 Vanc 12/29>> 1/3 Zosyn 12/29>> 1/3 Cefepime 1/11>>1/13 Vanc 1/11>>1/12 Ethambutol 1/13>> Rifampin 1/13>> Isoniazid 1/13>> Cefepime 1/17>>  Plan: - Cefepime 2g IV q8hr. - Renal function stable. - Pharmacy will sign off the Cefepime consult. Please reconsult for further dosing assitance.    Height: 5\' 8"  (172.7 cm) Weight: 99.3 kg (219 lb) (airbed) IBW/kg (Calculated) : 63.9  Temp (24hrs), Avg:98.7 F (37.1 C), Min:98.4 F (36.9 C), Max:99 F (37.2 C)  Recent Labs  Lab 06/29/20 0104 06/30/20 0231 07/01/20 0120 07/02/20 0128 07/03/20 0455 07/03/20 0953  WBC 14.1* 11.7* 12.9* 13.9*  --  18.0*  CREATININE 0.60 0.58 0.62 0.68 0.62  --     Estimated Creatinine Clearance: 80.7 mL/min (by C-G formula based on SCr of 0.62 mg/dL).    No Known Allergies  Karlena Luebke S. 07/05/20, PharmD, BCPS Clinical Staff Pharmacist [email or web address] Merilynn Finland 07/03/2020 3:54 PM

## 2020-07-03 NOTE — Progress Notes (Addendum)
  Progress Note    07/03/2020 7:41 AM 12 Days Post-Op  Subjective:  Sleeping-awakes to voice  Tm 99 now afebrile HR 70's-90's NSR 150's systolic 100% NRB  Vitals:   07/03/20 0130 07/03/20 0500  BP: (!) 159/70 (!) 152/65  Pulse: 86 74  Resp: (!) 22 (!) 22  Temp: 98.4 F (36.9 C) 98.4 F (36.9 C)  SpO2: 99% 100%    Physical Exam: Cardiac:  regular Lungs:  Non labored on NRB Incisions:  Bilateral groins with wound vacs with good seal; staples in tact right leg and left AKA Extremities:  +doppler signals right AT/PT   CBC    Component Value Date/Time   WBC 13.9 (H) 07/02/2020 0128   RBC 2.74 (L) 07/02/2020 0128   HGB 7.9 (L) 07/02/2020 0128   HCT 25.4 (L) 07/02/2020 0128   PLT 255 07/02/2020 0128   MCV 92.7 07/02/2020 0128   MCH 28.8 07/02/2020 0128   MCHC 31.1 07/02/2020 0128   RDW 17.6 (H) 07/02/2020 0128   LYMPHSABS 1.3 04/25/2020 0345   MONOABS 1.1 (H) 04/25/2020 0345   EOSABS 0.1 04/25/2020 0345   BASOSABS 0.0 04/25/2020 0345    BMET    Component Value Date/Time   NA 139 07/03/2020 0455   K 4.7 07/03/2020 0455   CL 99 07/03/2020 0455   CO2 31 07/03/2020 0455   GLUCOSE 292 (H) 07/03/2020 0455   BUN 36 (H) 07/03/2020 0455   CREATININE 0.62 07/03/2020 0455   CALCIUM 8.1 (L) 07/03/2020 0455   GFRNONAA >60 07/03/2020 0455   GFRAA 52 (L) 02/18/2018 1610    INR    Component Value Date/Time   INR 1.0 05/03/2020 1530     Intake/Output Summary (Last 24 hours) at 07/03/2020 0741 Last data filed at 07/03/2020 0500 Gross per 24 hour  Intake 2215 ml  Output 2525 ml  Net -310 ml     Assessment:  70 y.o. female is s/p:  Excision of infected graft material and muscle flap and subsequent L AKA 12 Days Post-Op  Plan: -wound vacs to both groins-to be changed today.   -has doppler signals right PT/AT; staples right leg in tact -pt with M kansasii cavitary pna-on three drug therapy and tolerating per ID.  Will need 12-18 months of treatment.   Monitoring AST/ALT per ID and these have improved.  -pt 100% on NRB -blood sugars still need better control-appreciate recommendations. -malnutrition-receiving TF's -DVT prophylaxis:  Lovenox   Doreatha Massed, PA-C [facility] [phone] 07/03/2020 7:41 AM  VASCULAR STAFF ADDENDUM: I have independently interviewed and examined the patient. I agree with the above.  Slow improvement. Reports anxiety. Nutrition improving. Prealbumin 8--> 10. Albumin 1.5. Continue TF. Continue LWC to groins with VAC therapy.  Rande Brunt. Lenell Antu, MD [facility] Phone Number: [phone] 07/03/2020 8:14 AM

## 2020-07-03 NOTE — Progress Notes (Signed)
[facility]    Date of Admission:  06/14/2020   Total days of antibiotics 20/ day 4 of RIF,INH, EMB           ID: Holly Stevenson is a 70 y.o. 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 This morning slow to answer questions while wearing NRB.  Afebrile but WBC elevated from 13 to 18K.  Medications:  . acetaminophen (TYLENOL) oral liquid 160 mg/5 mL  650 mg Per Tube Q6H  . acetaminophen  650 mg Oral Q6H  . aspirin  81 mg Per Tube Daily  . atorvastatin  40 mg Per Tube Daily  . benazepril  40 mg Per Tube Daily  . Chlorhexidine Gluconate Cloth  6 each Topical Daily  . cholecalciferol  1,000 Units Per Tube Daily  . docusate  100 mg Per Tube BID  . enoxaparin (LOVENOX) injection  40 mg Subcutaneous Q24H  . ethambutol  1,200 mg Per Tube Daily  . feeding supplement (GLUCERNA SHAKE)  237 mL Oral TID BM  . feeding supplement (PROSource TF)  45 mL Per Tube TID  . furosemide  40 mg Intravenous Daily  . furosemide  40 mg Intravenous Once  . insulin aspart  0-5 Units Subcutaneous QHS  . insulin aspart  0-9 Units Subcutaneous TID WC  . insulin glargine  10 Units Subcutaneous BID  . ipratropium-albuterol  3 mL Nebulization QID  . isoniazid  300 mg Per Tube Daily  . multivitamin  15 mL Per Tube Daily  . OLANZapine  2.5 mg Per Tube QHS  . pantoprazole sodium  40 mg Per Tube  Daily  . [START ON 07/04/2020] polyethylene glycol  17 g Per Tube Daily  . vitamin B-6  50 mg Per Tube Daily  . rifampin  600 mg Per Tube Daily  . senna-docusate  1 tablet Per Tube BID    Objective: Vital signs in last 24 hours: Temp:  [98.4 F (36.9 C)-99 F (37.2 C)] 98.7 F (37.1 C) (01/17 0813) Pulse Rate:  [69-95] 78 (01/17 0813) Resp:  [20-26] 26 (01/17 0813) BP: (148-176)/(55-70) 165/66 (01/17 0813) SpO2:  [91 %-100 %] 100 % (01/17 1107) Physical Exam  Constitutional:  Opens eyes to verbal stimuli. appears chronically ill and well-nourished. Labored breathing HENT: /AT, PERRLA, no scleral icterus Mouth/Throat: Oropharynx is clear and moist. No oropharyngeal exudate.  Cardiovascular: Normal rate, regular rhythm and normal heart sounds. Exam reveals no gallop and no friction rub.  No murmur heard.  Pulmonary/Chest: tachypnic, bilateral rhonchi Neck = supple, no nuchal rigidity Abdominal: Soft. Bowel sounds are normal.  exhibits no distension. There is no tenderness.  Groin = right groin wound vac intact GU= foley in place Ext = left BKA staples are intact. No drainage, small eschar to incision of stump. Right leg staples are c/d/i  Lab Results Recent Labs    07/02/20 0128 07/03/20 0455 07/03/20 0953  WBC 13.9*  --  18.0*  HGB 7.9*  --  8.3*  HCT 25.4*  --  27.8*  NA 140 139  --   K 4.5 4.7  --   CL 104 99  --   CO2 27 31  --   BUN 26* 36*  --   CREATININE 0.68 0.62  --    Liver Panel Recent Labs    07/03/20 0455  PROT 6.4*  ALBUMIN 1.5*  AST 37  ALT 37  ALKPHOS 116  BILITOT 0.7  BILIDIR 0.2  IBILI 0.5   Sedimentation Rate No results for input(s): ESRSEDRATE in the last 72 hours. C-Reactive Protein No results for input(s): CRP in the last 72 hours.  Microbiology: Right groin wound 12/29 > Few enterococcus faecalis, rare E.coli, Rare bacteroides vulgatus   12/21  Component 3 wk ago  Organism ID by Sequencing CommentAbnormal   Comment: (NOTE)   Mycobacterium kansasii       Studies/Results: DG CHEST PORT 1 VIEW  Result Date: 07/03/2020 CLINICAL DATA:  Respiratory failure with increasing hypoxia and oxygen requirement. EXAM: PORTABLE CHEST 1 VIEW COMPARISON:  06/26/2020 and CTA of the chest on 06/27/2020. FINDINGS: Stable heart size. Significant worsening diffuse alveolar airspace disease throughout both lungs with probable component of bilateral pleural effusions. No pneumothorax. IMPRESSION: Significant worsening of diffuse alveolar airspace disease throughout both lungs with probable component of bilateral pleural effusions. Electronically Signed   By: Irish Lack M.D.   On: 07/03/2020 10:41   Assessment/Plan: Respiratory distress =agree with getting abg, cxr ,and chest CTA . cxr suggests bilateral pleural effusion and may benefit from diuresis  Leukocytosis= unclear if related to new underlying infection vs stress reaction. Will repeat cbc in the morning. No change for abtx unless clinically worsens  M.kansasii pulmonary infection = continue with rifampin, inh, and emb, and vit b6  Altered mental status = avoiding any sedating medication to see if clears her sensorium  Vascular graft infection of right groin = had finished treatment, continue to monitor  [facility] Cell: [phone] Pager: [phone]  07/03/2020, 11:40 AM

## 2020-07-03 NOTE — Progress Notes (Signed)
OT Cancellation Note  Patient Details Name: Holly Stevenson MRN: [id number] DOB: [birth year]   Cancelled Treatment:    Reason Eval/Treat Not Completed: Medical issues which prohibited therapy;Other (comment) Per chart review and communication with PTA, pt with decline in respiratory function, NP from critical care reports to hold therapies today.  Will continue efforts as pt medically stable.   Pollyann Glen K., COTA/L Acute Rehabilitation Services [phone]   Barron Schmid 07/03/2020, 2:41 PM

## 2020-07-03 NOTE — Consult Note (Signed)
WOC Nurse wound follow up Patient receiving care in 4E4. Wound type: Bilateral groin VACs changed this morning by surgical team. Measurement: Wound bed: Drainage (amount, consistency, odor)  Periwound: Dressing procedure/placement/frequency: VAC dressing supplies at nurses station for change on Wednesday. Helmut Muster, RN, MSN, CWOCN, CNS-BC, pager [phone]

## 2020-07-04 LAB — GLUCOSE, CAPILLARY
Glucose-Capillary: 261 mg/dL — ABNORMAL HIGH (ref 70–99)
Glucose-Capillary: 223 mg/dL — ABNORMAL HIGH (ref 70–99)
Glucose-Capillary: 220 mg/dL — ABNORMAL HIGH (ref 70–99)
Glucose-Capillary: 192 mg/dL — ABNORMAL HIGH (ref 70–99)
Glucose-Capillary: 227 mg/dL — ABNORMAL HIGH (ref 70–99)

## 2020-07-04 LAB — CBC
HCT: 22.6 % — ABNORMAL LOW (ref 36.0–46.0)
Hemoglobin: 7 g/dL — ABNORMAL LOW (ref 12.0–15.0)
MCH: 28.7 pg (ref 26.0–34.0)
MCHC: 31 g/dL (ref 30.0–36.0)
MCV: 92.6 fL (ref 80.0–100.0)
Platelets: 237 10*3/uL (ref 150–400)
RBC: 2.44 MIL/uL — ABNORMAL LOW (ref 3.87–5.11)
RDW: 17.9 % — ABNORMAL HIGH (ref 11.5–15.5)
WBC: 11.2 10*3/uL — ABNORMAL HIGH (ref 4.0–10.5)
nRBC: 0 % (ref 0.0–0.2)

## 2020-07-04 LAB — BASIC METABOLIC PANEL
Anion gap: 9 (ref 5–15)
BUN: 36 mg/dL — ABNORMAL HIGH (ref 8–23)
CO2: 31 mmol/L (ref 22–32)
Calcium: 7.6 mg/dL — ABNORMAL LOW (ref 8.9–10.3)
Chloride: 98 mmol/L (ref 98–111)
Creatinine, Ser: 0.56 mg/dL (ref 0.44–1.00)
GFR, Estimated: >60 mL/min (ref >60)
Glucose, Bld: 244 mg/dL — ABNORMAL HIGH (ref 70–99)
Potassium: 4.1 mmol/L (ref 3.5–5.1)
Sodium: 138 mmol/L (ref 135–145)

## 2020-07-04 MED ORDER — OXYCODONE HCL 5 MG PO TABS
5.0000 mg | ORAL_TABLET | Freq: Four times a day (QID) | ORAL | Status: DC | PRN
  Administered 2020-07-04 – 2020-07-05 (×2): 5 mg
  Filled 2020-07-04 (×2): qty 1

## 2020-07-04 MED ORDER — FUROSEMIDE 10 MG/ML IJ SOLN
20.0000 mg | Freq: Four times a day (QID) | INTRAMUSCULAR | Status: DC
  Administered 2020-07-04 – 2020-07-05 (×4): 20 mg via INTRAVENOUS
  Filled 2020-07-04 (×4): qty 2

## 2020-07-04 NOTE — Progress Notes (Signed)
Physical Therapy Treatment Patient Details Name: Holly Stevenson MRN: [id number] DOB: 03/22/1951 Today's Date: 07/04/2020    History of Present Illness 70 yo admitted 12/29 with right groin dehiscence s/p debridement and graft of fem fem BPG. Additional surgeries 12/30 and 12/31 for endarterectomy and thrombolectomy with resultant LLE BPG occlusion and Rt abdominal hematoma.Bil groin VACs.  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 PMHx: HTN, DM, CKD, gout, PAD, COPD, HLD, polyneuropathy, recent bronchoscopy, 12/16-12/22 multiple vascular surgeries.    PT Comments    Pt recently placed on BiPAP for comfort due to SoB despite SaO2 in 90%. Pt agreeable to therapy and able to come to EoB with maxAx2 and sat for 5 min working on breathing. Once pt more comfortable, able to convince her to stand in [place]. Pt with 1 bout of standing for 1 min. Pt reports fatigue afterwards and returns to bed with maxAx2. With encouragement and management of anxiety due to [facility], PT feels d/c plans remain appropriate. PT will continue to follow acutely.     Follow Up Recommendations  CIR;Supervision/Assistance - 24 hour     Equipment Recommendations  Wheelchair (measurements PT);Wheelchair cushion (measurements PT);3in1 (PT);Hospital bed       Precautions / Restrictions Precautions Precautions: Fall;Other (comment) Precaution Comments: bil groin VAC, Cortrak, monitor O2 Restrictions Weight Bearing Restrictions: Yes LLE Weight Bearing: Non weight bearing    Mobility  Bed Mobility Overal bed mobility: Needs Assistance Bed Mobility: Rolling;Supine to Sit;Sit to Supine Rolling: Min assist   Supine to sit: Max assist;+2 for physical assistance;+2 for safety/equipment;HOB elevated Sit to supine: Max assist;+2 for physical assistance;+2 for safety/equipment   General bed mobility comments: Max A x 2 for advancement to EOB. Able to use B UE on bedrail to assist but continues to require heavy assist for trunk advancement and  scooting hips to EOB safely. Pt able to roll to L side with Min A and bedrail use for adjustment of bed pad  Transfers Overall transfer level: Needs assistance Equipment used: Ambulation equipment used Transfers: Sit to/from Stand Sit to Stand: Mod assist;+2 physical assistance;+2 safety/equipment;From elevated surface         General transfer comment: Mod A x 2 for sit to stand from elevated bed. Pt able to use UE strength to assist and maintained standing in [place] for one minute before rest break needed and reports feeling too drained to attempt again        Balance Overall balance assessment: Needs assistance Sitting-balance support: Feet supported;Single extremity supported Sitting balance-Leahy Scale: Poor Sitting balance - Comments: reliant on at least one UE Support EOB. Able to hold sitting balance without support for <20 seconds but due to sensation of falling, pt preference for one UE support on bedrail   Standing balance support: Bilateral upper extremity supported Standing balance-Leahy Scale: Poor Standing balance comment: reliant on UE support in [place] to maintain balance                            Cognition Arousal/Alertness: Awake/alert Behavior During Therapy: WFL for tasks assessed/performed Overall Cognitive Status: Within Functional Limits for tasks assessed                                 General Comments: A&Ox4, follows commands well. Noted with continued anxiety and reports sensation of feeling like she is falling while sitting EOB  General Comments General comments (skin integrity, edema, etc.): Pt on BiPAP 40%O2, SaO2 100%O2 throughout session, however pt continues to c/o SOB, provided increased cuing for slowed, deep breathing.      Pertinent Vitals/Pain Pain Assessment: 0-10 Pain Score: 7  Pain Location: L residual limb Pain Descriptors / Indicators: Sore;Operative site guarding;Grimacing;Guarding Pain  Intervention(s): Limited activity within patient's tolerance;Monitored during session;Repositioned;Other (comment) (RN notified)           PT Goals (current goals can now be found in the care plan section) Acute Rehab PT Goals Patient Stated Goal: get better and go home PT Goal Formulation: With patient/family Time For Goal Achievement: 07/13/20 Potential to Achieve Goals: Fair Progress towards PT goals: Progressing toward goals    Frequency    Min 3X/week      PT Plan Current plan remains appropriate    Co-evaluation PT/OT/SLP Co-Evaluation/Treatment: Yes Reason for Co-Treatment: Complexity of the patient's impairments (multi-system involvement) PT goals addressed during session: Mobility/safety with mobility OT goals addressed during session: Strengthening/ROM      AM-PAC PT "6 Clicks" Mobility   Outcome Measure  Help needed turning from your back to your side while in a flat bed without using bedrails?: A Lot Help needed moving from lying on your back to sitting on the side of a flat bed without using bedrails?: A Lot Help needed moving to and from a bed to a chair (including a wheelchair)?: A Lot Help needed standing up from a chair using your arms (e.g., wheelchair or bedside chair)?: A Lot Help needed to walk in hospital room?: A Lot Help needed climbing 3-5 steps with a railing? : Total 6 Click Score: 11    End of Session Equipment Utilized During Treatment: Gait belt;Oxygen Activity Tolerance: Patient limited by fatigue Patient left: in bed;with call bell/phone within reach;with bed alarm set Nurse Communication: Mobility status;Need for lift equipment PT Visit Diagnosis: Unsteadiness on feet (R26.81);Other abnormalities of gait and mobility (R26.89);Muscle weakness (generalized) (M62.81);Difficulty in walking, not elsewhere classified (R26.2);Pain Pain - Right/Left: Left Pain - part of body: Leg     Time: 8242-3536 PT Time Calculation (min) (ACUTE ONLY):  34 min  Charges:  $Therapeutic Activity: 8-22 mins                     Holly Stevenson B. Beverely Risen PT, DPT Acute Rehabilitation Services Pager [phone] Office [phone]    Elon Alas [facility] 07/04/2020, 2:33 PM

## 2020-07-04 NOTE — Progress Notes (Signed)
Inpatient Diabetes Program Recommendations  AACE/ADA: New Consensus Statement on Inpatient Glycemic Control (2015)  Target Ranges:  Prepandial:   less than 140 mg/dL      Peak postprandial:   less than 180 mg/dL (1-2 hours)      Critically ill patients:  140 - 180 mg/dL   Results for Holly Stevenson, Holly Stevenson (MRN [id number]) as of 07/04/2020 06:25  Ref. Range 07/03/2020 08:44 07/03/2020 11:40 07/03/2020 16:25 07/03/2020 20:17  Glucose-Capillary Latest Ref Range: 70 - 99 mg/dL 025 (H) 852 (H) 778 (H) 270 (H)   Results for Holly Stevenson, Holly Stevenson (MRN [id number]) as of 07/04/2020 06:25  Ref. Range 07/04/2020 06:19  Glucose-Capillary Latest Ref Range: 70 - 99 mg/dL 431 (H)     Home DM Meds: Jardiance 25 mg Daily       Metformin 1000 mg BID  Current Orders: Lantus 10 units BID     Novolog Sensitive Correction Scale/ SSI (0-9 units) TID AC + HS     MD- Checked with RN at 6:30am today and per RN, tube feeds are running at 55cc/hr.  CBGs have been >200.  Please consider the following:  1. Change Novolog SSI to Q4 hour coverage (currently ordered ac/hs)  2. Start Novolog Tube Feed Coverage: Novolog 3 units Q4 hours  HOLD if tube feeds HELD or Stopped for any reason     --Will follow patient during hospitalization--  Ambrose Finland RN, MSN, CDE Diabetes Coordinator Inpatient Glycemic Control Team Team Pager: [phone] (8a-5p)

## 2020-07-04 NOTE — Progress Notes (Addendum)
NAME:  Holly Stevenson, MRN:  [id number], DOB:  09/18/1950, LOS: 20 ADMISSION DATE:  06/14/2020, CONSULTATION DATE:  06/16/20 REFERRING MD:  Randie Heinz  CHIEF COMPLAINT:  Vent Management   Brief History   Holly Stevenson is a 70 y.o. female who has had multiple vascular surgeries, most recently L common femoral and external iliac endarterectomy with vein patch angioplasty, bilateral common iliac artery stenting, stenting of left external iliac artery, L popliteal artery endarterectomy, L common femoral to above knee popliteal bypass, L lower extremity thromboembolectomy on 12/31.  She was left intubated post op and PCCM asked to assist with vent management.  PCCM became involved again on 1/16 when she developed worsening hypoxemia.    Past Medical History  has Diabetic infection of left foot (HCC); DM2 (diabetes mellitus, type 2) (HCC); CKD (chronic kidney disease) stage 3, GFR 30-59 ml/min (HCC); HTN (hypertension); Cavitary lesion of lung; Cellulitis of toe of left foot; Idiopathic chronic gout of left knee without tophus; Gangrene of toe of left foot (HCC); PAD (peripheral artery disease) (HCC); Critical lower limb ischemia (HCC); COPD (chronic obstructive pulmonary disease) (HCC); Diabetic renal disease (HCC); Hyperlipidemia; Personal history of colonic polyps; Polyneuropathy; Tobacco user; Infected prosthetic vascular graft (HCC); Status post surgery; Respiratory failure (HCC); and Pressure injury of skin on their problem list.  Significant Hospital Events   12/16 > exploration of right groin and control of hemorrhage, thrombectomy right to left femoral bypass graft, interposition grafting right common femoral artery to femoral femoral bypass, thrombectomy left femoral popliteal bypass graft, angiogram LLE. 12/21 > EBUS of RUL > granulomas. 12/29 > sharp excisional debridement right groin wound, pulsavac irrigation right groin wound, interposition graft of femorofemoral bypass. 12/30 >  Excision of right to left femorofemoral bypass, right common femoral bovine pericardial patch and left femoral to popliteal artery bypass graft with reopening of left groin and left above knee popliteal exposure, harvest of right greater saphenous vein, patch angioplasty of right common femoral artery, left common femoral endarterectomy and saphenous vein angioplasty, left popliteal artery vein angioplasty, aortogram, stent of left common and external iliac veins, stent of L SFA, sartorius muscle flap right groin wound. 12/31 > L common femoral and external iliac endarterectomy with vein patch angioplasty, harvest L greater saphenous vein, aortogram with bilateral iliofemoral runoff,  bilateral common iliac artery stenting, stenting of left external iliac artery, L popliteal artery endarterectomy, L common femoral to above knee popliteal bypass, L lower extremity thromboembolectomy 1/11 Abrupt onset SOB. Improved with neb, sent for CTA chest with suspicion for PE. CTA neg for PE but does show some progression of cavitary lesion, and surrounding decreased aeration. Pulm re-consulted in this setting 1/16 worsening encephalopathy, dyspnea, hypercarbia, give lasix, BIPAP, made DNR  Consults:  PCCM  Procedures:  12/21 EBUS  Significant Diagnostic Tests:  CTA chest 1/11> no evidence of PE. R apical cavitary lesion, slight progression from CTA chest 11/9, now with worsening aeration of RUL compared to CTA 11/9. Bilateral pleural effusions, new from prior.    Micro Data:  COVID 12/29 > neg. 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  rate 20, height 5\' 8"  (1.727 m), weight 99.3 kg, SpO2 100 %.    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 regimen per ID (Rifamin, ethambutol, INH)  Acute on chronic hypoxic respiratory failure  Due to pulmonary edema and aspiration pneumonitis > significantly improved Acute diastolic heart failure exacerbation P: OK to resume tube feedings NPO except sips/chips  F/u SLP evaluation tomorrow Continue cefepime 5 days Wean off O2 for O2 saturation> 90% Strict I/O Minimize IV input  Bilateral pleural effusion, new, due to heart failure exacerbation -Unlikely related to mycobacterial infection P: Diurese again today Repeat CXR in AM  CODE STATUS: DNR, no ICU transfer   Rest of acute and chronic comorbidities managed per primary team  Malnutrition  PCCM will continue to follow   07/03/20, MD [place] [facility] Pager: [phone] Cell: [phone] If no response, call [phone]

## 2020-07-04 NOTE — Progress Notes (Signed)
Pt requested to be placed on BIPAP for increased SOB. Pt is tolerating well at this time. RT will monitor.

## 2020-07-04 NOTE — Progress Notes (Signed)
   07/04/20 0900  Clinical Encounter Type  Visited With Patient and family together  Visit Type Initial  Referral From Nurse  Consult/Referral To Chaplain  Spiritual Encounters  Spiritual Needs Literature  Chaplain responded to consult for Ms. Kilts.  Spoke with Ms. Muccio's daughter Efraim Kaufmann and she informed me her mother had power of attorney, she just has to find it.  I gave her the paperwork just in case and she was very appreciative.    Chaplain Vincella Morgan-Simpson [phone]

## 2020-07-04 NOTE — Progress Notes (Addendum)
Occupational Therapy Treatment Patient Details Name: Holly Stevenson MRN: [id number] DOB: 11/12/1950 Today's Date: 07/04/2020    History of present illness 70 yo admitted 12/29 with right groin dehiscence s/p debridement and graft of fem fem BPG. Additional surgeries 12/30 and 12/31 for endarterectomy and thrombolectomy with resultant LLE BPG occlusion and Rt abdominal hematoma.Bil groin VACs.  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 PMHx: HTN, DM, CKD, gout, PAD, COPD, HLD, polyneuropathy, recent bronchoscopy, 12/16-12/22 multiple vascular surgeries.   OT comments  Pt with gradual progress towards OT goals, reports feeling better than yesterday. Pt completed session on BiPAP with vitals stable though increased respirations noted with activity. Pt benefits from cues for calming strategies. Pt overall Max A x 2 for bed mobility, Mod A x 2 for sit to stand in [place]. Pt able to maintain standing in [place] for one minute and endorsed feeling weak after that attempt. Pt was able to sit EOB for >10 minutes with preference for UE support on bedrail but able to demonstrate sitting unsupported intermittently. Continue to recommend postacute rehab to maximize independence and safety for ADLs/transfers. Hope to progress standing tolerance, educate on UE HEP and educate on compensatory strategies for LB ADLs during next session. Will continue to monitor and update recommendations as appropriate.    Follow Up Recommendations  CIR;Supervision/Assistance - 24 hour    Equipment Recommendations  3 in 1 bedside commode;Tub/shower bench;Wheelchair (measurements OT);Wheelchair cushion (measurements OT)    Recommendations for Other Services Rehab consult    Precautions / Restrictions Precautions Precautions: Fall;Other (comment) Precaution Comments: bil groin VAC, [place]rak, monitor O2 Restrictions Weight Bearing Restrictions: Yes LLE Weight Bearing: Non weight bearing       Mobility Bed Mobility Overal bed mobility:  Needs Assistance Bed Mobility: Rolling;Supine to Sit;Sit to Supine Rolling: Min assist   Supine to sit: Max assist;+2 for physical assistance;+2 for safety/equipment;HOB elevated Sit to supine: Max assist;+2 for physical assistance;+2 for safety/equipment   General bed mobility comments: Max A x 2 for advancement to EOB. Able to use B UE on bedrail to assist but continues to require heavy assist for trunk advancement and scooting hips to EOB safely. Pt able to roll to L side with Min A and bedrail use for adjustment of bed pad  Transfers Overall transfer level: Needs assistance Equipment used: Ambulation equipment used Transfers: Sit to/from Stand Sit to Stand: Mod assist;+2 physical assistance;+2 safety/equipment;From elevated surface         General transfer comment: Mod A x 2 for sit to stand from elevated bed. Pt able to use UE strength to assist and maintained standing in [place] for one minute before rest break needed and reports feeling too drained to attempt again    Balance Overall balance assessment: Needs assistance Sitting-balance support: Feet supported;Single extremity supported Sitting balance-Leahy Scale: Poor Sitting balance - Comments: reliant on at least one UE Support EOB. Able to hold sitting balance without support for <20 seconds but due to sensation of falling, pt preference for one UE support on bedrail   Standing balance support: Bilateral upper extremity supported Standing balance-Leahy Scale: Poor Standing balance comment: reliant on UE support in [place] to maintain balance                           ADL either performed or assessed with clinical judgement   ADL Overall ADL's : Needs assistance/impaired  General ADL Comments: Session focused on anxiety/calming strategies, endurance with sitting balance EOB     Vision   Vision Assessment?: No apparent visual deficits   Perception      Praxis      Cognition Arousal/Alertness: Awake/alert Behavior During Therapy: WFL for tasks assessed/performed Overall Cognitive Status: Within Functional Limits for tasks assessed                                 General Comments: A&Ox4, follows commands well. Noted with continued anxiety and reports sensation of feeling like she is falling while sitting EOB        Exercises     Shoulder Instructions       General Comments Pt received on Bipap. SpO2 100% throughout, HR WFL. Pt endorses SOB during activity, also attributed to anxiety. RR 22 sitting EOB up to 39 in standing. Benefits from encouragement, rest breaks and cues to slow breathing    Pertinent Vitals/ Pain       Pain Assessment: 0-10 Pain Score: 7  Pain Location: L residual limb Pain Descriptors / Indicators: Sore;Operative site guarding;Grimacing;Guarding Pain Intervention(s): Monitored during session;Repositioned;Other (comment) (Rn notified)  Home Living                                          Prior Functioning/Environment              Frequency  Min 2X/week        Progress Toward Goals  OT Goals(current goals can now be found in the care plan section)  Progress towards OT goals: Progressing toward goals  Acute Rehab OT Goals Patient Stated Goal: get better and go home OT Goal Formulation: With patient Time For Goal Achievement: 07/18/20 Potential to Achieve Goals: Good ADL Goals Pt Will Perform Grooming: with modified independence;sitting Pt Will Perform Upper Body Bathing: with set-up;sitting Pt Will Perform Lower Body Bathing: with min assist;sitting/lateral leans Pt Will Perform Upper Body Dressing: with set-up;sitting Pt Will Perform Lower Body Dressing: with mod assist;sitting/lateral leans Pt Will Transfer to Toilet: with mod assist;squat pivot transfer;with transfer board;bedside commode Pt/caregiver will Perform Home Exercise Program: Increased  strength;Both right and left upper extremity;With theraband;With written HEP provided;Independently Additional ADL Goal #1: Pt to demonstrate ability to sit EOB >5-7 minutes with no physical support needed for balance to improve ADL participation  Plan Discharge plan remains appropriate    Co-evaluation    PT/OT/SLP Co-Evaluation/Treatment: Yes Reason for Co-Treatment: Complexity of the patient's impairments (multi-system involvement);For patient/therapist safety;To address functional/ADL transfers   OT goals addressed during session: Strengthening/ROM      AM-PAC OT "6 Clicks" Daily Activity     Outcome Measure   Help from another person eating meals?: None Help from another person taking care of personal grooming?: A Little Help from another person toileting, which includes using toliet, bedpan, or urinal?: Total Help from another person bathing (including washing, rinsing, drying)?: A Lot Help from another person to put on and taking off regular upper body clothing?: A Little Help from another person to put on and taking off regular lower body clothing?: A Lot 6 Click Score: 15    End of Session Equipment Utilized During Treatment: Other (comment);Oxygen Antony Salmon)  OT Visit Diagnosis: Muscle weakness (generalized) (M62.81);Pain;Other abnormalities of gait and mobility (R26.89) Pain - Right/Left: Left  Pain - part of body: Leg   Activity Tolerance Patient tolerated treatment well   Patient Left in bed;with call bell/phone within reach   Nurse Communication Mobility status;Other (comment) (pain level)        Time: 1021-1173 OT Time Calculation (min): 32 min  Charges: OT General Charges $OT Visit: 1 Visit OT Treatments $Therapeutic Activity: 8-22 mins  Lorre Munroe, OTR/L   Lorre Munroe 07/04/2020, 2:17 PM

## 2020-07-04 NOTE — Progress Notes (Signed)
Pt was on 9L [place] when RT entered room. RT turned the 02 down to 5L and pt is tolerating well. Pt is able to answer questions appropriately and does not appear to have any respiratory distress at this time on [place]. RT will monitor throughout day.

## 2020-07-04 NOTE — Progress Notes (Signed)
Inpatient Rehab Admissions Coordinator:   Pt continues to struggle with respiratory failure, requiring bipap over night and during therapy session today.  Not sure whether she could tolerate CIR level therapies at this point.  Will follow for a few more days for progress but may benefit from slower placed rehab.   Estill Dooms, PT, DPT Admissions Coordinator [phone] 07/04/20  4:25 PM

## 2020-07-04 NOTE — Evaluation (Signed)
Clinical/Bedside Swallow Evaluation Patient Details  Name: Holly Stevenson MRN: [id number] Date of Birth: 18-Nov-1950  Today's Date: 07/04/2020 Time: SLP Start Time (ACUTE ONLY): 0835 SLP Stop Time (ACUTE ONLY): 0859 SLP Time Calculation (min) (ACUTE ONLY): 24 min  Past Medical History:  Past Medical History:  Diagnosis Date  . Diabetes mellitus without complication (HCC)    Type II  . High cholesterol   . Hypertension   . Pneumonia    Past Surgical History:  Past Surgical History:  Procedure Laterality Date  . ABDOMINAL AORTOGRAM W/LOWER EXTREMITY Bilateral 04/28/2020   Procedure: ABDOMINAL AORTOGRAM W/LOWER EXTREMITY;  Surgeon: Sherren Kerns, MD;  Location: [facility] INVASIVE CV LAB;  Service: Cardiovascular;  Laterality: Bilateral;  . AMPUTATION Left 06/18/2020   Procedure: LEFT ABOVE KNEE AMPUTATION;  Surgeon: Sherren Kerns, MD;  Location: [facility] OR;  Service: Vascular;  Laterality: Left;  . ANGIOPLASTY N/A 06/15/2020   Procedure: VEIN PATCH ANGIOPLASTY LEFT COMMON, EXTERNAL ILIAC, and COMMON FEMORAL ARTERY;  Surgeon: Maeola Harman, MD;  Location: [facility] OR;  Service: Vascular;  Laterality: N/A;  . AORTOGRAM N/A 06/15/2020   Procedure: AORTOGRAM;  Surgeon: Maeola Harman, MD;  Location: [facility] OR;  Service: Vascular;  Laterality: N/A;  . AORTOGRAM N/A 06/15/2020   Procedure: AORTOGRAM;  Surgeon: Maeola Harman, MD;  Location: [facility] OR;  Service: Vascular;  Laterality: N/A;  . APPLICATION OF WOUND VAC Bilateral 06/18/2020   Procedure: EXCHANGE OF WOUND VAC;  Surgeon: Sherren Kerns, MD;  Location: [facility] OR;  Service: Vascular;  Laterality: Bilateral;  . APPLICATION OF WOUND VAC Bilateral 06/15/2020   Procedure: APPLICATION OF WOUND VAC BILATERAL GROINS;  Surgeon: Maeola Harman, MD;  Location: [facility] OR;  Service: Vascular;  Laterality: Bilateral;  . APPLICATION OF WOUND VAC Bilateral 06/15/2020   Procedure: APPLICATION OF WOUND VAC BILATERAL GROINS;   Surgeon: Maeola Harman, MD;  Location: [facility] OR;  Service: Vascular;  Laterality: Bilateral;  . APPLICATION OF WOUND VAC Bilateral 06/21/2020   Procedure: WOUND VAC CHANGE;  Surgeon: Cephus Shelling, MD;  Location: [facility] OR;  Service: Vascular;  Laterality: Bilateral;  . BELOW KNEE LEG AMPUTATION Left 06/18/2020  . BRONCHIAL BIOPSY  05/23/2020   Procedure: BRONCHIAL BIOPSIES;  Surgeon: Leslye Peer, MD;  Location: [facility] ENDOSCOPY;  Service: Cardiopulmonary;;  . BRONCHIAL BIOPSY  06/06/2020   Procedure: BRONCHIAL BIOPSIES;  Surgeon: Leslye Peer, MD;  Location: [facility] ENDOSCOPY;  Service: Pulmonary;;  . BRONCHIAL BRUSHINGS  05/23/2020   Procedure: BRONCHIAL BRUSHINGS;  Surgeon: Leslye Peer, MD;  Location: [facility] ENDOSCOPY;  Service: Cardiopulmonary;;  . BRONCHIAL BRUSHINGS  06/06/2020   Procedure: BRONCHIAL BRUSHINGS;  Surgeon: Leslye Peer, MD;  Location: [facility] ENDOSCOPY;  Service: Pulmonary;;  . BRONCHIAL NEEDLE ASPIRATION BIOPSY  05/23/2020   Procedure: BRONCHIAL NEEDLE ASPIRATION BIOPSIES;  Surgeon: Leslye Peer, MD;  Location: [facility] ENDOSCOPY;  Service: Cardiopulmonary;;  . BRONCHIAL NEEDLE ASPIRATION BIOPSY  06/06/2020   Procedure: BRONCHIAL NEEDLE ASPIRATION BIOPSIES;  Surgeon: Leslye Peer, MD;  Location: [facility] ENDOSCOPY;  Service: Pulmonary;;  . BRONCHIAL WASHINGS  05/23/2020   Procedure: BRONCHIAL WASHINGS;  Surgeon: Leslye Peer, MD;  Location: [facility] ENDOSCOPY;  Service: Cardiopulmonary;;  . BRONCHIAL WASHINGS  06/06/2020   Procedure: BRONCHIAL WASHINGS;  Surgeon: Leslye Peer, MD;  Location: [facility] ENDOSCOPY;  Service: Pulmonary;;  . COLONOSCOPY W/ BIOPSIES AND POLYPECTOMY    . ELECTROMAGNETIC NAVIGATION BROCHOSCOPY N/A 05/23/2020   Procedure: ELECTROMAGNETIC NAVIGATION BRONCHOSCOPY;  Surgeon: Leslye Peer, MD;  Location: [facility] ENDOSCOPY;  Service: Cardiopulmonary;  Laterality: N/A;  . ENDARTERECTOMY FEMORAL Right 05/10/2020   Procedure: ENDARTERECTOMY RIGHT FEMORAL;  Surgeon:  Leonie Douglas, MD;  Location: [facility] OR;  Service: Vascular;  Laterality: Right;  . ENDARTERECTOMY FEMORAL Bilateral 06/15/2020   Procedure: BILATERAL FEMORAL ENDARTERECTOMY WITH PATCH ANGIOPLASTY RIGHT FERMORAL;  Surgeon: Maeola Harman, MD;  Location: [facility] OR;  Service: Vascular;  Laterality: Bilateral;  . FEMORAL-FEMORAL BYPASS GRAFT Bilateral 05/10/2020   Procedure: RIGHT TO LEFT FEMORAL-FEMORAL ARTERY BYPASS GRAFT;  Surgeon: Leonie Douglas, MD;  Location: [facility] OR;  Service: Vascular;  Laterality: Bilateral;  . FEMORAL-FEMORAL BYPASS GRAFT Right 06/01/2020   Procedure: INTERPOSITION GRAFT RIGHT COMMON FEMORAL TO FEMORAL-FEMORAL BYPASS;  Surgeon: Leonie Douglas, MD;  Location: [facility] OR;  Service: Vascular;  Laterality: Right;  . FEMORAL-FEMORAL BYPASS GRAFT Right 06/14/2020   Procedure: interposition FEMORAL-FEMORAL ARTERY bypass using 57mm graft;  Surgeon: Maeola Harman, MD;  Location: [facility] OR;  Service: Vascular;  Laterality: Right;  . FEMORAL-POPLITEAL BYPASS GRAFT Left 05/10/2020   Procedure: LEFT FEMORAL-POPLITEAL ARTERY BYPASS GRAFT;  Surgeon: Leonie Douglas, MD;  Location: [facility] OR;  Service: Vascular;  Laterality: Left;  . FEMORAL-POPLITEAL BYPASS GRAFT Left 06/15/2020   Procedure: BYPASS GRAFT FEMORAL TO ABOVE KNEE POPLITEAL ARTERY USING CYRO SAPHENOUS VEIN;  Surgeon: Maeola Harman, MD;  Location: [facility] OR;  Service: Vascular;  Laterality: Left;  . GROIN DISSECTION Right 06/01/2020   Procedure: RIGHT GROIN EXPLORATION;  Surgeon: Leonie Douglas, MD;  Location: [facility] OR;  Service: Vascular;  Laterality: Right;  . INSERTION OF ILIAC STENT Bilateral 06/15/2020   Procedure: INSERTION OF BILATERAL COMMON ILIAC STENTS, INSERTION OF LEFT EXTERNAL ILIAC STENT;  Surgeon: Maeola Harman, MD;  Location: [facility] OR;  Service: Vascular;  Laterality: Bilateral;  . INTRAOPERATIVE ARTERIOGRAM Left 06/01/2020   Procedure: LEFT LOWER EXTREMITY  ARTERIOGRAM;  Surgeon: Leonie Douglas, MD;  Location: [facility] OR;  Service: Vascular;  Laterality: Left;  . LOWER EXTREMITY ANGIOGRAM Left 05/10/2020   Procedure: LEFT LOWER EXTREMITY ANGIOGRAM;  Surgeon: Leonie Douglas, MD;  Location: [facility] OR;  Service: Vascular;  Laterality: Left;  . LOWER EXTREMITY ANGIOGRAM Left 06/15/2020   Procedure: ANGIOGRAM SUPERFICIAL FEMORAL ARTERY x 4 WITH LEFT COMMON ILLIAC STENT AND STENTING LEFT LOWER EXTREMITY;  Surgeon: Maeola Harman, MD;  Location: [facility] OR;  Service: Vascular;  Laterality: Left;  . LOWER EXTREMITY ANGIOGRAM Left 06/15/2020   Procedure: LOWER EXTREMITY ANGIOGRAM;  Surgeon: Maeola Harman, MD;  Location: [facility] OR;  Service: Vascular;  Laterality: Left;  . PATCH ANGIOPLASTY Right 05/10/2020   Procedure: PATCH ANGIOPLASTY USING Kathleen Lime;  Surgeon: Leonie Douglas, MD;  Location: [facility] OR;  Service: Vascular;  Laterality: Right;  . PERIPHERAL VASCULAR BALLOON ANGIOPLASTY Left 04/28/2020   Procedure: PERIPHERAL VASCULAR BALLOON ANGIOPLASTY;  Surgeon: Sherren Kerns, MD;  Location: [facility] INVASIVE CV LAB;  Service: Cardiovascular;  Laterality: Left;  Unable to cross left iliac lesion to intervene  . REMOVAL OF GRAFT N/A 06/15/2020   Procedure: EXCISION OF FEMORAL-FEMORAL BYPASS,  EXCISION OF RIGHT FEMORAL-POPLITEAL BYPASS;  Surgeon: Maeola Harman, MD;  Location: [facility] OR;  Service: Vascular;  Laterality: N/A;  . THROMBECTOMY FEMORAL ARTERY N/A 06/01/2020   Procedure: THROMBECTOMY FEMORAL- FEMORAL BYPASS;  Surgeon: Leonie Douglas, MD;  Location: [facility] OR;  Service: Vascular;  Laterality: N/A;  . THROMBECTOMY FEMORAL ARTERY Left 06/15/2020   Procedure: LEFT LOWER EXTREMITY THROMBECTOMY;  Surgeon: Maeola Harman, MD;  Location: [facility]  OR;  Service: Vascular;  Laterality: Left;  . THROMBECTOMY FEMORAL- FEMORAL BYPASS (N/A )  06/01/2020  . THROMBECTOMY OF BYPASS GRAFT FEMORAL- POPLITEAL ARTERY Left 06/01/2020   Procedure: THROMBECTOMY OF LEFT BYPASS GRAFT  FEMORAL-POPLITEAL ARTERY;  Surgeon: Leonie Douglas, MD;  Location: [facility] OR;  Service: Vascular;  Laterality: Left;  . THROMBECTOMY OF LEFT BYPASS GRAFT FEMORAL-POPLITEAL ARTERY (Left )  06/01/2020  . VASCULAR SURGERY  04/2020  . VEIN HARVEST Right 06/15/2020   Procedure: RIGHT GREATER SAPHENOUS VEIN HARVEST;  Surgeon: Maeola Harman, MD;  Location: [facility] OR;  Service: Vascular;  Laterality: Right;  Marland Kitchen VIDEO BRONCHOSCOPY N/A 05/23/2020   Procedure: VIDEO BRONCHOSCOPY WITH FLUORO;  Surgeon: Leslye Peer, MD;  Location: [facility] ENDOSCOPY;  Service: Cardiopulmonary;  Laterality: N/A;  . VIDEO BRONCHOSCOPY WITH ENDOBRONCHIAL NAVIGATION Right 06/06/2020   Procedure: VIDEO BRONCHOSCOPY WITH ENDOBRONCHIAL NAVIGATION;  Surgeon: Leslye Peer, MD;  Location: [facility] ENDOSCOPY;  Service: Pulmonary;  Laterality: Right;  . WOUND DEBRIDEMENT Right 06/14/2020   Procedure: RIGHT GROIN DEBRIDEMENT with pulsatile jet lavage of 11cm x 4cm x 4cm wound;  Surgeon: Maeola Harman, MD;  Location: [facility] OR;  Service: Vascular;  Laterality: Right;  . WOUND DEBRIDEMENT Bilateral 06/21/2020   Procedure: DEBRIDEMENT FOR GROIN WOUNDS;  Surgeon: Cephus Shelling, MD;  Location: [facility] OR;  Service: Vascular;  Laterality: Bilateral;   HPI:  Pt is a 70 y.o. female presenting with an infected prosthetic graft and a history of HTN, DM, CKD, gout, PAD, COPD, HLD, polyneuropathy, recent bronchoscopy, 12/16-12/22 multiple vascular surgeries.  Additional surgeries 12/30 and 12/31 for endarterectomy and thrombolectomy with resultant LLE BPG occlusion and Rt abdominal hematoma. Recent CXR revealed diffuse alveolar airspace disease throughout both lungs. Was on BiPAP 07/03/2020.   Assessment / Plan / Recommendation Clinical Impression  Pt demonstrated delayed coughing with PO trials of ice chips, thin liquids, and purees. Coughing appeared to become more persistent during PO trials compared to baseline. Pt reports that she has not  had difficulty swallowing in the past but that she is coughing more frequently with intake since admission. Pt reported that she feels she can either swallow or breathe but cannot do both and therefore does not want a lot of POs. Recomend continuing regular diet with thin liquids but dicussed precautions such as not eating or drinking when short of breath and to take smaller sips and bites. Given concern for possible respiratory based dysphagia would proceed cautiously with any PO intake today, holding POs if there is any coughing. Also discussed further testing to see if pt presents with dysphagia if s/s of possible aspiration continue. Will f/u to further examine how pt is doing with diet. SLP Visit Diagnosis: Dysphagia, unspecified (R13.10)    Aspiration Risk  Moderate aspiration risk    Diet Recommendation Regular;Thin liquid   Liquid Administration via: Straw;Cup Medication Administration: Whole meds with liquid Supervision: Patient able to self feed;Intermittent supervision to cue for compensatory strategies Compensations: Slow rate;Small sips/bites;Other (Comment) (Hold POs if short of breath or coughing) Postural Changes: Seated upright at 90 degrees;Remain upright for at least 30 minutes after po intake    Other  Recommendations Oral Care Recommendations: Oral care BID   Follow up Recommendations Inpatient Rehab      Frequency and Duration min 2x/week  2 weeks       Prognosis Prognosis for Safe Diet Advancement: Good      Swallow Study   General HPI: Pt is a 70 y.o. female presenting  with an infected prosthetic graft and a history of HTN, DM, CKD, gout, PAD, COPD, HLD, polyneuropathy, recent bronchoscopy, 12/16-12/22 multiple vascular surgeries.  Additional surgeries 12/30 and 12/31 for endarterectomy and thrombolectomy with resultant LLE BPG occlusion and Rt abdominal hematoma. Recent CXR revealed diffuse alveolar airspace disease throughout both lungs. Was on BiPAP  07/03/2020. Type of Study: Bedside Swallow Evaluation Previous Swallow Assessment: No Diet Prior to this Study: Regular;Thin liquids;NG Tube Temperature Spikes Noted: No Respiratory Status: Nasal cannula History of Recent Intubation: No Behavior/Cognition: Alert;Cooperative Oral Cavity Assessment: Within Functional Limits Oral Care Completed by SLP: No Oral Cavity - Dentition: Edentulous (Pt has dentures but prefers not to wear them) Vision: Functional for self-feeding Self-Feeding Abilities: Able to feed self Patient Positioning: Upright in bed Baseline Vocal Quality: Normal Volitional Cough: Congested Volitional Swallow: Able to elicit    Oral/Motor/Sensory Function Overall Oral Motor/Sensory Function: Within functional limits   Ice Chips Ice chips: Impaired Presentation: Spoon Pharyngeal Phase Impairments: Cough - Delayed   Thin Liquid Thin Liquid: Impaired Presentation: Self Fed;Straw Pharyngeal  Phase Impairments: Cough - Delayed    Nectar Thick Nectar Thick Liquid: Not tested   Honey Thick Honey Thick Liquid: Not tested   Puree Puree: Impaired Presentation: Spoon Pharyngeal Phase Impairments: Cough - Delayed   Solid     Solid: Not tested      Zettie CooleyMegan S., SLP Student 07/04/2020,11:18 AM

## 2020-07-04 NOTE — Progress Notes (Addendum)
Progress Note    07/04/2020 7:08 AM 13 Days Post-Op  Subjective:  More awake today.  Does not have any complaints.    Afebrile HR 60's-90's NSR 140's-160's systolic 100% BiPAP  Vitals:   07/04/20 0400 07/04/20 0405  BP: (!) 143/47   Pulse: 65   Resp: 20 20  Temp: 97.7 F (36.5 C)   SpO2: 100%     Physical Exam: Cardiac:  regular Lungs:  Non labored now on O2NC at 100% Incisions:  Bilateral groins with wound vacs in place with seal.  Left AKA stump with staples in tact.  Right leg with staples in tact.  Extremities:  Brisk right AT/PT doppler signals.   CBC    Component Value Date/Time   WBC 11.2 (H) 07/04/2020 0130   RBC 2.44 (L) 07/04/2020 0130   HGB 7.0 (L) 07/04/2020 0130   HCT 22.6 (L) 07/04/2020 0130   PLT 237 07/04/2020 0130   MCV 92.6 07/04/2020 0130   MCH 28.7 07/04/2020 0130   MCHC 31.0 07/04/2020 0130   RDW 17.9 (H) 07/04/2020 0130   LYMPHSABS 1.3 04/25/2020 0345   MONOABS 1.1 (H) 04/25/2020 0345   EOSABS 0.1 04/25/2020 0345   BASOSABS 0.0 04/25/2020 0345    BMET    Component Value Date/Time   NA 138 07/04/2020 0130   K 4.1 07/04/2020 0130   CL 98 07/04/2020 0130   CO2 31 07/04/2020 0130   GLUCOSE 244 (H) 07/04/2020 0130   BUN 36 (H) 07/04/2020 0130   CREATININE 0.56 07/04/2020 0130   CALCIUM 7.6 (L) 07/04/2020 0130   GFRNONAA >60 07/04/2020 0130   GFRAA 52 (L) 02/18/2018 1610    INR    Component Value Date/Time   INR 1.0 05/03/2020 1530     Intake/Output Summary (Last 24 hours) at 07/04/2020 0708 Last data filed at 07/04/2020 0600 Gross per 24 hour  Intake 1965 ml  Output 2640 ml  Net -675 ml     Assessment:  70 y.o. female is s/p:  Excision of infected graft material and muscle flap and subsequent L AKA  13 Days Post-Op  Plan: -pt more awake this morning and looks better.  -brisk doppler signals right AT/PT -bilateral groin wound vacs with good seal -pt was placed on BiPAP yesterday-on [place] this morning with O2 sat of  100% -Cefepime added to regimen yesterday.  Pt afebrile and leukocytosis improved today -hgb 7.0 today down from 8.3 yesterday.  Pt tolerating.  Check CBC tomorrow.  Transfuse for < 7.0.  -CCM interdisciplinary goals meeting with family yesterday.  Pt now full DNR with continuing full medical support but no vasopressors and no ICU transfer.   -DVT prophylaxis:  Lovenox   Doreatha Massed, PA-C [facility] [phone] 07/04/2020 7:08 AM  I have seen and evaluated the patient. I agree with the PA note as documented above.  Patient had some worsening respiratory distress yesterday on BiPAP and ultimately family decided not to move her to the ICU and no intubation.  This morning her respiratory status actually looks better and she is on 5 L nasal cannula and is getting a swallow study at bedside.  Appears comfortable.  VAC changes were done yesterday to both groins and tissue appeared healthy.  Left AKA looks okay.  Hgb 8.3 --> 7.0 and will monitor.  Appreciate PCCM input.  Cefepime was added to her antibiotic regimen yesterday and ID following for m. [place]ii pulmonary infection.  Cephus Shelling, MD [facility]  [facility] [place] Office: [phone]

## 2020-07-04 NOTE — Progress Notes (Signed)
[facility]    Date of Admission:  06/14/2020   Total days of antibiotics day 6 of RIE, day 2 cefepmie           ID: Holly Stevenson is a 70 y.o. 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 Newly dx pulmonary m.[place]ii, with respiratory distress Active Problems:   Infected prosthetic vascular graft (HCC)   Status post surgery   Respiratory failure (HCC)   Pressure injury of skin    Subjective: Much improved this morning but had tenuous evening with requiring NRB. She is now back on nasal cannula. Was started on cefepime last night given worse status concerned for  Aspiration pneumonitis. She reports occ productive cough  Ros: no diarrhea, no abdominal pain, no fevers  Medications:  . acetaminophen (TYLENOL) oral liquid 160 mg/5 mL  650 mg Per Tube Q6H  . acetaminophen  650 mg Oral Q6H  . aspirin  81 mg Per Tube Daily  . atorvastatin  40 mg Per Tube Daily  . benazepril  40 mg Per Tube Daily  . Chlorhexidine Gluconate Cloth  6 each Topical Daily  . cholecalciferol  1,000 Units Per Tube Daily  . docusate  100 mg Per Tube BID  . enoxaparin (LOVENOX) injection  40 mg Subcutaneous Q24H  . ethambutol  1,200 mg Per Tube Daily  . feeding supplement (GLUCERNA SHAKE)  237 mL Oral TID BM  . furosemide  20 mg Intravenous Q6H  . insulin aspart  0-5 Units Subcutaneous QHS  . insulin aspart  0-9 Units Subcutaneous TID WC  . insulin glargine  10 Units Subcutaneous BID  . ipratropium-albuterol  3 mL Nebulization QID  . isoniazid  300 mg Per Tube Daily  . multivitamin  15 mL Per Tube Daily  . OLANZapine  2.5 mg Per Tube QHS  . pantoprazole sodium  40 mg Per Tube Daily  . polyethylene glycol  17 g Per Tube Daily  . vitamin B-6  50 mg Per Tube Daily  . rifampin  600 mg Per Tube Daily  . senna-docusate  1 tablet Per Tube BID    Objective: Vital signs in last 24 hours: Temp:  [97.7 F (36.5 C)-98.9 F (37.2 C)] 97.7 F (36.5 C) (01/18  0400) Pulse Rate:  [65-82] 65 (01/18 0400) Resp:  [20-28] 20 (01/18 0405) BP: (141-160)/(47-62) 143/47 (01/18 0400) SpO2:  [98 %-100 %] 100 % (01/18 0400) FiO2 (%):  [40 %] 40 % (01/17 2007) Physical Exam  Constitutional:  oriented to person, place, and time. appears chronically ill, frail and well-nourished. No distress.  HENT: [place]/AT, PERRLA, no scleral icterus Mouth/Throat: Oropharynx is clear and moist. No oropharyngeal exudate.  Cardiovascular: Normal rate, regular rhythm and normal heart sounds. Exam reveals no gallop and no friction rub.  No murmur heard.  Pulmonary/Chest: Effort normal and bilateral rhonchi Ext: left aka, staples unchanged. Right lower extremity - staples are c/d/i Abdominal: Soft. Bowel sounds are normal.  exhibits no distension. There is no tenderness.  Lymphadenopathy: no cervical adenopathy. No axillary adenopathy Neurological: alert and oriented to person, place, and time.  Skin: Skin is warm and dry. No rash noted. No erythema.  Psychiatric: a normal mood and affect.  behavior is normal.   Lab Results Recent Labs    07/03/20 0455 07/03/20 0953 07/04/20 0130  WBC  --  18.0* 11.2*  HGB  --  8.3* 7.0*  HCT  --  27.8* 22.6*  NA 139  --  138  K 4.7  --  4.1  CL 99  --  98  CO2 31  --  31  BUN 36*  --  36*  CREATININE 0.62  --  0.56   Liver Panel Recent Labs    07/03/20 0455  PROT 6.4*  ALBUMIN 1.5*  AST 37  ALT 37  ALKPHOS 116  BILITOT 0.7  BILIDIR 0.2  IBILI 0.5   Sedimentation Rate No results for input(s): ESRSEDRATE in the last 72 hours. C-Reactive Protein No results for input(s): CRP in the last 72 hours.  Microbiology: reviwed Studies/Results: DG CHEST PORT 1 VIEW  Result Date: 07/03/2020 CLINICAL DATA:  Respiratory failure with increasing hypoxia and oxygen requirement. EXAM: PORTABLE CHEST 1 VIEW COMPARISON:  06/26/2020 and CTA of the chest on 06/27/2020. FINDINGS: Stable heart size. Significant worsening diffuse alveolar  airspace disease throughout both lungs with probable component of bilateral pleural effusions. No pneumothorax. IMPRESSION: Significant worsening of diffuse alveolar airspace disease throughout both lungs with probable component of bilateral pleural effusions. Electronically Signed   By: Irish Lack M.D.   On: 07/03/2020 10:41     Assessment/Plan: ams = now improved, likely from cessation of anxiolytics  Respiratory distress = may have had element on aspiration pneumonitis. Continue on supplemental oxygen. Currently on cefepime, will continue to monitor for need. She may have pneumonitis without pneumonia. If suspect pneumonia, would do amp/sub as better coverage.  M.k[place]ii pulmonary disease = continue on RIE plus B6.   [facility] Cell: [phone] Pager: [phone]  07/04/2020, 10:46 AM

## 2020-07-05 ENCOUNTER — Inpatient Hospital Stay (HOSPITAL_COMMUNITY): Payer: BC Managed Care – PPO

## 2020-07-05 ENCOUNTER — Inpatient Hospital Stay (HOSPITAL_COMMUNITY): Payer: BC Managed Care – PPO | Admitting: Anesthesiology

## 2020-07-05 ENCOUNTER — Encounter (HOSPITAL_COMMUNITY)
Admission: AD | Disposition: A | Discharge status: Hospice | Payer: Self-pay | Source: Home / Self Care | Attending: Vascular Surgery

## 2020-07-05 DIAGNOSIS — J9601 Acute respiratory failure with hypoxia: Secondary | ICD-10-CM | POA: Diagnosis not present

## 2020-07-05 HISTORY — PX: THROMBECTOMY FEMORAL ARTERY: SHX6406

## 2020-07-05 LAB — CBC
HCT: 23.6 % — ABNORMAL LOW (ref 36.0–46.0)
HCT: 33.2 % — ABNORMAL LOW (ref 36.0–46.0)
Hemoglobin: 7.1 g/dL — ABNORMAL LOW (ref 12.0–15.0)
Hemoglobin: 11.2 g/dL — ABNORMAL LOW (ref 12.0–15.0)
MCH: 28.1 pg (ref 26.0–34.0)
MCH: 29.1 pg (ref 26.0–34.0)
MCHC: 30.1 g/dL (ref 30.0–36.0)
MCHC: 33.7 g/dL (ref 30.0–36.0)
MCV: 93.3 fL (ref 80.0–100.0)
MCV: 86.2 fL (ref 80.0–100.0)
Platelets: 228 10*3/uL (ref 150–400)
Platelets: 185 10*3/uL (ref 150–400)
RBC: 2.53 MIL/uL — ABNORMAL LOW (ref 3.87–5.11)
RBC: 3.85 MIL/uL — ABNORMAL LOW (ref 3.87–5.11)
RDW: 18.3 % — ABNORMAL HIGH (ref 11.5–15.5)
RDW: 16.2 % — ABNORMAL HIGH (ref 11.5–15.5)
WBC: 10.2 10*3/uL (ref 4.0–10.5)
WBC: 18.3 10*3/uL — ABNORMAL HIGH (ref 4.0–10.5)
nRBC: 0 % (ref 0.0–0.2)
nRBC: 0.3 % — ABNORMAL HIGH (ref 0.0–0.2)

## 2020-07-05 LAB — POCT I-STAT 7, (LYTES, BLD GAS, ICA,H+H)
Acid-Base Excess: 8 mmol/L — ABNORMAL HIGH (ref 0.0–2.0)
Acid-Base Excess: 8 mmol/L — ABNORMAL HIGH (ref 0.0–2.0)
Bicarbonate: 32.8 mmol/L — ABNORMAL HIGH (ref 20.0–28.0)
Bicarbonate: 34.5 mmol/L — ABNORMAL HIGH (ref 20.0–28.0)
Calcium, Ion: 1.04 mmol/L — ABNORMAL LOW (ref 1.15–1.40)
Calcium, Ion: 1.01 mmol/L — ABNORMAL LOW (ref 1.15–1.40)
HCT: 18 % — ABNORMAL LOW (ref 36.0–46.0)
HCT: 30 % — ABNORMAL LOW (ref 36.0–46.0)
Hemoglobin: 6.1 g/dL — CL (ref 12.0–15.0)
Hemoglobin: 10.2 g/dL — ABNORMAL LOW (ref 12.0–15.0)
O2 Saturation: 100 %
O2 Saturation: 100 %
Patient temperature: 36.9
Potassium: 5 mmol/L (ref 3.5–5.1)
Potassium: 4.2 mmol/L (ref 3.5–5.1)
Sodium: 137 mmol/L (ref 135–145)
Sodium: 139 mmol/L (ref 135–145)
TCO2: 34 mmol/L — ABNORMAL HIGH (ref 22–32)
TCO2: 36 mmol/L — ABNORMAL HIGH (ref 22–32)
pCO2 arterial: 44.5 mmHg (ref 32.0–48.0)
pCO2 arterial: 56.8 mmHg — ABNORMAL HIGH (ref 32.0–48.0)
pH, Arterial: 7.476 — ABNORMAL HIGH (ref 7.350–7.450)
pH, Arterial: 7.392 (ref 7.350–7.450)
pO2, Arterial: 403 mmHg — ABNORMAL HIGH (ref 83.0–108.0)
pO2, Arterial: 259 mmHg — ABNORMAL HIGH (ref 83.0–108.0)

## 2020-07-05 LAB — BODY FLUID CELL COUNT WITH DIFFERENTIAL
Eos, Fluid: 0 %
Lymphs, Fluid: 58 %
Monocyte-Macrophage-Serous Fluid: 17 % — ABNORMAL LOW (ref 50–90)
Neutrophil Count, Fluid: 25 % (ref 0–25)
Total Nucleated Cell Count, Fluid: 83 cu mm (ref 0–1000)

## 2020-07-05 LAB — LACTATE DEHYDROGENASE, PLEURAL OR PERITONEAL FLUID: LD, Fluid: 79 U/L — ABNORMAL HIGH (ref 3–23)

## 2020-07-05 LAB — BASIC METABOLIC PANEL
Anion gap: 10 (ref 5–15)
BUN: 28 mg/dL — ABNORMAL HIGH (ref 8–23)
CO2: 31 mmol/L (ref 22–32)
Calcium: 7.7 mg/dL — ABNORMAL LOW (ref 8.9–10.3)
Chloride: 96 mmol/L — ABNORMAL LOW (ref 98–111)
Creatinine, Ser: 0.53 mg/dL (ref 0.44–1.00)
GFR, Estimated: >60 mL/min (ref >60)
Glucose, Bld: 242 mg/dL — ABNORMAL HIGH (ref 70–99)
Potassium: 3.9 mmol/L (ref 3.5–5.1)
Sodium: 137 mmol/L (ref 135–145)

## 2020-07-05 LAB — GLUCOSE, CAPILLARY
Glucose-Capillary: 267 mg/dL — ABNORMAL HIGH (ref 70–99)
Glucose-Capillary: 119 mg/dL — ABNORMAL HIGH (ref 70–99)
Glucose-Capillary: 128 mg/dL — ABNORMAL HIGH (ref 70–99)
Glucose-Capillary: 99 mg/dL (ref 70–99)
Glucose-Capillary: 101 mg/dL — ABNORMAL HIGH (ref 70–99)
Glucose-Capillary: 94 mg/dL (ref 70–99)
Glucose-Capillary: 190 mg/dL — ABNORMAL HIGH (ref 70–99)

## 2020-07-05 LAB — POCT I-STAT, CHEM 8
BUN: 33 mg/dL — ABNORMAL HIGH (ref 8–23)
BUN: 26 mg/dL — ABNORMAL HIGH (ref 8–23)
Calcium, Ion: 1.06 mmol/L — ABNORMAL LOW (ref 1.15–1.40)
Calcium, Ion: 1.03 mmol/L — ABNORMAL LOW (ref 1.15–1.40)
Chloride: 95 mmol/L — ABNORMAL LOW (ref 98–111)
Chloride: 96 mmol/L — ABNORMAL LOW (ref 98–111)
Creatinine, Ser: 0.5 mg/dL (ref 0.44–1.00)
Creatinine, Ser: 0.5 mg/dL (ref 0.44–1.00)
Glucose, Bld: 290 mg/dL — ABNORMAL HIGH (ref 70–99)
Glucose, Bld: 202 mg/dL — ABNORMAL HIGH (ref 70–99)
HCT: 16 % — ABNORMAL LOW (ref 36.0–46.0)
HCT: 30 % — ABNORMAL LOW (ref 36.0–46.0)
Hemoglobin: 5.4 g/dL — CL (ref 12.0–15.0)
Hemoglobin: 10.2 g/dL — ABNORMAL LOW (ref 12.0–15.0)
Potassium: 5 mmol/L (ref 3.5–5.1)
Potassium: 4.5 mmol/L (ref 3.5–5.1)
Sodium: 137 mmol/L (ref 135–145)
Sodium: 138 mmol/L (ref 135–145)
TCO2: 31 mmol/L (ref 22–32)
TCO2: 31 mmol/L (ref 22–32)

## 2020-07-05 LAB — COMPREHENSIVE METABOLIC PANEL
ALT: 67 U/L — ABNORMAL HIGH (ref 0–44)
AST: 119 U/L — ABNORMAL HIGH (ref 15–41)
Albumin: 1.5 g/dL — ABNORMAL LOW (ref 3.5–5.0)
Alkaline Phosphatase: 71 U/L (ref 38–126)
Anion gap: 8 (ref 5–15)
BUN: 29 mg/dL — ABNORMAL HIGH (ref 8–23)
CO2: 28 mmol/L (ref 22–32)
Calcium: 6.9 mg/dL — ABNORMAL LOW (ref 8.9–10.3)
Chloride: 102 mmol/L (ref 98–111)
Creatinine, Ser: 0.53 mg/dL (ref 0.44–1.00)
GFR, Estimated: >60 mL/min (ref >60)
Glucose, Bld: 103 mg/dL — ABNORMAL HIGH (ref 70–99)
Potassium: 4.1 mmol/L (ref 3.5–5.1)
Sodium: 138 mmol/L (ref 135–145)
Total Bilirubin: 1.4 mg/dL — ABNORMAL HIGH (ref 0.3–1.2)
Total Protein: 4.6 g/dL — ABNORMAL LOW (ref 6.5–8.1)

## 2020-07-05 LAB — PREPARE RBC (CROSSMATCH)

## 2020-07-05 LAB — PROTEIN, PLEURAL OR PERITONEAL FLUID: Total protein, fluid: <3 g/dL

## 2020-07-05 LAB — LACTATE DEHYDROGENASE: LDH: 383 U/L — ABNORMAL HIGH (ref 98–192)

## 2020-07-05 SURGERY — THROMBECTOMY, ARTERY, FEMORAL
Anesthesia: General | Laterality: Right

## 2020-07-05 MED ORDER — ORAL CARE MOUTH RINSE
15.0000 mL | OROMUCOSAL | Status: DC
  Administered 2020-07-05 (×2): 15 mL via OROMUCOSAL

## 2020-07-05 MED ORDER — SODIUM CHLORIDE 0.9% IV SOLUTION: Freq: Once | INTRAVENOUS | Status: DC

## 2020-07-05 MED ORDER — DOCUSATE SODIUM 50 MG/5ML PO LIQD
100.0000 mg | Freq: Two times a day (BID) | ORAL | Status: DC
  Administered 2020-07-05 (×2): 100 mg
  Filled 2020-07-05 (×2): qty 10

## 2020-07-05 MED ORDER — SODIUM CHLORIDE 0.9 % IV SOLN: 250.0000 mL | INTRAVENOUS | Status: DC

## 2020-07-05 MED ORDER — POLYETHYLENE GLYCOL 3350 17 G PO PACK
17.0000 g | PACK | Freq: Every day | ORAL | Status: DC
  Administered 2020-07-05: 17 g
  Filled 2020-07-05: qty 1

## 2020-07-05 MED ORDER — FUROSEMIDE 10 MG/ML IJ SOLN
40.0000 mg | Freq: Four times a day (QID) | INTRAMUSCULAR | Status: AC
  Administered 2020-07-05 – 2020-07-06 (×2): 40 mg via INTRAVENOUS
  Filled 2020-07-05 (×2): qty 4

## 2020-07-05 MED ORDER — SODIUM CHLORIDE 0.9 % IV SOLN
INTRAVENOUS | Status: DC | PRN
  Administered 2020-07-05: 500 mL

## 2020-07-05 MED ORDER — PROPOFOL 500 MG/50ML IV EMUL
INTRAVENOUS | Status: DC | PRN
  Administered 2020-07-05: 40 ug/kg/min via INTRAVENOUS

## 2020-07-05 MED ORDER — SODIUM CHLORIDE 0.9 % IV SOLN: INTRAVENOUS | Status: DC | PRN

## 2020-07-05 MED ORDER — ETOMIDATE 2 MG/ML IV SOLN
INTRAVENOUS | Status: DC | PRN
  Administered 2020-07-05: 13 mg via INTRAVENOUS

## 2020-07-05 MED ORDER — PHENYLEPHRINE 40 MCG/ML (10ML) SYRINGE FOR IV PUSH (FOR BLOOD PRESSURE SUPPORT)
PREFILLED_SYRINGE | INTRAVENOUS | Status: DC | PRN
  Administered 2020-07-05 (×2): 80 ug via INTRAVENOUS

## 2020-07-05 MED ORDER — INSULIN ASPART 100 UNIT/ML ~~LOC~~ SOLN
0.0000 [IU] | SUBCUTANEOUS | Status: DC
  Administered 2020-07-05: 2 [IU] via SUBCUTANEOUS
  Administered 2020-07-06: 5 [IU] via SUBCUTANEOUS
  Administered 2020-07-06: 9 [IU] via SUBCUTANEOUS
  Administered 2020-07-06 (×3): 3 [IU] via SUBCUTANEOUS
  Administered 2020-07-06: 2 [IU] via SUBCUTANEOUS
  Administered 2020-07-07: 3 [IU] via SUBCUTANEOUS
  Administered 2020-07-07: 2 [IU] via SUBCUTANEOUS
  Administered 2020-07-07: 5 [IU] via SUBCUTANEOUS
  Administered 2020-07-07: 3 [IU] via SUBCUTANEOUS
  Administered 2020-07-07: 2 [IU] via SUBCUTANEOUS
  Administered 2020-07-07 – 2020-07-08 (×5): 3 [IU] via SUBCUTANEOUS
  Administered 2020-07-08: 2 [IU] via SUBCUTANEOUS
  Administered 2020-07-08: 3 [IU] via SUBCUTANEOUS
  Administered 2020-07-09: 1 [IU] via SUBCUTANEOUS
  Administered 2020-07-09 (×3): 2 [IU] via SUBCUTANEOUS
  Administered 2020-07-09 (×2): 3 [IU] via SUBCUTANEOUS
  Administered 2020-07-10 (×2): 2 [IU] via SUBCUTANEOUS
  Administered 2020-07-10 (×2): 3 [IU] via SUBCUTANEOUS
  Administered 2020-07-10: 2 [IU] via SUBCUTANEOUS
  Administered 2020-07-10 – 2020-07-11 (×2): 3 [IU] via SUBCUTANEOUS
  Administered 2020-07-11: 2 [IU] via SUBCUTANEOUS
  Administered 2020-07-11 (×2): 3 [IU] via SUBCUTANEOUS
  Administered 2020-07-11 – 2020-07-12 (×3): 2 [IU] via SUBCUTANEOUS
  Administered 2020-07-12: 3 [IU] via SUBCUTANEOUS
  Administered 2020-07-12: 1 [IU] via SUBCUTANEOUS
  Administered 2020-07-12: 3 [IU] via SUBCUTANEOUS
  Administered 2020-07-13: 2 [IU] via SUBCUTANEOUS
  Administered 2020-07-13: 1 [IU] via SUBCUTANEOUS
  Administered 2020-07-13: 2 [IU] via SUBCUTANEOUS
  Administered 2020-07-13 – 2020-07-14 (×4): 3 [IU] via SUBCUTANEOUS
  Administered 2020-07-14: 2 [IU] via SUBCUTANEOUS
  Administered 2020-07-14 (×2): 1 [IU] via SUBCUTANEOUS
  Administered 2020-07-15: 2 [IU] via SUBCUTANEOUS
  Administered 2020-07-15: 1 [IU] via SUBCUTANEOUS
  Administered 2020-07-15 – 2020-07-16 (×4): 2 [IU] via SUBCUTANEOUS
  Administered 2020-07-16: 1 [IU] via SUBCUTANEOUS
  Administered 2020-07-16: 2 [IU] via SUBCUTANEOUS
  Administered 2020-07-16 – 2020-07-17 (×3): 1 [IU] via SUBCUTANEOUS

## 2020-07-05 MED ORDER — LACTATED RINGERS IV SOLN: INTRAVENOUS | Status: DC | PRN

## 2020-07-05 MED ORDER — PROPOFOL 10 MG/ML IV BOLUS
INTRAVENOUS | Status: AC
  Filled 2020-07-05: qty 20

## 2020-07-05 MED ORDER — FENTANYL CITRATE (PF) 250 MCG/5ML IJ SOLN
INTRAMUSCULAR | Status: DC | PRN
  Administered 2020-07-05 (×3): 50 ug via INTRAVENOUS

## 2020-07-05 MED ORDER — SUCCINYLCHOLINE CHLORIDE 200 MG/10ML IV SOSY
PREFILLED_SYRINGE | INTRAVENOUS | Status: DC | PRN
  Administered 2020-07-05: 140 mg via INTRAVENOUS

## 2020-07-05 MED ORDER — LACTATED RINGERS IV SOLN
INTRAVENOUS | Status: DC | PRN
Start: 2020-07-05 — End: 2020-07-05

## 2020-07-05 MED ORDER — FENTANYL CITRATE (PF) 100 MCG/2ML IJ SOLN
25.0000 ug | INTRAMUSCULAR | Status: DC | PRN
  Administered 2020-07-05: 25 ug via INTRAVENOUS
  Administered 2020-07-05: 100 ug via INTRAVENOUS
  Filled 2020-07-05 (×2): qty 2

## 2020-07-05 MED ORDER — PROPOFOL 1000 MG/100ML IV EMUL
5.0000 ug/kg/min | INTRAVENOUS | Status: DC
  Administered 2020-07-05: 40 ug/kg/min via INTRAVENOUS
  Filled 2020-07-05: qty 100

## 2020-07-05 MED ORDER — PHENYLEPHRINE HCL-NACL 10-0.9 MG/250ML-% IV SOLN
INTRAVENOUS | Status: DC | PRN
  Administered 2020-07-05: 45 ug/min via INTRAVENOUS

## 2020-07-05 MED ORDER — 0.9 % SODIUM CHLORIDE (POUR BTL) OPTIME
TOPICAL | Status: DC | PRN
  Administered 2020-07-05: 800 mL
  Administered 2020-07-05: 2000 mL

## 2020-07-05 MED ORDER — PHENYLEPHRINE HCL-NACL 10-0.9 MG/250ML-% IV SOLN
0.0000 ug/min | INTRAVENOUS | Status: DC
  Administered 2020-07-05 (×2): 50 ug/min via INTRAVENOUS
  Filled 2020-07-05: qty 250

## 2020-07-05 MED ORDER — ROCURONIUM BROMIDE 10 MG/ML (PF) SYRINGE
PREFILLED_SYRINGE | INTRAVENOUS | Status: DC | PRN
  Administered 2020-07-05: 70 mg via INTRAVENOUS

## 2020-07-05 MED ORDER — LIDOCAINE 2% (20 MG/ML) 5 ML SYRINGE
INTRAMUSCULAR | Status: DC | PRN
  Administered 2020-07-05: 40 mg via INTRAVENOUS

## 2020-07-05 MED ORDER — OXYCODONE HCL 5 MG/5ML PO SOLN
5.0000 mg | Freq: Once | ORAL | Status: DC | PRN
Start: 2020-07-05 — End: 2020-07-05

## 2020-07-05 MED ORDER — PROPOFOL 1000 MG/100ML IV EMUL
0.0000 ug/kg/min | INTRAVENOUS | Status: DC
  Administered 2020-07-05: 30 ug/kg/min via INTRAVENOUS

## 2020-07-05 MED ORDER — GUAIFENESIN-DM 100-10 MG/5ML PO SYRP: 15.0000 mL | ORAL_SOLUTION | ORAL | Status: DC | PRN

## 2020-07-05 MED ORDER — FENTANYL CITRATE (PF) 250 MCG/5ML IJ SOLN
INTRAMUSCULAR | Status: AC
  Filled 2020-07-05: qty 5

## 2020-07-05 MED ORDER — OXYCODONE HCL 5 MG PO TABS: 5.0000 mg | ORAL_TABLET | Freq: Once | ORAL | Status: DC | PRN

## 2020-07-05 MED ORDER — ONDANSETRON HCL 4 MG/2ML IJ SOLN: 4.0000 mg | Freq: Once | INTRAMUSCULAR | Status: DC | PRN

## 2020-07-05 MED ORDER — CHLORHEXIDINE GLUCONATE 0.12% ORAL RINSE (MEDLINE KIT)
15.0000 mL | Freq: Two times a day (BID) | OROMUCOSAL | Status: DC
  Administered 2020-07-05 – 2020-07-11 (×10): 15 mL via OROMUCOSAL

## 2020-07-05 MED ORDER — ALBUMIN HUMAN 5 % IV SOLN: INTRAVENOUS | Status: DC | PRN

## 2020-07-05 MED ORDER — INSULIN REGULAR(HUMAN) IN NACL 100-0.9 UT/100ML-% IV SOLN
INTRAVENOUS | Status: DC | PRN
  Administered 2020-07-05: 18 [IU]/h via INTRAVENOUS

## 2020-07-05 MED ORDER — CALCIUM GLUCONATE-NACL 1-0.675 GM/50ML-% IV SOLN: 1.0000 g | Freq: Once | INTRAVENOUS | Status: DC

## 2020-07-05 MED ORDER — OSMOLITE 1.5 CAL PO LIQD
1000.0000 mL | ORAL | Status: DC
  Administered 2020-07-05 – 2020-07-15 (×9): 1000 mL
  Filled 2020-07-05 (×13): qty 1000

## 2020-07-05 MED ORDER — FENTANYL CITRATE (PF) 100 MCG/2ML IJ SOLN: 25.0000 ug | INTRAMUSCULAR | Status: DC | PRN

## 2020-07-05 MED ORDER — CEFAZOLIN SODIUM-DEXTROSE 2-3 GM-%(50ML) IV SOLR
INTRAVENOUS | Status: DC | PRN
  Administered 2020-07-05: 2 g via INTRAVENOUS

## 2020-07-05 MED ORDER — PHENYLEPHRINE HCL-NACL 10-0.9 MG/250ML-% IV SOLN
25.0000 ug/min | INTRAVENOUS | Status: DC
  Filled 2020-07-05: qty 250

## 2020-07-05 MED ORDER — INSULIN REGULAR(HUMAN) IN NACL 100-0.9 UT/100ML-% IV SOLN
INTRAVENOUS | Status: DC
  Filled 2020-07-05: qty 100

## 2020-07-05 SURGICAL SUPPLY — 62 items
BANDAGE ESMARK 6X9 LF (GAUZE/BANDAGES/DRESSINGS) IMPLANT
BNDG ELASTIC 4X5.8 VLCR STR LF (GAUZE/BANDAGES/DRESSINGS) IMPLANT
BNDG ESMARK 6X9 LF (GAUZE/BANDAGES/DRESSINGS)
CANISTER SUCT 3000ML PPV (MISCELLANEOUS) ×2 IMPLANT
CANNULA VESSEL 3MM 2 BLNT TIP (CANNULA) IMPLANT
CLIP VESOCCLUDE MED 24/CT (CLIP) ×2 IMPLANT
CLIP VESOCCLUDE SM WIDE 24/CT (CLIP) ×2 IMPLANT
CONNECTOR Y ATS VAC SYSTEM (MISCELLANEOUS) ×2 IMPLANT
COVER WAND RF STERILE (DRAPES) ×2 IMPLANT
CUFF TOURN SGL QUICK 24 (TOURNIQUET CUFF)
CUFF TOURN SGL QUICK 34 (TOURNIQUET CUFF)
CUFF TOURN SGL QUICK 42 (TOURNIQUET CUFF) IMPLANT
CUFF TRNQT CYL 24X4X16.5-23 (TOURNIQUET CUFF) IMPLANT
CUFF TRNQT CYL 34X4.125X (TOURNIQUET CUFF) IMPLANT
DRAIN CHANNEL 15F RND FF W/TCR (WOUND CARE) IMPLANT
DRAIN PENROSE 3/4X12 (DRAIN) IMPLANT
DRAPE X-RAY CASS 24X20 (DRAPES) IMPLANT
DRSG VAC ATS MED SENSATRAC (GAUZE/BANDAGES/DRESSINGS) ×2 IMPLANT
DRSG VAC ATS SM SENSATRAC (GAUZE/BANDAGES/DRESSINGS) ×2 IMPLANT
ELECT REM PT RETURN 9FT ADLT (ELECTROSURGICAL) ×2
ELECTRODE REM PT RTRN 9FT ADLT (ELECTROSURGICAL) ×1 IMPLANT
EVACUATOR SILICONE 100CC (DRAIN) IMPLANT
GAUZE SPONGE 4X4 12PLY STRL (GAUZE/BANDAGES/DRESSINGS) ×2 IMPLANT
GLOVE BIO SURGEON STRL SZ7.5 (GLOVE) ×4 IMPLANT
GLOVE SRG 8 PF TXTR STRL LF DI (GLOVE) ×1 IMPLANT
GLOVE SURG UNDER POLY LF SZ6.5 (GLOVE) ×14 IMPLANT
GLOVE SURG UNDER POLY LF SZ8 (GLOVE) ×2
GOWN STRL REUS W/ TWL LRG LVL3 (GOWN DISPOSABLE) ×6 IMPLANT
GOWN STRL REUS W/TWL LRG LVL3 (GOWN DISPOSABLE) ×12
KIT BASIN OR (CUSTOM PROCEDURE TRAY) ×2 IMPLANT
KIT REMOVER STAPLE SKIN (MISCELLANEOUS) ×2 IMPLANT
KIT TURNOVER KIT B (KITS) ×2 IMPLANT
MARKER GRAFT CORONARY BYPASS (MISCELLANEOUS) IMPLANT
NS IRRIG 1000ML POUR BTL (IV SOLUTION) ×6 IMPLANT
PACK PERIPHERAL VASCULAR (CUSTOM PROCEDURE TRAY) ×2 IMPLANT
PAD ARMBOARD 7.5X6 YLW CONV (MISCELLANEOUS) ×4 IMPLANT
PATCH VASC XENOSURE 1CMX6CM (Vascular Products) ×1 IMPLANT
PATCH VASC XENOSURE 1X6 (Vascular Products) ×1 IMPLANT
SET COLLECT BLD 21X3/4 12 (NEEDLE) IMPLANT
SPONGE LAP 18X18 RF (DISPOSABLE) ×2 IMPLANT
SPONGE SURGIFOAM ABS GEL 100 (HEMOSTASIS) IMPLANT
STAPLER VISISTAT (STAPLE) IMPLANT
STOPCOCK 4 WAY LG BORE MALE ST (IV SETS) IMPLANT
SUT ETHIBOND 5 LR DA (SUTURE) IMPLANT
SUT ETHILON 3 0 PS 1 (SUTURE) IMPLANT
SUT PROLENE 5 0 C 1 24 (SUTURE) ×10 IMPLANT
SUT PROLENE 6 0 BV (SUTURE) ×4 IMPLANT
SUT PROLENE 7 0 BV 1 (SUTURE) IMPLANT
SUT SILK 2 0 PERMA HAND 18 BK (SUTURE) ×2 IMPLANT
SUT SILK 3 0 (SUTURE)
SUT SILK 3-0 18XBRD TIE 12 (SUTURE) IMPLANT
SUT VIC AB 2-0 CT1 27 (SUTURE) ×1
SUT VIC AB 2-0 CT1 TAPERPNT 27 (SUTURE) ×1 IMPLANT
SUT VIC AB 2-0 CTB1 (SUTURE) ×4 IMPLANT
SUT VIC AB 3-0 SH 27 (SUTURE) ×10
SUT VIC AB 3-0 SH 27X BRD (SUTURE) ×5 IMPLANT
SUT VIC AB 4-0 PS2 27 (SUTURE) ×2 IMPLANT
SUT VICRYL 4-0 PS2 18IN ABS (SUTURE) ×4 IMPLANT
TOWEL GREEN STERILE (TOWEL DISPOSABLE) ×4 IMPLANT
TUBING EXTENTION W/L.L. (IV SETS) IMPLANT
UNDERPAD 30X36 HEAVY ABSORB (UNDERPADS AND DIAPERS) ×2 IMPLANT
WATER STERILE IRR 1000ML POUR (IV SOLUTION) ×2 IMPLANT

## 2020-07-05 NOTE — Progress Notes (Signed)
NAME:  Holly Stevenson, MRN:  [id number], DOB:  September 12, 1950, LOS: 21 ADMISSION DATE:  06/14/2020, CONSULTATION DATE:  06/16/20 REFERRING MD:  Randie Heinz  CHIEF COMPLAINT:  Vent Management   Brief History   70 y/o admitted 12/29 for R groin graft debridement with concerns for infection / dehiscence.  She has had multiple vascular surgeries, most recently L common femoral and external iliac endarterectomy with vein patch angioplasty, bilateral common iliac artery stenting, stenting of left external iliac artery, L popliteal artery endarterectomy, L common femoral to above knee popliteal bypass, L lower extremity thromboembolectomy on 12/31.  She was left intubated post op and PCCM asked to assist with vent management. 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    Of note, she had navigational bronchoscopy of RUL mass 12/21 with Dr. Delton Coombes.  Path was negative for malignancy but showed granulomas of unclear etiology.  Culture positive for mycobacterium kansasii.    Past Medical History  has Diabetic infection of left foot (HCC); DM2 (diabetes mellitus, type 2) (HCC); CKD (chronic kidney disease) stage 3, GFR 30-59 ml/min (HCC); HTN (hypertension); Cavitary lesion of lung; Cellulitis of toe of left foot; Idiopathic chronic gout of left knee without tophus; Gangrene of toe of left foot (HCC); PAD (peripheral artery disease) (HCC); Critical lower limb ischemia (HCC); COPD (chronic obstructive pulmonary disease) (HCC); Diabetic renal disease (HCC); Hyperlipidemia; Personal history of colonic polyps; Polyneuropathy; Tobacco user; Infected prosthetic vascular graft (HCC); Status post surgery; Respiratory failure (HCC); and Pressure injury of skin on their problem list.  Significant Hospital Events   12/16 Exploration of right groin and control of hemorrhage, thrombectomy right to left femoral bypass graft, interposition grafting right common femoral artery to femoral femoral  bypass, thrombectomy left femoral popliteal bypass graft, angiogram LLE. 12/21 EBUS of RUL > granulomas. 12/29 Sharp excisional debridement right groin wound, pulsavac irrigation right groin wound, interposition graft of femorofemoral bypass. 12/30 Excision of right to left femorofemoral bypass, right common femoral bovine pericardial patch and left femoral to popliteal artery bypass graft with reopening of left groin and left above knee popliteal exposure, harvest of right greater saphenous vein, patch angioplasty of right common femoral artery, left common femoral endarterectomy and saphenous vein angioplasty, left popliteal artery vein angioplasty, aortogram, stent of left common and external iliac veins, stent of L SFA, sartorius muscle flap right groin wound. 12/31 L common femoral and external iliac endarterectomy with vein patch angioplasty, harvest L greater saphenous vein, aortogram with bilateral iliofemoral runoff,  bilateral common iliac artery stenting, stenting of left external iliac artery, L popliteal artery endarterectomy, L common femoral to above knee popliteal bypass, L lower extremity thromboembolectomy 1/02 L AKA for gangrene 1/19 Exploration of R groin for bleeding   Consults:     Procedures:  ETT 1/19 >>  L IJ TLC 1/19 >>  L Radial Aline 1/19 >>   Significant Diagnostic Tests:   CTA Chest 1/11 >> negative for PE, development of multifocal GGO, new bilateral pleural effusion, cavitary RUL processs, CAD  Micro Data:  COVID 12/29 > neg R Groin Wound Surgical Culture 12/29 >> enterococcus faecalis, rare e-coli  BCx2 1/20 >>   Antimicrobials:  Ampicillin 1/3 >> 1/11  Cefepime 1/17 >>   Interim history/subjective:  Seen post op - RN reports EBL 100 ml, on vent, VAC in place to R groin, on propofol / neosynphrine / insulin gtt  Objective:  Blood pressure (!) 111/43, pulse (!) 53, temperature 98.4  F (36.9 C), resp. rate 16, height 5\' 8"  (1.727 m), weight 99.3 kg,  SpO2 100 %.    Vent Mode: PRVC FiO2 (%):  [40 %] 40 % Set Rate:  [16 bmp] 16 bmp Vt Set:  [600 mL] 600 mL PEEP:  [5 cmH20] 5 cmH20   Intake/Output Summary (Last 24 hours) at 07/05/2020 1132 Last data filed at 07/05/2020 07/07/2020 Gross per 24 hour  Intake 2610 ml  Output 2860 ml  Net -250 ml   Filed Weights   06/17/20 0339 06/21/20 1432 07/01/20 0409  Weight: 87.9 kg 87.9 kg 99.3 kg    Examination: General: chronically ill  HEENT: MM pink/moist, ETT in place, pupils 104mm / reactive, L IJ central line  Neuro: sedate on propofol  CV: s1s2 RRR, SB on monitor , no m/r/g PULM: non-labored on vent, not breathing over set rate / sedate, clear breath sounds bilaterally  GI: soft, bsx4 active  Extremities: warm/dry, L AKA with staples / edema, darkening at suture line, RLE with surgical incisions c/d/i, staples intact, R groin VAC in place c/d/i Skin: no rashes or lesions   Assessment & Plan:   Infected Right Femoral Graft, R Groin Bleeding  -pos operative care per VVS -follow extremity exam closely  -continue abx as above  Acute LLE Ischemia s/p AKA, 12/31 S/p OR 12/31.  She has had multiple extensive vascular surgeries on bilateral LE's with significant blood loss s/p multiple transfusions.   -per VVS  ABLA In setting above, s/p multiple transfusions.  Latest Hgb from 5.4, s/p 4 units PRBC in OR 1/19 -follow up CBC post op  -transfuse for Hgb <7%   Acute Respiratory Insufficiency  -PRVC 8cc/kg  -wean PEEP / FiO2 for sats >90% -assess CXR intermittently, images reviewed post OR, addvance ETT 2cm -daily SBT / WUA   Sedation Needs while on Mechanical Ventilation  -PAD protocol with propofol + PRN fentanyl  -RASS goal 0 to -1   Hypotension  -neosynephrine for MAP >65 -goal euvolemia  -discontinue lasix for 1/19, reassess 1/20 for restart   Hx HTN, HLD -hold home benazepril with hypotension / post-op -PRN hydralazine, labetalol if needed -continue atorvastatin  DM with  Hyperglycemia  -discontinue insulin gtt  -SSI, sensitive scale Q4 -lantus 10 units QD  -hold home metformin   Hypocalcemia. -repeat labs now  RUL Cavitary Lesion, Mycobacterium Kansasii  Negative pathology 12/21.   -appreciate ID input  -continue therapy  -consider follow up PET as outpatient  -follow up with Dr. 1/22 at discharge   At Risk Malnutrition  -resume TF   Best Practice:  Diet: NPO Pain/Anxiety/Delirium protocol (if indicated): Propofol gtt / Fentanyl PRN.  VAP protocol (if indicated): In place. DVT prophylaxis: Heparin. GI prophylaxis: PPI. Glucose control: SSI. Mobility: Bedrest. Last date of multidisciplinary goals of care discussion: Pending Family and staff present:  Summary of discussion: Follow up goals of care discussion due: pending Code Status: DNR  Family Communication: Per primary. Disposition: ICU.  Labs   CBC: Recent Labs  Lab 07/01/20 0120 07/02/20 0128 07/03/20 07/05/20 07/04/20 0130 07/05/20 0158 07/05/20 0741 07/05/20 0746 07/05/20 0839 07/05/20 0846  WBC 12.9* 13.9* 18.0* 11.2* 10.2  --   --   --   --   HGB 7.6* 7.9* 8.3* 7.0* 7.1* 5.4* 6.1* 10.2* 10.2*  HCT 25.8* 25.4* 27.8* 22.6* 23.6* 16.0* 18.0* 30.0* 30.0*  MCV 93.8 92.7 94.9 92.6 93.3  --   --   --   --   PLT 223  255 298 237 228  --   --   --   --    Basic Metabolic Panel: Recent Labs  Lab 07/01/20 0120 07/02/20 0128 07/03/20 0455 07/04/20 0130 07/05/20 0158 07/05/20 0741 07/05/20 0746 07/05/20 0839 07/05/20 0846  NA 136 140 139 138 137 137 137 138 139  K 4.3 4.5 4.7 4.1 3.9 5.0 5.0 4.5 4.2  CL 102 104 99 98 96* 95*  --  96*  --   CO2 23 27 31 31 31   --   --   --   --   GLUCOSE 298* 278* 292* 244* 242* 290*  --  202*  --   BUN 22 26* 36* 36* 28* 33*  --  26*  --   CREATININE 0.62 0.68 0.62 0.56 0.53 0.50  --  0.50  --   CALCIUM 7.7* 7.8* 8.1* 7.6* 7.7*  --   --   --   --    GFR: Estimated Creatinine Clearance: 80.7 mL/min (by C-G formula based on SCr of 0.5  mg/dL). Recent Labs  Lab 07/02/20 0128 07/03/20 0953 07/04/20 0130 07/05/20 0158  WBC 13.9* 18.0* 11.2* 10.2   Liver Function Tests: Recent Labs  Lab 07/03/20 0455  AST 37  ALT 37  ALKPHOS 116  BILITOT 0.7  PROT 6.4*  ALBUMIN 1.5*   No results for input(s): LIPASE, AMYLASE in the last 168 hours. No results for input(s): AMMONIA in the last 168 hours. ABG    Component Value Date/Time   PHART 7.392 07/05/2020 0846   PCO2ART 56.8 (H) 07/05/2020 0846   PO2ART 259 (H) 07/05/2020 0846   HCO3 34.5 (H) 07/05/2020 0846   TCO2 36 (H) 07/05/2020 0846   ACIDBASEDEF 9.0 (H) 06/16/2020 0608   O2SAT 100.0 07/05/2020 0846    Coagulation Profile: No results for input(s): INR, PROTIME in the last 168 hours. Cardiac Enzymes: No results for input(s): CKTOTAL, CKMB, CKMBINDEX, TROPONINI in the last 168 hours. HbA1C: Hgb A1c MFr Bld  Date/Time Value Ref Range Status  05/11/2020 04:26 AM 7.2 (H) 4.8 - 5.6 % Final    Comment:    (NOTE) Pre diabetes:          5.7%-6.4%  Diabetes:              >6.4%  Glycemic control for   <7.0% adults with diabetes   04/25/2020 05:44 AM 7.3 (H) 4.8 - 5.6 % Final    Comment:    (NOTE) Pre diabetes:          5.7%-6.4%  Diabetes:              >6.4%  Glycemic control for   <7.0% adults with diabetes    CBG: Recent Labs  Lab 07/04/20 2055 07/05/20 0611 07/05/20 0920 07/05/20 1022 07/05/20 1123  GLUCAP 227* 267* 119* 128* 99   CRITICAL CARE  CC Time: 35 minutes   07/07/20, MSN, NP-C, AGACNP-BC [facility] 07/05/2020, 12:19 PM   Please see [email or web address] for pager details.

## 2020-07-05 NOTE — Progress Notes (Signed)
VASCULAR SURGERY ASSESSMENT & PLAN:   BLEEDING RIGHT GROIN: This patient developed significant bleeding suddenly from the right groin VAC.  The outer part of the VAC was changed and she continued to bleed with a large amount of blood in the bed.  I remove the VAC and packed the wound.  At this point it is simply oozing but given that she had an underlying infection with bovine pericardial patch I have recommended emergent exploration in the operating room.  I have discussed this with the patient and her daughter.  ACUTE BLOOD LOSS ANEMIA: The patients hemoglobin is 7.1 this morning.  We will transfuse 2 units urgently.  This patient has a complicated history as outlined below:  06/01/2020: The patient had exploration of the right groin for bleeding and thrombectomy of a femoral-femoral bypass graft.  This was revised and also she had thrombectomy of a left femoropopliteal bypass. 06/14/2020: The patient had excisional debridement of the right groin wound and an interposition graft to her femorofemoral bypass with PTFE 06/15/2020: The patient had excision of her right to left femorofemoral bypass, bovine pericardial patch angioplasty of right common femoral artery I believe excision of the left femoropopliteal bypass with vein patch angioplasty of the right common femoral artery using great saphenous vein patient also had a left common femoral artery endarterectomy with saphenous vein patch and left popliteal artery angioplasty.  Patient also had stents in the left common and external iliac arteries and also in the left superficial femoral artery patient had a sartorius muscle flap in the right groin.  SUBJECTIVE:   Complains of pain in the right groin.  PHYSICAL EXAM:   Vitals:   07/05/20 0632 07/05/20 0636 07/05/20 0641 07/05/20 0650  BP: (!) 132/56 (!) 125/59 (!) 114/52 (!) 130/59  Pulse: 73 74 76 86  Resp: (!) 24 (!) 25 (!) 24 (!) 24  Temp:   97.8 F (36.6 C) 97.8 F (36.6 C)   TempSrc:   Oral Oral  SpO2: 100% 100% 100% 100%  Weight:      Height:       Large amount of bleeding from the right groin VAC.  This was removed and packed.  LABS:   Lab Results  Component Value Date   WBC 10.2 07/05/2020   HGB 7.1 (L) 07/05/2020   HCT 23.6 (L) 07/05/2020   MCV 93.3 07/05/2020   PLT 228 07/05/2020   Lab Results  Component Value Date   CREATININE 0.53 07/05/2020   Lab Results  Component Value Date   INR 1.0 05/03/2020   CBG (last 3)  Recent Labs    07/04/20 1557 07/04/20 2055 07/05/20 0611  GLUCAP 192* 227* 267*    PROBLEM LIST:    Active Problems:   Infected prosthetic vascular graft (HCC)   Status post surgery   Respiratory failure (HCC)   Pressure injury of skin   CURRENT MEDS:   . sodium chloride   Intravenous Once  . sodium chloride   Intravenous Once  . acetaminophen (TYLENOL) oral liquid 160 mg/5 mL  650 mg Per Tube Q6H  . acetaminophen  650 mg Oral Q6H  . aspirin  81 mg Per Tube Daily  . atorvastatin  40 mg Per Tube Daily  . benazepril  40 mg Per Tube Daily  . Chlorhexidine Gluconate Cloth  6 each Topical Daily  . cholecalciferol  1,000 Units Per Tube Daily  . docusate  100 mg Per Tube BID  . enoxaparin (LOVENOX) injection  40  mg Subcutaneous Q24H  . ethambutol  1,200 mg Per Tube Daily  . feeding supplement (GLUCERNA SHAKE)  237 mL Oral TID BM  . furosemide  20 mg Intravenous Q6H  . insulin aspart  0-5 Units Subcutaneous QHS  . insulin aspart  0-9 Units Subcutaneous TID WC  . insulin glargine  10 Units Subcutaneous BID  . ipratropium-albuterol  3 mL Nebulization QID  . isoniazid  300 mg Per Tube Daily  . multivitamin  15 mL Per Tube Daily  . OLANZapine  2.5 mg Per Tube QHS  . pantoprazole sodium  40 mg Per Tube Daily  . polyethylene glycol  17 g Per Tube Daily  . vitamin B-6  50 mg Per Tube Daily  . rifampin  600 mg Per Tube Daily  . senna-docusate  1 tablet Per Tube BID    Waverly Ferrari Office:  [phone] 07/05/2020

## 2020-07-05 NOTE — Anesthesia Procedure Notes (Signed)
Procedure Name: Intubation Date/Time: 07/05/2020 7:33 AM Performed by: Darletta Moll, CRNA Pre-anesthesia Checklist: Patient identified, Emergency Drugs available, Suction available and Patient being monitored Patient Re-evaluated:Patient Re-evaluated prior to induction Oxygen Delivery Method: Circle system utilized Preoxygenation: Pre-oxygenation with 100% oxygen Induction Type: IV induction, Rapid sequence and Cricoid Pressure applied Laryngoscope Size: Mac and 3 Grade View: Grade I Tube type: Oral Tube size: 7.0 mm Number of attempts: 1 Airway Equipment and Method: Stylet Placement Confirmation: ETT inserted through vocal cords under direct vision,  positive ETCO2 and breath sounds checked- equal and bilateral Secured at: 21 cm Tube secured with: Tape Dental Injury: Teeth and Oropharynx as per pre-operative assessment

## 2020-07-05 NOTE — Progress Notes (Addendum)
Patient resting in bed Wound Vac. Alarming Cansiter was full of blood and right groin bleeding large amt. Of blood Pressure applied [facility]. called and into assess patient. Rapid Response Phyllis Ginger called and coming to see patient. Dr. Edilia Bo was on floor and came into see patient. .  Skin warm and dry V.S.S. and done freq. No complaints voiced. Dr. Edilia Bo removed wound vac and applied pressure dressing. Daughter was called by Dr. Edilia Bo and made aware. Dr. Edilia Bo spoke with patient about need for surgery patient was in agreement and signed permit also witness by Onalee Hua Rapid Response nurse. Stat t/s order and lab called. Groin has decrease in bleeding. Patient tol., well. Anesth. Was called report, and patient was transferred to O.R. with high flow 02 and monitor with Rapid Response R.N. x 2.

## 2020-07-05 NOTE — Op Note (Signed)
    NAME: Holly Stevenson    MRN: [id number] DOB: 1950/07/02    DATE OF OPERATION: 07/05/2020  PREOP DIAGNOSIS:    Bleeding from right femoral artery patch  POSTOP DIAGNOSIS:    Same  PROCEDURE:    Repair of right common femoral artery patch with pledgeted 5-0 sutures Redo sartorius muscle flap  SURGEON: Di Kindle. Edilia Bo, MD  ASSIST: Clotilde Dieter, MD, [place], [place]  ANESTHESIA: General  EBL: 100 cc  INDICATIONS:    Holly Stevenson is a 70 y.o. female with an extensive history as outlined in my note this morning.  She had a great saphenous vein patch on the common femoral artery with a sartorius flap over this in addition to a VAC.  She developed significant bleeding this morning from the groin and was taken urgently to the operating room for exploration  FINDINGS:   There was an area of bleeding from the 7 o'clock position of the patch which was repaired with multiple pledgeted 5-0 Prolene sutures.  Good hemostasis was obtained.  TECHNIQUE:   The patient was taken urgently to the operating for exploration of the right groin for bleeding.  Both groins and the entire right lower extremity were prepped and draped in usual sterile fashion.  I took the sartorius muscle flap down where it was attached to the inguinal ligament and then rotated the muscle laterally to allow exposure of the common femoral artery and the vein patch.  The clot overlying the patch was removed and at that point it was clear that the bleeding was from the distal lateral wall of the patch at the 7 o'clock position.  Digital pressure was held for hemostasis.  Proximal control was obtained using a sponge stick.  There was no available saphenous vein available to use for pledgets and therefore I used bovine pericardial patch for pledgets.  3 pledgeted 5-0 Prolene sutures were placed in the area of concern.  In addition I ran a 5-0 Prolene along this area.  At this point good hemostasis was  obtained.  Dr. Chestine Spore was assisting on the case and given that I needed to get to the office he agreed to complete the procedure which involved covering the common femoral artery with a sartorius flap.  He fully mobilized the muscle laterally allowing it to be rotated medially to cover the common femoral artery and patch.  It was tacked with 3-0 Prolene's.  The VAC was then reapplied.  Patient tolerated procedure well.  She remained intubated so was transferred to the intensive care unit at this point.  Given the complexity of the case a first assistant was necessary in order to expedient the procedure and safely perform the technical aspects of the operation.  Holly Ferrari, MD, FACS [facility]  DATE OF DICTATION:   07/05/2020

## 2020-07-05 NOTE — Anesthesia Procedure Notes (Signed)
Central Venous Catheter Insertion Performed by: Kipp Brood, MD, anesthesiologist Start/End1/19/2022 7:25 AM, 07/05/2020 7:30 AM Patient location: Pre-op. Preanesthetic checklist: patient identified, IV checked, site marked, risks and benefits discussed, surgical consent, monitors and equipment checked, pre-op evaluation, timeout performed and anesthesia consent Lidocaine 1% used for infiltration and patient sedated Hand hygiene performed  and maximum sterile barriers used  Catheter size: 8 Fr Total catheter length 16. Central line was placed.Double lumen Procedure performed using ultrasound guided technique. Ultrasound Notes:image(s) printed for medical record Attempts: 1 Following insertion, dressing applied and line sutured. Post procedure assessment: blood return through all ports  Patient tolerated the procedure well with no immediate complications.

## 2020-07-05 NOTE — Progress Notes (Signed)
PT Cancellation Note  Patient Details Name: Holly Stevenson MRN: [id number] DOB: [birth year]   Cancelled Treatment:    Reason Eval/Treat Not Completed: Patient at procedure or test/unavailable. Pt emergently taken to OR due to excessive bleeding R groin wound vac site.    Ilda Foil 07/05/2020, 8:13 AM  Aida Raider, PT  Office # [phone] Pager [phone]

## 2020-07-05 NOTE — Anesthesia Procedure Notes (Signed)
Arterial Line Insertion Start/End1/19/2022 7:30 AM, 07/05/2020 7:40 AM Performed by: Dorie Rank, CRNA  Preanesthetic checklist: patient identified, IV checked, risks and benefits discussed, surgical consent, pre-op evaluation and timeout performed Patient sedated Left, radial was placed Catheter size: 20 G Hand hygiene performed , maximum sterile barriers used  and Seldinger technique used Allen's test indicative of satisfactory collateral circulation Attempts: 2 Procedure performed without using ultrasound guided technique. Following insertion, Biopatch and dressing applied. Post procedure assessment: normal  Patient tolerated the procedure well with no immediate complications.

## 2020-07-05 NOTE — Progress Notes (Signed)
Patient to be taken to PACU from OR d/t unavailable staffing in ICU.

## 2020-07-05 NOTE — Transfer of Care (Signed)
Immediate Anesthesia Transfer of Care Note  Patient: Holly Stevenson  Procedure(s) Performed: EXPLORATION OF RIGHT GROIN WITH REPAIR OF FEMORAL ARTERY (Right )  Patient Location: PACU  Anesthesia Type:General  Level of Consciousness: sedated and Patient remains intubated per anesthesia plan  Airway & Oxygen Therapy: Patient remains intubated per anesthesia plan and Patient placed on Ventilator (see vital sign flow sheet for setting)  Post-op Assessment: Report given to RN and Post -op Vital signs reviewed and stable  Post vital signs: Reviewed and stable  Last Vitals:  Vitals Value Taken Time  BP 108/59 07/05/20 0916  Temp    Pulse 57 07/05/20 0925  Resp 16 07/05/20 0925  SpO2 93 % 07/05/20 0925  Vitals shown include unvalidated device data.  Last Pain:  Vitals:   07/05/20 0650  TempSrc: Oral  PainSc:       Patients Stated Pain Goal: 3 (07/03/20 2156)  Complications: No complications documented.

## 2020-07-05 NOTE — Procedures (Signed)
Insertion of Chest Tube Procedure Note  Holly Stevenson  [id number]  December 12, 1950  Date:07/05/20  Time:5:51 PM    Provider Performing: Lorin Glass   Procedure: Pleural Catheter Insertion w/ Imaging Guidance (07371)  Indication(s) Effusion  Consent Risks of the procedure as well as the alternatives and risks of each were explained to the patient and/or caregiver.  Consent for the procedure was obtained and is signed in the bedside chart  Anesthesia Topical only with 1% lidocaine    Time Out Verified patient identification, verified procedure, site/side was marked, verified correct patient position, special equipment/implants available, medications/allergies/relevant history reviewed, required imaging and test results available.   Sterile Technique Maximal sterile technique including full sterile barrier drape, hand hygiene, sterile gown, sterile gloves, mask, hair covering, sterile ultrasound probe cover (if used).   Procedure Description Ultrasound used to identify appropriate pleural anatomy for placement and overlying skin marked. Area of placement cleaned and draped in sterile fashion.  A 14 French pigtail pleural catheter was placed into the right pleural space using Seldinger technique. Appropriate return of fluid was obtained.  The tube was connected to atrium and placed on -20 cm H2O wall suction.   Complications/Tolerance None; patient tolerated the procedure well. Chest X-ray is ordered to verify placement.   EBL Minimal  Specimen(s) fluid

## 2020-07-05 NOTE — Anesthesia Preprocedure Evaluation (Signed)
Anesthesia Evaluation  Patient identified by MRN, date of birth, ID band Patient awake    Reviewed: NPO status , Patient's Chart, lab work & pertinent test results  Airway Mallampati: II  TM Distance: >3 FB     Dental  (+) Partial Upper, Edentulous Lower   Pulmonary Current Smoker,    breath sounds clear to auscultation       Cardiovascular hypertension,  Rhythm:Regular Rate:Normal     Neuro/Psych    GI/Hepatic   Endo/Other  diabetes  Renal/GU      Musculoskeletal   Abdominal   Peds  Hematology   Anesthesia Other Findings Patient agitated complaining of severe LE pain.   Reproductive/Obstetrics                             Anesthesia Physical Anesthesia Plan  ASA: IV and emergent  Anesthesia Plan: General   Post-op Pain Management:    Induction: Intravenous, Cricoid pressure planned and Rapid sequence  PONV Risk Score and Plan: Ondansetron  Airway Management Planned: Oral ETT  Additional Equipment: Arterial line and CVP  Intra-op Plan:   Post-operative Plan: Post-operative intubation/ventilation  Informed Consent: I have reviewed the patients History and Physical, chart, labs and discussed the procedure including the risks, benefits and alternatives for the proposed anesthesia with the patient or authorized representative who has indicated his/her understanding and acceptance.       Plan Discussed with: CRNA and Anesthesiologist  Anesthesia Plan Comments: (70 year old female S/P multiple vascular surgeries and history of respiratory failure on nightly BiPAP. Now with bleeding R. Femoral artery graft. Plan GA with rapid sequence induction with central line and art line insertion. Will use emergency release blood due to lack of active cross match.  Kipp Brood)        Anesthesia Quick Evaluation

## 2020-07-05 NOTE — Procedures (Signed)
Extubation Procedure Note  Patient Details:   Name: Holly Stevenson DOB: 02/13/1951 MRN: [id number]   Airway Documentation:    Vent end date: 07/05/20 Vent end time: 1536   Evaluation  O2 sats: stable throughout Complications: No apparent complications Patient did tolerate procedure well. Bilateral Breath Sounds: Clear,Diminished   Pt extubated to 4L [place] per MD order. Pt had positive cuff leak prior to extubation. No stridor noted. Pt able to voice her name.   Guss Bunde 07/05/2020, 3:37 PM

## 2020-07-05 NOTE — Progress Notes (Signed)
Patient in bed and wound vac. alarming" leaking" check rt groin and blood had come threw wd. Vac. Dressing and had clots on top of the foam. Holly Stevenson. into assess and changed op site and tubing did not take out foam sponge. Reconnected to wd. Vac and working fine now. It is draining serous fl. not

## 2020-07-05 NOTE — Progress Notes (Signed)
SLP Cancellation Note  Patient Details Name: Holly Stevenson MRN: [id number] DOB: 1951-02-25   Cancelled treatment:       Reason Eval/Treat Not Completed: Medical issues which prohibited therapy. Pt taken emergently to OR this morning. She is now in the ICU on the vent post-operatively. Will f/u as able.    Mahala Menghini., M.A. CCC-SLP Acute Rehabilitation Services Pager [phone] Office [phone]  07/05/2020, 1:49 PM

## 2020-07-06 ENCOUNTER — Inpatient Hospital Stay (HOSPITAL_COMMUNITY): Payer: BC Managed Care – PPO

## 2020-07-06 ENCOUNTER — Telehealth: Payer: Self-pay | Admitting: Emergency Medicine

## 2020-07-06 ENCOUNTER — Encounter (HOSPITAL_COMMUNITY): Payer: Self-pay | Admitting: Vascular Surgery

## 2020-07-06 DIAGNOSIS — T827XXS Infection and inflammatory reaction due to other cardiac and vascular devices, implants and grafts, sequela: Secondary | ICD-10-CM | POA: Diagnosis not present

## 2020-07-06 DIAGNOSIS — J90 Pleural effusion, not elsewhere classified: Secondary | ICD-10-CM

## 2020-07-06 DIAGNOSIS — J9601 Acute respiratory failure with hypoxia: Secondary | ICD-10-CM | POA: Diagnosis not present

## 2020-07-06 LAB — COMPREHENSIVE METABOLIC PANEL
ALT: 71 U/L — ABNORMAL HIGH (ref 0–44)
AST: 137 U/L — ABNORMAL HIGH (ref 15–41)
Albumin: 1.3 g/dL — ABNORMAL LOW (ref 3.5–5.0)
Alkaline Phosphatase: 77 U/L (ref 38–126)
Anion gap: 9 (ref 5–15)
BUN: 27 mg/dL — ABNORMAL HIGH (ref 8–23)
CO2: 31 mmol/L (ref 22–32)
Calcium: 7 mg/dL — ABNORMAL LOW (ref 8.9–10.3)
Chloride: 98 mmol/L (ref 98–111)
Creatinine, Ser: 0.65 mg/dL (ref 0.44–1.00)
GFR, Estimated: >60 mL/min (ref >60)
Glucose, Bld: 263 mg/dL — ABNORMAL HIGH (ref 70–99)
Potassium: 3.4 mmol/L — ABNORMAL LOW (ref 3.5–5.1)
Sodium: 138 mmol/L (ref 135–145)
Total Bilirubin: 1.1 mg/dL (ref 0.3–1.2)
Total Protein: 4.3 g/dL — ABNORMAL LOW (ref 6.5–8.1)

## 2020-07-06 LAB — GLUCOSE, CAPILLARY
Glucose-Capillary: 202 mg/dL — ABNORMAL HIGH (ref 70–99)
Glucose-Capillary: 247 mg/dL — ABNORMAL HIGH (ref 70–99)
Glucose-Capillary: 275 mg/dL — ABNORMAL HIGH (ref 70–99)
Glucose-Capillary: 360 mg/dL — ABNORMAL HIGH (ref 70–99)
Glucose-Capillary: 227 mg/dL — ABNORMAL HIGH (ref 70–99)
Glucose-Capillary: 173 mg/dL — ABNORMAL HIGH (ref 70–99)

## 2020-07-06 LAB — ACID FAST CULTURE WITH REFLEXED SENSITIVITIES (MYCOBACTERIA): Acid Fast Culture: POSITIVE — AB

## 2020-07-06 LAB — SLOW GROWER BROTH SUSCEP.
Amikacin: 4
Ciprofloxacin: 1
Clarithromycin: 0.5
Linezolid: 2
Streptomycin: 16

## 2020-07-06 LAB — CBC
HCT: 27.2 % — ABNORMAL LOW (ref 36.0–46.0)
Hemoglobin: 9.5 g/dL — ABNORMAL LOW (ref 12.0–15.0)
MCH: 30.4 pg (ref 26.0–34.0)
MCHC: 34.9 g/dL (ref 30.0–36.0)
MCV: 86.9 fL (ref 80.0–100.0)
Platelets: 137 10*3/uL — ABNORMAL LOW (ref 150–400)
RBC: 3.13 MIL/uL — ABNORMAL LOW (ref 3.87–5.11)
RDW: 16.8 % — ABNORMAL HIGH (ref 11.5–15.5)
WBC: 11.6 10*3/uL — ABNORMAL HIGH (ref 4.0–10.5)
nRBC: 0 % (ref 0.0–0.2)

## 2020-07-06 LAB — ACID FAST SMEAR (AFB, MYCOBACTERIA)
Acid Fast Smear: NEGATIVE
Acid Fast Smear: NEGATIVE

## 2020-07-06 LAB — AFB ORGANISM ID BY DNA PROBE
M avium complex: NEGATIVE
M tuberculosis complex: NEGATIVE

## 2020-07-06 LAB — TRIGLYCERIDES: Triglycerides: 121 mg/dL (ref <150)

## 2020-07-06 LAB — PATHOLOGIST SMEAR REVIEW

## 2020-07-06 LAB — FUNGUS CULTURE WITH STAIN

## 2020-07-06 LAB — ORG ID BY SEQUENCING RFLX AST

## 2020-07-06 LAB — FUNGAL ORGANISM REFLEX

## 2020-07-06 LAB — FUNGUS CULTURE RESULT

## 2020-07-06 MED ORDER — DOCUSATE SODIUM 50 MG/5ML PO LIQD
100.0000 mg | Freq: Two times a day (BID) | ORAL | Status: DC
  Administered 2020-07-06 – 2020-07-17 (×8): 100 mg via ORAL
  Filled 2020-07-06 (×14): qty 10

## 2020-07-06 MED ORDER — ALBUMIN HUMAN 25 % IV SOLN
25.0000 g | Freq: Once | INTRAVENOUS | Status: AC
  Administered 2020-07-06: 25 g via INTRAVENOUS
  Filled 2020-07-06: qty 100

## 2020-07-06 MED ORDER — ATORVASTATIN CALCIUM 40 MG PO TABS
40.0000 mg | ORAL_TABLET | Freq: Every day | ORAL | Status: DC
  Administered 2020-07-06 – 2020-07-17 (×12): 40 mg via ORAL
  Filled 2020-07-06 (×12): qty 1

## 2020-07-06 MED ORDER — RIFAMPIN ORAL SUSPENSION 25 MG/ML
600.0000 mg | Freq: Every day | ORAL | Status: DC
Start: 2020-07-06 — End: 2020-07-18
  Administered 2020-07-06 – 2020-07-17 (×12): 600 mg via ORAL
  Filled 2020-07-06 (×12): qty 24

## 2020-07-06 MED ORDER — PANTOPRAZOLE SODIUM 40 MG PO PACK
40.0000 mg | PACK | Freq: Every day | ORAL | Status: DC
  Administered 2020-07-06 – 2020-07-17 (×11): 40 mg via ORAL
  Filled 2020-07-06 (×13): qty 20

## 2020-07-06 MED ORDER — VITAMIN B-6 50 MG PO TABS
50.0000 mg | ORAL_TABLET | Freq: Every day | ORAL | Status: DC
  Administered 2020-07-06 – 2020-07-17 (×12): 50 mg via ORAL
  Filled 2020-07-06 (×12): qty 1

## 2020-07-06 MED ORDER — ADULT MULTIVITAMIN LIQUID CH
15.0000 mL | Freq: Every day | ORAL | Status: DC
  Administered 2020-07-06: 15 mL via ORAL
  Filled 2020-07-06: qty 15

## 2020-07-06 MED ORDER — OLANZAPINE 2.5 MG PO TABS
2.5000 mg | ORAL_TABLET | Freq: Every day | ORAL | Status: DC
  Administered 2020-07-06 – 2020-07-16 (×11): 2.5 mg via ORAL
  Filled 2020-07-06 (×13): qty 1

## 2020-07-06 MED ORDER — ACETAMINOPHEN 160 MG/5ML PO SOLN
650.0000 mg | Freq: Four times a day (QID) | ORAL | Status: DC
  Administered 2020-07-06 – 2020-07-13 (×27): 650 mg via ORAL
  Filled 2020-07-06 (×26): qty 20.3

## 2020-07-06 MED ORDER — ASPIRIN 81 MG PO CHEW
81.0000 mg | CHEWABLE_TABLET | Freq: Every day | ORAL | Status: DC
  Administered 2020-07-06 – 2020-07-17 (×12): 81 mg via ORAL
  Filled 2020-07-06 (×12): qty 1

## 2020-07-06 MED ORDER — PROSOURCE TF PO LIQD
45.0000 mL | Freq: Three times a day (TID) | ORAL | Status: DC
  Administered 2020-07-06 – 2020-07-16 (×30): 45 mL
  Filled 2020-07-06 (×32): qty 45

## 2020-07-06 MED ORDER — POTASSIUM CHLORIDE CRYS ER 20 MEQ PO TBCR
60.0000 meq | EXTENDED_RELEASE_TABLET | Freq: Once | ORAL | Status: AC
  Administered 2020-07-06: 60 meq via ORAL
  Filled 2020-07-06: qty 3

## 2020-07-06 MED ORDER — OXYCODONE HCL 5 MG PO TABS
5.0000 mg | ORAL_TABLET | ORAL | Status: DC | PRN
  Administered 2020-07-06 – 2020-07-16 (×21): 5 mg via ORAL
  Filled 2020-07-06 (×22): qty 1

## 2020-07-06 MED ORDER — FUROSEMIDE 10 MG/ML IJ SOLN
40.0000 mg | Freq: Four times a day (QID) | INTRAMUSCULAR | Status: AC
Start: 2020-07-06 — End: 2020-07-06
  Administered 2020-07-06 (×2): 40 mg via INTRAVENOUS
  Filled 2020-07-06 (×2): qty 4

## 2020-07-06 MED ORDER — GUAIFENESIN-DM 100-10 MG/5ML PO SYRP: 15.0000 mL | ORAL_SOLUTION | ORAL | Status: DC | PRN

## 2020-07-06 MED ORDER — POLYETHYLENE GLYCOL 3350 17 G PO PACK
17.0000 g | PACK | Freq: Every day | ORAL | Status: DC
  Administered 2020-07-06 – 2020-07-08 (×3): 17 g via ORAL
  Filled 2020-07-06 (×6): qty 1

## 2020-07-06 MED ORDER — ETHAMBUTOL HCL 400 MG PO TABS
1200.0000 mg | ORAL_TABLET | Freq: Every day | ORAL | Status: DC
Start: 2020-07-06 — End: 2020-07-18
  Administered 2020-07-06 – 2020-07-17 (×12): 1200 mg via ORAL
  Filled 2020-07-06 (×12): qty 3

## 2020-07-06 MED ORDER — GLUCERNA SHAKE PO LIQD
237.0000 mL | Freq: Three times a day (TID) | ORAL | Status: DC
  Administered 2020-07-06 – 2020-07-17 (×26): 237 mL via ORAL

## 2020-07-06 MED ORDER — ISONIAZID 300 MG PO TABS
300.0000 mg | ORAL_TABLET | Freq: Every day | ORAL | Status: DC
Start: 2020-07-06 — End: 2020-07-18
  Administered 2020-07-06 – 2020-07-17 (×12): 300 mg via ORAL
  Filled 2020-07-06 (×12): qty 1

## 2020-07-06 NOTE — Progress Notes (Signed)
[facility] for Infectious Disease    Date of Admission:  06/14/2020   Total days of antibiotics         Day 7 of RIE           ID: Holly Stevenson is a 70 y.o. 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 She also had chest tube place to ease ventialtion. Chest tube serous fluid with 1312m. Repeat CXR this morning shows left sided effusion per my read. Clear base since pigtail catheter placed on the right. Patient is the most alert I have seen her since Monday, denies any fever, chills, mild shortness of breath  Medications:  . sodium chloride   Intravenous Once  . sodium chloride   Intravenous Once  . acetaminophen (TYLENOL) oral liquid 160 mg/5 mL  650 mg Oral Q6H  . aspirin  81 mg Oral Daily  . atorvastatin  40 mg Oral Daily  . chlorhexidine gluconate (MEDLINE KIT)  15 mL Mouth Rinse BID  . Chlorhexidine Gluconate Cloth  6 each Topical Daily  . cholecalciferol  1,000 Units Per Tube Daily  . docusate  100 mg Oral BID  . enoxaparin (LOVENOX) injection  40 mg Subcutaneous Q24H  . ethambutol  1,200 mg Oral Daily  . furosemide  40 mg Intravenous Q6H  . insulin aspart  0-9 Units Subcutaneous Q4H  . insulin glargine  10 Units Subcutaneous BID  . isoniazid  300 mg Oral Daily  . multivitamin  15 mL Oral Daily  . OLANZapine  2.5 mg Oral QHS  . pantoprazole sodium  40 mg Oral Daily  . polyethylene glycol  17 g Oral Daily  . vitamin B-6  50 mg Oral Daily  . rifampin  600 mg Oral Daily    Objective: Vital signs in last 24 hours: Temp:  [97 F (36.1 C)-99.7 F (37.6 C)] 99.2 F (37.3 C) (01/20 0645) Pulse Rate:  [53-81] 69 (01/20 1030) Resp:  [0-37] 18 (01/20 1030) BP: (114-172)/(42-53) 143/45 (01/20  1030) SpO2:  [94 %-100 %] 95 % (01/20 1030) Arterial Line BP: (82-202)/(35-120) 113/55 (01/20 0900) FiO2 (%):  [30 %-40 %] 30 % (01/20 0852)  Physical Exam  Constitutional:  oriented to person, place, and time. appears well-developed and well-nourished. No distress.  HENT: [place]/AT, PERRLA, no scleral icterus Mouth/Throat: Oropharynx is clear and moist. No oropharyngeal exudate.  Cardiovascular: Normal rate, regular rhythm and normal heart sounds. Exam reveals no gallop and no friction rub.  No murmur heard.  Pulmonary/Chest: Effort normal and breath sounds normal. No respiratory distress.  has no wheezes.right sided chest tube  Neck = supple, no nuchal rigidity Abdominal: Soft. Bowel sounds are normal.  exhibits no distension. There is no tenderness.  Ext: left aka, and right leg has staples in place that are c/d/i Lymphadenopathy: no cervical adenopathy. No axillary adenopathy Neurological: alert and oriented to person, place, and time.  Skin: Skin is warm and dry. No rash noted. No erythema.  Psychiatric: a normal mood and affect.  behavior is normal.    Lab Results Recent Labs    07/05/20 1538 07/06/20 0430  WBC 18.3* 11.6*  HGB 11.2* 9.5*  HCT 33.2* 27.2*  NA 138 138  K 4.1 3.4*  CL 102 98  CO2 28 31  BUN 29* 27*  CREATININE 0.53 0.65   Liver Panel Recent Labs    07/05/20 1538 07/06/20 0430  PROT 4.6* 4.3*  ALBUMIN 1.5* 1.3*  AST 119* 137*  ALT 67* 71*  ALKPHOS 71 77  BILITOT 1.4* 1.1    Microbiology: reviewed Studies/Results: Portable Chest xray  Result Date: 07/06/2020 CLINICAL DATA:  Acute respiratory failure with hypoxia. EXAM: PORTABLE CHEST 1 VIEW COMPARISON:  July 05, 2020. FINDINGS: Stable cardiomediastinal silhouette. Feeding tube is seen entering stomach. Endotracheal tube appears to have been removed. Left internal jugular catheter is noted and unchanged. Right-sided chest tube is noted without pneumothorax. Stable left lung opacity is noted with  probable left basilar effusion. Stable right upper lobe opacity is noted concerning for pneumonia. Bony thorax is unremarkable. IMPRESSION: Endotracheal tube appears to have been removed. Otherwise stable support apparatus. Stable bilateral lung opacities are noted concerning for pneumonia. Probable left basilar effusion is noted as well. Electronically Signed   By: Marijo Conception M.D.   On: 07/06/2020 08:01   DG Chest Port 1 View  Result Date: 07/05/2020 CLINICAL DATA:  New chest tube placement. EXAM: PORTABLE CHEST 1 VIEW COMPARISON:  07/05/2020 and CT chest 06/27/2020. FINDINGS: Feeding tube is followed into the stomach with the tip projecting beyond the inferior margin of the image. Left IJ central line tip is stable in the proximal SVC. New small bore pigtail pleural catheter terminates in the medial aspect of the mid right hemithorax. Rounded opacity in the right upper lobe corresponds to a cavitary lesion seen on 06/27/2020. Mild interstitial prominence and indistinctness. Near complete resolution of right pleural effusion. Small left pleural effusion. No pneumothorax. IMPRESSION: 1. Interval placement of a right chest tube with decrease in right pleural fluid. 2. Nodular opacity in the right upper lobe corresponds to a cavitary lesion better seen and described on CT chest 06/27/2020. 3. Mild diffuse interstitial prominence and indistinctness, possibly due to pneumonia, when compared with 06/27/2020. 4. Small left pleural effusion. Electronically Signed   By: Lorin Picket M.D.   On: 07/05/2020 14:35   DG CHEST PORT 1 VIEW  Result Date: 07/05/2020 CLINICAL DATA:  Central catheter placement.  Intubation. EXAM: PORTABLE CHEST 1 VIEW COMPARISON:  July 05, 2020 study obtained earlier in the day FINDINGS: Endotracheal tube tip is 6.2 cm above the carina. Central catheter tip is in the superior vena cava slightly beyond the junction with the left innominate vein. Feeding tube tip is below the  diaphragm. No pneumothorax. Diffuse airspace opacity throughout the lungs bilaterally is stable compared to earlier in the day. No new opacity evident. Heart size and pulmonary vascularity are normal. No adenopathy. There is aortic atherosclerosis. No bone lesions. IMPRESSION: Tube and catheter positions as described without pneumothorax. Stable multifocal airspace opacity bilaterally which could be indicative of either pulmonary edema or multifocal pneumonia. Both entities may be present concurrently. ARDS may also be superimposed. Stable cardiac silhouette. Aortic Atherosclerosis (ICD10-I70.0). Electronically Signed   By: Lowella Grip III M.D.   On: 07/05/2020 10:53   DG Chest Port 1 View  Result Date: 07/05/2020 CLINICAL DATA:  Respiratory failure. EXAM: PORTABLE CHEST 1 VIEW COMPARISON:  07/03/2020. FINDINGS: Feeding tube noted with tip below left hemidiaphragm. Heart size stable. Diffuse bilateral pulmonary infiltrates/edema again noted. No significant interim change. Small bilateral pleural effusions again noted. No pneumothorax. IMPRESSION: 1.  Feeding tube again noted with tip below left hemidiaphragm. 2. Diffuse bilateral pulmonary infiltrates/edema  again noted. No significant interim change. Small bilateral pleural effusions again noted. Electronically Signed   By: Marcello Moores  Register   On: 07/05/2020 05:45     Assessment/Plan: Respiratory failure with hypoxia = improved, therapeutic right sided pigtail drainage was very effective. 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 Continue with vitamin b6. Will need weekly CMP on follow up. We will have follow up in the ID clinic. Will need eye exam after discharge.  AMS = appears improved since CNS sedating meds have cleared her symptoms   Hx of right vascular graft infection = recent OR not does not suggest ongoing infection.  Continue to monitor for healing. If signs of infection, please send out for cultures   [facility]ious [facility] Cell: [phone] Pager: [phone]  07/06/2020, 11:41 AM

## 2020-07-06 NOTE — Progress Notes (Signed)
Inpatient Diabetes Program Recommendations  AACE/ADA: New Consensus Statement on Inpatient Glycemic Control (2015)  Target Ranges:  Prepandial:   less than 140 mg/dL      Peak postprandial:   less than 180 mg/dL (1-2 hours)      Critically ill patients:  140 - 180 mg/dL   Lab Results  Component Value Date   GLUCAP 275 (H) 07/06/2020   HGBA1C 7.2 (H) 05/11/2020    Review of Glycemic Control Results for Holly Stevenson, Holly Stevenson (MRN [id number]) as of 07/06/2020 11:21  Ref. Range 07/06/2020 01:03 07/06/2020 03:32 07/06/2020 06:48  Glucose-Capillary Latest Ref Range: 70 - 99 mg/dL 027 (H) 741 (H) 287 (H)   Diabetes history: Type 2 Dm Outpatient Diabetes medications: Metformin 1000 mg BID, Jardiance 25 mg QD Current orders for Inpatient glycemic control: Novolog 0-9 units Q4H, Lantus 10 units BID Osmolite @ 55 ml/hr  Inpatient Diabetes Program Recommendations:   Noted transition off IV insulin without basal, hence glucose trends elevated. Consider adding Novolog 3 units Q4H for tube feed coverage (to be stopped or held in the event tube feeds are stopped).   Thanks, Lujean Rave, MSN, RNC-OB Diabetes Coordinator [phone] (8a-5p)

## 2020-07-06 NOTE — Progress Notes (Addendum)
Nutrition Follow Up  DOCUMENTATION CODES:   Not applicable  INTERVENTION:   Tube feeding:  -Osmolite 1.5 @ 55 ml/hr via Cortrak (1320 ml) -ProSource TF 45 ml TID  Provides: 2100 kcals, 116 grams protein, 1006 ml free water. Meets 100% of needs.    Glucerna Shake po TID, each supplement provides 220 kcal and 10 grams of protein  May wean tube feeding as PO intake progresses.   NUTRITION DIAGNOSIS:   Increased nutrient needs related to post-op healing as evidenced by estimated needs.  Ongoing  GOAL:   Patient will meet greater than or equal to 90% of their needs   Addressed via TF  MONITOR:   PO intake,Supplement acceptance,Weight trends,Labs,I & O's  REASON FOR ASSESSMENT:   Consult Assessment of nutrition requirement/status  ASSESSMENT:   Patient with PMH significant for DM, HTN, PVD s/p R groin exploration, repair of pseudoaneurysm with interposition grafting, thrombectomy of fem-fem bypass, thrombectomy of L fem-pop bypass. Presents this admission with R groin dehiscence.   12/29- s/p excisional debridement to R groin 12/30- s/p excision of R to L fem-fem bypass, reopening of L groin, stenting 12/31- s/p L common femoral and external iliac endarterectomy, L popliteal artery endarterectomy, stenting  1/02- L AKA, bilateral groin VAC change 01/10- gastric Cortrak placed, nocturnal tube feeding started 01/19- exploration of R groin for bleeding, R chest tube, extubated  01/20- thoracentesis (800 ml drained), R chest tube  Tolerating Osmolite 1.5 @ 55 ml/hr. ProSource d/c, RD to reorder. Pt complaining of fullness and unable to each much during the day. Consider transition to nocturnal feedings to allow time to eat during the day.   Prealbuminis stronglyaffected by stress response and inflammatory process, therefore, do not expect to see much improvement in this lab value during acute hospitalization.  Admission weight: 87.9 kg  Current weight: 81.1 kg    UOP: 1750 ml x 24 hrs  Wound VAC (L/R groin): 75 ml x 24 hrs   Medications: colace, SS novolog, lantus, miralax, vit B6 Labs: CBG 127-215  Diet Order            Diet regular Room service appropriate? Yes; Fluid consistency: Thin  Diet effective now                 EDUCATION NEEDS:   Education needs have been addressed  Skin:  Skin Assessment: Skin Integrity Issues: Skin Integrity Issues:: Incisions,Wound VAC,Stage I,DTI DTI: buttocks Stage II: sacrum Wound Vac: L/R groin Incisions: L leg, R ankle, groin x2  Last BM:  1/25  Height:   Ht Readings from Last 1 Encounters:  07/05/20 5\' 8"  (1.727 m)    Weight:   Wt Readings from Last 1 Encounters:  07/07/20 81.1 kg    BMI:  Body mass index is 27.19 kg/m.  Estimated Nutritional Needs:   Kcal:  2000-2200 kcal  Protein:  100-120 grams  Fluid:  >/= 2 L/day  07/09/20 RD, LDN Clinical Nutrition Pager listed in AMION

## 2020-07-06 NOTE — Progress Notes (Signed)
Tolerated thora well. - Remove chest tube, mobility as able, IS  PCCM available PRN.  Myrla Halsted MD PCCM

## 2020-07-06 NOTE — Procedures (Signed)
Thoracentesis  Procedure Note  Kinza Gouveia  [id number]  1951-03-03  Date:07/06/20  Time:2:31 PM   Provider Performing:Thurma Priego Veleta Miners   Procedure: Thoracentesis with imaging guidance (40981)  Indication(s) Pleural Effusion  Consent Risks of the procedure as well as the alternatives and risks of each were explained to the patient and/or caregiver.  Consent for the procedure was obtained and is signed in the bedside chart  Anesthesia Topical only with 1% lidocaine    Time Out Verified patient identification, verified procedure, site/side was marked, verified correct patient position, special equipment/implants available, medications/allergies/relevant history reviewed, required imaging and test results available.   Sterile Technique Maximal sterile technique including full sterile barrier drape, hand hygiene, sterile gown, sterile gloves, mask, hair covering, sterile ultrasound probe cover (if used).  Procedure Description Ultrasound was used to identify appropriate pleural anatomy for placement and overlying skin marked.  Area of drainage cleaned and draped in sterile fashion. Lidocaine was used to anesthetize the skin and subcutaneous tissue.  800 cc's of clear yellow appearing fluid was drained from the left pleural space. Catheter then removed and bandaid applied to site.   Complications/Tolerance None; patient tolerated the procedure well. Chest X-ray is ordered to confirm no post-procedural complication.   EBL Minimal   Specimen(s) None.     Procedure performed under direct supervision of Dr. Katrinka Blazing and with ultrasound guidance.   Canary Brim, MSN, NP-C, AGACNP-BC [facility] 07/06/2020, 2:32 PM   Please see [email or web address] for pager details.

## 2020-07-06 NOTE — Progress Notes (Signed)
NAME:  Holly Stevenson, MRN:  [id number], DOB:  1950-12-28, LOS: 22 ADMISSION DATE:  06/14/2020, CONSULTATION DATE:  06/16/20 REFERRING MD:  Randie Heinz  CHIEF COMPLAINT:  Vent Management   Brief History   70 y/o admitted 12/29 for R groin graft debridement with concerns for infection / dehiscence.  She has had multiple vascular surgeries, most recently L common femoral and external iliac endarterectomy with vein patch angioplasty, bilateral common iliac artery stenting, stenting of left external iliac artery, L popliteal artery endarterectomy, L common femoral to above knee popliteal bypass, L lower extremity thromboembolectomy on 12/31.  She was left intubated post op and PCCM asked to assist with vent management. 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    Of note, she had navigational bronchoscopy of RUL mass 12/21 with Dr. Delton Coombes.  Path was negative for malignancy but showed granulomas of unclear etiology.  Culture positive for mycobacterium kansasii.    Past Medical History  has Diabetic infection of left foot (HCC); DM2 (diabetes mellitus, type 2) (HCC); CKD (chronic kidney disease) stage 3, GFR 30-59 ml/min (HCC); HTN (hypertension); Cavitary lesion of lung; Cellulitis of toe of left foot; Idiopathic chronic gout of left knee without tophus; Gangrene of toe of left foot (HCC); PAD (peripheral artery disease) (HCC); Critical lower limb ischemia (HCC); COPD (chronic obstructive pulmonary disease) (HCC); Diabetic renal disease (HCC); Hyperlipidemia; Personal history of colonic polyps; Polyneuropathy; Tobacco user; Infected prosthetic vascular graft (HCC); Status post surgery; Respiratory failure (HCC); and Pressure injury of skin on their problem list.  Significant Hospital Events   12/16 Exploration of right groin and control of hemorrhage, thrombectomy right to left femoral bypass graft, interposition grafting right common femoral artery to femoral femoral  bypass, thrombectomy left femoral popliteal bypass graft, angiogram LLE. 12/21 EBUS of RUL > granulomas. 12/29 Sharp excisional debridement right groin wound, pulsavac irrigation right groin wound, interposition graft of femorofemoral bypass. 12/30 Excision of right to left femorofemoral bypass, right common femoral bovine pericardial patch and left femoral to popliteal artery bypass graft with reopening of left groin and left above knee popliteal exposure, harvest of right greater saphenous vein, patch angioplasty of right common femoral artery, left common femoral endarterectomy and saphenous vein angioplasty, left popliteal artery vein angioplasty, aortogram, stent of left common and external iliac veins, stent of L SFA, sartorius muscle flap right groin wound. 12/31 L common femoral and external iliac endarterectomy with vein patch angioplasty, harvest L greater saphenous vein, aortogram with bilateral iliofemoral runoff,  bilateral common iliac artery stenting, stenting of left external iliac artery, L popliteal artery endarterectomy, L common femoral to above knee popliteal bypass, L lower extremity thromboembolectomy 1/02 L AKA for gangrene 1/19 Exploration of R groin for bleeding, R pigtail, extubated  Consults:     Procedures:  ETT 1/19 >>  L IJ TLC 1/19 >>  L Radial Aline 1/19 >>   Significant Diagnostic Tests:   CTA Chest 1/11 >> negative for PE, development of multifocal GGO, new bilateral pleural effusion, cavitary RUL processs, CAD  Micro Data:  COVID 12/29 > neg R Groin Wound Surgical Culture 12/29 >> enterococcus faecalis, rare e-coli  BCx2 1/20 >>   Antimicrobials:  Ampicillin 1/3 >> 1/11  Cefepime 1/17 >>   Interim history/subjective:  Looking, feeling well  Objective:  Blood pressure (!) 147/50, pulse 79, temperature 99.2 F (37.3 C), temperature source Oral, resp. rate 19, height 5\' 8"  (1.727 m), weight 99.3 kg, SpO2 96 %.  Intake/Output Summary (Last 24  hours) at 07/06/2020 0857 Last data filed at 07/06/2020 0700 Gross per 24 hour  Intake 2755.05 ml  Output 4515 ml  Net -1759.95 ml   Filed Weights   06/17/20 0339 06/21/20 1432 07/01/20 0409  Weight: 87.9 kg 87.9 kg 99.3 kg    Examination: Constitutional: chronically ill woman in NAD  Eyes: EOMI, pupils equal Ears, nose, mouth, and throat: MMM, trachea midline Cardiovascular: RRR, upper ext warm Respiratory: Diminished bases, no accessory muscle use Gastrointestinal: Soft, hypoactive BS Skin: No rashes, normal turgor Neurologic: no focal deficits Psychiatric: RASS 0, fair insight L AKA staples in place R groin dressing in place  Assessment & Plan:   Pre- and post-operative hypoxemic respiratory failure improved ABLA from R femoral graft leak s/p OR 1/19  Infected R femoral graft Severe PVD, DM  COPD  Smoker  M kansasai pulmonary infection  Bilateral large effusions, transudative  - Remove R pigtail - Therapeutic L thora, if fluid looks clear no reason to send - Nebs to PRN - Post op wound care, abx, CBC monitoring, PT per primary/ID - If tolerates L thora well will sign off, please call if further questions or concerns  Myrla Halsted MD PCCM

## 2020-07-06 NOTE — Progress Notes (Signed)
Physical Therapy Treatment Patient Details Name: Holly Stevenson MRN: [id number] DOB: [birth year] Today's Date: 07/06/2020    History of Present Illness 70 yo admitted 12/29 with right groin dehiscence s/p debridement and graft of fem fem BPG. Additional surgeries 12/30 and 12/31 for endarterectomy and thrombolectomy with resultant LLE BPG occlusion and Rt abdominal hematoma.Bil groin VACs.  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 PMHx: HTN, DM, CKD, gout, PAD, COPD, HLD, polyneuropathy, recent bronchoscopy, 12/16-12/22 multiple vascular surgeries. requires emergent return to OR to Repair of right common femoral artery patch due to uncontrolled bleeding. 07/05/20    PT Comments    Pt immediately status post thoracentesis and RN request bed level exercise. Focus of session education on phantom leg pain, and proper positioning of R LE for functional mobility after hospitalization. Pt able to perform limited exercise before X-ray arrived and session ended. D/c plans remain appropriate. PT will continue to follow acutely.     Follow Up Recommendations  CIR;Supervision/Assistance - 24 hour     Equipment Recommendations  Wheelchair (measurements PT);Wheelchair cushion (measurements PT);3in1 (PT);Hospital bed       Precautions / Restrictions Precautions Precautions: Fall;Other (comment) Precaution Comments: bil groin VAC, Cortrak, monitor O2 Restrictions Weight Bearing Restrictions: Yes LLE Weight Bearing: Non weight bearing    Mobility  Bed Mobility               General bed mobility comments: mobility deferred due to recent thoracentisis RN request only bed level exercise                        Cognition Arousal/Alertness: Awake/alert Behavior During Therapy: WFL for tasks assessed/performed Overall Cognitive Status: Within Functional Limits for tasks assessed                                        Exercises General Exercises - Lower Extremity Ankle  Circles/Pumps: AROM;Right;15 reps Quad Sets: AROM;Right;10 reps Gluteal Sets: AROM;Both;10 reps Heel Slides: AROM;Right;10 reps Hip ABduction/ADduction: AROM;Right;10 reps;Supine Straight Leg Raises: AROM;Both;10 reps;Supine    General Comments General comments (skin integrity, edema, etc.): Pt educated on phantom pain and LE positioning for improved mobility      Pertinent Vitals/Pain Pain Assessment: Faces Faces Pain Scale: Hurts little more Pain Location: L residual limb Pain Descriptors / Indicators: Sore;Operative site guarding;Grimacing;Guarding Pain Intervention(s): Limited activity within patient's tolerance;Monitored during session;Repositioned           PT Goals (current goals can now be found in the care plan section) Acute Rehab PT Goals Patient Stated Goal: get better and go home PT Goal Formulation: With patient/family Time For Goal Achievement: 07/13/20 Potential to Achieve Goals: Fair    Frequency    Min 3X/week      PT Plan Current plan remains appropriate    Co-evaluation PT/OT/SLP Co-Evaluation/Treatment: Yes            AM-PAC PT "6 Clicks" Mobility   Outcome Measure  Help needed turning from your back to your side while in a flat bed without using bedrails?: A Lot Help needed moving from lying on your back to sitting on the side of a flat bed without using bedrails?: A Lot Help needed moving to and from a bed to a chair (including a wheelchair)?: A Lot Help needed standing up from a chair using your arms (e.g., wheelchair or bedside chair)?: A Lot Help  needed to walk in hospital room?: A Lot Help needed climbing 3-5 steps with a railing? : Total 6 Click Score: 11    End of Session Equipment Utilized During Treatment: Oxygen Activity Tolerance: Patient limited by fatigue Patient left: in bed;with call bell/phone within reach;with bed alarm set Nurse Communication: Mobility status;Need for lift equipment PT Visit Diagnosis: Unsteadiness  on feet (R26.81);Other abnormalities of gait and mobility (R26.89);Muscle weakness (generalized) (M62.81);Difficulty in walking, not elsewhere classified (R26.2);Pain Pain - Right/Left: Left Pain - part of body: Leg     Time: 8182-9937 PT Time Calculation (min) (ACUTE ONLY): 10 min  Charges:  $Therapeutic Exercise: 8-22 mins                     Malee Grays B. Beverely Risen PT, DPT Acute Rehabilitation Services Pager [phone] Office [phone]    Elon Alas [facility] 07/06/2020, 4:17 PM

## 2020-07-06 NOTE — Progress Notes (Signed)
  Speech Language Pathology Treatment: Dysphagia  Patient Details Name: Holly Stevenson MRN: [id number] DOB: Feb 28, 1951 Today's Date: 07/06/2020 Time: 7001-7494 SLP Time Calculation (min) (ACUTE ONLY): 8 min  Assessment / Plan / Recommendation Clinical Impression  Pt consumed thin liquids and solids with no overt s/s of respiratory dysphagia or aspiration. Pt reports that she feels she is swallowing better. She was intubated and extubated on 07/05/2020 for a procedure and chest tube was placed to remove fluid from lungs. She feels like her respiratory status is improved and is no longer short of breath. No f/u required at this time.    HPI HPI: Pt is a 70 y.o. female presenting with an infected prosthetic graft and a history of HTN, DM, CKD, gout, PAD, COPD, HLD, polyneuropathy, recent bronchoscopy, 12/16-12/22 multiple vascular surgeries.  Additional surgeries 12/30 and 12/31 for endarterectomy and thrombolectomy with resultant LLE BPG occlusion and Rt abdominal hematoma. Recent CXR revealed diffuse alveolar airspace disease throughout both lungs. Was on BiPAP 07/03/2020.      SLP Plan  All goals met       Recommendations  Diet recommendations: Regular;Thin liquid Liquids provided via: Straw Medication Administration: Whole meds with liquid Supervision: Patient able to self feed Compensations: Small sips/bites Postural Changes and/or Swallow Maneuvers: Seated upright 90 degrees                Oral Care Recommendations: Oral care BID Follow up Recommendations: Inpatient Rehab SLP Visit Diagnosis: Dysphagia, unspecified (R13.10) Plan: All goals met       GO               Jeanine Luz., SLP Student 07/06/2020, 11:49 AM

## 2020-07-06 NOTE — Telephone Encounter (Signed)
Called and spoke to pt's daughter, Efraim Kaufmann. She is questioning what would have been discussed at upcoming visit tomorrow. Advised her the appt is now irrelevant considering pts health has changed and we will now wait until d/c to schedule her a HFU and evaluate at that time. Melissa verbalized understanding. Advised Melissa that pulmonary is on pts team as inpatient and if anything is needed we will assure it gets done. Melissa verbalized understanding and denied any further questions or concerns at this time.

## 2020-07-06 NOTE — Progress Notes (Addendum)
[facility] [place]  Subjective  - Resting well   Objective (!) 161/53 68 99.2 F (37.3 C) (Oral) 20 96%  Intake/Output Summary (Last 24 hours) at 07/06/2020 5573 Last data filed at 07/06/2020 0700 Gross per 24 hour  Intake 4865.05 ml  Output 4815 ml  Net 50.05 ml    Vacs with good seal, OP 100 ml Chest tube 1,240 ml right Non labored breathing Right LE with AT signal   Assessment/Planning: 70 y.o. 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 Newly dx pulmonary m.[place]ii, with respiratory distress POD # 1 Repair right femoral artery patch angioplasty  Extubated 07/05/20 now on [place] O2 B pleural effusions chest tube on right side with good output Possible plan to d/c right chest tube and place left chest tube On IV antibiotics Rifampin and Cefepime Infectious disease following Urine OP stable >3000 ml HGB 9.5 stable after 2 units PRBC for blood loss anemia    Holly Stevenson 07/06/2020 7:23 AM --  Laboratory Lab Results: Recent Labs    07/05/20 1538 07/06/20 0430  WBC 18.3* 11.6*  HGB 11.2* 9.5*  HCT 33.2* 27.2*  PLT 185 137*   BMET Recent Labs    07/05/20 1538 07/06/20 0430  NA 138 138  K 4.1 3.4*  CL 102 98  CO2 28 31  GLUCOSE 103* 263*  BUN 29* 27*  CREATININE 0.53 0.65  CALCIUM 6.9* 7.0*    COAG Lab Results  Component Value Date   INR 1.0 05/03/2020   INR 1.0 04/25/2020   No results found for: PTT  I have seen and evaluated the patient. I agree with the PA note as documented above.  70 year old female with complex hospital course now postop day 1 status post emergent repair of right groin bleed with multiple pledgeted repairs in the vein patch.  She was sent to the ICU intubated.  This morning she is extubated in the ICU.  Her right groin has had no additional bleeding overnight.  Both groin vacs have excellent seal.  Right foot is motor and sensory intact and warm.  Hemoglobin today is 9.5.  Cr  0.65.  She did get a right chest tube for pleural effusion.  Appreciate critical care.  Cephus Shelling, MD [facility] [place] Office: [phone]  Cephus Shelling

## 2020-07-07 ENCOUNTER — Ambulatory Visit: Payer: BC Managed Care – PPO | Admitting: Emergency Medicine

## 2020-07-07 LAB — CBC
HCT: 26.6 % — ABNORMAL LOW (ref 36.0–46.0)
Hemoglobin: 8.6 g/dL — ABNORMAL LOW (ref 12.0–15.0)
MCH: 29.5 pg (ref 26.0–34.0)
MCHC: 32.3 g/dL (ref 30.0–36.0)
MCV: 91.1 fL (ref 80.0–100.0)
Platelets: 128 10*3/uL — ABNORMAL LOW (ref 150–400)
RBC: 2.92 MIL/uL — ABNORMAL LOW (ref 3.87–5.11)
RDW: 16.5 % — ABNORMAL HIGH (ref 11.5–15.5)
WBC: 9.1 10*3/uL (ref 4.0–10.5)
nRBC: 0 % (ref 0.0–0.2)

## 2020-07-07 LAB — GLUCOSE, CAPILLARY
Glucose-Capillary: 169 mg/dL — ABNORMAL HIGH (ref 70–99)
Glucose-Capillary: 196 mg/dL — ABNORMAL HIGH (ref 70–99)
Glucose-Capillary: 201 mg/dL — ABNORMAL HIGH (ref 70–99)
Glucose-Capillary: 202 mg/dL — ABNORMAL HIGH (ref 70–99)
Glucose-Capillary: 249 mg/dL — ABNORMAL HIGH (ref 70–99)
Glucose-Capillary: 252 mg/dL — ABNORMAL HIGH (ref 70–99)

## 2020-07-07 LAB — BASIC METABOLIC PANEL
Anion gap: 8 (ref 5–15)
BUN: 29 mg/dL — ABNORMAL HIGH (ref 8–23)
CO2: 32 mmol/L (ref 22–32)
Calcium: 7.2 mg/dL — ABNORMAL LOW (ref 8.9–10.3)
Chloride: 95 mmol/L — ABNORMAL LOW (ref 98–111)
Creatinine, Ser: 0.51 mg/dL (ref 0.44–1.00)
GFR, Estimated: >60 mL/min (ref >60)
Glucose, Bld: 199 mg/dL — ABNORMAL HIGH (ref 70–99)
Potassium: 3.8 mmol/L (ref 3.5–5.1)
Sodium: 135 mmol/L (ref 135–145)

## 2020-07-07 LAB — FUNGUS CULTURE RESULT

## 2020-07-07 LAB — FUNGUS CULTURE WITH STAIN

## 2020-07-07 LAB — FUNGAL ORGANISM REFLEX

## 2020-07-07 MED ORDER — SODIUM CHLORIDE 0.9% FLUSH
10.0000 mL | Freq: Two times a day (BID) | INTRAVENOUS | Status: DC
  Administered 2020-07-07 – 2020-07-10 (×7): 10 mL
  Administered 2020-07-10: 30 mL
  Administered 2020-07-11 – 2020-07-16 (×11): 10 mL

## 2020-07-07 MED ORDER — SODIUM CHLORIDE 0.9% FLUSH: 10.0000 mL | INTRAVENOUS | Status: DC | PRN

## 2020-07-07 MED ORDER — COLLAGENASE 250 UNIT/GM EX OINT
TOPICAL_OINTMENT | Freq: Every day | CUTANEOUS | Status: DC
  Administered 2020-07-13 – 2020-07-16 (×3): 1 via TOPICAL
  Filled 2020-07-07 (×3): qty 30

## 2020-07-07 NOTE — Progress Notes (Signed)
Inpatient Rehab Admissions Coordinator:  Noted pt has only been able to perform bed level exercises with therapy during her most recent sessions.  Will continue to monitor pt's tolerance of therapy to determine appropriateness for CIR.   Wolfgang Phoenix, MS, CCC-SLP Admissions Coordinator [phone]

## 2020-07-07 NOTE — Progress Notes (Signed)
Occupational Therapy Treatment Patient Details Name: Holly Stevenson MRN: [id number] DOB: 09-22-1950 Today's Date: 07/07/2020    History of present illness 70 yo admitted 12/29 with right groin dehiscence s/p debridement and graft of fem fem BPG. Additional surgeries 12/30 and 12/31 for endarterectomy and thrombolectomy with resultant LLE BPG occlusion and Rt abdominal hematoma.Bil groin VACs.  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 PMHx: HTN, DM, CKD, gout, PAD, COPD, HLD, polyneuropathy, recent bronchoscopy, 12/16-12/22 multiple vascular surgeries. requires emergent return to OR to Repair of right common femoral artery patch due to uncontrolled bleeding. 07/05/20   OT comments  Patient seen after transfer to 4E from 2H.  Patient attempted bed mobility and supine to sit, unfortunately she is still having significant discomfort at the site where a recent thoracentesis was performed.  Patient unable to complete sideling to sit.  Patient was returned to supine and assisted higher in the bed. Of note, patient has an unstageable PI to her coccyx area. OT to continue in the acute setting.  CIR has been recommended.     Follow Up Recommendations  CIR;Supervision/Assistance - 24 hour    Equipment Recommendations  3 in 1 bedside commode;Tub/shower bench;Wheelchair (measurements OT);Wheelchair cushion (measurements OT)    Recommendations for Other Services      Precautions / Restrictions Precautions Precaution Comments: bil groin VAC, Cortrak, monitor O2, Unstageable PI to coccyx - lower sacral area. Restrictions LLE Weight Bearing: Non weight bearing       Mobility Bed Mobility Overal bed mobility: Needs Assistance Bed Mobility: Rolling;Sidelying to Sit;Sit to Sidelying Rolling: Min assist Sidelying to sit: Max assist Supine to sit: Max assist     General bed mobility comments: patient stopped mid attempt for sidelying to sit.  Unable to complete due to pain.  Patient returned to  supine.  Transfers                                                                                                             Pertinent Vitals/ Pain       Pain Assessment: Faces Faces Pain Scale: Hurts even more Pain Location: R ribs where Thoracentesis was. Pain Descriptors / Indicators: Grimacing;Guarding;Shooting Pain Intervention(s): Patient requesting pain meds-RN notified                                                          Frequency  Min 2X/week        Progress Toward Goals  OT Goals(current goals can now be found in the care plan section)  Progress towards OT goals: Progressing toward goals  Acute Rehab OT Goals Patient Stated Goal: get better and go home OT Goal Formulation: With patient Time For Goal Achievement: 07/18/20 Potential to Achieve Goals: Good  Plan Discharge plan remains appropriate    Co-evaluation  AM-PAC OT "6 Clicks" Daily Activity     Outcome Measure   Help from another person eating meals?: None Help from another person taking care of personal grooming?: A Little Help from another person toileting, which includes using toliet, bedpan, or urinal?: Total Help from another person bathing (including washing, rinsing, drying)?: A Lot Help from another person to put on and taking off regular upper body clothing?: A Little Help from another person to put on and taking off regular lower body clothing?: A Lot 6 Click Score: 15    End of Session    OT Visit Diagnosis: Muscle weakness (generalized) (M62.81);Pain;Other abnormalities of gait and mobility (R26.89) Pain - Right/Left: Left Pain - part of body: Leg   Activity Tolerance Patient limited by pain   Patient Left in bed;with call bell/phone within reach   Nurse Communication Patient requests pain meds        Time: 4166-0630 OT Time Calculation (min): 20 min  Charges: OT General  Charges $OT Visit: 1 Visit OT Treatments $Self Care/Home Management : 8-22 mins  07/07/2020  Rich, OTR/L  Acute Rehabilitation Services  Office:  [phone]    Suzanna Obey 07/07/2020, 3:46 PM

## 2020-07-07 NOTE — Plan of Care (Signed)

## 2020-07-07 NOTE — Consult Note (Addendum)
WOC Nurse Consult Note: Patient receiving care in [facility] 2H26. Reason for Consult: sacral PI Wound type: Unstageable PI to coccyx - lower sacral area Pressure Injury POA: No Measurement: 5 cm x 3.5 cm Wound bed: yellow, grey, maroon Drainage (amount, consistency, odor) none Periwound: pink, but blanchable  Dressing procedure/placement/frequency: Apply Santyl to coccyx wound in a nickel thick layer. Cover with a saline moistened gauze, then foam dressing.  Change daily. I will also order a standard size bed with air mattress for when the patient is transferred out of ICU.  Additionally, I changed both groin VACs. There is quite a bit of MARSI from bullae formation around the margins of the former drape. Naturally, these ruptured and there was some superficial skin loss when the drapes were removed.  These areas were treated with Crit[employer] clear, and an order for every shift application has been entered.  I was able to avoid most of the areas when I applied the new drapes.  Right groin wound has some necrotic tissue along the superior wound margin. This wound measures 4 cm x 11 cm x 4 cm. The majority of the wound tissue is pink and clean.  Left groin wound is 100% pink and clean. This wound measures 3 cm x 7 cm x 2.5 cm with 1.5 cm of undermining at 5 o'clock.  Each wound was filled with one piece of black foam.  Drape applied, immediate seal obtained.  Patient tolerated very well.  Supplies for Monday's VAC change has been requested.  Helmut Muster, RN, MSN, CWOCN, CNS-BC, pager [phone]

## 2020-07-07 NOTE — Progress Notes (Addendum)
Progress Note    07/07/2020 7:53 AM 2 Days Post-Op  Subjective: no complaints this morning   Vitals:   07/07/20 0600 07/07/20 0700  BP: (!) 145/52 (!) 143/44  Pulse: 70 71  Resp: (!) 26 (!) 24  Temp:  99.6 F (37.6 C)  SpO2: 97% 96%   Physical Exam: Cardiac:  regular Lungs: non labored, on 2L  Incisions: left AKA staple lime with some erythema on the posterior flap extending on latera aspect of AKA and some darkening along the staple line concerning for ischemia.Bilateral groins with VAC to seal.Serousanguinous drainage. 50 cc. No evidence of active bleeding. No hematoma present  Extremities: RLE well perfused and warm with good doppler PT signal/AT. RLE incisions are clean, dry and itnact Abdomen: obese, soft non distended Neurologic: alert and oriented  CBC    Component Value Date/Time   WBC 9.1 07/07/2020 0308   RBC 2.92 (L) 07/07/2020 0308   HGB 8.6 (L) 07/07/2020 0308   HCT 26.6 (L) 07/07/2020 0308   PLT 128 (L) 07/07/2020 0308   MCV 91.1 07/07/2020 0308   MCH 29.5 07/07/2020 0308   MCHC 32.3 07/07/2020 0308   RDW 16.5 (H) 07/07/2020 0308   LYMPHSABS 1.3 04/25/2020 0345   MONOABS 1.1 (H) 04/25/2020 0345   EOSABS 0.1 04/25/2020 0345   BASOSABS 0.0 04/25/2020 0345    BMET    Component Value Date/Time   NA 135 07/07/2020 0308   K 3.8 07/07/2020 0308   CL 95 (L) 07/07/2020 0308   CO2 32 07/07/2020 0308   GLUCOSE 199 (H) 07/07/2020 0308   BUN 29 (H) 07/07/2020 0308   CREATININE 0.51 07/07/2020 0308   CALCIUM 7.2 (L) 07/07/2020 0308   GFRNONAA >60 07/07/2020 0308   GFRAA 52 (L) 02/18/2018 1610    INR    Component Value Date/Time   INR 1.0 05/03/2020 1530     Intake/Output Summary (Last 24 hours) at 07/07/2020 0753 Last data filed at 07/07/2020 0615 Gross per 24 hour  Intake 2632 ml  Output 3540 ml  Net -908 ml     Assessment/Plan:  70 y.o. female  S/p emergent repair of right femoral artery with vein patch 2 Days Post-Op. She is also s/p  Excisionof infected graft materialand muscle flapand subsequent L AKA. Bilateral lower extremities well perfused and warm. Incisions intact. Left AKA will need to closely monitor with new signs of ischemia along staple line. Had L thoracentesis yesterday with 800 cc fluid drained. With Osvaldo Shipper pulmonary infection. On ABX. Appreciate ID and CCM assistance. Continue TF. Good UOP. Hemodynamically stable. Wound VAC change today by wound care nurse. Okay to transfer out of ICU  DVT prophylaxis:  Lovenox   Graceann Congress, [place] [facility] [phone] 07/07/2020 7:53 AM   I have seen and evaluated the patient. I agree with the PA note as documented above. 70 year old female that has had a very complicated hospital course most recently had a right groin bleed from her vein patch on Wednesday morning and required emergent trip to OR. She has had no additional bleeding events since she was taken emergently to the operating room on Wednesday. This morning she remains on 2 L nasal cannula in the ICU. Critical care had placed a pigtail catheter in the right chest and did a thoracentesis of her left chest yesterday. Her breathing seems pretty stable. She is due for VAC changes today in both groins with wound care.  She has a very good PT signal in  the right foot and the right leg looks viable. 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 We will have to watch this. She should be okay for transfer to the floor. Appreciate ID input on antibiotics plan is 12 months due to M. [place]ii pneumonia.    Cephus Shelling, MD [facility] [place] Office: [phone]

## 2020-07-08 LAB — GLUCOSE, CAPILLARY
Glucose-Capillary: 115 mg/dL — ABNORMAL HIGH (ref 70–99)
Glucose-Capillary: 196 mg/dL — ABNORMAL HIGH (ref 70–99)
Glucose-Capillary: 202 mg/dL — ABNORMAL HIGH (ref 70–99)
Glucose-Capillary: 206 mg/dL — ABNORMAL HIGH (ref 70–99)
Glucose-Capillary: 207 mg/dL — ABNORMAL HIGH (ref 70–99)
Glucose-Capillary: 212 mg/dL — ABNORMAL HIGH (ref 70–99)
Glucose-Capillary: 214 mg/dL — ABNORMAL HIGH (ref 70–99)

## 2020-07-08 LAB — HEPATIC FUNCTION PANEL
ALT: 88 U/L — ABNORMAL HIGH (ref 0–44)
AST: 90 U/L — ABNORMAL HIGH (ref 15–41)
Albumin: 1.5 g/dL — ABNORMAL LOW (ref 3.5–5.0)
Alkaline Phosphatase: 112 U/L (ref 38–126)
Bilirubin, Direct: 0.3 mg/dL — ABNORMAL HIGH (ref 0.0–0.2)
Indirect Bilirubin: 0.4 mg/dL (ref 0.3–0.9)
Total Bilirubin: 0.7 mg/dL (ref 0.3–1.2)
Total Protein: 5.1 g/dL — ABNORMAL LOW (ref 6.5–8.1)

## 2020-07-08 LAB — CBC
HCT: 27.5 % — ABNORMAL LOW (ref 36.0–46.0)
Hemoglobin: 8.8 g/dL — ABNORMAL LOW (ref 12.0–15.0)
MCH: 29.3 pg (ref 26.0–34.0)
MCHC: 32 g/dL (ref 30.0–36.0)
MCV: 91.7 fL (ref 80.0–100.0)
Platelets: 135 10*3/uL — ABNORMAL LOW (ref 150–400)
RBC: 3 MIL/uL — ABNORMAL LOW (ref 3.87–5.11)
RDW: 16.2 % — ABNORMAL HIGH (ref 11.5–15.5)
WBC: 8.7 10*3/uL (ref 4.0–10.5)
nRBC: 0 % (ref 0.0–0.2)

## 2020-07-08 LAB — BODY FLUID CULTURE: Culture: NO GROWTH

## 2020-07-08 NOTE — Progress Notes (Addendum)
  Progress Note    07/08/2020 8:14 AM 3 Days Post-Op  Subjective:  Minimal oral intake yesterday.  She believes she is breathing better.  Having pain to L AKA   Vitals:   07/08/20 0347 07/08/20 0400  BP: (!) 163/56 (!) 161/47  Pulse: 69 71  Resp: 16   Temp: 99.5 F (37.5 C)   SpO2: 97% 97%   Physical Exam: Lungs:  Non labored on 2L by [place] Incisions:  L AKA skin edges darkening; pain to palpation Extremities:  R ATA by doppler Abdomen:  soft Neurologic: A&O  CBC    Component Value Date/Time   WBC 8.7 07/08/2020 0412   RBC 3.00 (L) 07/08/2020 0412   HGB 8.8 (L) 07/08/2020 0412   HCT 27.5 (L) 07/08/2020 0412   PLT 135 (L) 07/08/2020 0412   MCV 91.7 07/08/2020 0412   MCH 29.3 07/08/2020 0412   MCHC 32.0 07/08/2020 0412   RDW 16.2 (H) 07/08/2020 0412   LYMPHSABS 1.3 04/25/2020 0345   MONOABS 1.1 (H) 04/25/2020 0345   EOSABS 0.1 04/25/2020 0345   BASOSABS 0.0 04/25/2020 0345    BMET    Component Value Date/Time   NA 135 07/07/2020 0308   K 3.8 07/07/2020 0308   CL 95 (L) 07/07/2020 0308   CO2 32 07/07/2020 0308   GLUCOSE 199 (H) 07/07/2020 0308   BUN 29 (H) 07/07/2020 0308   CREATININE 0.51 07/07/2020 0308   CALCIUM 7.2 (L) 07/07/2020 0308   GFRNONAA >60 07/07/2020 0308   GFRAA 52 (L) 02/18/2018 1610    INR    Component Value Date/Time   INR 1.0 05/03/2020 1530     Intake/Output Summary (Last 24 hours) at 07/08/2020 4854 Last data filed at 07/08/2020 0700 Gross per 24 hour  Intake 1349.08 ml  Output 2625 ml  Net -1275.92 ml     Assessment/Plan:  70 y.o. female is s/p excision of infected graft material and subsequent L AKA; brought back to OR for R groin bleeding and underwent repair with muscle flap 3 Days Post-Op   Bilateral groin vacs with good seal; serosanguinous collection in cannister R foot well perfused based on doppler exam L AKA demonstrating further tissue changes and increasing pain Continue tube feeds via NG May have to consider  palliative care consult if L AKA continues to worsen   Emilie Rutter, PA-C [facility] [phone] 07/08/2020 8:14 AM  I have interviewed the patient and examined the patient. I agree with the findings by the PA.  No further bleeding issues from the right groin.  If the left AKA does not heal the only option would be a hip disarticulation.  Therefore would agree with considering palliative care consult if the left AKA worsens.  Cari Caraway, MD [phone]

## 2020-07-09 LAB — TYPE AND SCREEN
ABO/RH(D): O NEG
Antibody Screen: NEGATIVE
Unit division: 0
Unit division: 0
Unit division: 0
Unit division: 0
Unit division: 0
Unit division: 0

## 2020-07-09 LAB — BPAM RBC
Blood Product Expiration Date: 202201242359
Blood Product Expiration Date: 202201252359
Blood Product Expiration Date: 202201252359
Blood Product Expiration Date: 202201262359
Blood Product Expiration Date: 202202182359
Blood Product Expiration Date: 202202182359
ISSUE DATE / TIME: 202201190720
ISSUE DATE / TIME: 202201190720
ISSUE DATE / TIME: 202201190756
ISSUE DATE / TIME: 202201190756
Unit Type and Rh: 5100
Unit Type and Rh: 5100
Unit Type and Rh: 9500
Unit Type and Rh: 9500
Unit Type and Rh: 9500
Unit Type and Rh: 9500

## 2020-07-09 LAB — CBC
HCT: 27.2 % — ABNORMAL LOW (ref 36.0–46.0)
Hemoglobin: 8.9 g/dL — ABNORMAL LOW (ref 12.0–15.0)
MCH: 29.8 pg (ref 26.0–34.0)
MCHC: 32.7 g/dL (ref 30.0–36.0)
MCV: 91 fL (ref 80.0–100.0)
Platelets: 154 10*3/uL (ref 150–400)
RBC: 2.99 MIL/uL — ABNORMAL LOW (ref 3.87–5.11)
RDW: 16.1 % — ABNORMAL HIGH (ref 11.5–15.5)
WBC: 7.7 10*3/uL (ref 4.0–10.5)
nRBC: 0 % (ref 0.0–0.2)

## 2020-07-09 LAB — GLUCOSE, CAPILLARY
Glucose-Capillary: 145 mg/dL — ABNORMAL HIGH (ref 70–99)
Glucose-Capillary: 185 mg/dL — ABNORMAL HIGH (ref 70–99)
Glucose-Capillary: 191 mg/dL — ABNORMAL HIGH (ref 70–99)
Glucose-Capillary: 195 mg/dL — ABNORMAL HIGH (ref 70–99)
Glucose-Capillary: 224 mg/dL — ABNORMAL HIGH (ref 70–99)
Glucose-Capillary: 226 mg/dL — ABNORMAL HIGH (ref 70–99)

## 2020-07-09 NOTE — Progress Notes (Addendum)
  Progress Note    07/09/2020 8:21 AM 4 Days Post-Op  Subjective:  Worried about L AKA appearance and pain   Vitals:   07/08/20 2336 07/09/20 0400  BP: (!) 136/47 (!) 139/59  Pulse: 63 67  Resp: 17 16  Temp: 98.5 F (36.9 C)   SpO2: 100% 99%   Physical Exam: Lungs:  Non labored Incisions:  Groin vacs with good seal; serosanguinous drainage in cannister; L thigh and AKA incision edges darkening; color change posterior flap Extremities:  R PT and AT by doppler Abdomen:  soft Neurologic: A&O  CBC    Component Value Date/Time   WBC 7.7 07/09/2020 0500   RBC 2.99 (L) 07/09/2020 0500   HGB 8.9 (L) 07/09/2020 0500   HCT 27.2 (L) 07/09/2020 0500   PLT 154 07/09/2020 0500   MCV 91.0 07/09/2020 0500   MCH 29.8 07/09/2020 0500   MCHC 32.7 07/09/2020 0500   RDW 16.1 (H) 07/09/2020 0500   LYMPHSABS 1.3 04/25/2020 0345   MONOABS 1.1 (H) 04/25/2020 0345   EOSABS 0.1 04/25/2020 0345   BASOSABS 0.0 04/25/2020 0345    BMET    Component Value Date/Time   NA 135 07/07/2020 0308   K 3.8 07/07/2020 0308   CL 95 (L) 07/07/2020 0308   CO2 32 07/07/2020 0308   GLUCOSE 199 (H) 07/07/2020 0308   BUN 29 (H) 07/07/2020 0308   CREATININE 0.51 07/07/2020 0308   CALCIUM 7.2 (L) 07/07/2020 0308   GFRNONAA >60 07/07/2020 0308   GFRAA 52 (L) 02/18/2018 1610    INR    Component Value Date/Time   INR 1.0 05/03/2020 1530     Intake/Output Summary (Last 24 hours) at 07/09/2020 2979 Last data filed at 07/09/2020 0426 Gross per 24 hour  Intake 560 ml  Output 1900 ml  Net -1340 ml     Assessment/Plan:  70 y.o. female is s/p excision of infected graft material and subsequent L AKA; brought back to OR for R groin bleeding and underwent repair with muscle flap  4 Days Post-Op   Bilateral groin vacs with good seal; vac change tomorrow L AKA with further discoloration and tissue changes; patient aware if she worsens she will need hip disarticulation Continue NG tube feeds Continue abx  per ID Palliative care consult tomorrow to discuss goals of care if she is indicated for L hip disarticulation   Holly Rutter, PA-C [facility] [phone] 07/09/2020 8:21 AM  I have interviewed the patient and examined the patient. I agree with the findings by the PA.  Agree that the discoloration of the left AKA is worrisome but currently does not appear to be infected.  For VAC change of the right groin tomorrow.  Cari Caraway, MD [phone]

## 2020-07-10 LAB — HEPATIC FUNCTION PANEL
ALT: 71 U/L — ABNORMAL HIGH (ref 0–44)
AST: 73 U/L — ABNORMAL HIGH (ref 15–41)
Albumin: 1.4 g/dL — ABNORMAL LOW (ref 3.5–5.0)
Alkaline Phosphatase: 137 U/L — ABNORMAL HIGH (ref 38–126)
Bilirubin, Direct: 0.2 mg/dL (ref 0.0–0.2)
Indirect Bilirubin: 0.4 mg/dL (ref 0.3–0.9)
Total Bilirubin: 0.6 mg/dL (ref 0.3–1.2)
Total Protein: 5.5 g/dL — ABNORMAL LOW (ref 6.5–8.1)

## 2020-07-10 LAB — GLUCOSE, CAPILLARY
Glucose-Capillary: 205 mg/dL — ABNORMAL HIGH (ref 70–99)
Glucose-Capillary: 246 mg/dL — ABNORMAL HIGH (ref 70–99)
Glucose-Capillary: 196 mg/dL — ABNORMAL HIGH (ref 70–99)
Glucose-Capillary: 179 mg/dL — ABNORMAL HIGH (ref 70–99)
Glucose-Capillary: 246 mg/dL — ABNORMAL HIGH (ref 70–99)
Glucose-Capillary: 188 mg/dL — ABNORMAL HIGH (ref 70–99)

## 2020-07-10 LAB — PREALBUMIN: Prealbumin: 7.8 mg/dL — ABNORMAL LOW (ref 18–38)

## 2020-07-10 MED ORDER — CHLORHEXIDINE GLUCONATE 0.12 % MT SOLN
15.0000 mL | Freq: Two times a day (BID) | OROMUCOSAL | Status: DC
  Administered 2020-07-11 – 2020-07-16 (×12): 15 mL via OROMUCOSAL
  Filled 2020-07-10 (×10): qty 15

## 2020-07-10 MED ORDER — ORAL CARE MOUTH RINSE
15.0000 mL | Freq: Two times a day (BID) | OROMUCOSAL | Status: DC
  Administered 2020-07-11 – 2020-07-15 (×10): 15 mL via OROMUCOSAL

## 2020-07-10 NOTE — Progress Notes (Addendum)
Progress Note    07/10/2020 7:04 AM 5 Days Post-Op  Subjective:  Chart reviewed.  She denies pain. Having BMs.   Vitals:   07/09/20 2341 07/10/20 0406  BP: (!) 156/55 (!) 170/51  Pulse: 70 73  Resp: 18   Temp: 98.7 F (37.1 C) 98.7 F (37.1 C)  SpO2: 99% 99%    Physical Exam: General appearance: Awake, alert in no apparent distress. Anxious regarding appearance of left AKA Cardiac: Heart rate and rhythm are regular Respirations: Nonlabored Extremities: Amputation site incision is well approximated with darkened skin edges, erythema and edema of flaps. No drainage, flaps are warm to palpation.  Darkened skin edges of medial thigh incision. Brisk DP and PT pulses on right. RLE incisions continue to heal.     CBC    Component Value Date/Time   WBC 7.7 07/09/2020 0500   RBC 2.99 (L) 07/09/2020 0500   HGB 8.9 (L) 07/09/2020 0500   HCT 27.2 (L) 07/09/2020 0500   PLT 154 07/09/2020 0500   MCV 91.0 07/09/2020 0500   MCH 29.8 07/09/2020 0500   MCHC 32.7 07/09/2020 0500   RDW 16.1 (H) 07/09/2020 0500   LYMPHSABS 1.3 04/25/2020 0345   MONOABS 1.1 (H) 04/25/2020 0345   EOSABS 0.1 04/25/2020 0345   BASOSABS 0.0 04/25/2020 0345    BMET    Component Value Date/Time   NA 135 07/07/2020 0308   K 3.8 07/07/2020 0308   CL 95 (L) 07/07/2020 0308   CO2 32 07/07/2020 0308   GLUCOSE 199 (H) 07/07/2020 0308   BUN 29 (H) 07/07/2020 0308   CREATININE 0.51 07/07/2020 0308   CALCIUM 7.2 (L) 07/07/2020 0308   GFRNONAA >60 07/07/2020 0308   GFRAA 52 (L) 02/18/2018 1610     Intake/Output Summary (Last 24 hours) at 07/10/2020 0704 Last data filed at 07/10/2020 0600 Gross per 24 hour  Intake 10 ml  Output 1665 ml  Net -1655 ml    HOSPITAL MEDICATIONS Scheduled Meds: . sodium chloride   Intravenous Once  . sodium chloride   Intravenous Once  . acetaminophen (TYLENOL) oral liquid 160 mg/5 mL  650 mg Oral Q6H  . aspirin  81 mg Oral Daily  . atorvastatin  40 mg Oral Daily  .  chlorhexidine gluconate (MEDLINE KIT)  15 mL Mouth Rinse BID  . Chlorhexidine Gluconate Cloth  6 each Topical Daily  . cholecalciferol  1,000 Units Per Tube Daily  . collagenase   Topical Daily  . docusate  100 mg Oral BID  . enoxaparin (LOVENOX) injection  40 mg Subcutaneous Q24H  . ethambutol  1,200 mg Oral Daily  . feeding supplement (GLUCERNA SHAKE)  237 mL Oral TID BM  . feeding supplement (PROSource TF)  45 mL Per Tube TID  . insulin aspart  0-9 Units Subcutaneous Q4H  . insulin glargine  10 Units Subcutaneous BID  . isoniazid  300 mg Oral Daily  . OLANZapine  2.5 mg Oral QHS  . pantoprazole sodium  40 mg Oral Daily  . polyethylene glycol  17 g Oral Daily  . vitamin B-6  50 mg Oral Daily  . rifampin  600 mg Oral Daily  . sodium chloride flush  10-40 mL Intracatheter Q12H   Continuous Infusions: . sodium chloride    . feeding supplement (OSMOLITE 1.5 CAL) 1,000 mL (07/09/20 1222)  . magnesium sulfate bolus IVPB     PRN Meds:.bisacodyl, fentaNYL (SUBLIMAZE) injection, guaiFENesin-dextromethorphan, hydrALAZINE, ipratropium-albuterol, labetalol, magnesium sulfate bolus IVPB, metoprolol tartrate, ondansetron, oxyCODONE,  phenol, sodium chloride flush  Assessment and Plan: 70 y.o. female is s/p excision of infected graft material and subsequent L AKA; brought back to OR for R groin bleeding and underwent repair with muscle flap RLE well perfused. Left AKA>edema, erythema and darkened skin edges of AKA incision as well as saphenectomy incision Remains afebrile. sys BP 140s-160s; NSR. Hgb stable Wound vacs to both groins-to be changed today.    M [place] cavitary pna-on three drug therapy and tolerating per ID.  Will need 12-18 months of treatment.  Monitoring transaminases per ID>>downward trend continues.  -SaO2 wnl on via [place] -blood sugars: range 145-200-appreciate recommendations. -malnutrition-receiving TF's. Albumin 1.4; pre-albumin down to 7.8   -DVT prophylaxis:   Lovenox  Risa Grill, PA-C [facility] [phone] 07/10/2020  7:04 AM   VASCULAR STAFF ADDENDUM: I have independently interviewed and examined the patient.  I agree with the above.   Many thanks to my partners for helping on this difficult case last week.  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  She has no in-line flow to heal this amputation. While the amputation appeared healthy until this past weekend, I fear that this will eventually dehisce and / or become infected. Should this happen she is at risk for overwhelming sepsis and death.  There are no realistic revascularization options for her. Left axillary-profunda bypass may be technically possible, but is not a good option for her. The requirement for synthetic conduit and her active groin / pulmonary infection makes the risk of recurrent graft infection prohibitively high. Furthermore, this may not provide enough flow to allow her AKA to heal.  Conversion to left hip disarticulation carries a high risk of poor healing. Should this not heal, she would almost certainly expire.  I have offered her a trial of watchful waiting to allow the left AKA to declare itself and only conservative / local revision after this will be considered. She seems most interested in this. She is interested in speaking with palliative care to define goals of care and to make end-of-life plans.   Unfortunately, I think her risk of mortality from this is very high no matter what course of action we take.   Yevonne Aline. Stanford Breed, MD [facility] Phone Number: [phone] 07/10/2020 10:38 AM

## 2020-07-10 NOTE — Progress Notes (Signed)
Occupational Therapy Treatment Patient Details Name: Holly Stevenson MRN: [id number] DOB: [birth year] Today's Date: 07/10/2020    History of present illness 70 yo admitted 12/29 with right groin dehiscence s/p debridement and graft of fem fem BPG. Additional surgeries 12/30 and 12/31 for endarterectomy and thrombolectomy with resultant LLE BPG occlusion and Rt abdominal hematoma.Bil groin VACs.  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 PMHx: HTN, DM, CKD, gout, PAD, COPD, HLD, polyneuropathy, recent bronchoscopy, 12/16-12/22 multiple vascular surgeries. requires emergent return to OR to Repair of right common femoral artery patch due to uncontrolled bleeding. 07/05/20   OT comments  Pt seen in conjunction with PT to maximize pts activity tolerance and optimize pts participation. Pt continues to present with increased pain, decreased activity tolerance and generalized deconditioning impacting pts ability to complete BADLs independently. Pt required MAX A +2 to complete bed mobility. Pt able to sit EOB ~ 8 mins for UB ADLS with min guard for balance. Pt on RA during session with sats WFL. Pt would continue to benefit from skilled occupational therapy while admitted and after d/c to address the below listed limitations in order to improve overall functional mobility and facilitate independence with BADL participation. DC plan remains appropriate, will follow acutely per POC.    Follow Up Recommendations  CIR;Supervision/Assistance - 24 hour    Equipment Recommendations  3 in 1 bedside commode;Tub/shower bench;Wheelchair (measurements OT);Wheelchair cushion (measurements OT)    Recommendations for Other Services      Precautions / Restrictions Precautions Precautions: Fall;Other (comment) Precaution Comments: bil groin VAC, Cortrak, monitor O2, Unstageable PI to coccyx - lower sacral area.       Mobility Bed Mobility Overal bed mobility: Needs Assistance Bed Mobility: Supine to Sit;Sit to Supine      Supine to sit: Max assist;+2 for physical assistance Sit to supine: Max assist;+2 for physical assistance   General bed mobility comments: pt required MAX A +2 for all aspects of bed mobility with pt exiting to L side of bed, MAX A to elevate trunk and scoot hips to EOB with use of bed pad, MAX A +2 to lower trunk back to supine  Transfers                 General transfer comment: defer    Balance Overall balance assessment: Needs assistance Sitting-balance support: Feet supported;No upper extremity supported Sitting balance-Holly Stevenson Scale: Poor Sitting balance - Comments: pt able to sit EOB to complete grooming tasks with min guard for balance                                   ADL either performed or assessed with clinical judgement   ADL Overall ADL's : Needs assistance/impaired     Grooming: Oral care;Sitting;Supervision/safety;Set up;Min guard Grooming Details (indicate cue type and reason): sitting EOB, MIN guard for sitting balance Upper Body Bathing: Moderate assistance;Sitting           Lower Body Dressing: Total assistance;Bed level Lower Body Dressing Details (indicate cue type and reason): to don sock   Toilet Transfer Details (indicate cue type and reason): defer this session         Functional mobility during ADLs: Moderate assistance;Maximal assistance;+2 for physical assistance (bed mobility only) General ADL Comments: pt continues to present with increased pain, decreased activity tolerance and generalized deconditioning     Vision       Perception  Praxis      Cognition Arousal/Alertness: Awake/alert Behavior During Therapy: WFL for tasks assessed/performed Overall Cognitive Status: Within Functional Limits for tasks assessed                                          Exercises     Shoulder Instructions       General Comments pt on 2L O2 upon arrival, pt on RA at baseline, doffed O2 during session  with sats WFL, however pt request to have O2 back on at end of session    Pertinent Vitals/ Pain       Pain Assessment: Faces Faces Pain Scale: Hurts even more Pain Location: L residual limb Pain Descriptors / Indicators: Grimacing;Guarding;Moaning;Discomfort Pain Intervention(s): Monitored during session;Repositioned  Home Living                                          Prior Functioning/Environment              Frequency  Min 2X/week        Progress Toward Goals  OT Goals(current goals can now be found in the care plan section)  Progress towards OT goals: Progressing toward goals  Acute Rehab OT Goals Patient Stated Goal: get better and go home OT Goal Formulation: With patient Time For Goal Achievement: 07/18/20 Potential to Achieve Goals: Good  Plan Discharge plan remains appropriate;Frequency remains appropriate    Co-evaluation    PT/OT/SLP Co-Evaluation/Treatment: Yes Reason for Co-Treatment: For patient/therapist safety;To address functional/ADL transfers          AM-PAC OT "6 Clicks" Daily Activity     Outcome Measure   Help from another person eating meals?: None Help from another person taking care of personal grooming?: A Little Help from another person toileting, which includes using toliet, bedpan, or urinal?: Total Help from another person bathing (including washing, rinsing, drying)?: A Lot Help from another person to put on and taking off regular upper body clothing?: A Little Help from another person to put on and taking off regular lower body clothing?: A Lot 6 Click Score: 15    End of Session Equipment Utilized During Treatment: Oxygen;Other (comment) (2L)  OT Visit Diagnosis: Muscle weakness (generalized) (M62.81);Pain;Other abnormalities of gait and mobility (R26.89) Pain - Right/Left: Left Pain - part of body: Leg   Activity Tolerance Patient tolerated treatment well   Patient Left in bed;with call bell/phone  within reach;with bed alarm set   Nurse Communication Mobility status        Time: 1610-9604 OT Time Calculation (min): 23 min  Charges: OT General Charges $OT Visit: 1 Visit OT Treatments $Self Care/Home Management : 8-22 mins  Lenor Derrick., COTA/L Acute Rehabilitation Services [phone]    Barron Schmid 07/10/2020, 3:39 PM

## 2020-07-10 NOTE — Progress Notes (Signed)
Physical Therapy Treatment Patient Details Name: Holly Stevenson MRN: [id number] DOB: 1950/08/19 Today's Date: 07/10/2020    History of Present Illness 70 yo admitted 12/29 with right groin dehiscence s/p debridement and graft of fem fem BPG. Additional surgeries 12/30 and 12/31 for endarterectomy and thrombolectomy with resultant LLE BPG occlusion and Rt abdominal hematoma.Bil groin VACs.  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 PMHx: HTN, DM, CKD, gout, PAD, COPD, HLD, polyneuropathy, recent bronchoscopy, 12/16-12/22 multiple vascular surgeries. requires emergent return to OR to Repair of right common femoral artery patch due to uncontrolled bleeding. 07/05/20    PT Comments    Pt supine in bed.  Focused on sitting edge of bed for ADLs.  Pt fatigues quickly during activity.   Her residual limb is red and swollen.  Pt continues to benefit from rehab in a post acute setting.     Follow Up Recommendations  Supervision/Assistance - 24 hour;CIR     Equipment Recommendations  Wheelchair (measurements PT);Wheelchair cushion (measurements PT);3in1 (PT);Hospital bed    Recommendations for Other Services       Precautions / Restrictions Precautions Precautions: Fall;Other (comment) Precaution Comments: bil groin VAC, Cortrak, monitor O2, Unstageable PI to coccyx - lower sacral area. Restrictions Weight Bearing Restrictions: Yes LLE Weight Bearing: Non weight bearing    Mobility  Bed Mobility Overal bed mobility: Needs Assistance Bed Mobility: Supine to Sit;Sit to Supine     Supine to sit: Max assist;+2 for physical assistance Sit to supine: Max assist;+2 for physical assistance   General bed mobility comments: pt required MAX A +2 for all aspects of bed mobility with pt exiting to L side of bed, MAX A to elevate trunk and scoot hips to EOB with use of bed pad, MAX A +2 to lower trunk back to supine, able to lift RLE back to bed unassisted after lowering trunk.  Once in supine used head board to  boost up in bed.  Transfers Overall transfer level:  (NT focused on sitting balance edge of bed during ADLs.)               General transfer comment: defer  Ambulation/Gait                 Stairs             Wheelchair Mobility    Modified Rankin (Stroke Patients Only)       Balance Overall balance assessment: Needs assistance Sitting-balance support: Feet supported;No upper extremity supported Sitting balance-Leahy Scale: Poor Sitting balance - Comments: pt able to sit EOB to complete grooming tasks with min guard for balance.  Forward lean holding to tray table.                                    Cognition Arousal/Alertness: Awake/alert Behavior During Therapy: WFL for tasks assessed/performed Overall Cognitive Status: Within Functional Limits for tasks assessed                                        Exercises      General Comments General comments (skin integrity, edema, etc.): pt on 2L O2 upon arrival, pt on RA at baseline, doffed O2 during session with sats [facility], however pt request to have O2 back on at end of session      Pertinent Vitals/Pain  Pain Assessment: Faces Faces Pain Scale: Hurts even more Pain Location: L residual limb Pain Descriptors / Indicators: Grimacing;Guarding;Moaning;Discomfort Pain Intervention(s): Monitored during session;Repositioned    Home Living                      Prior Function            PT Goals (current goals can now be found in the care plan section) Acute Rehab PT Goals Patient Stated Goal: get better and go home Potential to Achieve Goals: Fair Progress towards PT goals: Progressing toward goals    Frequency           PT Plan Current plan remains appropriate    Co-evaluation PT/OT/SLP Co-Evaluation/Treatment: Yes Reason for Co-Treatment: Complexity of the patient's impairments (multi-system involvement) PT goals addressed during session:  Mobility/safety with mobility        AM-PAC PT "6 Clicks" Mobility   Outcome Measure  Help needed turning from your back to your side while in a flat bed without using bedrails?: A Lot Help needed moving from lying on your back to sitting on the side of a flat bed without using bedrails?: A Lot Help needed moving to and from a bed to a chair (including a wheelchair)?: A Lot Help needed standing up from a chair using your arms (e.g., wheelchair or bedside chair)?: A Lot Help needed to walk in hospital room?: A Lot Help needed climbing 3-5 steps with a railing? : Total 6 Click Score: 11    End of Session Equipment Utilized During Treatment: Oxygen Activity Tolerance: Patient limited by fatigue Patient left: in bed;with call bell/phone within reach;with bed alarm set Nurse Communication: Mobility status;Need for lift equipment PT Visit Diagnosis: Unsteadiness on feet (R26.81);Other abnormalities of gait and mobility (R26.89);Muscle weakness (generalized) (M62.81);Difficulty in walking, not elsewhere classified (R26.2);Pain Pain - Right/Left: Left Pain - part of body: Leg     Time: 1340-1403 PT Time Calculation (min) (ACUTE ONLY): 23 min  Charges:  $Therapeutic Activity: 8-22 mins                     Bonney Leitz , PTA Acute Rehabilitation Services Pager [phone] Office [phone]     Jeniah Kishi Artis Delay 07/10/2020, 4:10 PM

## 2020-07-10 NOTE — Anesthesia Postprocedure Evaluation (Signed)
Anesthesia Post Note  Patient: Holly Stevenson  Procedure(s) Performed: EXPLORATION OF RIGHT GROIN WITH REPAIR OF FEMORAL ARTERY (Right )     Patient location during evaluation: SICU Anesthesia Type: General Level of consciousness: sedated and patient remains intubated per anesthesia plan Pain management: pain level controlled Vital Signs Assessment: post-procedure vital signs reviewed and stable Respiratory status: patient remains intubated per anesthesia plan and patient on ventilator - see flowsheet for VS Cardiovascular status: stable Postop Assessment: no apparent nausea or vomiting Anesthetic complications: no   No complications documented.  Last Vitals:  Vitals:   07/10/20 0406 07/10/20 0735  BP: (!) 170/51 (!) 167/49  Pulse: 73 68  Resp:  18  Temp: 37.1 C 37 C  SpO2: 99% 98%    Last Pain:  Vitals:   07/10/20 0735  TempSrc: Oral  PainSc:                  Zeanna Sunde COKER

## 2020-07-10 NOTE — Progress Notes (Signed)
Air mattress ordered for patient. No air mattress is available at this time. Will follow up as a bed becomes available.  Versie Starks, RN

## 2020-07-10 NOTE — Consult Note (Signed)
WOC Nurse Consult Note: Patient receiving care in [facility] 4E10. One piece of black foam removed from each groin, one piece placed into each groin wound. Drape applied, immediate seal obtained. Patient tolerated very well.  Supplies in room for Wednesday. Helmut Muster, RN, MSN, CWOCN, CNS-BC, pager [phone]

## 2020-07-11 LAB — GLUCOSE, CAPILLARY
Glucose-Capillary: 213 mg/dL — ABNORMAL HIGH (ref 70–99)
Glucose-Capillary: 206 mg/dL — ABNORMAL HIGH (ref 70–99)
Glucose-Capillary: 168 mg/dL — ABNORMAL HIGH (ref 70–99)
Glucose-Capillary: 190 mg/dL — ABNORMAL HIGH (ref 70–99)
Glucose-Capillary: 211 mg/dL — ABNORMAL HIGH (ref 70–99)
Glucose-Capillary: 158 mg/dL — ABNORMAL HIGH (ref 70–99)

## 2020-07-11 NOTE — Progress Notes (Signed)
Inpatient Rehab Admissions Coordinator:   Pt. Continues to require max A +2 for bed mobility and has not yet attempted transfers. [facility] team with continue to follow for 1-2 more days but if Pt remains unable to progress beyond be-level activities, SNF would likely be a more appropriate venue.  Megan Salon, MS, CCC-SLP Rehab Admissions Coordinator  [phone] (celll) [phone] (office)

## 2020-07-11 NOTE — Progress Notes (Addendum)
[facility] [place]  Subjective  - Her O2 SAT are 100 % on O2 [place], but she states she feels like she is having to work to breath.   Objective (!) 147/49 (!) 58 98.7 F (37.1 C) (Oral) 18 97%  Intake/Output Summary (Last 24 hours) at 07/11/2020 0727 Last data filed at 07/11/2020 0500 Gross per 24 hour  Intake 3970 ml  Output 1775 ml  Net 2195 ml    Left AKA incision line ischemic changes Right LE incisions healing well with brisk doppler signals AT/PT Wound va B groins to suction, 75 cc Op last 24 hours Lungs O2 support SAT 100 % Heart RRR   Assessment/Planning: 69 y.o.femaleis s/p excision of infected graft material and subsequent L AKA; brought back to OR for R groin bleeding and underwent repair with muscle flap RLE well perfused. Left AKA>edema, erythema and darkened skin edges of AKA incision as well as saphenectomy incision  She is afebrile, no leukocytosis,  M [place] cavitary pna-on three drug therapy and tolerating per ID. Will need 12-18 months of treatment. Monitoring transaminases per ID>>downward trend continues.  malnutrition-receiving TF's.  Pending palliative  Dr. Lenell Antu has suggested further intervention on the left AKA. Please refer to the note on 07/10/20 for possible interventions.    Mosetta Pigeon 07/11/2020 7:27 AM --  Laboratory Lab Results: Recent Labs    07/09/20 0500  WBC 7.7  HGB 8.9*  HCT 27.2*  PLT 154   BMET No results for input(s): NA, K, CL, CO2, GLUCOSE, BUN, CREATININE, CALCIUM in the last 72 hours.  COAG Lab Results  Component Value Date   INR 1.0 05/03/2020   INR 1.0 04/25/2020   No results found for: PTT  VASCULAR STAFF ADDENDUM: I have independently interviewed and examined the patient. I agree with the above.  See yesterday's note for details RE: options going forward from vascular standpoint. Pending palliative care evaluation.  Rande Brunt. Lenell Antu, MD [facility] Phone Number: [phone] 07/11/2020 12:22 PM

## 2020-07-12 ENCOUNTER — Inpatient Hospital Stay (HOSPITAL_COMMUNITY): Payer: BC Managed Care – PPO

## 2020-07-12 LAB — BLOOD GAS, ARTERIAL
Acid-Base Excess: 1 mmol/L (ref 0.0–2.0)
Bicarbonate: 26 mmol/L (ref 20.0–28.0)
Drawn by: 35849
FIO2: 100
O2 Saturation: 99.2 %
Patient temperature: 37
pCO2 arterial: 48.8 mmHg — ABNORMAL HIGH (ref 32.0–48.0)
pH, Arterial: 7.346 — ABNORMAL LOW (ref 7.350–7.450)
pO2, Arterial: 182 mmHg — ABNORMAL HIGH (ref 83.0–108.0)

## 2020-07-12 LAB — GLUCOSE, CAPILLARY
Glucose-Capillary: 114 mg/dL — ABNORMAL HIGH (ref 70–99)
Glucose-Capillary: 127 mg/dL — ABNORMAL HIGH (ref 70–99)
Glucose-Capillary: 155 mg/dL — ABNORMAL HIGH (ref 70–99)
Glucose-Capillary: 192 mg/dL — ABNORMAL HIGH (ref 70–99)
Glucose-Capillary: 202 mg/dL — ABNORMAL HIGH (ref 70–99)
Glucose-Capillary: 215 mg/dL — ABNORMAL HIGH (ref 70–99)

## 2020-07-12 LAB — SARS CORONAVIRUS 2 (TAT 6-24 HRS): SARS Coronavirus 2: NEGATIVE

## 2020-07-12 MED ORDER — FUROSEMIDE 10 MG/ML IJ SOLN
INTRAMUSCULAR | Status: AC
  Filled 2020-07-12: qty 4

## 2020-07-12 MED ORDER — FUROSEMIDE 10 MG/ML IJ SOLN
40.0000 mg | Freq: Once | INTRAMUSCULAR | Status: AC
  Administered 2020-07-12: 40 mg via INTRAVENOUS

## 2020-07-12 MED ORDER — HYDROXYZINE HCL 25 MG PO TABS
25.0000 mg | ORAL_TABLET | Freq: Three times a day (TID) | ORAL | Status: DC | PRN
  Administered 2020-07-12 – 2020-07-13 (×2): 25 mg via ORAL
  Filled 2020-07-12 (×2): qty 1

## 2020-07-12 MED ORDER — FUROSEMIDE 10 MG/ML IJ SOLN
40.0000 mg | Freq: Once | INTRAMUSCULAR | Status: AC
  Administered 2020-07-12: 40 mg via INTRAVENOUS
  Filled 2020-07-12: qty 4

## 2020-07-12 NOTE — Progress Notes (Signed)
Nutrition Follow Up  DOCUMENTATION CODES:   Not applicable  INTERVENTION:   Tube feeding:  -Osmolite 1.5 @ 55 ml/hr via Cortrak (1320 ml) -ProSource TF 45 ml TID  Provides: 2100 kcals, 116 grams protein, 1006 ml free water. Meets 100% of needs.    Glucerna Shake po TID, each supplement provides 220 kcal and 10 grams of protein  May wean tube feeding as PO intake progresses.   NUTRITION DIAGNOSIS:   Increased nutrient needs related to post-op healing as evidenced by estimated needs.  Ongoing  GOAL:   Patient will meet greater than or equal to 90% of their needs   Addressed via TF  MONITOR:   PO intake,Supplement acceptance,Weight trends,Labs,I & O's  REASON FOR ASSESSMENT:   Consult Assessment of nutrition requirement/status  ASSESSMENT:   Patient with PMH significant for DM, HTN, PVD s/p R groin exploration, repair of pseudoaneurysm with interposition grafting, thrombectomy of fem-fem bypass, thrombectomy of L fem-pop bypass. Presents this admission with R groin dehiscence.   12/29- s/p excisional debridement to R groin 12/30- s/p excision of R to L fem-fem bypass, reopening of L groin, stenting 12/31- s/p L common femoral and external iliac endarterectomy, L popliteal artery endarterectomy, stenting  1/02- L AKA, bilateral groin VAC change 01/10- gastric Cortrak placed, nocturnal tube feeding started 01/19- exploration of R groin for bleeding, R chest tube, extubated  01/20- thoracentesis (800 ml drained), R chest tube  L AKA with new ischemic changes. PMT to revisit GOC.   Tolerating Osmolite 1.5 @ 55 ml/hr. Intake remains minimal. Finishing bites to ~25% per pt report. Consumed 1.5 Glucerna PO yesterday. Had half of one this am. Complaining of ongoing fullness during the day. Continue with continuous tube feedings, may need to transition to shorter time on feeds to allow time to eat during the day.   Admission weight: 87.9 kg  Current weight: 81.1 kg    UOP: 1750 ml x 24 hrs  Wound VAC (L/R groin): 75 ml x 24 hrs   Medications: colace, SS novolog, lantus, miralax, vit B6 Labs: CBG 127-215  Diet Order            Diet regular Room service appropriate? Yes; Fluid consistency: Thin  Diet effective now                 EDUCATION NEEDS:   Education needs have been addressed  Skin:  Skin Assessment: Skin Integrity Issues: Skin Integrity Issues:: Incisions,Wound VAC,Stage I,DTI DTI: buttocks Stage II: sacrum Wound Vac: L/R groin Incisions: L leg, R ankle, groin x2  Last BM:  1/25  Height:   Ht Readings from Last 1 Encounters:  07/05/20 5\' 8"  (1.727 m)    Weight:   Wt Readings from Last 1 Encounters:  07/07/20 81.1 kg    BMI:  Body mass index is 27.19 kg/m.  Estimated Nutritional Needs:   Kcal:  2000-2200 kcal  Protein:  100-120 grams  Fluid:  >/= 2 L/day  07/09/20 RD, LDN Clinical Nutrition Pager listed in AMION

## 2020-07-12 NOTE — Consult Note (Signed)
Consultation Note Date: 07/12/2020   Patient Name: Holly Stevenson  DOB: [birth year]  MRN: [id number]  Age / Sex: 70 y.o., female   PCP: Holly Small, MD Referring Physician: Leonie Douglas, MD   REASON FOR CONSULTATION:Establishing goals of care  Palliative Care consult requested for goals of care discussion in this 70 y.o. female with multiple medical problems including hypertension, diabetes, hyperlipidemia, and most recently on 05/23/20 w/p right iliofemoral endarterectomy and angioplasty with R to L femorofemoral bypass and L femoral to AK popliteal bypass. Patient ws seen by Vascular team outpatient with concerns for LLE numbness and increased pain in left foot. During their evaluation left foot cold to touch with inability to find doppler signal. LLE ischemic. 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   Clinical Assessment and Goals of Care: I have reviewed medical records including lab results, imaging, Epic notes, and MAR, received report from the bedside RN. I spoke with patient  to discuss diagnosis prognosis, GOC, EOL wishes, disposition and options.  I introduced Palliative Medicine as specialized medical care for people living with serious illness. It focuses on providing relief from the symptoms and stress of a serious illness. The goal is to improve quality of life for both the patient and the family. Patient verbalized understanding.   We discussed a brief life review of the patient, along with her functional and nutritional status. Holly Stevenson reports prior to admission she lived alone. She is widowed. Retired Haematologist. She has 1 daughter, Holly Stevenson.   Prior to admission and her surgery she reports being able to independently provide all ADLs.  We discussed Her current illness and what it means in the larger context of Her on-going co-morbidities. With specific discussions regarding her recent AKA, ischemia, and overall  functional and nutritional state. Natural disease trajectory and expectations at EOL were discussed.  I attempted to elicit values and goals of care important to the patient.    Holly Stevenson verbalizes a somewhat understanding of what her current health challenges are. She expresses being overwhelmed with the entire process and surgeries. She understands the criticalness of her health however feels that she needs to speak with her daughter and possibly would like all providers to update her daughter.   Advanced directives, concepts specific to code status, artifical feeding and hydration, and rehospitalization were considered and discussed. Patient reports she does have a documented advanced directive. She reports her daughter, Holly Stevenson is her HCPOA. Patient shares her wishes reporting she would not want long-term artificial feedings such as PEG and would not want to live in a prolonged debilitating state. She confirms DNR/DNI.   I attempted to discuss palliative and outpatient hospice support, however patient declines to speak on this further. She reports she needs to speak with her daughter before going into further discussions. She wants to make sure that she and her daughter are on the same page and that Holly Stevenson has a full understanding of what patient is facing in regards to health challenges and options.   Patient is requesting follow-up tomorrow and potentially would like discussions by medical team with her daughter.   Questions and concerns were addressed.The family was encouraged to call with questions or concerns.  PMT will continue to support holistically.   SOCIAL HISTORY:     reports that she has been smoking cigarettes. She has a 6.00 pack-year smoking history. She has never used smokeless tobacco. She reports previous alcohol use.  She reports that she does not use drugs.  CODE STATUS: DNR  ADVANCE DIRECTIVES: Primary Decision Maker: Patient is able to make decisions, however if not  able her daughter, Holly Stevenson is her designated Management consultant.    SYMPTOM MANAGEMENT: Per attending   Palliative Prophylaxis:   Aspiration, Bowel Regimen, Delirium Protocol, Eye Care, Frequent Pain Assessment, Oral Care, Palliative Wound Care and Turn Reposition  PSYCHO-SOCIAL/SPIRITUAL:  Support System: Family  Desire for further Chaplaincy support:NO   Additional Recommendations (Limitations, Scope, Preferences):  No Artificial Feeding continue to treat the treatable.  Education on hospice/palliative    PAST MEDICAL HISTORY: Past Medical History:  Diagnosis Date  . Diabetes mellitus without complication (HCC)    Type II  . High cholesterol   . Hypertension   . Pneumonia     ALLERGIES:  has No Known Allergies.   MEDICATIONS:  Current Facility-Administered Medications  Medication Dose Route Frequency Provider Last Rate Last Admin  . 0.9 %  sodium chloride infusion  250 mL Intravenous Continuous Clinton Gallant M, PA-C      . acetaminophen (TYLENOL) 160 MG/5ML solution 650 mg  650 mg Oral Q6H Mosetta Anis, RPH   650 mg at 07/12/20 1204  . aspirin chewable tablet 81 mg  81 mg Oral Daily Mosetta Anis, RPH   81 mg at 07/12/20 6384  . atorvastatin (LIPITOR) tablet 40 mg  40 mg Oral Daily Mosetta Anis, RPH   40 mg at 07/12/20 6659  . bisacodyl (DULCOLAX) suppository 10 mg  10 mg Rectal Daily PRN Clinton Gallant M, PA-C      . chlorhexidine (PERIDEX) 0.12 % solution 15 mL  15 mL Mouth Rinse BID Holly Douglas, MD   15 mL at 07/12/20 0829  . Chlorhexidine Gluconate Cloth 2 % PADS 6 each  6 each Topical Daily Lars Mage, PA-C   6 each at 07/12/20 0831  . cholecalciferol (VITAMIN D3) tablet 1,000 Units  1,000 Units Per Tube Daily Lars Mage, PA-C   1,000 Units at 07/12/20 9357  . collagenase (SANTYL) ointment   Topical Daily Holly Douglas, MD   Given at 07/12/20 [phone]  . docusate (COLACE) 50 MG/5ML liquid 100 mg  100 mg Oral BID Mosetta Anis, RPH    100 mg at 07/10/20 2100  . enoxaparin (LOVENOX) injection 40 mg  40 mg Subcutaneous Q24H Clinton Gallant M, PA-C   40 mg at 07/12/20 9390  . ethambutol (MYAMBUTOL) tablet 1,200 mg  1,200 mg Oral Daily Mosetta Anis, RPH   1,200 mg at 07/12/20 3009  . feeding supplement (GLUCERNA SHAKE) (GLUCERNA SHAKE) liquid 237 mL  237 mL Oral TID BM Holly Douglas, MD   237 mL at 07/12/20 1016  . feeding supplement (OSMOLITE 1.5 CAL) liquid 1,000 mL  1,000 mL Per Tube Continuous Lars Mage, PA-C 55 mL/hr at 07/12/20 0523 1,000 mL at 07/12/20 0523  . feeding supplement (PROSource TF) liquid 45 mL  45 mL Per Tube TID Holly Douglas, MD   45 mL at 07/12/20 0828  . fentaNYL (SUBLIMAZE) injection 25-100 mcg  25-100 mcg Intravenous Q30 min PRN Canary Brim L, NP   100 mcg at 07/05/20 2103  . guaiFENesin-dextromethorphan (ROBITUSSIN DM) 100-10 MG/5ML syrup 15 mL  15 mL Oral Q4H PRN Mosetta Anis, RPH      . hydrALAZINE (APRESOLINE) injection 10-40 mg  10-40 mg Intravenous Q4H PRN Clinton Gallant M, PA-C   20 mg at 07/05/20  1812  . hydrOXYzine (ATARAX/VISTARIL) tablet 25 mg  25 mg Oral TID PRN Holly Douglas, MD   25 mg at 07/12/20 1204  . insulin aspart (novoLOG) injection 0-9 Units  0-9 Units Subcutaneous Q4H Ollis, Brandi L, NP   3 Units at 07/12/20 0829  . insulin glargine (LANTUS) injection 10 Units  10 Units Subcutaneous BID Lars Mage, PA-C   10 Units at 07/12/20 0830  . ipratropium-albuterol (DUONEB) 0.5-2.5 (3) MG/3ML nebulizer solution 3 mL  3 mL Nebulization Q6H PRN Lars Mage, PA-C   3 mL at 07/12/20 1112  . isoniazid (NYDRAZID) tablet 300 mg  300 mg Oral Daily Mosetta Anis, RPH   300 mg at 07/12/20 4847  . labetalol (NORMODYNE) injection 20 mg  20 mg Intravenous Q2H PRN Clinton Gallant M, PA-C   20 mg at 07/11/20 2104  . magnesium sulfate IVPB 2 g 50 mL  2 g Intravenous Daily PRN Clinton Gallant M, PA-C      . MEDLINE mouth rinse  15 mL Mouth Rinse q12n4p Holly Douglas, MD    15 mL at 07/11/20 1549  . metoprolol tartrate (LOPRESSOR) injection 2-5 mg  2-5 mg Intravenous Q2H PRN Clinton Gallant M, PA-C      . OLANZapine (ZYPREXA) tablet 2.5 mg  2.5 mg Oral QHS Mosetta Anis, RPH   2.5 mg at 07/11/20 2053  . ondansetron (ZOFRAN) injection 4 mg  4 mg Intravenous Q6H PRN Clinton Gallant M, PA-C      . oxyCODONE (Oxy IR/ROXICODONE) immediate release tablet 5 mg  5 mg Oral Q4H PRN Lorin Glass, MD   5 mg at 07/12/20 0455  . pantoprazole sodium (PROTONIX) 40 mg/20 mL oral suspension 40 mg  40 mg Oral Daily Mosetta Anis, RPH   40 mg at 07/12/20 1016  . phenol (CHLORASEPTIC) mouth spray 1 spray  1 spray Mouth/Throat PRN Clinton Gallant M, PA-C      . polyethylene glycol (MIRALAX / GLYCOLAX) packet 17 g  17 g Oral Daily Mosetta Anis, RPH   17 g at 07/08/20 0919  . pyridOXINE (VITAMIN B-6) tablet 50 mg  50 mg Oral Daily Mosetta Anis, RPH   50 mg at 07/12/20 2072  . rifampin (RIFADIN) 25 mg/mL oral suspension 600 mg  600 mg Oral Daily Mosetta Anis, RPH   600 mg at 07/12/20 1016  . sodium chloride flush (NS) 0.9 % injection 10-40 mL  10-40 mL Intracatheter Q12H Holly Douglas, MD   10 mL at 07/12/20 [phone]  . sodium chloride flush (NS) 0.9 % injection 10-40 mL  10-40 mL Intracatheter PRN Holly Douglas, MD        VITAL SIGNS: BP (!) 210/74 (BP Location: Left Arm)   Pulse 77   Temp (!) 97.5 F (36.4 C) (Oral)   Resp 20   Ht 5\' 8"  (1.727 m)   Wt 81.1 kg   SpO2 100%   BMI 27.19 kg/m  Filed Weights   06/21/20 1432 07/01/20 0409 07/07/20 0300  Weight: 87.9 kg 99.3 kg 81.1 kg    Estimated body mass index is 27.19 kg/m as calculated from the following:   Height as of this encounter: 5\' 8"  (1.727 m).   Weight as of this encounter: 81.1 kg.  LABS: CBC:    Component Value Date/Time   WBC 7.7 07/09/2020 0500   HGB 8.9 (L) 07/09/2020 0500   HCT 27.2 (L) 07/09/2020 0500   PLT 154  07/09/2020 0500   Comprehensive Metabolic Panel:    Component  Value Date/Time   NA 135 07/07/2020 0308   K 3.8 07/07/2020 0308   BUN 29 (H) 07/07/2020 0308   CREATININE 0.51 07/07/2020 0308   ALBUMIN 1.4 (L) 07/10/2020 0310    Prognosis: Guarded to Poor in the setting of infected graft, L AKA, flap repair, now with LAKA ischemic changes with no optimal interventions per Vascular team, [place] pneumonia.   Discharge Planning:  To Be Determined  Recommendations: . DNR/DNI-as confirmed by patient . Continue with current plan of care per medical team  . No artificial feedings/PEG (prolonged) . Patient expresses she needs to have further discussions with her daughter and make sure she has a clear understanding of her current condition. Would like to make decisions once she knows they are on the same page. Shares she may want providers to speak with daughter and provide updates.  . Request follow-up tomorrow for further discussion and possibility of including daughter if needed.  Marland Kitchen PMT will continue to support and follow. Please call team line with urgent needs.   Palliative Performance Scale:                 Patient expressed understanding and was in agreement with this plan.   Thank you for allowing the Palliative Medicine Team to assist in the care of this patient.  Time In: 1035 Time Out: 1120 Time Total: 45 min.   Visit consisted of counseling and education dealing with the complex and emotionally intense issues of symptom management and palliative care in the setting of serious and potentially life-threatening illness.Greater than 50%  of this time was spent counseling and coordinating care related to the above assessment and plan.  Signed by:  Willette Alma, AGPCNP-BC Palliative Medicine Team  Phone: [phone] Pager: [phone] Amion: Thea Alken

## 2020-07-12 NOTE — Progress Notes (Addendum)
     Referral received for Holly Stevenson :goals of care discussion. Chart reviewed and updates received from RN. Patient unable to speak at this time. Having increased shortness of breath. Per RN Rapid Response now at the bedside.   RN aware I will reach out later today and continue to follow chart for opportunity to engage with patient and/or family if she becomes unable to do so.   Detailed note and recommendations to follow once GOC has been completed.   Thank you for your referral and allowing PMT to assist in Holly Stevenson's care.   Willette Alma, AGPCNP-BC Palliative Medicine Team  Phone: [phone] Pager: [phone] Amion: N. Cousar   NO CHARGE

## 2020-07-12 NOTE — Progress Notes (Signed)
Patient experiencing shortness of breath. Respiratory called and gave pt breathing treatment. Current BP 210/74, Pulse 77. Current oxygen saturation at 100% on non rebreather. Patient saturation was 88% on 4L. Eveland PA notified. [facility] notified.

## 2020-07-12 NOTE — Progress Notes (Signed)
PT Cancellation Note  Patient Details Name: Holly Stevenson MRN: [id number] DOB: 12-16-1950   Cancelled Treatment:    Reason Eval/Treat Not Completed: (P) Medical issues which prohibited therapy (Pt remains on bipap due to hypoxia, will defer PT at this time.)   Florestine Avers 07/12/2020, 3:44 PM  Bonney Leitz , PTA Acute Rehabilitation Services Pager [phone] Office [phone]

## 2020-07-12 NOTE — Significant Event (Signed)
Rapid Response Event Note   Reason for Call :  Increased work of breathing, tachypnea  Initial Focused Assessment:  Pt lying in bed, AO. Following commands and moving extremities appropriately. She has increased work of breathing, labored. Lung sounds are rhonchus throughout, prolonged expiration. Skin is warm, moist. Abdomen is soft. Pt denies pain.   VS: BP 191/63, HR 81, RR 38, SpO2 98% on 100% NRB  Interventions:  -Order received for 40mg  IV Lasix -BiPAP 12/6 50% - tube feeds stopped  Orders received prior to my arrival: -ABG: 7.346/ 48.8/ 182/ 26 -CXR  Plan of Care:  -Document urine output -BiPAP for increased work of breathing- pt anxious in regards to breathing and tight fit of BiPAP mask- encourage compliance -Oral suction at [facility], oral care per protocol  Call rapid response for additional needs.  Event Summary:  MD Notified: [facility] Call TimeAdonis Housekeeper Arrival Time: 1225 End Time: 1310  : [id number], RN

## 2020-07-12 NOTE — Progress Notes (Signed)
OT Cancellation Note  Patient Details Name: Greyson Riccardi MRN: [id number] DOB: Apr 30, 1951   Cancelled Treatment:    Reason Eval/Treat Not Completed: Medical issues which prohibited therapy.  Rapid Response called for Increased work of breathing, tachypnea.  Patient placed on BiPAP, OT to continue efforts as appropriate.    Galen Malkowski D Noa Galvao 07/12/2020, 2:14 PM

## 2020-07-12 NOTE — Consult Note (Signed)
WOC Nurse wound follow up Patient receiving care in [facility] 4E10.  Patient with increased respiratory rate and effort today.  When asked if she was having trouble with her breathing, she responded yes, and stated it has been this way since this morning. I have communicated this to Dr. Karie Schwalbe. [facility] via [employer]. He has acknowledged receipt of my message. Wound type: bilateral groin VAC dressing changes. Measurement: left groin wound measures 1.8 cm x 7 cm x 2.5 cm.  No granulation tissue observed in wound bed, but holding its own.  Right groin wound is deteriorating.  There is now a deep crevice. The depth in the crevice measures 4 cm.  I chose not to pack a piece of foam into the crevice considering her most recent Vein Patch repair of a patch defect.  I cannot see what the base of the crevice joins with.  This wound is not improving at all.  The wound measures 3 cm x 9.5 cm x 4 cm.  Neither wound seems to be making any improvement in terms of presence of granulation tissue or wound closure.  Because of this, perhaps therapy could be switched to a dressing of some type and discontinue the [facility] therapy.   Wound bed: Drainage (amount, consistency, odor) serosanginous in cannister Periwound: Dressing procedure/placement/frequency: one piece of black foam removed from each wound, one piece placed, seals obtained.  The right groin was a greater challenge to obtain a seal on, as I was not able to lay the patient flat, and could not keep the [employer] down long enough to easily obtain a seal; nevertheless, I did achieve a seal. Helmut Muster, RN, MSN, CWOCN, CNS-BC, pager [phone]

## 2020-07-12 NOTE — Progress Notes (Signed)
  Progress Note    07/12/2020 7:39 AM 7 Days Post-Op  Subjective:  No complaints this morning   Vitals:   07/11/20 2319 07/12/20 0354  BP: (!) 151/59 (!) 157/55  Pulse: 65 61  Resp: 19 19  Temp: 98 F (36.7 C) 98.1 F (36.7 C)  SpO2: 98% 98%   Physical Exam: Lungs:  Non labored Incisions:  RLE incisions healing well; wound vac bilateral groins with good seal, serous drainage; L AKA incision with darkening skin edges Extremities:  R PT and ATA signal by doppler Abdomen:  soft Neurologic: A&O  CBC    Component Value Date/Time   WBC 7.7 07/09/2020 0500   RBC 2.99 (L) 07/09/2020 0500   HGB 8.9 (L) 07/09/2020 0500   HCT 27.2 (L) 07/09/2020 0500   PLT 154 07/09/2020 0500   MCV 91.0 07/09/2020 0500   MCH 29.8 07/09/2020 0500   MCHC 32.7 07/09/2020 0500   RDW 16.1 (H) 07/09/2020 0500   LYMPHSABS 1.3 04/25/2020 0345   MONOABS 1.1 (H) 04/25/2020 0345   EOSABS 0.1 04/25/2020 0345   BASOSABS 0.0 04/25/2020 0345    BMET    Component Value Date/Time   NA 135 07/07/2020 0308   K 3.8 07/07/2020 0308   CL 95 (L) 07/07/2020 0308   CO2 32 07/07/2020 0308   GLUCOSE 199 (H) 07/07/2020 0308   BUN 29 (H) 07/07/2020 0308   CREATININE 0.51 07/07/2020 0308   CALCIUM 7.2 (L) 07/07/2020 0308   GFRNONAA >60 07/07/2020 0308   GFRAA 52 (L) 02/18/2018 1610    INR    Component Value Date/Time   INR 1.0 05/03/2020 1530     Intake/Output Summary (Last 24 hours) at 07/12/2020 0739 Last data filed at 07/12/2020 0600 Gross per 24 hour  Intake 1586.08 ml  Output 1825 ml  Net -238.92 ml     Assessment/Plan:  70 y.o. female is s/p excision of infected graft material and subsequent L AKA; brought back to OR for R groin bleeding and underwent repair with muscle flap 7 Days Post-Op   RLE well perfused by doppler exam L AKA with ischemic changes; palliative consult pending Vac changes today with WOC RN Continue NG tube feedings M [place] cavitary pna-on three drug therapy and  tolerating per ID  Emilie Rutter, PA-C [facility] [phone] 07/12/2020 7:39 AM

## 2020-07-12 NOTE — Progress Notes (Signed)
Left IJ removed per MD order without difficulty.  Pt's vitals cycling while pt on bedrest.  Will continue to monitor.

## 2020-07-13 DIAGNOSIS — Z66 Do not resuscitate: Secondary | ICD-10-CM

## 2020-07-13 DIAGNOSIS — Z7189 Other specified counseling: Secondary | ICD-10-CM

## 2020-07-13 DIAGNOSIS — Z515 Encounter for palliative care: Secondary | ICD-10-CM

## 2020-07-13 LAB — GLUCOSE, CAPILLARY
Glucose-Capillary: 123 mg/dL — ABNORMAL HIGH (ref 70–99)
Glucose-Capillary: 142 mg/dL — ABNORMAL HIGH (ref 70–99)
Glucose-Capillary: 196 mg/dL — ABNORMAL HIGH (ref 70–99)
Glucose-Capillary: 202 mg/dL — ABNORMAL HIGH (ref 70–99)
Glucose-Capillary: 202 mg/dL — ABNORMAL HIGH (ref 70–99)
Glucose-Capillary: 203 mg/dL — ABNORMAL HIGH (ref 70–99)

## 2020-07-13 MED ORDER — ACETAMINOPHEN 325 MG PO TABS
650.0000 mg | ORAL_TABLET | Freq: Four times a day (QID) | ORAL | Status: DC
  Administered 2020-07-14 – 2020-07-17 (×9): 650 mg via ORAL
  Filled 2020-07-13 (×13): qty 2

## 2020-07-13 NOTE — Progress Notes (Signed)
Physical Therapy Treatment Patient Details Name: Holly Stevenson MRN: [id number] DOB: [birth year] Today's Date: 07/13/2020    History of Present Illness 70 yo admitted 12/29 with right groin dehiscence s/p debridement and graft of fem fem BPG. Additional surgeries 12/30 and 12/31 for endarterectomy and thrombolectomy with resultant LLE BPG occlusion and Rt abdominal hematoma.Bil groin VACs.  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 PMHx: HTN, DM, CKD, gout, PAD, COPD, HLD, polyneuropathy, recent bronchoscopy, 12/16-12/22 multiple vascular surgeries. requires emergent return to OR to Repair of right common femoral artery patch due to uncontrolled bleeding. 07/05/20    PT Comments    Pt continues to have many setbacks making it hard to progress with mobility. Do not think she can tolerate CIR at this time. Is LTACH and option for this patient?    Follow Up Recommendations  LTACH (if this a possibility)     Equipment Recommendations  Wheelchair (measurements PT);Wheelchair cushion (measurements PT);3in1 (PT);Hospital bed;Other (comment) (hoyer lift)    Recommendations for Other Services       Precautions / Restrictions Precautions Precautions: Fall;Other (comment) Precaution Comments: bil groin VAC, Cortrak, monitor O2, Unstageable PI to coccyx - lower sacral area.    Mobility  Bed Mobility Overal bed mobility: Needs Assistance Bed Mobility: Supine to Sit;Sit to Supine     Supine to sit: +2 for physical assistance;Mod assist Sit to supine: +2 for physical assistance;Mod assist   General bed mobility comments: Assist to bring RLE off of bed, elevate trunk into sitting, and bring hips to EOB. Assist to lower trunk and bring leg back up into bed.  Transfers                 General transfer comment: Performed two scoots laterally on side of bed with +2 mod assist  Ambulation/Gait                 Stairs             Wheelchair Mobility    Modified Rankin (Stroke  Patients Only)       Balance Overall balance assessment: Needs assistance Sitting-balance support: Feet unsupported;Bilateral upper extremity supported Sitting balance-Leahy Scale: Poor Sitting balance - Comments: UE support                                    Cognition Arousal/Alertness: Awake/alert Behavior During Therapy: WFL for tasks assessed/performed Overall Cognitive Status: Within Functional Limits for tasks assessed                                        Exercises General Exercises - Lower Extremity Long Arc Quad: AROM;Right;10 reps;Seated    General Comments        Pertinent Vitals/Pain Pain Assessment: Faces Faces Pain Scale: Hurts little more Pain Location: L residual limb Pain Descriptors / Indicators: Grimacing;Guarding;Discomfort    Home Living                      Prior Function            PT Goals (current goals can now be found in the care plan section) Acute Rehab PT Goals Patient Stated Goal: get better and go home PT Goal Formulation: With patient Time For Goal Achievement: 07/27/20 Potential to Achieve Goals: Fair Progress towards PT goals: Not  progressing toward goals - comment    Frequency    Min 2X/week      PT Plan Discharge plan needs to be updated;Frequency needs to be updated    Co-evaluation              AM-PAC PT "6 Clicks" Mobility   Outcome Measure  Help needed turning from your back to your side while in a flat bed without using bedrails?: A Lot Help needed moving from lying on your back to sitting on the side of a flat bed without using bedrails?: A Lot Help needed moving to and from a bed to a chair (including a wheelchair)?: Total Help needed standing up from a chair using your arms (e.g., wheelchair or bedside chair)?: Total Help needed to walk in hospital room?: Total Help needed climbing 3-5 steps with a railing? : Total 6 Click Score: 8    End of Session  Equipment Utilized During Treatment: Oxygen Activity Tolerance: Patient limited by fatigue Patient left: in bed;with call bell/phone within reach;with bed alarm set   PT Visit Diagnosis: Unsteadiness on feet (R26.81);Other abnormalities of gait and mobility (R26.89);Muscle weakness (generalized) (M62.81);Difficulty in walking, not elsewhere classified (R26.2);Pain Pain - Right/Left: Left Pain - part of body: Leg     Time: 7858-8502 PT Time Calculation (min) (ACUTE ONLY): 16 min  Charges:  $Therapeutic Activity: 8-22 mins                     [facility] PT Acute Rehabilitation Services Pager [phone] Office [phone]    Angelina Ok [facility] 07/13/2020, 2:25 PM

## 2020-07-13 NOTE — Progress Notes (Signed)
Pt's left leg staples removed, per order. Pt tolerated well. Incisions clean and dry.

## 2020-07-13 NOTE — Progress Notes (Addendum)
Progress Note    07/13/2020 10:12 AM 8 Days Post-Op  Subjective:  Had episode of increased WOB and tachypnea yesterday. TFs held and Lasix 40 mg given. ABG unremarkable.  Vitals:   07/13/20 0300 07/13/20 0400  BP: (!) 150/46 (!) 161/49  Pulse: 72 70  Resp:  18  Temp:  98.2 F (36.8 C)  SpO2: 91% 94%    Physical Exam: General appearance: Awake, alert in no apparent distress Cardiac: Heart rate and rhythm are regular Respirations: Non-labored. Her Sa02 has been in normal range on 5L via [place]. Incisions: Right lower leg incisions are all well approximated without bleeding or hematoma. Staples intact Bilateral groin dressings in place with good seal and moderate serous output Extremities: Left AKA and thigh staple lines are intact. Brawny edema persists. Darkened skin edges without drainage.  Right foot is warm with intact sensation and motor function.  Brisk dorsalis pedis, posterior tibial and peroneal artery Doppler signals  CBC    Component Value Date/Time   WBC 7.7 07/09/2020 0500   RBC 2.99 (L) 07/09/2020 0500   HGB 8.9 (L) 07/09/2020 0500   HCT 27.2 (L) 07/09/2020 0500   PLT 154 07/09/2020 0500   MCV 91.0 07/09/2020 0500   MCH 29.8 07/09/2020 0500   MCHC 32.7 07/09/2020 0500   RDW 16.1 (H) 07/09/2020 0500   LYMPHSABS 1.3 04/25/2020 0345   MONOABS 1.1 (H) 04/25/2020 0345   EOSABS 0.1 04/25/2020 0345   BASOSABS 0.0 04/25/2020 0345    BMET    Component Value Date/Time   NA 135 07/07/2020 0308   K 3.8 07/07/2020 0308   CL 95 (L) 07/07/2020 0308   CO2 32 07/07/2020 0308   GLUCOSE 199 (H) 07/07/2020 0308   BUN 29 (H) 07/07/2020 0308   CREATININE 0.51 07/07/2020 0308   CALCIUM 7.2 (L) 07/07/2020 0308   GFRNONAA >60 07/07/2020 0308   GFRAA 52 (L) 02/18/2018 1610     Intake/Output Summary (Last 24 hours) at 07/13/2020 1012 Last data filed at 07/13/2020 0500 Gross per 24 hour  Intake 1858.92 ml  Output 3450 ml  Net -1591.08 ml    HOSPITAL  MEDICATIONS Scheduled Meds: . acetaminophen (TYLENOL) oral liquid 160 mg/5 mL  650 mg Oral Q6H  . aspirin  81 mg Oral Daily  . atorvastatin  40 mg Oral Daily  . chlorhexidine  15 mL Mouth Rinse BID  . Chlorhexidine Gluconate Cloth  6 each Topical Daily  . cholecalciferol  1,000 Units Per Tube Daily  . collagenase   Topical Daily  . docusate  100 mg Oral BID  . enoxaparin (LOVENOX) injection  40 mg Subcutaneous Q24H  . ethambutol  1,200 mg Oral Daily  . feeding supplement (GLUCERNA SHAKE)  237 mL Oral TID BM  . feeding supplement (PROSource TF)  45 mL Per Tube TID  . insulin aspart  0-9 Units Subcutaneous Q4H  . insulin glargine  10 Units Subcutaneous BID  . isoniazid  300 mg Oral Daily  . mouth rinse  15 mL Mouth Rinse q12n4p  . OLANZapine  2.5 mg Oral QHS  . pantoprazole sodium  40 mg Oral Daily  . polyethylene glycol  17 g Oral Daily  . vitamin B-6  50 mg Oral Daily  . rifampin  600 mg Oral Daily  . sodium chloride flush  10-40 mL Intracatheter Q12H   Continuous Infusions: . sodium chloride    . feeding supplement (OSMOLITE 1.5 CAL) Stopped (07/13/20 0920)  . magnesium sulfate bolus IVPB  PRN Meds:.bisacodyl, fentaNYL (SUBLIMAZE) injection, guaiFENesin-dextromethorphan, hydrALAZINE, hydrOXYzine, ipratropium-albuterol, labetalol, magnesium sulfate bolus IVPB, metoprolol tartrate, ondansetron, oxyCODONE, phenol, sodium chloride flush  Assessment and Plan: 70 y.o. female is s/p excision of infected graft material and subsequent L AKA; brought back to OR for R groin bleeding and underwent repair with muscle flap 7 Days Post-Op   RLE well perfused by doppler exam L AKA with ischemic changes; palliative consult pending Vac changes tomorrow with WOC RN Resume NG tube feedings M [place] cavitary pna-on three drug therapy and tolerating per ID  Update labs  -DVT prophylaxis:  Lovenox   Wendi Maya, PA-C [facility] [phone] 07/13/2020  10:12 AM    VASCULAR STAFF ADDENDUM: I have independently interviewed and examined the patient. I agree with the above.  Appreciate palliative care evaluation. Patient and daughter would like to discuss plan going forward together. Continue current care plan for now.  Rande Brunt. Lenell Antu, MD [facility] Phone Number: [phone] 07/13/2020 2:10 PM

## 2020-07-13 NOTE — Progress Notes (Addendum)
Physical Therapy Treatment Patient Details Name: Holly Stevenson MRN: [id number] DOB: 1951-03-03 Today's Date: 07/13/2020    History of Present Illness 70 yo admitted 12/29 with right groin dehiscence s/p debridement and graft of fem fem BPG. Additional surgeries 12/30 and 12/31 for endarterectomy and thrombolectomy with resultant LLE BPG occlusion and Rt abdominal hematoma.Bil groin VACs.  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 PMHx: HTN, DM, CKD, gout, PAD, COPD, HLD, polyneuropathy, recent bronchoscopy, 12/16-12/22 multiple vascular surgeries. requires emergent return to OR to Repair of right common femoral artery patch due to uncontrolled bleeding. 07/05/20    PT Comments    Returned for second session to place theraband on bed rails and instruct pt on UE ex program using the theraband that she can perform on her own.   Follow Up Recommendations  LTACH (if this a possibility)     Equipment Recommendations  Wheelchair (measurements PT);Wheelchair cushion (measurements PT);3in1 (PT);Hospital bed;Other (comment)    Recommendations for Other Services       Precautions / Restrictions Precautions Precautions: Fall;Other (comment) Precaution Comments: bil groin VAC, Cortrak, monitor O2, Unstageable PI to coccyx - lower sacral area.    Mobility  Bed Mobility             Transfers                   Ambulation/Gait                 Stairs             Wheelchair Mobility    Modified Rankin (Stroke Patients Only)       Balance                                        Cognition Arousal/Alertness: Awake/alert Behavior During Therapy: WFL for tasks assessed/performed Overall Cognitive Status: Within Functional Limits for tasks assessed                                        Exercises General Exercises - Upper Extremity Shoulder Horizontal ABduction: Strengthening;Both;10 reps;Supine;Theraband Theraband Level (Shoulder  Horizontal Abduction): Level 2 (Red) (orange) Elbow Extension: Strengthening;Both;10 reps;Supine;Theraband Theraband Level (Elbow Extension): Level 2 (Red) (orange) General Exercises - Lower Extremity Long Arc Quad: AROM;Right;10 reps;Seated    General Comments        Pertinent Vitals/Pain Pain Assessment: Faces Faces Pain Scale: No hurt Pain Location: L residual limb Pain Descriptors / Indicators: Grimacing;Guarding;Discomfort    Home Living                      Prior Function            PT Goals (current goals can now be found in the care plan section) Acute Rehab PT Goals Patient Stated Goal: get better and go home PT Goal Formulation: With patient Time For Goal Achievement: 07/27/20 Potential to Achieve Goals: Fair Progress towards PT goals: Not progressing toward goals - comment    Frequency    Min 2X/week      PT Plan Current plan remains appropriate    Co-evaluation              AM-PAC PT "6 Clicks" Mobility   Outcome Measure  Help needed turning from your back to your side while  in a flat bed without using bedrails?: A Lot Help needed moving from lying on your back to sitting on the side of a flat bed without using bedrails?: A Lot Help needed moving to and from a bed to a chair (including a wheelchair)?: Total Help needed standing up from a chair using your arms (e.g., wheelchair or bedside chair)?: Total Help needed to walk in hospital room?: Total Help needed climbing 3-5 steps with a railing? : Total 6 Click Score: 8    End of Session Equipment Utilized During Treatment: Oxygen Activity Tolerance: Patient tolerated treatment well Patient left: in bed;with call bell/phone within reach;with bed alarm set   PT Visit Diagnosis: Unsteadiness on feet (R26.81);Other abnormalities of gait and mobility (R26.89);Muscle weakness (generalized) (M62.81);Difficulty in walking, not elsewhere classified (R26.2);Pain Pain - Right/Left: Left Pain -  part of body: Leg     Time: 5364-6803 PT Time Calculation (min) (ACUTE ONLY): 9 min  Charges:  $Therapeutic Exercise: 8-22 mins $Therapeutic Activity: 8-22 mins                     [facility] PT Acute Rehabilitation Services Pager [phone] Office [phone]    Angelina Ok [facility] 07/13/2020, 4:46 PM

## 2020-07-13 NOTE — Progress Notes (Signed)
Inpatient Rehab Admissions Coordinator:   Note Palliative consult today.  No firm decisions at this time, but pt does not appear to be able to tolerate post-acute rehab in an inpatient setting at this time.  Therapy updating recommendations today to [facility].  Pt also still with significant medical needs that cannot be met on CIR.  At this time we will sign off.  If pt's medical status improves and she is stable enough to participate in 3 hours of therapy daily, please feel free to contact me and I will re-assess.    Shann Medal, PT, DPT Admissions Coordinator [phone] 07/13/20  2:01 PM

## 2020-07-14 LAB — CBC WITH DIFFERENTIAL/PLATELET
Abs Immature Granulocytes: 0.04 10*3/uL (ref 0.00–0.07)
Basophils Absolute: 0 10*3/uL (ref 0.0–0.1)
Basophils Relative: 0 %
Eosinophils Absolute: 0.2 10*3/uL (ref 0.0–0.5)
Eosinophils Relative: 3 %
HCT: 31.4 % — ABNORMAL LOW (ref 36.0–46.0)
Hemoglobin: 9.9 g/dL — ABNORMAL LOW (ref 12.0–15.0)
Immature Granulocytes: 1 %
Lymphocytes Relative: 14 %
Lymphs Abs: 0.9 10*3/uL (ref 0.7–4.0)
MCH: 29.2 pg (ref 26.0–34.0)
MCHC: 31.5 g/dL (ref 30.0–36.0)
MCV: 92.6 fL (ref 80.0–100.0)
Monocytes Absolute: 0.5 10*3/uL (ref 0.1–1.0)
Monocytes Relative: 8 %
Neutro Abs: 4.7 10*3/uL (ref 1.7–7.7)
Neutrophils Relative %: 74 %
Platelets: 290 10*3/uL (ref 150–400)
RBC: 3.39 MIL/uL — ABNORMAL LOW (ref 3.87–5.11)
RDW: 16.4 % — ABNORMAL HIGH (ref 11.5–15.5)
WBC: 6.5 10*3/uL (ref 4.0–10.5)
nRBC: 0 % (ref 0.0–0.2)

## 2020-07-14 LAB — GLUCOSE, CAPILLARY
Glucose-Capillary: 191 mg/dL — ABNORMAL HIGH (ref 70–99)
Glucose-Capillary: 223 mg/dL — ABNORMAL HIGH (ref 70–99)
Glucose-Capillary: 224 mg/dL — ABNORMAL HIGH (ref 70–99)
Glucose-Capillary: 109 mg/dL — ABNORMAL HIGH (ref 70–99)
Glucose-Capillary: 137 mg/dL — ABNORMAL HIGH (ref 70–99)

## 2020-07-14 LAB — BASIC METABOLIC PANEL
Anion gap: 9 (ref 5–15)
BUN: 26 mg/dL — ABNORMAL HIGH (ref 8–23)
CO2: 28 mmol/L (ref 22–32)
Calcium: 7.7 mg/dL — ABNORMAL LOW (ref 8.9–10.3)
Chloride: 98 mmol/L (ref 98–111)
Creatinine, Ser: 0.48 mg/dL (ref 0.44–1.00)
GFR, Estimated: >60 mL/min (ref >60)
Glucose, Bld: 183 mg/dL — ABNORMAL HIGH (ref 70–99)
Potassium: 4.3 mmol/L (ref 3.5–5.1)
Sodium: 135 mmol/L (ref 135–145)

## 2020-07-14 NOTE — Progress Notes (Addendum)
   Daily Progress Note   Patient Name: Holly Stevenson       Date: 07/14/2020 DOB: 18-Sep-1950  Age: 70 y.o. MRN#: [id number] Attending Physician: Leonie Douglas, MD Primary Care Physician: Maurice Small, MD Admit Date: 06/14/2020  Reason for Consultation/Follow-up: Establishing goals of care  Subjective: Chart reviewed. Per PT evaluation recommendations are for LTACH.   I spoke with Holly Stevenson to continue with goals of care discussions. She is requesting to meet with her daughter also, who unfortunately is no longer at the hospital. She was visiting earlier this morning but had to leave for work.   I spoke with daughter, Holly Stevenson (via phone) as requested by patient. I introduced Palliative and our role in her mother's care. Daughter expressed she was not eager to speak with [place] but did agree to meet sometime over the weekend. She is aware I am not available but will reach out to one of my colleagues to meet with her sometime this weekend for continued goals of care.   As requested, my colleague Holly Duster, NP will plan to meet daughter at bedside tomorrow (07/15/20) @ 2:00pm. Daughter has confirmed.   All questions answered and support provided.   Length of Stay: 30 days  Vital Signs: BP (!) 172/53 (BP Location: Left Arm)   Pulse 71   Temp 98.4 F (36.9 C) (Oral)   Resp 16   Ht 5\' 8"  (1.727 m)   Wt 81.1 kg   SpO2 99%   BMI 27.19 kg/m  SpO2: SpO2: 99 % O2 Device: O2 Device: Nasal Cannula O2 Flow Rate: O2 Flow Rate (L/min): 4 L/min  Palliative Care Assessment & Plan   Goals of Care/Recommendations: DNR Continue to treat the treatable Patient requesting to meet with daughter at the bedside and continue goals of care discussion. Spoke with daughter via phone who expressed she would be available over the weekend.  Family meeting 1/29 @ 2pm  PMT will continue to support and follow.    Thank you for allowing the Palliative Medicine Team to assist in the care of this  patient.  Time Total: 20 min.   Visit consisted of counseling and education dealing with the complex and emotionally intense issues of symptom management and palliative care in the setting of serious and potentially life-threatening illness.Greater than 50%  of this time was spent counseling and coordinating care related to the above assessment and plan.  2/29, AGPCNP-BC  Palliative Medicine Team [phone]

## 2020-07-14 NOTE — Progress Notes (Addendum)
Progress Note    07/14/2020 7:08 AM 9 Days Post-Op  Subjective:  Drowsy. Daughter at bedside.   Vitals:   07/14/20 0300 07/14/20 0500  BP: (!) 171/55 (!) 154/50  Pulse: 77 68  Resp: 18 14  Temp: 98.2 F (36.8 C)   SpO2: 97% 98%    Physical Exam: General appearance: Awake easily, alert in no apparent distress Cardiac: Heart rate and rhythm are regular Respirations: Expiratory wheezes. Mildly tachypnic. Her Sa02 has been in normal range on 4L via [place]. Incisions: Right lower leg incisions are all well approximated; nearly healed. Staples out. Bilateral groin dressings in place with good seal and moderate serous output Extremities: Left AKA and thigh staple lines are intact. Brawny edema persists. Darkened skin edges without drainage.  Right foot is warm with intact sensation and motor function.  Brisk dorsalis pedis, posterior tibial and peroneal artery Doppler signals    CBC    Component Value Date/Time   WBC 6.5 07/14/2020 0225   RBC 3.39 (L) 07/14/2020 0225   HGB 9.9 (L) 07/14/2020 0225   HCT 31.4 (L) 07/14/2020 0225   PLT 290 07/14/2020 0225   MCV 92.6 07/14/2020 0225   MCH 29.2 07/14/2020 0225   MCHC 31.5 07/14/2020 0225   RDW 16.4 (H) 07/14/2020 0225   LYMPHSABS 0.9 07/14/2020 0225   MONOABS 0.5 07/14/2020 0225   EOSABS 0.2 07/14/2020 0225   BASOSABS 0.0 07/14/2020 0225    BMET    Component Value Date/Time   NA 135 07/14/2020 0225   K 4.3 07/14/2020 0225   CL 98 07/14/2020 0225   CO2 28 07/14/2020 0225   GLUCOSE 183 (H) 07/14/2020 0225   BUN 26 (H) 07/14/2020 0225   CREATININE 0.48 07/14/2020 0225   CALCIUM 7.7 (L) 07/14/2020 0225   GFRNONAA >60 07/14/2020 0225   GFRAA 52 (L) 02/18/2018 1610     Intake/Output Summary (Last 24 hours) at 07/14/2020 0708 Last data filed at 07/14/2020 0400 Gross per 24 hour  Intake 1439.83 ml  Output 1050 ml  Net 389.83 ml    HOSPITAL MEDICATIONS Scheduled Meds: . acetaminophen  650 mg Oral Q6H  . aspirin  81  mg Oral Daily  . atorvastatin  40 mg Oral Daily  . chlorhexidine  15 mL Mouth Rinse BID  . Chlorhexidine Gluconate Cloth  6 each Topical Daily  . cholecalciferol  1,000 Units Per Tube Daily  . collagenase   Topical Daily  . docusate  100 mg Oral BID  . enoxaparin (LOVENOX) injection  40 mg Subcutaneous Q24H  . ethambutol  1,200 mg Oral Daily  . feeding supplement (GLUCERNA SHAKE)  237 mL Oral TID BM  . feeding supplement (PROSource TF)  45 mL Per Tube TID  . insulin aspart  0-9 Units Subcutaneous Q4H  . insulin glargine  10 Units Subcutaneous BID  . isoniazid  300 mg Oral Daily  . mouth rinse  15 mL Mouth Rinse q12n4p  . OLANZapine  2.5 mg Oral QHS  . pantoprazole sodium  40 mg Oral Daily  . polyethylene glycol  17 g Oral Daily  . vitamin B-6  50 mg Oral Daily  . rifampin  600 mg Oral Daily  . sodium chloride flush  10-40 mL Intracatheter Q12H   Continuous Infusions: . sodium chloride    . feeding supplement (OSMOLITE 1.5 CAL) 1,000 mL (07/14/20 0600)  . magnesium sulfate bolus IVPB     PRN Meds:.bisacodyl, fentaNYL (SUBLIMAZE) injection, guaiFENesin-dextromethorphan, hydrALAZINE, hydrOXYzine, ipratropium-albuterol, labetalol, magnesium sulfate  bolus IVPB, metoprolol tartrate, ondansetron, oxyCODONE, phenol, sodium chloride flush  Assessment and Plan: 70 y.o.femaleis s/p excision of infected graft material and subsequent L AKA; brought back to OR for R groin bleeding and underwent repair with muscle flap8 Days Post-Op   RLE well perfused by doppler exam L AKA with ischemic changes and stable in appearance; no leukocytosis or fever Anemia: multifactorial. Hgb improving Palliative consult in place Vac changes today with WOC RN NG tube feedings continue M [place] cavitary pna-on three drug therapy and tolerating per ID PT rec: LTACH. She does not qualify for CIR   -DVT prophylaxis:  Lovenox   Wendi Maya, PA-C [facility] [phone] 07/14/2020   7:08 AM   VASCULAR STAFF ADDENDUM: I have independently interviewed and examined the patient. I agree with the above.  Family meeting with palliative care over the weekend. Continue current care plan until goals of care delineated.   Rande Brunt. Lenell Antu, MD [facility] Phone Number: [phone] 07/14/2020 1:52 PM

## 2020-07-14 NOTE — Progress Notes (Signed)
Occupational Therapy Treatment Patient Details Name: Holly Stevenson MRN: [id number] DOB: May 27, 1951 Today's Date: 07/14/2020    History of present illness 70 yo admitted 12/29 with right groin dehiscence s/p debridement and graft of fem fem BPG. Additional surgeries 12/30 and 12/31 for endarterectomy and thrombolectomy with resultant LLE BPG occlusion and Rt abdominal hematoma.Bil groin VACs.  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 PMHx: HTN, DM, CKD, gout, PAD, COPD, HLD, polyneuropathy, recent bronchoscopy, 12/16-12/22 multiple vascular surgeries. requires emergent return to OR to Repair of right common femoral artery patch due to uncontrolled bleeding. 07/05/20   OT comments  Pt progressing slowly towards acute OT goals. Easily agreeable to work with therapy. Able to sit EOB several minutes with BUE support (both hands on mattress) at min guard level. Able to scoot towards HOB at min guard level. Pt also able to complete most of scooting up in bed task by pushing through mattress with R foot and holding onto head rail. +2 mod A for supine<>EOB.  D/c plan update to venue below.    Follow Up Recommendations  SNF;LTACH;Supervision/Assistance - 24 hour    Equipment Recommendations  3 in 1 bedside commode;Tub/shower bench;Wheelchair (measurements OT);Wheelchair cushion (measurements OT)    Recommendations for Other Services      Precautions / Restrictions Precautions Precautions: Fall;Other (comment) Precaution Comments: bil groin VAC, Cortrak, monitor O2, Unstageable PI to coccyx - lower sacral area. Restrictions Weight Bearing Restrictions: Yes LLE Weight Bearing: Non weight bearing       Mobility Bed Mobility Overal bed mobility: Needs Assistance Bed Mobility: Supine to Sit;Sit to Supine     Supine to sit: Mod assist;+2 for physical assistance;HOB elevated Sit to supine: Mod assist;+2 for physical assistance   General bed mobility comments: Assist to powerup trunk and pivot hips to full  EOB position. Able to hold onto headboard and use RLE to scoot up in the bed at min A+2 mostly for linen management  Transfers                 General transfer comment: Trialed 1x initiating stand from elevated bed height. Unsuccessful this date. Discussed bed level exercises to work on to build up to standing    Balance Overall balance assessment: Needs assistance Sitting-balance support: Feet unsupported;Bilateral upper extremity supported Sitting balance-Leahy Scale: Poor Sitting balance - Comments: UE support                                   ADL either performed or assessed with clinical judgement   ADL Overall ADL's : Needs assistance/impaired     Grooming: Min guard;Sitting                                 General ADL Comments: Pt completed bed mobiltiy sat EOB several minutes then completed lateral scoots towards head of bed.     Vision       Perception     Praxis      Cognition Arousal/Alertness: Awake/alert Behavior During Therapy: WFL for tasks assessed/performed Overall Cognitive Status: Within Functional Limits for tasks assessed                                          Exercises     Shoulder  Instructions       General Comments      Pertinent Vitals/ Pain       Pain Assessment: Faces Faces Pain Scale: Hurts a little bit Pain Location: L residual limb Pain Descriptors / Indicators: Guarding Pain Intervention(s): Monitored during session;Repositioned  Home Living                                          Prior Functioning/Environment              Frequency  Min 2X/week        Progress Toward Goals  OT Goals(current goals can now be found in the care plan section)  Progress towards OT goals: Progressing toward goals (slowly)  Acute Rehab OT Goals Patient Stated Goal: get better and go home OT Goal Formulation: With patient Time For Goal Achievement:  07/18/20 Potential to Achieve Goals: Good ADL Goals Pt Will Perform Grooming: with modified independence;sitting Pt Will Perform Upper Body Bathing: with set-up;sitting Pt Will Perform Lower Body Bathing: with min assist;sitting/lateral leans Pt Will Perform Upper Body Dressing: with set-up;sitting Pt Will Perform Lower Body Dressing: with mod assist;sitting/lateral leans Pt Will Transfer to Toilet: with mod assist;squat pivot transfer;with transfer board;bedside commode Pt Will Perform Toileting - Clothing Manipulation and hygiene: with min assist;sitting/lateral leans Pt/caregiver will Perform Home Exercise Program: Increased strength;Both right and left upper extremity;With theraband;With written HEP provided;Independently Additional ADL Goal #1: Pt to demonstrate ability to sit EOB >5-7 minutes with no physical support needed for balance to improve ADL participation  Plan Frequency remains appropriate;Discharge plan needs to be updated    Co-evaluation                 AM-PAC OT "6 Clicks" Daily Activity     Outcome Measure   Help from another person eating meals?: None Help from another person taking care of personal grooming?: A Little Help from another person toileting, which includes using toliet, bedpan, or urinal?: Total Help from another person bathing (including washing, rinsing, drying)?: A Lot Help from another person to put on and taking off regular upper body clothing?: A Little Help from another person to put on and taking off regular lower body clothing?: A Lot 6 Click Score: 15    End of Session Equipment Utilized During Treatment: Oxygen;Rolling walker;Gait belt  OT Visit Diagnosis: Muscle weakness (generalized) (M62.81);Pain;Other abnormalities of gait and mobility (R26.89) Pain - Right/Left: Left Pain - part of body: Leg   Activity Tolerance Patient tolerated treatment well   Patient Left in bed;with call bell/phone within reach;with bed alarm set    Nurse Communication          Time: 5284-1324 OT Time Calculation (min): 26 min  Charges: OT General Charges $OT Visit: 1 Visit OT Treatments $Self Care/Home Management : 8-22 mins $Therapeutic Activity: 8-22 mins  Raynald Kemp, OT Acute Rehabilitation Services Pager: [phone] Office: [phone]    Pilar Grammes 07/14/2020, 1:55 PM

## 2020-07-14 NOTE — Consult Note (Signed)
WOC Nurse wound follow up Per the Secure Chat message from PA, Kela Millin, Surgeon wants groin VACs replaced. One piece of black foam placed into left groin wound; 2 pieces of black foam placed into right groin. Immediate seal obtained. Patient tolerated well. Supplies in room for Monday.  Helmut Muster, RN, MSN, CWOCN, CNS-BC, pager [phone]

## 2020-07-14 NOTE — Consult Note (Signed)
WOC Nurse wound follow up Patient receiving care in [facility] 4E10. Daughter, Melissa at bedside. I was able to connect with Wendi Maya, PA, and she is in agreement that I would remove bilateral groin VACs and place saline moistened gauze into wounds.  Someone from surgical services will come later today and assess the wounds.  This is exactly what I did while seeing the patient today.  Patient and Efraim Kaufmann understand the reason for my actions. Helmut Muster, RN, MSN, CWOCN, CNS-BC, pager [phone]

## 2020-07-14 NOTE — Progress Notes (Signed)
Bipap not needed at this time. Pt resting comfortably. Bipap on standby.

## 2020-07-14 NOTE — Progress Notes (Signed)
Patient refused use of BIPAP for the evening.  °

## 2020-07-15 LAB — GLUCOSE, CAPILLARY
Glucose-Capillary: 147 mg/dL — ABNORMAL HIGH (ref 70–99)
Glucose-Capillary: 158 mg/dL — ABNORMAL HIGH (ref 70–99)
Glucose-Capillary: 167 mg/dL — ABNORMAL HIGH (ref 70–99)
Glucose-Capillary: 182 mg/dL — ABNORMAL HIGH (ref 70–99)
Glucose-Capillary: 185 mg/dL — ABNORMAL HIGH (ref 70–99)
Glucose-Capillary: 190 mg/dL — ABNORMAL HIGH (ref 70–99)
Glucose-Capillary: 200 mg/dL — ABNORMAL HIGH (ref 70–99)

## 2020-07-15 MED ORDER — SALINE SPRAY 0.65 % NA SOLN
1.0000 | NASAL | Status: DC | PRN
  Administered 2020-07-15: 1 via NASAL
  Filled 2020-07-15: qty 44

## 2020-07-15 NOTE — TOC Transition Note (Addendum)
Transition of Care [facility]) - CM/SW Discharge Note   Patient Details  Name: Morgaine Kimball MRN: [id number] Date of Birth: 01-20-1951  [facility]) CM/SW Contact:  Luvenia Redden, RN Phone Number:  [phone] 07/15/2020, 2:45 PM   Clinical Narrative:    Spoke with daughter Bentli Llorente concerning choice for hospice services in the residential area. RNCM provided both [facility] and [address] both in [place]. States pt will need a lot of DME prior to discharge. Explained the agency of choice will arrange delivery for the requested DME prior to pt's discharge if possible. Daughter requested to review information about the agencies prior to making a decision for services. Informed the daughter Efraim Kaufmann) that agency can complete an educational call about the services they offer and she would be able to make inquires if she felt better with a face to face. Daughter verbalized an understanding and will update TOC once she decides on an agency. Pt will discharge to her daughter  Daquana Paddock) home address ([address] [place], [place] [address]).   TOC remains available to address ongoing discharge needs.   Addendum 3:15 PM -received message daughter has requested [facility] for home with hospice. RN made a referral to [facility] (hospice liaison)        Patient Goals and CMS Choice        Discharge Placement                       Discharge Plan and Services                                     Social Determinants of Health (SDOH) Interventions     Readmission Risk Interventions Readmission Risk Prevention Plan 06/07/2020  Transportation Screening Complete  PCP or Specialist Appt within 5-7 Days Complete  Home Care Screening Complete  Medication Review (RN CM) Complete  Some recent data might be hidden

## 2020-07-15 NOTE — Progress Notes (Signed)
[facility]   Referral received for hospice services at home once discharged.  Spoke with Efraim Kaufmann and Ms. Wardlow over the phone, provided support and answered questions.  Holly Stevenson plans on bringing her mother home with her to: [address], [phone].  Ms. Giampietro will need ambulance transport home.  DME discussed, pt will need hospital bed, O2, wheelchair, and shower chair--[facility] will order. It cannot be delivered until likely Monday, Efraim Kaufmann has family coming in town tomorrow to help clear a space for the DME.  Thank you for this referral, Wallis Bamberg RN, BSN, CCRN [facility] Liaison

## 2020-07-15 NOTE — Progress Notes (Addendum)
  Progress Note    07/15/2020 6:33 AM 10 Days Post-Op  Subjective:  Says she's tired.  Says she has some pain in the left AKA  Afebrile HR 60's-80's NSR 150's-170's systolic 99% 4LO2NC  Vitals:   07/15/20 0018 07/15/20 0447  BP: (!) 166/53 (!) 179/60  Pulse: 69 74  Resp: 16 18  Temp: 98.4 F (36.9 C) 98.5 F (36.9 C)  SpO2: 99% 99%    Physical Exam: General:  Sleeping wakes easily Cardiac:  Regular Lungs:  Non labored on 4LO2 Incisions:  Right leg incisions healed; right groin wound vac in place with good seal.  Left AKA with some ischemic changes.     CBC    Component Value Date/Time   WBC 6.5 07/14/2020 0225   RBC 3.39 (L) 07/14/2020 0225   HGB 9.9 (L) 07/14/2020 0225   HCT 31.4 (L) 07/14/2020 0225   PLT 290 07/14/2020 0225   MCV 92.6 07/14/2020 0225   MCH 29.2 07/14/2020 0225   MCHC 31.5 07/14/2020 0225   RDW 16.4 (H) 07/14/2020 0225   LYMPHSABS 0.9 07/14/2020 0225   MONOABS 0.5 07/14/2020 0225   EOSABS 0.2 07/14/2020 0225   BASOSABS 0.0 07/14/2020 0225    BMET    Component Value Date/Time   NA 135 07/14/2020 0225   K 4.3 07/14/2020 0225   CL 98 07/14/2020 0225   CO2 28 07/14/2020 0225   GLUCOSE 183 (H) 07/14/2020 0225   BUN 26 (H) 07/14/2020 0225   CREATININE 0.48 07/14/2020 0225   CALCIUM 7.7 (L) 07/14/2020 0225   GFRNONAA >60 07/14/2020 0225   GFRAA 52 (L) 02/18/2018 1610    INR    Component Value Date/Time   INR 1.0 05/03/2020 1530     Intake/Output Summary (Last 24 hours) at 07/15/2020 8295 Last data filed at 07/15/2020 0503 Gross per 24 hour  Intake 605 ml  Output 1640 ml  Net -1035 ml     Assessment:  70 y.o. 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  No fever or leukocytosis. -continue TF-pt's albumin 1.4  -right groin wound vac  changes with WOC -M [place] cavitary pna-on three drug therapy and tolerating per ID -family meeting with palliative care today to continue talks about goals of care.  Appreciate their assistance.  -DVT prophylaxis:  Lovenox -PT recommending SNF or LTACH. Does not meet qualifications for CIR   Doreatha Massed, [place] [facility] [phone] 07/15/2020 6:33 AM  VASCULAR STAFF ADDENDUM: I have independently interviewed and examined the patient. I agree with the above.  Family meeting today at 14:00 with palliative care. Limited options reviewed in detail with patient and daughter yesterday.  Rande Brunt. Lenell Antu, MD [facility] of [facility] Phone Number: [phone] 07/15/2020 10:15 AM

## 2020-07-15 NOTE — Progress Notes (Signed)
Palliative Medicine Inpatient Follow Up Note  HPI:  Palliative Care consult requested for goals of care discussion in this 70 y.o. female with multiple medical problems including hypertension, diabetes, hyperlipidemia, and most recently on 05/23/20 w/p right iliofemoral endarterectomy and angioplasty with R to L femorofemoral bypass and L femoral to AK popliteal bypass. Patient ws seen by Vascular team outpatient with concerns for LLE numbness and increased pain in left foot. During their evaluation left foot cold to touch with inability to find doppler signal. LLE ischemic. 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   Today's Discussion (07/15/2020):  *Please note that this is a verbal dictation therefore any spelling or grammatical errors are due to the "[employer]agon Medical One" system interpretation.  Chart reviewed.  Family meeting held at [facility] with Dr. Lenell Antu, Holly Stevenson (daughter), Holly Stevenson (patient), and myself.  I introduced Palliative Medicine as specialized medical care for people living with serious illness. It focuses on providing relief from the symptoms and stress of a serious illness. The goal is to improve quality of life for both the patient and the family. Patient verbalized understanding.   I asked Holly Stevenson to share with me what she understands about her present situation. She states that "there is nothing else that can be done" and that we are at a point where hospice is to be considered. Dr. Lenell Antu provided insights that at this point there is nothing further that can be pursued from a vascular surgery perspective. He shared that we are very much looking at hospice care.  I described hospice as a service for patients for have a life expectancy of < 6 months. It preserves dignity and quality at the end phases of life. The focus changes from curative to symptom relief. We discussed that certain things like NGT feeding and wound vacuum changes are not  done on hospice care. We reviewed that symptoms which are aggressively managed include, pain, anxiety, dyspnea, insomnia, nausea, itching etc.   Patients most imperative goal is to be home. She will be going to live with her daughter, Holly Stevenson who confirms that there will be 24/7 care for her mother. She expresses some fears about the "what if's" which we worked through. I shared that this is very difficult for both patients and their families which is one of the beauties of hospice. They acknowledged understanding.   Holly Stevenson shares that she is in agreement with the pursuit of hospice and asks when we can get this process moving. She states that she wants to get out of the hospital. Dr. Lenell Antu and I assured her that this is the universal goal.  Questions and concerns addressed to patient and daughters satisfaction.  Updated patients bedside RN, Lauren and Case Manager, Misty Stanley on the plan.   Objective Assessment: Vital Signs Vitals:   07/15/20 1203 07/15/20 1230  BP: (!) 172/56 (!) 154/63  Pulse: 71 80  Resp: 17   Temp: 98.3 F (36.8 C)   SpO2: 100% 99%    Intake/Output Summary (Last 24 hours) at 07/15/2020 1436 Last data filed at 07/15/2020 0503 Gross per 24 hour  Intake 605 ml  Output 1640 ml  Net -1035 ml   Last Weight  Most recent update: 07/07/2020  3:03 AM   Weight  81.1 kg (178 lb 12.7 oz)           Gen:  Older caucasian F HEENT: Coretrack in place, moist mucous membranes CV: Regular rate and rhythm  PULM: 4L  [place] ABD: soft/nontender  EXT: (+) generalized edema, (+) R groin wound vac, (+) L AKA w/ staples Neuro: Alert and oriented x4  SUMMARY OF RECOMMENDATIONS   DNAR/DNI, no long term artificial feedings  Gold DNR placed on chart  TOC - Plan for patient to discharge home with hospice. Appreciate help with home hospice arrangements. Patient lives in [place] and has no pre-selected hospice of choice.   Discussed with Dr. Lenell Antu  Time In: 1400 Time Out:  1449 Time Spent: 49 Greater than 50% of the time was spent in counseling and coordination of care ______________________________________________________________________________________ Lamarr Lulas [facility] Palliative Medicine Team Team Cell Phone: [phone] Please utilize secure chat with additional questions, if there is no response within 30 minutes please call the above phone number  Palliative Medicine Team providers are available by phone from 7am to 7pm daily and can be reached through the team cell phone.  Should this patient require assistance outside of these hours, please call the patient's attending physician.

## 2020-07-16 LAB — GLUCOSE, CAPILLARY
Glucose-Capillary: 125 mg/dL — ABNORMAL HIGH (ref 70–99)
Glucose-Capillary: 149 mg/dL — ABNORMAL HIGH (ref 70–99)
Glucose-Capillary: 177 mg/dL — ABNORMAL HIGH (ref 70–99)
Glucose-Capillary: 72 mg/dL (ref 70–99)
Glucose-Capillary: 93 mg/dL (ref 70–99)

## 2020-07-16 MED ORDER — MORPHINE SULFATE (PF) 2 MG/ML IV SOLN
2.0000 mg | INTRAVENOUS | Status: DC | PRN
  Administered 2020-07-16 – 2020-07-17 (×2): 2 mg via INTRAVENOUS
  Filled 2020-07-16: qty 1

## 2020-07-16 MED ORDER — MORPHINE SULFATE (PF) 4 MG/ML IV SOLN: 4.0000 mg | INTRAVENOUS | Status: DC | PRN

## 2020-07-16 MED ORDER — MORPHINE SULFATE (PF) 2 MG/ML IV SOLN
2.0000 mg | INTRAVENOUS | Status: DC | PRN
  Filled 2020-07-16: qty 1

## 2020-07-16 NOTE — Progress Notes (Addendum)
  Progress Note    07/16/2020 8:53 AM 11 Days Post-Op  Subjective:  Sleeping and I did not wake her.  Afebrile HR 60's-70's NSR 170's systolic 93-100% RA  Vitals:   07/15/20 2333 07/16/20 0418  BP: (!) 174/54 (!) 178/54  Pulse: 73 73  Resp: 16 17  Temp: 97.9 F (36.6 C)   SpO2: 100% 100%    Physical Exam: General:  Resting comfortably Lungs:  Non labored   CBC    Component Value Date/Time   WBC 6.5 07/14/2020 0225   RBC 3.39 (L) 07/14/2020 0225   HGB 9.9 (L) 07/14/2020 0225   HCT 31.4 (L) 07/14/2020 0225   PLT 290 07/14/2020 0225   MCV 92.6 07/14/2020 0225   MCH 29.2 07/14/2020 0225   MCHC 31.5 07/14/2020 0225   RDW 16.4 (H) 07/14/2020 0225   LYMPHSABS 0.9 07/14/2020 0225   MONOABS 0.5 07/14/2020 0225   EOSABS 0.2 07/14/2020 0225   BASOSABS 0.0 07/14/2020 0225    BMET    Component Value Date/Time   NA 135 07/14/2020 0225   K 4.3 07/14/2020 0225   CL 98 07/14/2020 0225   CO2 28 07/14/2020 0225   GLUCOSE 183 (H) 07/14/2020 0225   BUN 26 (H) 07/14/2020 0225   CREATININE 0.48 07/14/2020 0225   CALCIUM 7.7 (L) 07/14/2020 0225   GFRNONAA >60 07/14/2020 0225   GFRAA 52 (L) 02/18/2018 1610    INR    Component Value Date/Time   INR 1.0 05/03/2020 1530     Intake/Output Summary (Last 24 hours) at 07/16/2020 0853 Last data filed at 07/16/2020 0707 Gross per 24 hour  Intake 0 ml  Output 710 ml  Net -710 ml     Assessment:  70 y.o. 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 -palliative meeting with pt and her daughter yesterday.  Pt going home with her daughter with hospice.  Given that the TF are off, can probably discontinue core track.  TOC has been consulted and equipment and hospice are being arranged.  She will need hospital bed, O2, wheelchair, and  shower chair and these most likely cannot be delivered until tomorrow.     Doreatha Massed, PA-C [facility] [phone] 07/16/2020 8:53 AM  VASCULAR STAFF ADDENDUM: I have independently interviewed and examined the patient. I agree with the above.  Transition to comfort measures only per discussion yesterday.  Remove NGT. Remove VAC tomorrow. Transition to wet-to-dry.  Rande Brunt. Lenell Antu, MD [facility] of [facility] Phone Number: [phone] 07/16/2020 11:34 AM  ]

## 2020-07-16 NOTE — Progress Notes (Signed)
[facility])   Referral received for hospice services at home once discharged. Plan is to discharge Monday or Tuesday depending on when DME can be delivered. DME discussed, pt will need hospital bed, O2, wheelchair, and shower chair-has been ordered and will be delivered possibly Monday once room cleared.  Melissa plans on bringing her mother home with her to: [address], [phone].  Ms. Hodges will need PTAR for transport home.  At discharge, please be sure to send comfort medications. If any questions please call.  Thank you, Yolande Jolly, BSN, [facility] [phone]

## 2020-07-16 NOTE — Progress Notes (Signed)
Pt sated she doesn't want to wear Bipap for he night.

## 2020-07-16 NOTE — Progress Notes (Addendum)
   Palliative Medicine Inpatient Follow Up Note  HPI:  Palliative Care consult requested for goals of care discussion in this 70 y.o. female with multiple medical problems including hypertension, diabetes, hyperlipidemia, and most recently on 05/23/20 w/p right iliofemoral endarterectomy and angioplasty with R to L femorofemoral bypass and L femoral to AK popliteal bypass. Patient ws seen by Vascular team outpatient with concerns for LLE numbness and increased pain in left foot. During their evaluation left foot cold to touch with inability to find doppler signal. LLE ischemic. 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   Today's Discussion (07/16/2020):  *Please note that this is a verbal dictation therefore any spelling or grammatical errors are due to the "[place] [employer]" system interpretation.  Chart reviewed.  I met with Holly Stevenson at bedside this morning. She was noted to be resting comfortably in no distress.  I reached out to the MSW and [facility] team to follow up on the discharge plan.  Per Melissa of [facility] there should be delivery of DME as early as today. Plan will be for discharge tomorrow.  Objective Assessment: Vital Signs Vitals:   07/15/20 2333 07/16/20 0418  BP: (!) 174/54 (!) 178/54  Pulse: 73 73  Resp: 16 17  Temp: 97.9 F (36.6 C)   SpO2: 100% 100%    Intake/Output Summary (Last 24 hours) at 07/16/2020 0102 Last data filed at 07/16/2020 7253 Gross per 24 hour  Intake 0 ml  Output 710 ml  Net -710 ml   Last Weight  Most recent update: 07/07/2020  3:03 AM   Weight  81.1 kg (178 lb 12.7 oz)           Gen:  Older caucasian F HEENT: Coretrack in place, moist mucous membranes CV: Regular rate and rhythm  PULM: 4L [place] ABD: soft/nontender  EXT: (+) generalized edema, (+) R groin wound vac, (+) L AKA w/ staples Neuro: Alert and oriented x4  SUMMARY OF RECOMMENDATIONS   DNAR/DNI, no long term artificial  feedings  Gold DNR placed on chart  Plan for patient to transition home with [facility] hospice tomorrow  Please note under hospice care patient will not be able to discharge home with wound vacuum or coretrack - these will need to be removed prior to discharge  Time Spent: 15 Greater than 50% of the time was spent in counseling and coordination of care ______________________________________________________________________________________ [place] Team Team Cell Phone: [phone] Please utilize secure chat with additional questions, if there is no response within 30 minutes please call the above phone number  Palliative Medicine Team providers are available by phone from 7am to 7pm daily and can be reached through the team cell phone.  Should this patient require assistance outside of these hours, please call the patient's attending physician.

## 2020-07-17 LAB — GLUCOSE, CAPILLARY
Glucose-Capillary: 72 mg/dL (ref 70–99)
Glucose-Capillary: 75 mg/dL (ref 70–99)
Glucose-Capillary: 146 mg/dL — ABNORMAL HIGH (ref 70–99)
Glucose-Capillary: 133 mg/dL — ABNORMAL HIGH (ref 70–99)
Glucose-Capillary: 134 mg/dL — ABNORMAL HIGH (ref 70–99)
Glucose-Capillary: 73 mg/dL (ref 70–99)

## 2020-07-17 MED ORDER — OLANZAPINE 2.5 MG PO TABS: 2.5000 mg | ORAL_TABLET | Freq: Every day | ORAL | 0 refills | Status: AC

## 2020-07-17 MED ORDER — BISACODYL 10 MG RE SUPP: 10.0000 mg | Freq: Every day | RECTAL | 0 refills | Status: AC | PRN

## 2020-07-17 MED ORDER — OLANZAPINE 2.5 MG PO TABS: 2.5000 mg | ORAL_TABLET | Freq: Every day | ORAL | 0 refills | Status: DC

## 2020-07-17 MED ORDER — HYPROMELLOSE (GONIOSCOPIC) 2.5 % OP SOLN: 1.0000 [drp] | Freq: Three times a day (TID) | OPHTHALMIC | 12 refills | Status: DC | PRN

## 2020-07-17 MED ORDER — HYPROMELLOSE (GONIOSCOPIC) 2.5 % OP SOLN: 1.0000 [drp] | Freq: Three times a day (TID) | OPHTHALMIC | 2 refills | Status: AC | PRN

## 2020-07-17 MED ORDER — MORPHINE SULFATE 10 MG/5ML PO SOLN: 5.0000 mg | ORAL | 0 refills | Status: DC | PRN

## 2020-07-17 MED ORDER — HYDROXYZINE HCL 25 MG PO TABS: 25.0000 mg | ORAL_TABLET | Freq: Three times a day (TID) | ORAL | 0 refills | Status: DC | PRN

## 2020-07-17 MED ORDER — BISACODYL 10 MG RE SUPP: 10.0000 mg | Freq: Every day | RECTAL | 0 refills | Status: DC | PRN

## 2020-07-17 MED ORDER — LORAZEPAM 0.5 MG PO TABS: 0.5000 mg | ORAL_TABLET | ORAL | 1 refills | Status: AC | PRN

## 2020-07-17 MED ORDER — HYDROXYZINE HCL 25 MG PO TABS
25.0000 mg | ORAL_TABLET | Freq: Three times a day (TID) | ORAL | 0 refills | Status: AC | PRN
Start: 2020-07-17 — End: ?

## 2020-07-17 MED ORDER — ATROPINE SULFATE 1 % OP SOLN: 2.0000 [drp] | Freq: Four times a day (QID) | OPHTHALMIC | 2 refills | Status: AC | PRN

## 2020-07-17 MED ORDER — LORAZEPAM 0.5 MG PO TABS: 0.5000 mg | ORAL_TABLET | ORAL | 1 refills | Status: DC | PRN

## 2020-07-17 MED ORDER — MORPHINE SULFATE 10 MG/5ML PO SOLN
5.0000 mg | ORAL | 0 refills | Status: AC | PRN
Start: 2020-07-17 — End: ?

## 2020-07-17 MED ORDER — POLYETHYLENE GLYCOL 3350 17 G PO PACK: 17.0000 g | PACK | Freq: Every day | ORAL | 0 refills | Status: AC

## 2020-07-17 MED ORDER — ATROPINE SULFATE 1 % OP SOLN
2.0000 [drp] | Freq: Four times a day (QID) | OPHTHALMIC | 12 refills | Status: DC | PRN
Start: 2020-07-17 — End: 2020-07-17

## 2020-07-17 MED ORDER — POLYETHYLENE GLYCOL 3350 17 G PO PACK: 17.0000 g | PACK | Freq: Every day | ORAL | 0 refills | Status: DC

## 2020-07-17 NOTE — Progress Notes (Signed)
D/c current foley and insert new foley cath prior for pt going home per PA order. Pt tolerated well.   Lawson Radar, RN

## 2020-07-17 NOTE — TOC Progression Note (Signed)
Transition of Care [facility]) - Progression Note    Patient Details  Name: Holly Stevenson MRN: [id number] Date of Birth: [birth year]  [facility]) CM/SW Contact  Bess Kinds, RN Phone Number: [phone] 07/17/2020, 10:09 AM  Clinical Narrative:     Noted that patient has dc order. Spoke with patient's daughter, Efraim Kaufmann, on the phone. DME to be delivered "early afternoon." Melissa to call this CM as soon as DME delivered in order to have ambulance transport arranged. Confirmed dc address - [address] [place], [address].        Expected Discharge Plan and Services           Expected Discharge Date: 07/17/20                                     Social Determinants of Health (SDOH) Interventions    Readmission Risk Interventions Readmission Risk Prevention Plan 06/07/2020  Transportation Screening Complete  PCP or Specialist Appt within 5-7 Days Complete  Home Care Screening Complete  Medication Review (RN CM) Complete  Some recent data might be hidden

## 2020-07-17 NOTE — Progress Notes (Signed)
IV line discontinued, discharged paper given to PTAR staff,discharged to home alert and oriented via PTAR.

## 2020-07-17 NOTE — TOC Transition Note (Signed)
Transition of Care [facility]) - CM/SW Discharge Note   Patient Details  Name: Holly Stevenson MRN: [id number] Date of Birth: [birth year]  Transition of Care [facility]) CM/SW Contact:  Bess Kinds, RN Phone Number: [phone] 07/17/2020, 3:32 PM   Clinical Narrative:     Received notification from patient's daughter, Holly Stevenson, that the DME has been delivered and she is ready for transportation to be arranged. PTAR contacted for ambulance transportation. No further TOC needs identified at this time.   Final next level of care: Home w Hospice Care Barriers to Discharge: No Barriers Identified   Patient Goals and CMS Choice Patient states their goals for this hospitalization and ongoing recovery are:: home with hospice CMS Medicare.gov Compare Post Acute Care list provided to:: Patient Represenative (must comment) (daughter, Holly Stevenson) Choice offered to / list presented to : Adult Children  Discharge Placement                       Discharge Plan and Services                                     Social Determinants of Health (SDOH) Interventions     Readmission Risk Interventions Readmission Risk Prevention Plan 06/07/2020  Transportation Screening Complete  PCP or Specialist Appt within 5-7 Days Complete  Home Care Screening Complete  Medication Review (RN CM) Complete  Some recent data might be hidden

## 2020-07-17 NOTE — Consult Note (Signed)
WOC Nurse wound follow up Surgeon discontinued VAC this morning. Patient is going home with Hospice services today.  WOC nursing team will not follow, but will remain available to this patient, the nursing and medical teams.  Please re-consult if needed. Thanks, Ladona Mow, MSN, RN, GNP, [facility]  Pager# [phone]

## 2020-07-17 NOTE — Progress Notes (Addendum)
  Progress Note    07/17/2020 7:37 AM 12 Days Post-Op  Subjective:  Sleeping and I did not wake her.    Vitals:   07/17/20 0442 07/17/20 0732  BP: (!) 186/60 (!) 159/44  Pulse: 68 65  Resp: 18 17  Temp: 98.6 F (37 C) 97.7 F (36.5 C)  SpO2:  99%    Physical Exam: General:  Resting comfortably.  CBC    Component Value Date/Time   WBC 6.5 07/14/2020 0225   RBC 3.39 (L) 07/14/2020 0225   HGB 9.9 (L) 07/14/2020 0225   HCT 31.4 (L) 07/14/2020 0225   PLT 290 07/14/2020 0225   MCV 92.6 07/14/2020 0225   MCH 29.2 07/14/2020 0225   MCHC 31.5 07/14/2020 0225   RDW 16.4 (H) 07/14/2020 0225   LYMPHSABS 0.9 07/14/2020 0225   MONOABS 0.5 07/14/2020 0225   EOSABS 0.2 07/14/2020 0225   BASOSABS 0.0 07/14/2020 0225    BMET    Component Value Date/Time   NA 135 07/14/2020 0225   K 4.3 07/14/2020 0225   CL 98 07/14/2020 0225   CO2 28 07/14/2020 0225   GLUCOSE 183 (H) 07/14/2020 0225   BUN 26 (H) 07/14/2020 0225   CREATININE 0.48 07/14/2020 0225   CALCIUM 7.7 (L) 07/14/2020 0225   GFRNONAA >60 07/14/2020 0225   GFRAA 52 (L) 02/18/2018 1610    INR    Component Value Date/Time   INR 1.0 05/03/2020 1530     Intake/Output Summary (Last 24 hours) at 07/17/2020 0737 Last data filed at 07/16/2020 1617 Gross per 24 hour  Intake 385 ml  Output 700 ml  Net -315 ml     Assessment:  70 y.o. 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 I agree with the above.  Patient awake on my exam.  Daughter Melissa at bedside. Appears comfortable in excited to go home. We will  plan to transition to comfort measures only at home with home hospice. I have asked her to call my office for any concerns or questions.  Rande Brunt. Lenell Antu, MD [facility] of [facility] Phone Number: [phone] 07/17/2020 9:06 AM

## 2020-07-17 NOTE — Progress Notes (Signed)
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  Daughter Efraim Kaufmann knows to call [facility] manager once in place and then ambulance transport can be arranged.  Patient will need comfort/pain meds arranged, please send to: [facility] at [facility].  She should only need enough for two days as hospice will take this over once she becomes our patient. She will not be admitted under hospice services until Tuesday.   Thank you, Wallis Bamberg RN, BSN, CCRN [facility] Liaison

## 2020-07-17 NOTE — Discharge Summary (Signed)
Discharge Summary    Holly Stevenson 12/08/1950 70 y.o. 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 female well known to me with a complex recent vascular history. In brief, she underwent right iliofemoral endarterectomy, right-to-left fem-fem bypass, right fem-above knee popliteal bypass 05/10/20. She returned to care 06/01/20 with a large right groin pseudoaneurysm. This was repaired with right fem-fem interposition bypass. Her grafts had thrombosed requiring thrombectomy. She had return of adequate flow postoperatively.   She returns to clinic today reporting continued drainage from her right groin. No constitutional symptoms.  Hospital Course:  The patient was admitted to the hospital and taken to the operating room on 06/14/2020 and underwent: 1.  Sharp excisional debridement right groin wound to 11 x 4 x 4 cm 2.  Pulsavac irrigation right groin wound 3.  Interposition graft of femorofemoral bypass with 6 mm PTFE    Findings: There was approximately 300 cc of purulence that was foul-smelling this was cultured.  We debrided her groin back to healthy tissue.  Unfortunately the previous interposition graft anastomosis dehisced and she required interposition grafting from the 2 grafts with 6 mm PTFE.  At completion there was good flow distally in the right lower extremity and the femorofemoral was patent and the left foot was warm.  The wound was packed with wet-to-dry dressing.  She will need excision of all of her PTFE with placement of vein patch angioplasty and consideration of endovascular means of revascularization.  POD 1, Gross purulence discovered and with another graft dehiscence concerning for graft colonization with virulent organism. Needs excision of all  prosthetic vascular material to clear infection definitively. We will plan to do this in the operating room tomorrow. Will plan to patch angioplasty the areas of graft anastomosis with autogenous vein. If patient is tolerating this well will then try to recanalize the left lower extremity endovascularly. If unable to recanalize endovascularly, we may be able to do a redo femoropopliteal bypass with cadaveric vein. Patient will need large-bore venous access, arterial line, Foley catheter for the operation.  I have crossmatched her 4 units of blood.  Continue broad-spectrum IV antibiotics. Okay for diet tonight.  N.p.o. past midnight. Order for consent written.  Reviewed plan in detail with daughter and patient.  Will review again in the morning. Keep dressing to groin intact.  Do not change.  If any bleeding please call vascular team on call. Pending saphenous vein mapping for case planning purposes.  On 06/15/2020 she was taken back to the operating room and underwent: 1.  Excision of right to left femorofemoral bypass, right common femoral bovine pericardial patch and left femoral to popliteal artery bypass graft with reopening of left groin and left above-knee popliteal exposure incisions less than 30 days 2.  Harvest of right greater saphenous vein 3.  Patch angioplasty of right common femoral artery with greater saphenous vein, left common femoral endarterectomy and saphenous vein patch angioplasty, left popliteal artery vein angioplasty 4.  Aortogram 5.  Stent of left common and external iliac veins with 7 x 120 and 7 x 100 mm Elluvia 6.  Stent of left SFA with 6 x 120, 6 x 100, 6 x 120 and 6 x 60 mm Elluvia 7.  Sartorius muscle flap right groin wound  Findings: Right groin had significant fibrinous debris  that was debrided to healthy-appearing tissue.  The graft was grossly infected.  After obtaining exposure and proximal distal control in the right groin, left groin, above-knee left  popliteal incision we then heparinized the patient excised all of the grafts.  We are able to harvest a very diminutive right greater saphenous vein from below the knee this was the best vein I could identify with ultrasound.  We placed patch angioplasties on the bilateral common femoral arteries.  The left common femoral artery was significantly calcified I performed endarterectomy prior to patch angioplasty.  I also endarterectomized the above-knee popliteal artery on the left performed patch angioplasty.  We then performed stenting from a right groin we were able to snare the wire retrograde from the left groin that was in a dissected plane we were then able to work antegrade from the right groin in-stent the common external iliac arteries and balloon dilate these with 7 mm balloon.  We then work antegrade to stent the left SFA with drug-eluting stents.  After this the patient did have monophasic anterior tibial and posterior tibial signals.  There was a strong palpable common femoral pulse in the right.  There were after removing the sheath and placing a repair stitch unfortunately we had a waterhammer pulse which we then had to clamp and reopen the right common femoral artery perform repeat patch angioplasty.  At completion there were very strong signals in the right profunda femoris artery.  There was a high resistance signal in the right SFA.  Sartorius muscle flap was placed to cover the right common femoral artery and wound vacs were fashioned in both groins.  The pt tolerated the procedure well and was transported to the PACU in good condition.   In the PACU she developed acute limb ischemia with mottling of her left leg and dense sensory motor deficit.  She was subsequently indicated for takeback for evaluation of the left lower extremity acute limb ischemia.  On 06/16/2020, pt was taken to the operating room and underwent: 1.  Left common femoral and external iliac endarterectomy with vein patch  angioplasty 2.  Harvest left greater saphenous vein 3.  Ultrasound-guided cannulation right profunda femoris artery with primary closure 4.  Aortogram with bilateral iliofemoral runoff 5.  Bilateral common iliac artery stenting with 8 x 29 mm VBX 6.  Stent of left external iliac artery with 7 x 120 mm Elluvia 7.  Left popliteal artery endarterectomy 8.  Left common femoral to above-knee popliteal artery bypass with reversed cryopreserved greater saphenous vein 9.  Left lower extremity thromboembolectomy  Findings: Common femoral artery was thrombosed.  We opened the previous patch extended the arteriotomy high up onto the external leg artery.  I did excise one of the stents to facilitate clamping.  There was trickle inflow from the external iliac artery.  I performed embolectomy of the SFA stents and establish backbleeding.  I also performed embolectomy of the common external leg arteries I could not pass the [place] completely centrally but I did have trickle inflow.  I harvested the left greater saphenous vein performed patch angioplasty of the external leg artery down to the common femoral artery.  I then cannulated the left common femoral artery with plans for retrograde angiogram.  Unfortunately I could not cross retrograde into the aorta due to the dissection from earlier.  I was able to cross up and over confirm intraluminal access.  I cannulated the profunda femoris artery retrograde I was able to get access into  the aorta from there and snare the wire up and over.  I then placed bilateral common iliac artery stents.  Unfortunately from the left common iliac artery stent I was still not able to get access into the aorta as the wire would only go into the dissection plane and up and over into the other stent.  I did have strong inflow.  Unfortunately at completion I had no signals distally.  I reopened the left common femoral artery.  There was no backbleeding.  I elected for bypass at this time.  I  performed an end-to-side left common femoral artery and end-to-end left popliteal artery anastomosis.  I did have to perform endarterectomy of the popliteal artery for clamping and bypass.  At completion there were monophasic anterior tibial and posterior tibial signals.  As patient was given significant transfusion and due to the timing of the night she remained intubated with plans for transfer to the ICU.  Afterward, ischemia improved but still marginal preop ABI prior to initial revasc was 0.2 left and 0.5 right.  Low dose heparin was continued as well as IV abx.  She did have acute blood loss anemia and this improved after transfusion.   She was extubated and transferred to 4 east on 06/17/2020.  She had an aortoiliac duplex that day as her leg was profoundly ischemia so axillary popliteal bypass is not feasible.  Only other option would be extraanatomic ax profunda to heal aka.  This would be very high risk in light of recent groin sepsis.  Dr. Darrick Penna d/w pt and and her daughter all of the above.  Will proceed with high AKA tomorrow in hopes of avoiding further procedures but may have difficulty healing this and require ax pop.  On 06/18/2020, she was taken to the operating room and underwent left above knee amputation and bilateral groin wound vac change.   On 06/21/2020, she was taken to the operating room and underwent: Washout of bilateral groin wounds with sharp excisional debridement and negative pressure wound VAC placement (right groin measures 11 cm x 2.5 cm x 3 cm and left groin measures 8 cm x 2.5 cm x 4 cm)  Indications: Patient is a 70 year old female who has a complex history of a right to left femoral femoral bypass as well as a left lower extremity bypass.  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  She presents today for groin washout and VAC change after risks and benefits discussed.  Assistant was needed  for exposure and to expedite the case.  Findings: The medial edge of the right groin sartorius flap did require some debridement but overall the wound looked viable.  Some of the subcutaneous tissue in the left groin wound also required debridement but again the wound overall looked viable and we were able to reapproximate some soft tissue coverage over top of the femoral artery and cryobypass.  Black vac sponges were placed in both groins.  On 1/7, the wound vacs were changed and were found to be healing well with mild fibrinous exudate at the base of the right groin and both wounds significantly smaller than previous.  IV abx continued.    On 1/9, a foley was placed for urinary retention.  AKA looked viable.  IV abx continued.  On 1/10, a coretrak was placed for nutrition given poor nutritional status.    1/11, pt had CTA for abrupt onset of shortness  of breath, which suggests cavitary lesion and likely PNA.  Pulmonary was consulted.  abx were broadened.  AFB was negative x 2.    ID was consulted.   Recommendations to start rifampin, ethambutol and INH and will stop cefepime and vanc.  M [place] cavitary pneumonia - on three drug therapy and tolerating.  Hopefully treatment will show improvement radiographically over the next 2-3 months.  If improving and patient tolerating treatment, will plan on 12-18 months of treatment.    DM coordinator was consulted for poor BG control.    1/16, flomax was added for urinary retention as foley would need to be removed.  She was started on ativan for anxiety/panic attacks.   An interdisciplinary GOC meeting was arranged.   Discussion: We discussed goals of care for Holly Amor .  I explained that her condition has declined today and she has become encephalopathic with worsening hypercarbic respiratory failure now requiring non-invasive mechanical ventilation.  We are treating with lasix, antibiotics and NIMV.  If she had a cardiac or respiratory  arrest she would require mechanical ventilation or CPR and at that point would have a <5% chance of surviving to hospital discharge given her advanced comorbid illnesses.  If she survived she would require long term hospitalization such as LTACH then SNF placement.  Efraim Kaufmann states that her mother wouldn't want that.  Code status: Full DNR , continue full medical support but no vasopressors, no ICU transfer.    Per pulmonary: Not much improvement with BIPAP/lasix See plan of care note, now dnr Will stop tube feedings in case this is aspiration pneumonitis Cefepime added Continue lasix No ICU transfer  1/18, respiratory status appeared better on 5LNC.  Cefepime added back.  Swallow evaluation done and diet recommendations was regular/thin liquid. Liquid Administration via: Straw;Cup Medication Administration: Whole meds with liquid Supervision: Patient able to self feed;Intermittent supervision to cue for compensatory strategies Compensations: Slow rate;Small sips/bites;Other (Comment) (Hold POs if short of breath or coughing) Postural Changes: Seated upright at 90 degrees;Remain upright for at least 30 minutes after po intake   On 1/19, she did have ABLA and was transfused.  She did have some bleeding from the right groin and emergent exploration was required.  She was taken to the OR and underwent:  Repair of right common femoral artery patch with pledgeted 5-0 sutures Redo sartorius muscle flap  Findings: There was an area of bleeding from the 7 o'clock position of the patch which was repaired with multiple pledgeted 5-0 Prolene sutures.  Good hemostasis was obtained.  Pt was extubated.  She had bilateral pleural effusions and chest tube on the right with good output.  The right pigtail was removed.  She had a thoracentesis on the left on 07/06/2020.  The chest tube was removed.   On 1/21, She has had no additional bleeding events since she was taken emergently to the operating room on  Wednesday. This morning she remains on 2 L nasal cannula in the ICU. Critical care had placed a pigtail catheter in the right chest and did a thoracentesis of her left chest yesterday. Her breathing seems pretty stable. She is due for VAC changes today in both groins with wound care.  She has a very good PT signal in the right foot and the right leg looks viable. 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 We will have to watch this. She should be okay for transfer to the floor. Appreciate ID  input on antibiotics plan is 12 months due to M. [place] pneumonia.    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  She has no in-line flow to heal this amputation. While the amputation appeared healthy until this past weekend, I fear that this will eventually dehisce and / or become infected. Should this happen she is at risk for overwhelming sepsis and death.  There are no realistic revascularization options for her. Left axillary-profunda bypass may be technically possible, but is not a good option for her. The requirement for synthetic conduit and her active groin / pulmonary infection makes the risk of recurrent graft infection prohibitively high. Furthermore, this may not provide enough flow to allow her AKA to heal.  Conversion to left hip disarticulation carries a high risk of poor healing. Should this not heal, she would almost certainly expire.  I have offered her a trial of watchful waiting to allow the left AKA to declare itself and only conservative / local revision after this will be considered. She seems most interested in this. She is interested in speaking with palliative care to define goals of care and to make end-of-life plans.   Unfortunately, I think her risk of mortality from this is very high no matter what course of action we take.   On 1/26, she had an episode of increased SOB and tachypnea.  TF were held and lasix given.  ABG was unremarkable.    Palliative care consult was obtained and performed on 1/27.   Recommendations:  DNR/DNI-as confirmed by patient  Continue with current plan of care per medical team   No artificial feedings/PEG (prolonged)  Patient expresses she needs to have further discussions with her daughter and make sure she has a clear understanding of her current condition. Would like to make decisions once she knows they are on the same page. Shares she may want providers to speak with daughter and provide updates.   Request follow-up tomorrow for further discussion and possibility of including daughter if needed.   PMT will continue to support and follow. Please call team line with urgent needs.  A family meeting was scheduled for the weekend.  DNR/DNI with no long term artificial feedings and plans for dc home with hospice was arranged.    Her NGT was removed.  Her wound vac was removed and transitioned to wet to dry dressing changes. TOC arranged for HH needs and these are to be delivered on 1/31.  She is discharged home daughter.    CBC    Component Value Date/Time   WBC 6.5 07/14/2020 0225   RBC 3.39 (L) 07/14/2020 0225   HGB 9.9 (L) 07/14/2020 0225   HCT 31.4 (L) 07/14/2020 0225   PLT 290 07/14/2020 0225   MCV 92.6 07/14/2020 0225   MCH 29.2 07/14/2020 0225   MCHC 31.5 07/14/2020 0225   RDW 16.4 (H) 07/14/2020 0225   LYMPHSABS 0.9 07/14/2020 0225   MONOABS 0.5 07/14/2020 0225   EOSABS 0.2 07/14/2020 0225   BASOSABS 0.0 07/14/2020 0225    BMET    Component Value Date/Time   NA 135 07/14/2020 0225   K 4.3 07/14/2020 0225   CL 98 07/14/2020 0225   CO2 28 07/14/2020 0225   GLUCOSE 183 (H) 07/14/2020 0225   BUN 26 (H) 07/14/2020 0225   CREATININE 0.48 07/14/2020 0225   CALCIUM 7.7 (L) 07/14/2020 0225   GFRNONAA >60 07/14/2020 0225   GFRAA 52 (L) 02/18/2018 1610      Discharge  Instructions    Discharge patient   Complete by: As directed    D/c home with hospice   Discharge  disposition: 01-Home or Self Care   Discharge patient date: 07/17/2020      Discharge Diagnosis:  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 Pleural effusion, left   . Status post surgery 06/16/2020  . Pressure injury of skin 06/16/2020  . Respiratory failure (HCC)   . Infected prosthetic vascular graft (HCC) 06/14/2020  . 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 Hyperlipidemia 05/31/2020  . Personal history of colonic polyps 05/31/2020  . Polyneuropathy 05/31/2020  . Tobacco user 05/31/2020  . COPD (chronic obstructive pulmonary disease) (HCC) 05/29/2020  . Critical lower limb ischemia (HCC) 04/27/2020  . PAD (peripheral artery disease) (HCC) 04/26/2020  . Cellulitis of toe of left foot   . Idiopathic chronic gout of left knee without tophus   . Gangrene of toe of left foot (HCC)   . Diabetic infection of left foot (HCC) 04/25/2020  . DM2 (diabetes mellitus, type 2) (HCC) 04/25/2020  . CKD (chronic kidney disease) stage 3, GFR 30-59 ml/min (HCC) 04/25/2020  . HTN (hypertension) 04/25/2020  . Cavitary lesion of lung 04/25/2020   Past Medical History:  Diagnosis Date  . Diabetes mellitus without complication (HCC)    Type II  . High cholesterol   . Hypertension   . Pneumonia      Allergies as of 07/17/2020   No Known Allergies     Medication List    STOP taking these medications   apixaban 5 MG Tabs tablet Commonly known as: ELIQUIS   aspirin 81 MG EC tablet   atorvastatin 40 MG tablet Commonly known as: LIPITOR   benazepril 10 MG tablet Commonly known as: LOTENSIN   cholecalciferol 25 MCG (1000 UNIT) tablet Commonly known as: VITAMIN D3   feeding supplement (GLUCERNA SHAKE) Liqd   Jardiance 25 MG Tabs tablet Generic drug: empagliflozin   metFORMIN 500 MG tablet Commonly known as: GLUCOPHAGE   multivitamin with minerals tablet    oxyCODONE-acetaminophen 5-325 MG tablet Commonly known as: PERCOCET/ROXICET   Spiriva Respimat 1.25 MCG/ACT Aers Generic drug: Tiotropium Bromide Monohydrate     TAKE these medications   acetaminophen 500 MG tablet Commonly known as: TYLENOL Take 1,500 mg by mouth 3 (three) times daily as needed for moderate pain or headache.   atropine 1 % ophthalmic solution Place 2 drops under the tongue 4 (four) times daily as needed.   bisacodyl 10 MG suppository Commonly known as: DULCOLAX Place 1 suppository (10 mg total) rectally daily as needed for moderate constipation.   hydroxypropyl methylcellulose / hypromellose 2.5 % ophthalmic solution Commonly known as: ISOPTO TEARS / GONIOVISC Place 1 drop into both eyes 3 (three) times daily as needed for dry eyes.   hydrOXYzine 25 MG tablet Commonly known as: ATARAX/VISTARIL Take 1 tablet (25 mg total) by mouth 3 (three) times daily as needed for anxiety.   LORazepam 0.5 MG tablet Commonly known as: ATIVAN Take 1 tablet (0.5 mg total) by mouth every 4 (four) hours as needed for anxiety.   morphine 10 MG/5ML solution Take 2.5 mLs (5 mg total) by mouth every 2 (two) hours as needed for severe pain (dyspnea/pain).   OLANZapine 2.5 MG tablet Commonly known as: ZYPREXA Take 1 tablet (2.5 mg total) by mouth at bedtime.   polyethylene glycol 17 g packet Commonly  known as: MIRALAX / GLYCOLAX Take 17 g by mouth daily.       Prescriptions given: Morphine 2.15ml (5mg  total) po q4h prn severe pain/dyspnea #227ml (discussed with 45m with [facility])  Disposition: Home hospice with daughter  Patient's condition: is Poor   [facility], PA-C [facility] [phone] 07/17/2020  10:12 AM

## 2020-07-17 NOTE — Plan of Care (Signed)

## 2020-08-18 LAB — ACID FAST CULTURE WITH REFLEXED SENSITIVITIES (MYCOBACTERIA): Acid Fast Culture: NEGATIVE

## 2020-10-02 ENCOUNTER — Emergency Department (HOSPITAL_COMMUNITY): Payer: BC Managed Care – PPO

## 2020-10-02 ENCOUNTER — Other Ambulatory Visit: Payer: Self-pay

## 2020-10-02 ENCOUNTER — Inpatient Hospital Stay (HOSPITAL_COMMUNITY)
Admission: EM | Admit: 2020-10-02 | Discharge: 2020-10-05 | DRG: 689 | Disposition: A | Discharge status: Hospice | Payer: BC Managed Care – PPO | Attending: Internal Medicine | Admitting: Internal Medicine

## 2020-10-02 ENCOUNTER — Encounter (HOSPITAL_COMMUNITY): Payer: Self-pay | Admitting: Internal Medicine

## 2020-10-02 DIAGNOSIS — I96 Gangrene, not elsewhere classified: Secondary | ICD-10-CM | POA: Diagnosis not present

## 2020-10-02 DIAGNOSIS — Y835 Amputation of limb(s) as the cause of abnormal reaction of the patient, or of later complication, without mention of misadventure at the time of the procedure: Secondary | ICD-10-CM | POA: Diagnosis present

## 2020-10-02 DIAGNOSIS — Z87891 Personal history of nicotine dependence: Secondary | ICD-10-CM

## 2020-10-02 DIAGNOSIS — E876 Hypokalemia: Secondary | ICD-10-CM | POA: Diagnosis present

## 2020-10-02 DIAGNOSIS — N3000 Acute cystitis without hematuria: Secondary | ICD-10-CM

## 2020-10-02 DIAGNOSIS — L89154 Pressure ulcer of sacral region, stage 4: Secondary | ICD-10-CM | POA: Diagnosis present

## 2020-10-02 DIAGNOSIS — I10 Essential (primary) hypertension: Secondary | ICD-10-CM | POA: Diagnosis present

## 2020-10-02 DIAGNOSIS — J449 Chronic obstructive pulmonary disease, unspecified: Secondary | ICD-10-CM | POA: Diagnosis present

## 2020-10-02 DIAGNOSIS — E1122 Type 2 diabetes mellitus with diabetic chronic kidney disease: Secondary | ICD-10-CM | POA: Diagnosis not present

## 2020-10-02 DIAGNOSIS — Z89612 Acquired absence of left leg above knee: Secondary | ICD-10-CM

## 2020-10-02 DIAGNOSIS — E1152 Type 2 diabetes mellitus with diabetic peripheral angiopathy with gangrene: Secondary | ICD-10-CM | POA: Diagnosis present

## 2020-10-02 DIAGNOSIS — Z7901 Long term (current) use of anticoagulants: Secondary | ICD-10-CM

## 2020-10-02 DIAGNOSIS — R4182 Altered mental status, unspecified: Secondary | ICD-10-CM | POA: Diagnosis not present

## 2020-10-02 DIAGNOSIS — Z515 Encounter for palliative care: Secondary | ICD-10-CM

## 2020-10-02 DIAGNOSIS — N1831 Chronic kidney disease, stage 3a: Secondary | ICD-10-CM | POA: Diagnosis not present

## 2020-10-02 DIAGNOSIS — Z978 Presence of other specified devices: Secondary | ICD-10-CM

## 2020-10-02 DIAGNOSIS — Z66 Do not resuscitate: Secondary | ICD-10-CM | POA: Diagnosis present

## 2020-10-02 DIAGNOSIS — T8754 Necrosis of amputation stump, left lower extremity: Secondary | ICD-10-CM | POA: Diagnosis present

## 2020-10-02 DIAGNOSIS — E119 Type 2 diabetes mellitus without complications: Secondary | ICD-10-CM

## 2020-10-02 DIAGNOSIS — Z79899 Other long term (current) drug therapy: Secondary | ICD-10-CM | POA: Diagnosis not present

## 2020-10-02 DIAGNOSIS — G9341 Metabolic encephalopathy: Secondary | ICD-10-CM | POA: Diagnosis present

## 2020-10-02 DIAGNOSIS — E7800 Pure hypercholesterolemia, unspecified: Secondary | ICD-10-CM | POA: Diagnosis present

## 2020-10-02 DIAGNOSIS — R400 Somnolence: Secondary | ICD-10-CM

## 2020-10-02 DIAGNOSIS — Z20822 Contact with and (suspected) exposure to covid-19: Secondary | ICD-10-CM | POA: Diagnosis present

## 2020-10-02 DIAGNOSIS — N390 Urinary tract infection, site not specified: Secondary | ICD-10-CM | POA: Diagnosis not present

## 2020-10-02 LAB — CBC WITH DIFFERENTIAL/PLATELET
Abs Immature Granulocytes: 0.04 10*3/uL (ref 0.00–0.07)
Basophils Absolute: 0 10*3/uL (ref 0.0–0.1)
Basophils Relative: 0 %
Eosinophils Absolute: 0.1 10*3/uL (ref 0.0–0.5)
Eosinophils Relative: 1 %
HCT: 29.9 % — ABNORMAL LOW (ref 36.0–46.0)
Hemoglobin: 9.3 g/dL — ABNORMAL LOW (ref 12.0–15.0)
Immature Granulocytes: 1 %
Lymphocytes Relative: 13 %
Lymphs Abs: 1 10*3/uL (ref 0.7–4.0)
MCH: 26.3 pg (ref 26.0–34.0)
MCHC: 31.1 g/dL (ref 30.0–36.0)
MCV: 84.5 fL (ref 80.0–100.0)
Monocytes Absolute: 0.7 10*3/uL (ref 0.1–1.0)
Monocytes Relative: 9 %
Neutro Abs: 6 10*3/uL (ref 1.7–7.7)
Neutrophils Relative %: 76 %
Platelets: 251 10*3/uL (ref 150–400)
RBC: 3.54 MIL/uL — ABNORMAL LOW (ref 3.87–5.11)
RDW: 15.5 % (ref 11.5–15.5)
WBC: 7.8 10*3/uL (ref 4.0–10.5)
nRBC: 0 % (ref 0.0–0.2)

## 2020-10-02 LAB — BLOOD GAS, VENOUS
Acid-Base Excess: 4.5 mmol/L — ABNORMAL HIGH (ref 0.0–2.0)
Bicarbonate: 29.4 mmol/L — ABNORMAL HIGH (ref 20.0–28.0)
O2 Saturation: 82.5 %
Patient temperature: 98.6
pCO2, Ven: 47.6 mmHg (ref 44.0–60.0)
pH, Ven: 7.407 (ref 7.250–7.430)
pO2, Ven: 51.1 mmHg — ABNORMAL HIGH (ref 32.0–45.0)

## 2020-10-02 LAB — URINALYSIS, ROUTINE W REFLEX MICROSCOPIC
Bilirubin Urine: NEGATIVE
Glucose, UA: NEGATIVE mg/dL
Ketones, ur: NEGATIVE mg/dL
Nitrite: NEGATIVE
Protein, ur: NEGATIVE mg/dL
Specific Gravity, Urine: 1.01 (ref 1.005–1.030)
WBC, UA: >50 WBC/hpf — ABNORMAL HIGH (ref 0–5)
pH: 7 (ref 5.0–8.0)

## 2020-10-02 LAB — COMPREHENSIVE METABOLIC PANEL
ALT: 14 U/L (ref 0–44)
AST: 16 U/L (ref 15–41)
Albumin: 2.1 g/dL — ABNORMAL LOW (ref 3.5–5.0)
Alkaline Phosphatase: 67 U/L (ref 38–126)
Anion gap: 9 (ref 5–15)
BUN: 7 mg/dL — ABNORMAL LOW (ref 8–23)
CO2: 27 mmol/L (ref 22–32)
Calcium: 8.4 mg/dL — ABNORMAL LOW (ref 8.9–10.3)
Chloride: 100 mmol/L (ref 98–111)
Creatinine, Ser: 0.41 mg/dL — ABNORMAL LOW (ref 0.44–1.00)
GFR, Estimated: >60 mL/min (ref >60)
Glucose, Bld: 110 mg/dL — ABNORMAL HIGH (ref 70–99)
Potassium: 3.3 mmol/L — ABNORMAL LOW (ref 3.5–5.1)
Sodium: 136 mmol/L (ref 135–145)
Total Bilirubin: 0.8 mg/dL (ref 0.3–1.2)
Total Protein: 6.5 g/dL (ref 6.5–8.1)

## 2020-10-02 LAB — CBC
HCT: 24.7 % — ABNORMAL LOW (ref 36.0–46.0)
Hemoglobin: 7.5 g/dL — ABNORMAL LOW (ref 12.0–15.0)
MCH: 26.7 pg (ref 26.0–34.0)
MCHC: 30.4 g/dL (ref 30.0–36.0)
MCV: 87.9 fL (ref 80.0–100.0)
Platelets: 202 10*3/uL (ref 150–400)
RBC: 2.81 MIL/uL — ABNORMAL LOW (ref 3.87–5.11)
RDW: 15.7 % — ABNORMAL HIGH (ref 11.5–15.5)
WBC: 5.3 10*3/uL (ref 4.0–10.5)
nRBC: 0 % (ref 0.0–0.2)

## 2020-10-02 LAB — CBG MONITORING, ED: Glucose-Capillary: 112 mg/dL — ABNORMAL HIGH (ref 70–99)

## 2020-10-02 LAB — GLUCOSE, CAPILLARY: Glucose-Capillary: 101 mg/dL — ABNORMAL HIGH (ref 70–99)

## 2020-10-02 LAB — LACTIC ACID, PLASMA
Lactic Acid, Venous: 0.9 mmol/L (ref 0.5–1.9)
Lactic Acid, Venous: 0.8 mmol/L (ref 0.5–1.9)

## 2020-10-02 LAB — AMMONIA: Ammonia: 24 umol/L (ref 9–35)

## 2020-10-02 LAB — CREATININE, SERUM: Creatinine, Ser: <0.3 mg/dL — ABNORMAL LOW (ref 0.44–1.00)

## 2020-10-02 MED ORDER — ACETAMINOPHEN 325 MG PO TABS: 650.0000 mg | ORAL_TABLET | Freq: Four times a day (QID) | ORAL | Status: DC | PRN

## 2020-10-02 MED ORDER — SODIUM CHLORIDE 0.9 % IV SOLN
1.0000 g | INTRAVENOUS | Status: AC
  Administered 2020-10-03 – 2020-10-04 (×2): 1 g via INTRAVENOUS
  Filled 2020-10-02: qty 10
  Filled 2020-10-02: qty 1

## 2020-10-02 MED ORDER — SODIUM CHLORIDE 0.9 % IV SOLN: 1.0000 g | INTRAVENOUS | Status: DC

## 2020-10-02 MED ORDER — SODIUM CHLORIDE 0.9 % IV SOLN: INTRAVENOUS | Status: DC

## 2020-10-02 MED ORDER — ENOXAPARIN SODIUM 40 MG/0.4ML ~~LOC~~ SOLN
40.0000 mg | SUBCUTANEOUS | Status: DC
  Administered 2020-10-02 – 2020-10-04 (×3): 40 mg via SUBCUTANEOUS
  Filled 2020-10-02 (×3): qty 0.4

## 2020-10-02 MED ORDER — SODIUM CHLORIDE 0.9 % IV SOLN
1.0000 g | Freq: Once | INTRAVENOUS | Status: AC
  Administered 2020-10-02: 1 g via INTRAVENOUS
  Filled 2020-10-02: qty 10

## 2020-10-02 MED ORDER — MORPHINE SULFATE (CONCENTRATE) 10 MG/0.5ML PO SOLN
5.0000 mg | ORAL | Status: DC | PRN
Start: 2020-10-02 — End: 2020-10-05
  Administered 2020-10-03 – 2020-10-05 (×4): 5 mg via ORAL
  Filled 2020-10-02 (×4): qty 0.5

## 2020-10-02 MED ORDER — INSULIN ASPART 100 UNIT/ML ~~LOC~~ SOLN
0.0000 [IU] | Freq: Three times a day (TID) | SUBCUTANEOUS | Status: DC
  Administered 2020-10-03 (×3): 1 [IU] via SUBCUTANEOUS
  Administered 2020-10-04: 2 [IU] via SUBCUTANEOUS
  Administered 2020-10-05: 3 [IU] via SUBCUTANEOUS
  Filled 2020-10-02: qty 0.09

## 2020-10-02 MED ORDER — SODIUM CHLORIDE 0.9 % IV BOLUS
1000.0000 mL | Freq: Once | INTRAVENOUS | Status: AC
  Administered 2020-10-02: 1000 mL via INTRAVENOUS

## 2020-10-02 MED ORDER — ACETAMINOPHEN 650 MG RE SUPP: 650.0000 mg | Freq: Four times a day (QID) | RECTAL | Status: DC | PRN

## 2020-10-02 NOTE — ED Provider Notes (Signed)
[facility]-EMERGENCY DEPT Provider Note   CSN: [id number] Arrival date & time: 10/02/20  1536     History Chief Complaint  Patient presents with  . Altered Mental Status    Holly Stevenson is a 70 y.o. female.  HPI   Pt was sent in to the ED for altered mental status.  Per hospice report she was started on cipro on the 12 for uti.  She had a fever today per report.  Family stated her mental status is diminished today.  They want to her to come to the ED for iv abx.    At baseline the patient is bedbound pt has known dry gangrene of left thigh.  It has been progressing.  Pt has wounds in her inguinal region and in sacrum that has been draining.   In the ED the patient is very minimally responsive.  She will grimace as I examine her but she does not answer any questions. Past Medical History:  Diagnosis Date  . Diabetes mellitus without complication (HCC)    Type II  . High cholesterol   . Hypertension   . Pneumonia     Patient Active Problem List   Diagnosis Date Noted  . Pleural effusion, left   . Status post surgery 06/16/2020  . Pressure injury of skin 06/16/2020  . Respiratory failure (HCC)   . Infected prosthetic vascular graft (HCC) 06/14/2020  . 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 Hyperlipidemia 05/31/2020  . Personal history of colonic polyps 05/31/2020  . Polyneuropathy 05/31/2020  . Tobacco user 05/31/2020  . COPD (chronic obstructive pulmonary disease) (HCC) 05/29/2020  . Critical lower limb ischemia (HCC) 04/27/2020  . PAD (peripheral artery disease) (HCC) 04/26/2020  . Cellulitis of toe of left foot   . Idiopathic chronic gout of left knee without tophus   . Gangrene of toe of left foot (HCC)   . Diabetic infection of left foot (HCC) 04/25/2020  . DM2 (diabetes mellitus, type 2) (HCC) 04/25/2020  . CKD (chronic kidney disease) stage 3, GFR 30-59 ml/min (HCC) 04/25/2020  . HTN (hypertension) 04/25/2020  . Cavitary lesion  of lung 04/25/2020    Past Surgical History:  Procedure Laterality Date  . ABDOMINAL AORTOGRAM W/LOWER EXTREMITY Bilateral 04/28/2020   Procedure: ABDOMINAL AORTOGRAM W/LOWER EXTREMITY;  Surgeon: Sherren Kerns, MD;  Location: [facility] INVASIVE CV LAB;  Service: Cardiovascular;  Laterality: Bilateral;  . AMPUTATION Left 06/18/2020   Procedure: LEFT ABOVE KNEE AMPUTATION;  Surgeon: Sherren Kerns, MD;  Location: [facility] OR;  Service: Vascular;  Laterality: Left;  . ANGIOPLASTY N/A 06/15/2020   Procedure: VEIN PATCH ANGIOPLASTY LEFT COMMON, EXTERNAL ILIAC, and COMMON FEMORAL ARTERY;  Surgeon: Maeola Harman, MD;  Location: [facility] OR;  Service: Vascular;  Laterality: N/A;  . AORTOGRAM N/A 06/15/2020   Procedure: AORTOGRAM;  Surgeon: Maeola Harman, MD;  Location: [facility] OR;  Service: Vascular;  Laterality: N/A;  . AORTOGRAM N/A 06/15/2020   Procedure: AORTOGRAM;  Surgeon: Maeola Harman, MD;  Location: [facility] OR;  Service: Vascular;  Laterality: N/A;  . APPLICATION OF WOUND VAC Bilateral 06/18/2020   Procedure: EXCHANGE OF WOUND VAC;  Surgeon: Sherren Kerns, MD;  Location: [facility] OR;  Service: Vascular;  Laterality: Bilateral;  . APPLICATION OF WOUND VAC Bilateral 06/15/2020   Procedure: APPLICATION OF WOUND VAC BILATERAL GROINS;  Surgeon: Maeola Harman, MD;  Location: [facility] OR;  Service: Vascular;  Laterality: Bilateral;  . APPLICATION OF WOUND VAC Bilateral 06/15/2020  Procedure: APPLICATION OF WOUND VAC BILATERAL GROINS;  Surgeon: Maeola Harman, MD;  Location: [facility] OR;  Service: Vascular;  Laterality: Bilateral;  . APPLICATION OF WOUND VAC Bilateral 06/21/2020   Procedure: WOUND VAC CHANGE;  Surgeon: Cephus Shelling, MD;  Location: [facility] OR;  Service: Vascular;  Laterality: Bilateral;  . BELOW KNEE LEG AMPUTATION Left 06/18/2020  . BRONCHIAL BIOPSY  05/23/2020   Procedure: BRONCHIAL BIOPSIES;  Surgeon: Leslye Peer, MD;  Location: [facility] ENDOSCOPY;  Service:  Cardiopulmonary;;  . BRONCHIAL BIOPSY  06/06/2020   Procedure: BRONCHIAL BIOPSIES;  Surgeon: Leslye Peer, MD;  Location: [facility] ENDOSCOPY;  Service: Pulmonary;;  . BRONCHIAL BRUSHINGS  05/23/2020   Procedure: BRONCHIAL BRUSHINGS;  Surgeon: Leslye Peer, MD;  Location: [facility] ENDOSCOPY;  Service: Cardiopulmonary;;  . BRONCHIAL BRUSHINGS  06/06/2020   Procedure: BRONCHIAL BRUSHINGS;  Surgeon: Leslye Peer, MD;  Location: [facility] ENDOSCOPY;  Service: Pulmonary;;  . BRONCHIAL NEEDLE ASPIRATION BIOPSY  05/23/2020   Procedure: BRONCHIAL NEEDLE ASPIRATION BIOPSIES;  Surgeon: Leslye Peer, MD;  Location: [facility] ENDOSCOPY;  Service: Cardiopulmonary;;  . BRONCHIAL NEEDLE ASPIRATION BIOPSY  06/06/2020   Procedure: BRONCHIAL NEEDLE ASPIRATION BIOPSIES;  Surgeon: Leslye Peer, MD;  Location: [facility] ENDOSCOPY;  Service: Pulmonary;;  . BRONCHIAL WASHINGS  05/23/2020   Procedure: BRONCHIAL WASHINGS;  Surgeon: Leslye Peer, MD;  Location: [facility] ENDOSCOPY;  Service: Cardiopulmonary;;  . BRONCHIAL WASHINGS  06/06/2020   Procedure: BRONCHIAL WASHINGS;  Surgeon: Leslye Peer, MD;  Location: [facility] ENDOSCOPY;  Service: Pulmonary;;  . COLONOSCOPY W/ BIOPSIES AND POLYPECTOMY    . ELECTROMAGNETIC NAVIGATION BROCHOSCOPY N/A 05/23/2020   Procedure: ELECTROMAGNETIC NAVIGATION BRONCHOSCOPY;  Surgeon: Leslye Peer, MD;  Location: [facility] ENDOSCOPY;  Service: Cardiopulmonary;  Laterality: N/A;  . ENDARTERECTOMY FEMORAL Right 05/10/2020   Procedure: ENDARTERECTOMY RIGHT FEMORAL;  Surgeon: Leonie Douglas, MD;  Location: [facility] OR;  Service: Vascular;  Laterality: Right;  . ENDARTERECTOMY FEMORAL Bilateral 06/15/2020   Procedure: BILATERAL FEMORAL ENDARTERECTOMY WITH PATCH ANGIOPLASTY RIGHT FERMORAL;  Surgeon: Maeola Harman, MD;  Location: [facility] OR;  Service: Vascular;  Laterality: Bilateral;  . FEMORAL-FEMORAL BYPASS GRAFT Bilateral 05/10/2020   Procedure: RIGHT TO LEFT FEMORAL-FEMORAL ARTERY BYPASS GRAFT;  Surgeon: Leonie Douglas, MD;  Location: [facility] OR;  Service: Vascular;  Laterality: Bilateral;  . FEMORAL-FEMORAL BYPASS GRAFT Right 06/01/2020   Procedure: INTERPOSITION GRAFT RIGHT COMMON FEMORAL TO FEMORAL-FEMORAL BYPASS;  Surgeon: Leonie Douglas, MD;  Location: [facility] OR;  Service: Vascular;  Laterality: Right;  . FEMORAL-FEMORAL BYPASS GRAFT Right 06/14/2020   Procedure: interposition FEMORAL-FEMORAL ARTERY bypass using 57mm graft;  Surgeon: Maeola Harman, MD;  Location: [facility] OR;  Service: Vascular;  Laterality: Right;  . FEMORAL-POPLITEAL BYPASS GRAFT Left 05/10/2020   Procedure: LEFT FEMORAL-POPLITEAL ARTERY BYPASS GRAFT;  Surgeon: Leonie Douglas, MD;  Location: [facility] OR;  Service: Vascular;  Laterality: Left;  . FEMORAL-POPLITEAL BYPASS GRAFT Left 06/15/2020   Procedure: BYPASS GRAFT FEMORAL TO ABOVE KNEE POPLITEAL ARTERY USING CYRO SAPHENOUS VEIN;  Surgeon: Maeola Harman, MD;  Location: [facility] OR;  Service: Vascular;  Laterality: Left;  . GROIN DISSECTION Right 06/01/2020   Procedure: RIGHT GROIN EXPLORATION;  Surgeon: Leonie Douglas, MD;  Location: [facility] OR;  Service: Vascular;  Laterality: Right;  . INSERTION OF ILIAC STENT Bilateral 06/15/2020   Procedure: INSERTION OF BILATERAL COMMON ILIAC STENTS, INSERTION OF LEFT EXTERNAL ILIAC STENT;  Surgeon: Maeola Harman, MD;  Location: [facility] OR;  Service: Vascular;  Laterality: Bilateral;  .  INTRAOPERATIVE ARTERIOGRAM Left 06/01/2020   Procedure: LEFT LOWER EXTREMITY  ARTERIOGRAM;  Surgeon: Leonie Douglas, MD;  Location: [facility] OR;  Service: Vascular;  Laterality: Left;  . LOWER EXTREMITY ANGIOGRAM Left 05/10/2020   Procedure: LEFT LOWER EXTREMITY ANGIOGRAM;  Surgeon: Leonie Douglas, MD;  Location: [facility] OR;  Service: Vascular;  Laterality: Left;  . LOWER EXTREMITY ANGIOGRAM Left 06/15/2020   Procedure: ANGIOGRAM SUPERFICIAL FEMORAL ARTERY x 4 WITH LEFT COMMON ILLIAC STENT AND STENTING LEFT LOWER EXTREMITY;  Surgeon: Maeola Harman, MD;   Location: [facility] OR;  Service: Vascular;  Laterality: Left;  . LOWER EXTREMITY ANGIOGRAM Left 06/15/2020   Procedure: LOWER EXTREMITY ANGIOGRAM;  Surgeon: Maeola Harman, MD;  Location: [facility] OR;  Service: Vascular;  Laterality: Left;  . PATCH ANGIOPLASTY Right 05/10/2020   Procedure: PATCH ANGIOPLASTY USING Kathleen Lime;  Surgeon: Leonie Douglas, MD;  Location: [facility] OR;  Service: Vascular;  Laterality: Right;  . PERIPHERAL VASCULAR BALLOON ANGIOPLASTY Left 04/28/2020   Procedure: PERIPHERAL VASCULAR BALLOON ANGIOPLASTY;  Surgeon: Sherren Kerns, MD;  Location: [facility] INVASIVE CV LAB;  Service: Cardiovascular;  Laterality: Left;  Unable to cross left iliac lesion to intervene  . REMOVAL OF GRAFT N/A 06/15/2020   Procedure: EXCISION OF FEMORAL-FEMORAL BYPASS,  EXCISION OF RIGHT FEMORAL-POPLITEAL BYPASS;  Surgeon: Maeola Harman, MD;  Location: [facility] OR;  Service: Vascular;  Laterality: N/A;  . THROMBECTOMY FEMORAL ARTERY N/A 06/01/2020   Procedure: THROMBECTOMY FEMORAL- FEMORAL BYPASS;  Surgeon: Leonie Douglas, MD;  Location: [facility] OR;  Service: Vascular;  Laterality: N/A;  . THROMBECTOMY FEMORAL ARTERY Left 06/15/2020   Procedure: LEFT LOWER EXTREMITY THROMBECTOMY;  Surgeon: Maeola Harman, MD;  Location: [facility] OR;  Service: Vascular;  Laterality: Left;  . THROMBECTOMY FEMORAL ARTERY Right 07/05/2020   Procedure: EXPLORATION OF RIGHT GROIN WITH REPAIR OF FEMORAL ARTERY;  Surgeon: Chuck Hint, MD;  Location: [facility] OR;  Service: Vascular;  Laterality: Right;  . THROMBECTOMY FEMORAL- FEMORAL BYPASS (N/A )  06/01/2020  . THROMBECTOMY OF BYPASS GRAFT FEMORAL- POPLITEAL ARTERY Left 06/01/2020   Procedure: THROMBECTOMY OF LEFT BYPASS GRAFT FEMORAL-POPLITEAL ARTERY;  Surgeon: Leonie Douglas, MD;  Location: [facility] OR;  Service: Vascular;  Laterality: Left;  . THROMBECTOMY OF LEFT BYPASS GRAFT FEMORAL-POPLITEAL ARTERY (Left )  06/01/2020  . VASCULAR SURGERY  04/2020  . VEIN HARVEST  Right 06/15/2020   Procedure: RIGHT GREATER SAPHENOUS VEIN HARVEST;  Surgeon: Maeola Harman, MD;  Location: [facility] OR;  Service: Vascular;  Laterality: Right;  Marland Kitchen VIDEO BRONCHOSCOPY N/A 05/23/2020   Procedure: VIDEO BRONCHOSCOPY WITH FLUORO;  Surgeon: Leslye Peer, MD;  Location: [facility] ENDOSCOPY;  Service: Cardiopulmonary;  Laterality: N/A;  . VIDEO BRONCHOSCOPY WITH ENDOBRONCHIAL NAVIGATION Right 06/06/2020   Procedure: VIDEO BRONCHOSCOPY WITH ENDOBRONCHIAL NAVIGATION;  Surgeon: Leslye Peer, MD;  Location: [facility] ENDOSCOPY;  Service: Pulmonary;  Laterality: Right;  . WOUND DEBRIDEMENT Right 06/14/2020   Procedure: RIGHT GROIN DEBRIDEMENT with pulsatile jet lavage of 11cm x 4cm x 4cm wound;  Surgeon: Maeola Harman, MD;  Location: [facility] OR;  Service: Vascular;  Laterality: Right;  . WOUND DEBRIDEMENT Bilateral 06/21/2020   Procedure: DEBRIDEMENT FOR GROIN WOUNDS;  Surgeon: Cephus Shelling, MD;  Location: [facility] OR;  Service: Vascular;  Laterality: Bilateral;     OB History   No obstetric history on file.     Family History  Problem Relation Age of Onset  . Peripheral Artery Disease Neg Hx     Social History  Tobacco Use  . Smoking status: Light Tobacco Smoker    Packs/day: 0.12    Years: 50.00    Pack years: 6.00    Types: Cigarettes  . Smokeless tobacco: Never Used  . Tobacco comment: 5 per a day  Vaping Use  . Vaping Use: Never used  Substance Use Topics  . Alcohol use: Not Currently  . Drug use: Never    Home Medications Prior to Admission medications   Medication Sig Start Date End Date Taking? Authorizing Provider  acetaminophen (TYLENOL) 500 MG tablet Take 1,500 mg by mouth 3 (three) times daily as needed for moderate pain or headache.    [provider]  atropine 1 % ophthalmic solution Place 2 drops under the tongue 4 (four) times daily as needed. 07/17/20   Rhyne, Ames Coupe, PA-C  bisacodyl (DULCOLAX) 10 MG suppository Place 1 suppository  (10 mg total) rectally daily as needed for moderate constipation. 07/17/20   Rhyne, Ames Coupe, PA-C  hydroxypropyl methylcellulose / hypromellose (ISOPTO TEARS / GONIOVISC) 2.5 % ophthalmic solution Place 1 drop into both eyes 3 (three) times daily as needed for dry eyes. 07/17/20   Rhyne, Ames Coupe, PA-C  hydrOXYzine (ATARAX/VISTARIL) 25 MG tablet Take 1 tablet (25 mg total) by mouth 3 (three) times daily as needed for anxiety. 07/17/20   Rhyne, Ames Coupe, PA-C  LORazepam (ATIVAN) 0.5 MG tablet Take 1 tablet (0.5 mg total) by mouth every 4 (four) hours as needed for anxiety. 07/17/20   Rhyne, Ames Coupe, PA-C  morphine 10 MG/5ML solution Take 2.5 mLs (5 mg total) by mouth every 4 (four) hours as needed for severe pain (dyspnea/pain). 07/17/20   Rhyne, Ames Coupe, PA-C  OLANZapine (ZYPREXA) 2.5 MG tablet Take 1 tablet (2.5 mg total) by mouth at bedtime. 07/17/20   Rhyne, Ames Coupe, PA-C  polyethylene glycol (MIRALAX / GLYCOLAX) 17 g packet Take 17 g by mouth daily. 07/17/20   Rhyne, Ames Coupe, PA-C  apixaban (ELIQUIS) 5 MG TABS tablet Take 1 tablet (5 mg total) by mouth 2 (two) times daily. 06/07/20 07/17/20  Lars Mage, PA-C    Allergies    Patient has no known allergies.  Review of Systems   Review of Systems  Unable to perform ROS: Mental status change    Physical Exam Updated Vital Signs BP (!) 149/103   Pulse 65   Temp 98.1 F (36.7 C) (Axillary)   Resp 19   SpO2 91%   Physical Exam Vitals and nursing note reviewed.  Constitutional:      Appearance: She is well-developed. She is not diaphoretic.  HENT:     Head: Normocephalic and atraumatic.     Right Ear: External ear normal.     Left Ear: External ear normal.     Nose: No rhinorrhea.  Eyes:     General: No scleral icterus.       Right eye: No discharge.        Left eye: No discharge.     Conjunctiva/sclera: Conjunctivae normal.     Pupils: Pupils are equal, round, and reactive to light.  Neck:     Trachea: No  tracheal deviation.  Cardiovascular:     Rate and Rhythm: Normal rate and regular rhythm.  Pulmonary:     Effort: Pulmonary effort is normal. No respiratory distress.     Breath sounds: Normal breath sounds. No stridor. No wheezing or rales.  Abdominal:     General: Bowel sounds are normal. There is no  distension.     Palpations: Abdomen is soft.     Tenderness: There is no abdominal tenderness. There is no guarding or rebound.  Musculoskeletal:     Cervical back: Neck supple.     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     Findings: No rash.  Neurological:     GCS: GCS eye subscore is 2. GCS verbal subscore is 1. GCS motor subscore is 5.     Cranial Nerves: No cranial nerve deficit ( Patient does not answer any questions, unable to get or comply with exam.  Eyes remain closed but she will open them during evaluation).     Motor: No abnormal muscle tone or seizure activity.     Comments: Patient is minimally responsive     ED Results / Procedures / Treatments   Labs (all labs ordered are listed, but only abnormal results are displayed) Labs Reviewed  COMPREHENSIVE METABOLIC PANEL - Abnormal; Notable for the following components:      Result Value   Potassium 3.3 (*)    Glucose, Bld 110 (*)    BUN 7 (*)    Creatinine, Ser 0.41 (*)    Calcium 8.4 (*)    Albumin 2.1 (*)    All other components within normal limits  CBC WITH DIFFERENTIAL/PLATELET - Abnormal; Notable for the following components:   RBC 3.54 (*)    Hemoglobin 9.3 (*)    HCT 29.9 (*)    All other components within normal limits  BLOOD GAS, VENOUS - Abnormal; Notable for the following components:    pO2, Ven 51.1 (*)    Bicarbonate 29.4 (*)    Acid-Base Excess 4.5 (*)    All other components within normal limits  URINALYSIS, ROUTINE W REFLEX MICROSCOPIC - Abnormal; Notable for the following components:   APPearance HAZY (*)    Hgb urine dipstick MODERATE (*)    Leukocytes,Ua LARGE (*)    WBC, UA >50 (*)    Bacteria, UA RARE (*)    Non Squamous Epithelial 0-5 (*)    All other components within normal limits  CBG MONITORING, ED - Abnormal; Notable for the following components:   Glucose-Capillary 112 (*)    All other components within normal limits  CULTURE, BLOOD (ROUTINE X 2)  CULTURE, BLOOD (ROUTINE X 2)  URINE CULTURE  SARS CORONAVIRUS 2 (TAT 6-24 HRS)  AMMONIA  LACTIC ACID, PLASMA  LACTIC ACID, PLASMA    EKG None  Radiology DG Chest 1 View  Result Date: 10/02/2020 CLINICAL DATA:  Altered mental status EXAM: CHEST  1 VIEW COMPARISON:  07/12/2020, CT 06/27/2020 FINDINGS: At least small left pleural effusion. Dense consolidation left lung base. Stable cardiomediastinal silhouette with aortic atherosclerosis. Mild central vascular congestion. No pneumothorax. IMPRESSION: At least small left pleural effusion with left basilar atelectasis or pneumonia. Vascular congestion Electronically Signed   By: Jasmine Pang M.D.   On: 10/02/2020 16:50   CT Head Wo Contrast  Result Date: 10/02/2020 CLINICAL DATA:  Altered mental status EXAM: CT HEAD WITHOUT CONTRAST TECHNIQUE: Contiguous axial images were obtained from the base of the skull through the vertex without intravenous contrast. COMPARISON:  None. FINDINGS: Brain: No evidence  of acute infarction, hemorrhage, hydrocephalus, extra-axial collection or mass lesion/mass effect. Vascular: Intracranial atherosclerosis. Skull: Normal. Negative for fracture or focal lesion. Sinuses/Orbits: The visualized paranasal sinuses are essentially clear. The mastoid air cells are unopacified. Other: None. IMPRESSION: Normal head CT. Electronically  Signed   By: Charline BillsSriyesh  Krishnan M.D.   On: 10/02/2020 18:55    Procedures Procedures   Medications Ordered in ED Medications  sodium chloride 0.9 % bolus 1,000 mL (0 mLs Intravenous Stopped 10/02/20 1942)    And  0.9 %  sodium chloride infusion (has no administration in time range)  cefTRIAXone (ROCEPHIN) 1 g in sodium chloride 0.9 % 100 mL IVPB (1 g Intravenous New Bag/Given 10/02/20 1942)    ED Course  I have reviewed the triage vital signs and the nursing notes.  Pertinent labs & imaging results that were available during my care of the patient were reviewed by me and considered in my medical decision making (see chart for details).  Clinical Course as of 10/02/20 1942  Mon Oct 02, 2020  1748 Hemoglobin stable at 9.3 [JK]  1909 Urinalysis is consistent with urinary tract infection [JK]  1909 Head CT without acute findings [JK]  1910 Chest x-ray small small pleural effusion.  Questionable possible pneumonia [JK]    Clinical Course User Index [JK] Linwood DibblesKnapp, Hilliard Borges, MD   MDM Rules/Calculators/A&P                          Patient is currently on hospice care for issues with severe vascular disease with progressive dry gangrene of the left lower extremity.  Patient's not a candidate for any further surgical procedures.  Patient's had a decline in her mental status.  Daughter is very aware of the patient's poor health.  She understands that ultimately there is nothing that can be done about her vascular issues and this will continue to worsen.    Questionable uti although could be related to chronic foley.  ?pna.  No signs of sepsis.  No acute findings on CT scan.  MS change may be multifactorial.  Daughter requests admission for IV abx.  Pt is DNR. Final Clinical Impression(s) / ED Diagnoses Final diagnoses:  Altered mental status, unspecified altered mental status type  Acute cystitis without hematuria      Linwood DibblesKnapp, Frank Pilger, MD 10/02/20 1942

## 2020-10-02 NOTE — Assessment & Plan Note (Signed)
Patient has a stage IV sacral decubitus ulcer.  There is visible bone.  It measures approximately 6 x 6 cm.  There is yellow/green slough.  This ulcer was present on admission.

## 2020-10-02 NOTE — Assessment & Plan Note (Signed)
Chronic. 

## 2020-10-02 NOTE — Progress Notes (Signed)
[facility])  [name] is our current hospice patient with a CTI of PAD with left lower extremity ischemia. Patient was sent to ED for AMS which is off from her baseline that is A&O x4. Family is aware of pt being sent to ED.  [facility] liaison will follow up in the morning and during hospitalization. Unsure plan of care at this time.   Please feel free to call with any hospice related questions or concerns.  Yolande Jolly, BSN, [employer] [phone]

## 2020-10-02 NOTE — Assessment & Plan Note (Signed)
Patient is a current hospice patient.

## 2020-10-02 NOTE — Progress Notes (Signed)
Femur protruding from necrotic stump

## 2020-10-02 NOTE — Subjective & Objective (Signed)
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  Daughter is at the bedside.  Her name is Melissa.  Daughter states that patient's been treated with 5 days of p.o. Cipro due to sediment that she noticed in her mother's Foley catheter.  Patient has home health care via hospice care.  Patient's mental status became altered on Saturday afternoon.  This continued through Sunday (Easter).  Discontinued today.  Patient has been awake for the last 48 hours according to the daughter.  Patient had a low-grade temperature of 100.4.  No nausea no vomiting.  Patient is 7 and drank very little due to altered mental status.  Daughter states the patient and has not slept at all.  Patient brought to the ER via EMS per request of the daughter.  Daughter states the home health nurse did see the patient today and suggest the patient go to the ER.  Work-up in the ER showed some mild hypokalemia.  Patient was afebrile.  No leukocytosis.  Head CT was negative for acute intracranial abnormalities.  Daughter was worried that a potential UTI could be causing the patient's altered mental status she did not want to leave this as the cause of her death.  Daughter understands the patient's left setting is quite short.  Upon examination of the patient, the patient has wet and dry gangrene of her left AKA stump.  There is exposed femur bone.  This smells extensively of wet gangrene.  Daughter was made aware that patient's life expectancy is short given the patient's foot gangrene.  Daughter still wants to try short course of antibiotics.  It was agreed upon with the daughter that we would try 3 doses of IV Rocephin.  If the patient's mentation did not improve after 3 doses of IV Rocephin, then we would pursue  inpatient hospice/comfort care.  Daughter confirms the patient is a DO NOT RESUSCITATE DO NOT INTUBATE.  Daughter does know about the patient's stage IV sacral decubitus ulcer.

## 2020-10-02 NOTE — ED Triage Notes (Addendum)
Patient BIB [facility] from home where she is on hospice care.  Patient has multiple wounds, sacral, and left AKA.  Hospice nurse called EMS due to patient having AMS and not communicating other than yes/no, patient's baseline is A&OX4. Patient has a chronic foley. EMS placed 20g left AC.  Vitals were 122/87 60-HR 99% room air 119-CBG

## 2020-10-02 NOTE — Assessment & Plan Note (Signed)
Chronic-stable.

## 2020-10-02 NOTE — ED Notes (Signed)
ED TO INPATIENT HANDOFF REPORT  ED Nurse Name and Phone #: [phone], Charise Carwin, RN   S Name/Age/Gender Holly Stevenson 70 y.o. female Room/Bed: WA05/WA05  Code Status   Code Status: DNR  Home/SNF/Other Home Patient oriented to: self and place Is this baseline? No   Triage Complete: Triage complete  Chief Complaint Altered mental status [R41.82]  Triage Note Patient BIB [facility] from home where she is on hospice care.  Patient has multiple wounds, sacral, and left AKA.  Hospice nurse called EMS due to patient having AMS and not communicating other than yes/no, patient's baseline is A&OX4. Patient has a chronic foley. EMS placed 20g left AC.  Vitals were 122/87 60-HR 99% room air 119-CBG    Allergies No Known Allergies  Level of Care/Admitting Diagnosis ED Disposition    ED Disposition Condition Comment   Admit  Hospital Area: [facility] [[id number]]  Level of Care: Med-Surg [16]  May admit patient to [facility] if equivalent level of care is available:: Yes  Covid Evaluation: Confirmed COVID Negative  Date Laboratory Confirmed COVID Negative: 10/02/2020  Diagnosis: Altered mental status [780.97.ICD-9-CM]  Admitting Physician: Imogene Burn, ERIC [[id number]]  Attending Physician: Imogene Burn, ERIC [[id number]]  Estimated length of stay: 3 - 4 days  Certification:: I certify this patient will need inpatient services for at least 2 midnights       B Medical/Surgery History Past Medical History:  Diagnosis Date  . Diabetes mellitus without complication (HCC)    Type II  . High cholesterol   . Hypertension   . Pneumonia    Past Surgical History:  Procedure Laterality Date  . ABDOMINAL AORTOGRAM W/LOWER EXTREMITY Bilateral 04/28/2020   Procedure: ABDOMINAL AORTOGRAM W/LOWER EXTREMITY;  Surgeon: Sherren Kerns, MD;  Location: [facility] INVASIVE CV LAB;  Service: Cardiovascular;  Laterality: Bilateral;  . AMPUTATION Left 06/18/2020   Procedure: LEFT ABOVE  KNEE AMPUTATION;  Surgeon: Sherren Kerns, MD;  Location: [facility] OR;  Service: Vascular;  Laterality: Left;  . ANGIOPLASTY N/A 06/15/2020   Procedure: VEIN PATCH ANGIOPLASTY LEFT COMMON, EXTERNAL ILIAC, and COMMON FEMORAL ARTERY;  Surgeon: Maeola Harman, MD;  Location: [facility] OR;  Service: Vascular;  Laterality: N/A;  . AORTOGRAM N/A 06/15/2020   Procedure: AORTOGRAM;  Surgeon: Maeola Harman, MD;  Location: [facility] OR;  Service: Vascular;  Laterality: N/A;  . AORTOGRAM N/A 06/15/2020   Procedure: AORTOGRAM;  Surgeon: Maeola Harman, MD;  Location: [facility] OR;  Service: Vascular;  Laterality: N/A;  . APPLICATION OF WOUND VAC Bilateral 06/18/2020   Procedure: EXCHANGE OF WOUND VAC;  Surgeon: Sherren Kerns, MD;  [facility];  Service: Vascular;  Laterality: Bilateral;  . APPLICATION OF WOUND VAC Bilateral 06/15/2020   Procedure: APPLICATION OF WOUND VAC BILATERAL GROINS;  Surgeon: Maeola Harman, MD;  Location[facility] OR;  Service: Vascular;  Laterality: Bilateral;  . APPLICATION OF WOUND VAC Bilateral 06/15/2020   Procedure: APPLICATION OF WOUND VAC BILATERAL GROINS;  Surgeon: Maeola Harman, MD;  Location: [facility] OR;  Service: Vascular;  Laterality: Bilateral;  . APPLICATION OF WOUND VAC Bilateral 06/21/2020   Procedure: WOUND VAC CHANGE;  Surgeon: Cephus Shelling, MD;  Location: [facility] OR;  Service: Vascular;  Laterality: Bilateral;  . BELOW KNEE LEG AMPUTATION Left 06/18/2020  . BRONCHIAL BIOPSY  05/23/2020   Procedure: BRONCHIAL BIOPSIES;  Surgeon: Leslye Peer, MD;  Location: [facility] ENDOSCOPY;  Service: Cardiopulmonary;;  . BRONCHIAL BIOPSY  06/06/2020   Procedure: BRONCHIAL  BIOPSIES;  Surgeon: Leslye PeerByrum, Robert S, MD;  [facility];  Service: Pulmonary;;  . BRONCHIAL BRUSHINGS  05/23/2020   Procedure: BRONCHIAL BRUSHINGS;  Surgeon: Leslye PeerByrum, Robert S, MD;  [facility];  Service: Cardiopulmonary;;  . BRONCHIAL BRUSHINGS  06/06/2020    Procedure: BRONCHIAL BRUSHINGS;  Surgeon: Leslye PeerByrum, Robert S, MD;  [facility];  Service: Pulmonary;;  . BRONCHIAL NEEDLE ASPIRATION BIOPSY  05/23/2020   Procedure: BRONCHIAL NEEDLE ASPIRATION BIOPSIES;  Surgeon: Leslye PeerByrum, Robert S, MD;  [facility];  Service: Cardiopulmonary;;  . BRONCHIAL NEEDLE ASPIRATION BIOPSY  06/06/2020   Procedure: BRONCHIAL NEEDLE ASPIRATION BIOPSIES;  Surgeon: Leslye PeerByrum, Robert S, MD;  [facility];  Service: Pulmonary;;  . BRONCHIAL WASHINGS  05/23/2020   Procedure: BRONCHIAL WASHINGS;  Surgeon: Leslye PeerByrum, Robert S, MD;  [facility];  Service: Cardiopulmonary;;  . BRONCHIAL WASHINGS  06/06/2020   Procedure: BRONCHIAL WASHINGS;  Surgeon: Leslye PeerByrum, Robert S, MD;  [facility];  Service: Pulmonary;;  . COLONOSCOPY W/ BIOPSIES AND POLYPECTOMY    . ELECTROMAGNETIC NAVIGATION BROCHOSCOPY N/A 05/23/2020   Procedure: ELECTROMAGNETIC NAVIGATION BRONCHOSCOPY;  Surgeon: Leslye PeerByrum, Robert S, MD;  [facility];  Service: Cardiopulmonary;  Laterality: N/A;  . ENDARTERECTOMY FEMORAL Right 05/10/2020   Procedure: ENDARTERECTOMY RIGHT FEMORAL;  Surgeon: Leonie DouglasHawken, Thomas N, MD;  [facility];  Service: Vascular;  Laterality: Right;  . ENDARTERECTOMY FEMORAL Bilateral 06/15/2020   Procedure: BILATERAL FEMORAL ENDARTERECTOMY WITH PATCH ANGIOPLASTY RIGHT FERMORAL;  Surgeon: Maeola Harmanain, Brandon Christopher, MD;  [facility];  Service: Vascular;  Laterality: Bilateral;  . FEMORAL-FEMORAL BYPASS GRAFT Bilateral 05/10/2020   Procedure: RIGHT TO LEFT FEMORAL-FEMORAL ARTERY BYPASS GRAFT;  Surgeon: Leonie DouglasHawken, Thomas N, MD;  [facility];  Service: Vascular;  Laterality: Bilateral;  . FEMORAL-FEMORAL BYPASS GRAFT Right 06/01/2020   Procedure: INTERPOSITION GRAFT RIGHT COMMON FEMORAL TO FEMORAL-FEMORAL BYPASS;  Surgeon: Leonie DouglasHawken, Thomas N, MD;  [facility];  Service: Vascular;  Laterality: Right;  . FEMORAL-FEMORAL BYPASS GRAFT Right 06/14/2020   Procedure:  interposition FEMORAL-FEMORAL ARTERY bypass using 6mm graft;  Surgeon: Maeola Harmanain, Brandon Christopher, MD;  [facility];  Service: Vascular;  Laterality: Right;  . FEMORAL-POPLITEAL BYPASS GRAFT Left 05/10/2020   Procedure: LEFT FEMORAL-POPLITEAL ARTERY BYPASS GRAFT;  Surgeon: Leonie DouglasHawken, Thomas N, MD;  [facility];  Service: Vascular;  Laterality: Left;  . FEMORAL-POPLITEAL BYPASS GRAFT Left 06/15/2020   Procedure: BYPASS GRAFT FEMORAL TO ABOVE KNEE POPLITEAL ARTERY USING CYRO SAPHENOUS VEIN;  Surgeon: Maeola Harmanain, Brandon Christopher, MD;  [facility];  Service: Vascular;  Laterality: Left;  . GROIN DISSECTION Right 06/01/2020   Procedure: RIGHT GROIN EXPLORATION;  Surgeon: Leonie DouglasHawken, Thomas N, MD;  [facility];  Service: Vascular;  Laterality: Right;  . INSERTION OF ILIAC STENT Bilateral 06/15/2020   Procedure: INSERTION OF BILATERAL COMMON ILIAC STENTS, INSERTION OF LEFT EXTERNAL ILIAC STENT;  Surgeon: Maeola Harmanain, Brandon Christopher, MD;  [facility];  Service: Vascular;  Laterality: Bilateral;  . INTRAOPERATIVE ARTERIOGRAM Left 06/01/2020   Procedure: LEFT LOWER EXTREMITY  ARTERIOGRAM;  Surgeon: Leonie DouglasHawken, Thomas N, MD;  [facility];  Service: Vascular;  Laterality: Left;  . LOWER EXTREMITY ANGIOGRAM Left 05/10/2020   Procedure: LEFT LOWER EXTREMITY ANGIOGRAM;  Surgeon: Leonie DouglasHawken, Thomas N, MD;  [facility];  Service: Vascular;  Laterality: Left;  . LOWER EXTREMITY ANGIOGRAM Left 06/15/2020   Procedure: ANGIOGRAM SUPERFICIAL FEMORAL ARTERY x 4 WITH LEFT COMMON ILLIAC STENT AND STENTING LEFT LOWER EXTREMITY;  Surgeon: Maeola Harmanain, Brandon Christopher, MD;  [facility];  Service: Vascular;  Laterality: Left;  . LOWER EXTREMITY ANGIOGRAM Left 06/15/2020   Procedure: LOWER EXTREMITY ANGIOGRAM;  Surgeon: Maeola Harman, MD;  Location: [facility] OR;  Service: Vascular;  Laterality: Left;  . PATCH ANGIOPLASTY Right 05/10/2020   Procedure: PATCH ANGIOPLASTY USING Kathleen Lime;  Surgeon: Leonie Douglas, MD;  Location: [facility] OR;  Service: Vascular;  Laterality: Right;  . PERIPHERAL VASCULAR BALLOON ANGIOPLASTY Left 04/28/2020   Procedure: PERIPHERAL VASCULAR BALLOON ANGIOPLASTY;  Surgeon: Sherren Kerns, MD;  [facility];  Service: Cardiovascular;  Laterality: Left;  Unable to cross left iliac lesion to intervene  . REMOVAL OF GRAFT N/A 06/15/2020   Procedure: EXCISION OF FEMORAL-FEMORAL BYPASS,  EXCISION OF RIGHT FEMORAL-POPLITEAL BYPASS;  Surgeon: Maeola Harman, MD;  Location: [facility] OR;  Service: Vascular;  Laterality: N/A;  . THROMBECTOMY FEMORAL ARTERY N/A 06/01/2020   Procedure: THROMBECTOMY FEMORAL- FEMORAL BYPASS;  Surgeon: Leonie Douglas, MD;  [facility];  Service: Vascular;  Laterality: N/A;  . THROMBECTOMY FEMORAL ARTERY Left 06/15/2020   Procedure: LEFT LOWER EXTREMITY THROMBECTOMY;  Surgeon: Maeola Harman, MD;  [facility];  Service: Vascular;  Laterality: Left;  . THROMBECTOMY FEMORAL ARTERY Right 07/05/2020   Procedure: EXPLORATION OF RIGHT GROIN WITH REPAIR OF FEMORAL ARTERY;  Surgeon: Chuck Hint, MD;  Location[facility] OR;  Service: Vascular;  Laterality: Right;  . THROMBECTOMY FEMORAL- FEMORAL BYPASS (N/A )  06/01/2020  . THROMBECTOMY OF BYPASS GRAFT FEMORAL- POPLITEAL ARTERY Left 06/01/2020   Procedure: THROMBECTOMY OF LEFT BYPASS GRAFT FEMORAL-POPLITEAL ARTERY;  Surgeon: Leonie Douglas, MD;  Location: [facility] OR;  Service: Vascular;  Laterality: Left;  . THROMBECTOMY OF LEFT BYPASS GRAFT FEMORAL-POPLITEAL ARTERY (Left )  06/01/2020  . VASCULAR SURGERY  04/2020  . VEIN HARVEST Right 06/15/2020   Procedure: RIGHT GREATER SAPHENOUS VEIN HARVEST;  Surgeon: Maeola Harman, MD;  Location: [facility] OR;  Service: Vascular;  Laterality: Right;  Marland Kitchen VIDEO BRONCHOSCOPY N/A 05/23/2020   Procedure: VIDEO BRONCHOSCOPY WITH FLUORO;  Surgeon: Leslye Peer, MD;  Location: [facility] ENDOSCOPY;  Service: Cardiopulmonary;  Laterality: N/A;   . VIDEO BRONCHOSCOPY WITH ENDOBRONCHIAL NAVIGATION Right 06/06/2020   Procedure: VIDEO BRONCHOSCOPY WITH ENDOBRONCHIAL NAVIGATION;  Surgeon: Leslye Peer, MD;  [facility] ENDOS[facility];  Service: Pulmonary;  Laterality: Right;  . WOUND DEBRIDEMENT Right 06/14/2020   Procedure: RIGHT GROIN DEBRIDEMENT with pulsatile jet lavage of 11cm x 4cm x 4cm wound;  Surgeon: Maeola Harman, MD;  Location: [facility] OR;  Service: Vascular;  Laterality: Right;  . WOUND DEBRIDEMENT Bilateral 06/21/2020   Procedure: DEBRIDEMENT FOR GROIN WOUNDS;  Surgeon: Cephus Shelling, MD;  Location: [facility] OR;  Service: Vascular;  Laterality: Bilateral;     A IV Location/Drains/Wounds Patient Lines/Drains/Airways Status    Active Line/Drains/Airways    Name Placement date Placement time Site Days   Peripheral IV 10/02/20 Anterior;Left;Proximal Forearm 10/02/20  --  Forearm  less than 1   Peripheral IV 10/02/20 Left Wrist 10/02/20  1728  Wrist  less than 1   Urethral Catheter Angela tincher RN Non-latex;Straight-tip 16 Fr. 06/30/20  1822  Non-latex;Straight-tip  94   Urethral Catheter Kristen, RN Non-latex 14 Fr. 07/17/20  1416  Non-latex  77   Incision (Closed) 06/15/20 Leg Right 06/15/20  1918  -- 109   Incision (Closed) 06/15/20 Ankle Right 06/15/20  1937  -- 109   Incision (Closed) 06/15/20 Thigh Right 06/15/20  2002  -- 109   Incision (Closed) 07/05/20 Groin  Right 07/05/20  0844  -- 89   Incision (Closed) 07/05/20 Groin Left 07/05/20  0844  -- 89   Pressure Injury 06/28/20 Sacrum Mid;Lower Stage 2 -  Partial thickness loss of dermis presenting as a shallow open injury with a red, pink wound bed without slough. 06/28/20  1231  -- 96          Intake/Output Last 24 hours  Intake/Output Summary (Last 24 hours) at 10/02/2020 2114 Last data filed at 10/02/2020 2012 Gross per 24 hour  Intake 1100 ml  Output --  Net 1100 ml    Labs/Imaging Results for orders placed or performed during the hospital encounter  of 10/02/20 (from the past 48 hour(s))  Urinalysis, Routine w reflex microscopic     Status: Abnormal   Collection Time: 10/02/20  4:49 PM  Result Value Ref Range   Color, Urine YELLOW YELLOW   APPearance HAZY (A) CLEAR   Specific Gravity, Urine 1.010 1.005 - 1.030   pH 7.0 5.0 - 8.0   Glucose, UA NEGATIVE NEGATIVE mg/dL   Hgb urine dipstick MODERATE (A) NEGATIVE   Bilirubin Urine NEGATIVE NEGATIVE   Ketones, ur NEGATIVE NEGATIVE mg/dL   Protein, ur NEGATIVE NEGATIVE mg/dL   Nitrite NEGATIVE NEGATIVE   Leukocytes,Ua LARGE (A) NEGATIVE   RBC / HPF 6-10 0 - 5 RBC/hpf   WBC, UA >50 (H) 0 - 5 WBC/hpf   Bacteria, UA RARE (A) NONE SEEN   Squamous Epithelial / LPF 0-5 0 - 5   WBC Clumps PRESENT    Mucus PRESENT    Budding Yeast PRESENT    Ca Oxalate Crys, UA PRESENT    Non Squamous Epithelial 0-5 (A) NONE SEEN    Comment: Performed at [facility], [address], [place], [place] [address]  Blood gas, venous     Status: Abnormal   Collection Time: 10/02/20  5:00 PM  Result Value Ref Range   pH, Ven 7.407 7.250 - 7.430   pCO2, Ven 47.6 44.0 - 60.0 mmHg   pO2, Ven 51.1 (H) 32.0 - 45.0 mmHg   Bicarbonate 29.4 (H) 20.0 - 28.0 mmol/L   Acid-Base Excess 4.5 (H) 0.0 - 2.0 mmol/L   O2 Saturation 82.5 %   Patient temperature 98.6     Comment: Performed at [facility], [address], [place], [place] [address]  Comprehensive metabolic panel     Status: Abnormal   Collection Time: 10/02/20  5:07 PM  Result Value Ref Range   Sodium 136 135 - 145 mmol/L   Potassium 3.3 (L) 3.5 - 5.1 mmol/L   Chloride 100 98 - 111 mmol/L   CO2 27 22 - 32 mmol/L   Glucose, Bld 110 (H) 70 - 99 mg/dL    Comment: Glucose reference range applies only to samples taken after fasting for at least 8 hours.   BUN 7 (L) 8 - 23 mg/dL   Creatinine, Ser 4.31 (L) 0.44 - 1.00 mg/dL   Calcium 8.4 (L) 8.9 - 10.3 mg/dL   Total Protein 6.5 6.5 - 8.1 g/dL   Albumin 2.1 (L) 3.5 - 5.0  g/dL   AST 16 15 - 41 U/L   ALT 14 0 - 44 U/L   Alkaline Phosphatase 67 38 - 126 U/L   Total Bilirubin 0.8 0.3 - 1.2 mg/dL   GFR, Estimated >54 >00 mL/min    Comment: (NOTE) Calculated using the CKD-EPI Creatinine Equation (2021)    Anion gap 9 5 -  15    Comment: Performed at [facility], [address] [place], [place] [address]  CBC WITH DIFFERENTIAL     Status: Abnormal   Collection Time: 10/02/20  5:07 PM  Result Value Ref Range   WBC 7.8 4.0 - 10.5 K/uL   RBC 3.54 (L) 3.87 - 5.11 MIL/uL   Hemoglobin 9.3 (L) 12.0 - 15.0 g/dL   HCT 46.6 (L) 59.9 - 35.7 %   MCV 84.5 80.0 - 100.0 fL   MCH 26.3 26.0 - 34.0 pg   MCHC 31.1 30.0 - 36.0 g/dL   RDW 01.7 79.3 - 90.3 %   Platelets 251 150 - 400 K/uL   nRBC 0.0 0.0 - 0.2 %   Neutrophils Relative % 76 %   Neutro Abs 6.0 1.7 - 7.7 K/uL   Lymphocytes Relative 13 %   Lymphs Abs 1.0 0.7 - 4.0 K/uL   Monocytes Relative 9 %   Monocytes Absolute 0.7 0.1 - 1.0 K/uL   Eosinophils Relative 1 %   Eosinophils Absolute 0.1 0.0 - 0.5 K/uL   Basophils Relative 0 %   Basophils Absolute 0.0 0.0 - 0.1 K/uL   Immature Granulocytes 1 %   Abs Immature Granulocytes 0.04 0.00 - 0.07 K/uL    Comment: Performed at [facility], [address], [place], [place] [address]  Ammonia     Status: None   Collection Time: 10/02/20  5:07 PM  Result Value Ref Range   Ammonia 24 9 - 35 umol/L    Comment: Performed at [facility], [address] [place], [place] [address]  Lactic acid, plasma     Status: None   Collection Time: 10/02/20  5:07 PM  Result Value Ref Range   Lactic Acid, Venous 0.9 0.5 - 1.9 mmol/L    Comment: Performed at [facility], [address] [place], [place] [address]  CBG monitoring, ED     Status: Abnormal   Collection Time: 10/02/20  5:26 PM  Result Value Ref Range   Glucose-Capillary 112 (H) 70 - 99 mg/dL    Comment: Glucose reference range applies only  to samples taken after fasting for at least 8 hours.  Lactic acid, plasma     Status: None   Collection Time: 10/02/20  7:43 PM  Result Value Ref Range   Lactic Acid, Venous 0.8 0.5 - 1.9 mmol/L    Comment: Performed at [facility], [address] [place], [place] [address]   DG Chest 1 View  Result Date: 10/02/2020 CLINICAL DATA:  Altered mental status EXAM: CHEST  1 VIEW COMPARISON:  07/12/2020, CT 06/27/2020 FINDINGS: At least small left pleural effusion. Dense consolidation left lung base. Stable cardiomediastinal silhouette with aortic atherosclerosis. Mild central vascular congestion. No pneumothorax. IMPRESSION: At least small left pleural effusion with left basilar atelectasis or pneumonia. Vascular congestion Electronically Signed   By: Jasmine Pang M.D.   On: 10/02/2020 16:50   CT Head Wo Contrast  Result Date: 10/02/2020 CLINICAL DATA:  Altered mental status EXAM: CT HEAD WITHOUT CONTRAST TECHNIQUE: Contiguous axial images were obtained from the base of the skull through the vertex without intravenous contrast. COMPARISON:  None. FINDINGS: Brain: No evidence of acute infarction, hemorrhage, hydrocephalus, extra-axial collection or mass lesion/mass effect. Vascular: Intracranial atherosclerosis. Skull: Normal. Negative for fracture or focal lesion. Sinuses/Orbits: The visualized paranasal sinuses are essentially clear. The mastoid air cells are unopacified. Other: None. IMPRESSION: Normal head CT. Electronically Signed  By: Charline Bills M.D.   On: 10/02/2020 18:55    Pending Labs Unresulted Labs (From admission, onward)          Start     Ordered   10/09/20 0500  Creatinine, serum  (enoxaparin (LOVENOX)    CrCl >/= 30 ml/min)  Weekly,   R     Comments: while on enoxaparin therapy    10/02/20 2102   10/03/20 0500  CBC with Differential/Platelet  Tomorrow morning,   R        10/02/20 2102   10/03/20 0500  Comprehensive metabolic panel  Tomorrow morning,    R        10/02/20 2102   10/02/20 2103  CBC  (enoxaparin (LOVENOX)    CrCl >/= 30 ml/min)  Once,   STAT       Comments: Baseline for enoxaparin therapy IF NOT ALREADY DRAWN.  Notify MD if PLT < 100 K.    10/02/20 2102   10/02/20 2103  Creatinine, serum  (enoxaparin (LOVENOX)    CrCl >/= 30 ml/min)  Once,   STAT       Comments: Baseline for enoxaparin therapy IF NOT ALREADY DRAWN.    10/02/20 2102   10/02/20 2103  Hemoglobin A1c  Once,   STAT       Comments: To assess prior glycemic control    10/02/20 2102   10/02/20 1932  SARS CORONAVIRUS 2 (TAT 6-24 HRS) Nasopharyngeal Nasopharyngeal Swab  (Tier 3 - Symptomatic/asymptomatic)  Once,   STAT       Question Answer Comment  Is this test for diagnosis or screening Screening   Symptomatic for COVID-19 as defined by CDC No   Hospitalized for COVID-19 No   Admitted to ICU for COVID-19 No   Previously tested for COVID-19 Yes   Resident in a congregate (group) care setting No   Employed in healthcare setting No   Pregnant No   Has patient completed COVID vaccination(s) (2 doses of Pfizer/Moderna 1 dose of [employer]) Unknown      10/02/20 1931   10/02/20 1626  Blood Cultures (routine x 2)  BLOOD CULTURE X 2,   STAT      10/02/20 1625   10/02/20 1626  Urine culture  ONCE - STAT,   STAT        10/02/20 1625          Vitals/Pain Today's Vitals   10/02/20 1845 10/02/20 1900 10/02/20 1930 10/02/20 2000  BP: (!) 149/103  (!) 144/115 (!) 128/50  Pulse: 64 65 66 60  Resp: 16 19 15 18   Temp:      TempSrc:      SpO2: 93% 91% 93% 92%    Isolation Precautions No active isolations  Medications Medications  sodium chloride 0.9 % bolus 1,000 mL (0 mLs Intravenous Stopped 10/02/20 1942)    And  0.9 %  sodium chloride infusion ( Intravenous New Bag/Given 10/02/20 2056)  morphine 10 MG/5ML solution 5 mg (has no administration in time range)  enoxaparin (LOVENOX) injection 40 mg (has no administration in time range)  acetaminophen  (TYLENOL) tablet 650 mg (has no administration in time range)    Or  acetaminophen (TYLENOL) suppository 650 mg (has no administration in time range)  insulin aspart (novoLOG) injection 0-9 Units (has no administration in time range)  cefTRIAXone (ROCEPHIN) 1 g in sodium chloride 0.9 % 100 mL IVPB (has no administration in time range)  cefTRIAXone (ROCEPHIN) 1 g in  sodium chloride 0.9 % 100 mL IVPB (0 g Intravenous Stopped 10/02/20 2012)    Mobility non-ambulatory High fall risk   Focused Assessments    R Recommendations: See Admitting Provider Note  Report given to:   Additional Notes:

## 2020-10-02 NOTE — Assessment & Plan Note (Signed)
Admit to medical bed.  I doubt the patient's altered mental status is due to UTI.  Patient has a chronic indwelling Foley and has been on 5 days of p.o. Cipro.  I think the daughter wants to make sure that the urinary tract infection is not the cause of the patient's ultimate demise.  I think that 3 doses of IV Rocephin will convince the daughter that the urinary tract infection is not the cause of the patient's altered mental status.  I have asked the daughter to contact their hospice agency and inquire about inpatient hospice options.

## 2020-10-02 NOTE — Assessment & Plan Note (Signed)
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  See pictures.  Not sure that wound care will be able to do anything.

## 2020-10-02 NOTE — H&P (Signed)
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  Daughter is at the bedside.  Her name is Melissa.  Daughter states that patient's been treated with 5 days of p.o. Cipro due to sediment that she noticed in her mother's Foley catheter.  Patient has home health care via hospice care.  Patient's mental status became altered on Saturday afternoon.  This continued through Sunday (Easter).  Discontinued today.  Patient has been awake for the last 48 hours according to the daughter.  Patient had a low-grade temperature of 100.4.  No nausea no vomiting.  Patient is 7 and drank very little due to altered mental status.  Daughter states the patient and has not slept at all.  Patient brought to the ER via EMS per request of the daughter.  Daughter states the home health nurse did see the patient today and suggest the patient go to the ER.  Work-up in the ER showed some mild hypokalemia.  Patient was afebrile.  No leukocytosis.  Head CT was negative for acute intracranial abnormalities.  Daughter was worried that a potential UTI could be causing the patient's altered mental status she did not want to leave this as the cause of her death.  Daughter understands the patient's left setting is quite short.  Upon examination of the patient, the patient has wet and dry gangrene of her left AKA stump.  There is exposed femur bone.  This smells extensively of wet gangrene.  Daughter was made aware that patient's life expectancy is short given the patient's foot  gangrene.  Daughter still wants to try short course of antibiotics.  It was agreed upon with the daughter that we would try 3 doses of IV Rocephin.  If the patient's mentation did not improve after 3 doses of IV Rocephin, then we would pursue inpatient hospice/comfort care.  Daughter confirms the patient is a DO NOT RESUSCITATE DO NOT INTUBATE.  Daughter does know about the patient's stage IV sacral decubitus ulcer.    ED Course: labs and imaging performed. Started on IV Rocephin. First dose given in ER.  Review of Systems:  Review of Systems  Unable to perform ROS: Mental status change    Past Medical History:  Diagnosis Date  . Diabetes mellitus without complication (HCC)    Type II  . High cholesterol   . Hypertension   . Pneumonia     Past Surgical History:  Procedure Laterality Date  . ABDOMINAL AORTOGRAM W/LOWER EXTREMITY Bilateral 04/28/2020   Procedure: ABDOMINAL AORTOGRAM W/LOWER EXTREMITY;  Surgeon: Sherren Kerns, MD;  Location: [facility] INVASIVE CV LAB;  Service: Cardiovascular;  Laterality: Bilateral;  . AMPUTATION Left 06/18/2020   Procedure: LEFT ABOVE KNEE AMPUTATION;  Surgeon: Sherren Kerns, MD;  Location: [facility] OR;  Service: Vascular;  Laterality: Left;  . ANGIOPLASTY N/A 06/15/2020   Procedure: VEIN PATCH ANGIOPLASTY LEFT COMMON, EXTERNAL ILIAC, and COMMON FEMORAL ARTERY;  Surgeon: Maeola Harman, MD;  Location: [facility] OR;  Service: Vascular;  Laterality: N/A;  . AORTOGRAM N/A 06/15/2020   Procedure: AORTOGRAM;  Surgeon: Lemar Livings  Cristal Deerhristopher, MD;  [facility];  Service: Vascular;  Laterality: N/A;  . AORTOGRAM N/A 06/15/2020   Procedure: AORTOGRAM;  Surgeon: Maeola Harmanain, Brandon Christopher, MD;  [facility];  Service: Vascular;  Laterality: N/A;  . APPLICATION OF WOUND VAC Bilateral 06/18/2020   Procedure: EXCHANGE OF WOUND VAC;  Surgeon: Sherren KernsFields, Charles E, MD;  [facility] OR;  Service: Vascular;  Laterality: Bilateral;  . APPLICATION OF WOUND VAC  Bilateral 06/15/2020   Procedure: APPLICATION OF WOUND VAC BILATERAL GROINS;  Surgeon: Maeola Harmanain, Brandon Christopher, MD;  [facility];  Service: Vascular;  Laterality: Bilateral;  . APPLICATION OF WOUND VAC Bilateral 06/15/2020   Procedure: APPLICATION OF WOUND VAC BILATERAL GROINS;  Surgeon: Maeola Harmanain, Brandon Christopher, MD;  [facility];  Service: Vascular;  Laterality: Bilateral;  . APPLICATION OF WOUND VAC Bilateral 06/21/2020   Procedure: WOUND VAC CHANGE;  Surgeon: Cephus Shellinglark, Christopher J, MD;  [facility];  Service: Vascular;  Laterality: Bilateral;  . BELOW KNEE LEG AMPUTATION Left 06/18/2020  . BRONCHIAL BIOPSY  05/23/2020   Procedure: BRONCHIAL BIOPSIES;  Surgeon: Leslye PeerByrum, Robert S, MD;  [facility];  Service: Cardiopulmonary;;  . BRONCHIAL BIOPSY  06/06/2020   Procedure: BRONCHIAL BIOPSIES;  Surgeon: Leslye PeerByrum, Robert S, MD;  [facility];  Service: Pulmonary;;  . BRONCHIAL BRUSHINGS  05/23/2020   Procedure: BRONCHIAL BRUSHINGS;  Surgeon: Leslye PeerByrum, Robert S, MD;  [facility];  Service: Cardiopulmonary;;  . BRONCHIAL BRUSHINGS  06/06/2020   Procedure: BRONCHIAL BRUSHINGS;  Surgeon: Leslye PeerByrum, Robert S, MD;  [facility];  Service: Pulmonary;;  . BRONCHIAL NEEDLE ASPIRATION BIOPSY  05/23/2020   Procedure: BRONCHIAL NEEDLE ASPIRATION BIOPSIES;  Surgeon: Leslye PeerByrum, Robert S, MD;  [facility];  Service: Cardiopulmonary;;  . BRONCHIAL NEEDLE ASPIRATION BIOPSY  06/06/2020   Procedure: BRONCHIAL NEEDLE ASPIRATION BIOPSIES;  Surgeon: Leslye PeerByrum, Robert S, MD;  [facility];  Service: Pulmonary;;  . BRONCHIAL WASHINGS  05/23/2020   Procedure: BRONCHIAL WASHINGS;  Surgeon: Leslye PeerByrum, Robert S, MD;  [facility];  Service: Cardiopulmonary;;  . BRONCHIAL WASHINGS  06/06/2020   Procedure: BRONCHIAL WASHINGS;  Surgeon: Leslye PeerByrum, Robert S, MD;  [facility];  Service: Pulmonary;;  . COLONOSCOPY W/ BIOPSIES AND POLYPECTOMY    . ELECTROMAGNETIC  NAVIGATION BROCHOSCOPY N/A 05/23/2020   Procedure: ELECTROMAGNETIC NAVIGATION BRONCHOSCOPY;  Surgeon: Leslye PeerByrum, Robert S, MD;  [facility];  Service: Cardiopulmonary;  Laterality: N/A;  . ENDARTERECTOMY FEMORAL Right 05/10/2020   Procedure: ENDARTERECTOMY RIGHT FEMORAL;  Surgeon: Leonie DouglasHawken, Thomas N, MD;  [facility];  Service: Vascular;  Laterality: Right;  . ENDARTERECTOMY FEMORAL Bilateral 06/15/2020   Procedure: BILATERAL FEMORAL ENDARTERECTOMY WITH PATCH ANGIOPLASTY RIGHT FERMORAL;  Surgeon: Maeola Harmanain, Brandon Christopher, MD;  [facility];  Service: Vascular;  Laterality: Bilateral;  . FEMORAL-FEMORAL BYPASS GRAFT Bilateral 05/10/2020   Procedure: RIGHT TO LEFT FEMORAL-FEMORAL ARTERY BYPASS GRAFT;  Surgeon: Leonie DouglasHawken, Thomas N, MD;  [facility];  Service: Vascular;  Laterality: Bilateral;  . FEMORAL-FEMORAL BYPASS GRAFT Right 06/01/2020   Procedure: INTERPOSITION GRAFT RIGHT COMMON FEMORAL TO FEMORAL-FEMORAL BYPASS;  Surgeon: Leonie DouglasHawken, Thomas N, MD;  [facility];  Service: Vascular;  Laterality: Right;  . FEMORAL-FEMORAL BYPASS GRAFT Right 06/14/2020   Procedure: interposition FEMORAL-FEMORAL ARTERY bypass using 6mm graft;  Surgeon: Maeola Harmanain, Brandon Christopher, MD;  [facility];  Service: Vascular;  Laterality: Right;  . FEMORAL-POPLITEAL BYPASS GRAFT Left 05/10/2020   Procedure: LEFT FEMORAL-POPLITEAL ARTERY BYPASS GRAFT;  Surgeon: Leonie DouglasHawken, Thomas N, MD;  [facility];  Service:  Vascular;  Laterality: Left;  . FEMORAL-POPLITEAL BYPASS GRAFT Left 06/15/2020   Procedure: BYPASS GRAFT FEMORAL TO ABOVE KNEE POPLITEAL ARTERY USING CYRO SAPHENOUS VEIN;  Surgeon: Maeola Harman, MD;  Location: [facility] OR;  Service: Vascular;  Laterality: Left;  . GROIN DISSECTION Right 06/01/2020   Procedure: RIGHT GROIN EXPLORATION;  Surgeon: Leonie Douglas, MD;  Location: [facility] OR;  Service: Vascular;  Laterality: Right;  . INSERTION OF ILIAC STENT Bilateral 06/15/2020   Procedure: INSERTION  OF BILATERAL COMMON ILIAC STENTS, INSERTION OF LEFT EXTERNAL ILIAC STENT;  Surgeon: Maeola Harman, MD;  Location: [facility] OR;  Service: Vascular;  Laterality: Bilateral;  . INTRAOPERATIVE ARTERIOGRAM Left 06/01/2020   Procedure: LEFT LOWER EXTREMITY  ARTERIOGRAM;  Surgeon: Leonie Douglas, MD;  Location: [facility] OR;  Service: Vascular;  Laterality: Left;  . LOWER EXTREMITY ANGIOGRAM Left 05/10/2020   Procedure: LEFT LOWER EXTREMITY ANGIOGRAM;  Surgeon: Leonie Douglas, MD;  Location: [facility] OR;  Service: Vascular;  Laterality: Left;  . LOWER EXTREMITY ANGIOGRAM Left 06/15/2020   Procedure: ANGIOGRAM SUPERFICIAL FEMORAL ARTERY x 4 WITH LEFT COMMON ILLIAC STENT AND STENTING LEFT LOWER EXTREMITY;  Surgeon: Maeola Harman, MD;  Location: [facility] OR;  Service: Vascular;  Laterality: Left;  . LOWER EXTREMITY ANGIOGRAM Left 06/15/2020   Procedure: LOWER EXTREMITY ANGIOGRAM;  Surgeon: Maeola Harman, MD;  Location: [facility] OR;  Service: Vascular;  Laterality: Left;  . PATCH ANGIOPLASTY Right 05/10/2020   Procedure: PATCH ANGIOPLASTY USING Kathleen Lime;  Surgeon: Leonie Douglas, MD;  Location: [facility] OR;  Service: Vascular;  Laterality: Right;  . PERIPHERAL VASCULAR BALLOON ANGIOPLASTY Left 04/28/2020   Procedure: PERIPHERAL VASCULAR BALLOON ANGIOPLASTY;  Surgeon: Sherren Kerns, MD;  [facility];  Service: Cardiovascular;  Laterality: Left;  Unable to cross left iliac lesion to intervene  . REMOVAL OF GRAFT N/A 06/15/2020   Procedure: EXCISION OF FEMORAL-FEMORAL BYPASS,  EXCISION OF RIGHT FEMORAL-POPLITEAL BYPASS;  Surgeon: Maeola Harman, MD;  Location: [facility] OR;  Service: Vascular;  Laterality: N/A;  . THROMBECTOMY FEMORAL ARTERY N/A 06/01/2020   Procedure: THROMBECTOMY FEMORAL- FEMORAL BYPASS;  Surgeon: Leonie Douglas, MD;  [facility];  Service: Vascular;  Laterality: N/A;  . THROMBECTOMY FEMORAL ARTERY Left 06/15/2020   Procedure: LEFT LOWER EXTREMITY  THROMBECTOMY;  Surgeon: Maeola Harman, MD;  Location: [facility] OR;  Service: Vascular;  Laterality: Left;  . THROMBECTOMY FEMORAL ARTERY Right 07/05/2020   Procedure: EXPLORATION OF RIGHT GROIN WITH REPAIR OF FEMORAL ARTERY;  Surgeon: Chuck Hint, MD;  Location: [facility] OR;  Service: Vascular;  Laterality: Right;  . THROMBECTOMY FEMORAL- FEMORAL BYPASS (N/A )  06/01/2020  . THROMBECTOMY OF BYPASS GRAFT FEMORAL- POPLITEAL ARTERY Left 06/01/2020   Procedure: THROMBECTOMY OF LEFT BYPASS GRAFT FEMORAL-POPLITEAL ARTERY;  Surgeon: Leonie Douglas, MD;  Location[facility] OR;  Service: Vascular;  Laterality: Left;  . THROMBECTOMY OF LEFT BYPASS GRAFT FEMORAL-POPLITEAL ARTERY (Left )  06/01/2020  . VASCULAR SURGERY  04/2020  . VEIN HARVEST Right 06/15/2020   Procedure: RIGHT GREATER SAPHENOUS VEIN HARVEST;  Surgeon: Maeola Harman, MD;  [facility] OR;  Service: Vascular;  Laterality: Right;  Marland Kitchen VIDEO BRONCHOSCOPY N/A 05/23/2020   Procedure: VIDEO BRONCHOSCOPY WITH FLUORO;  Surgeon: Leslye Peer, MD;  Location: [facility] ENDOSCOPY;  Service: Cardiopulmonary;  Laterality: N/A;  . VIDEO BRONCHOSCOPY WITH ENDOBRONCHIAL NAVIGATION Right 06/06/2020   Procedure: VIDEO BRONCHOSCOPY WITH ENDOBRONCHIAL NAVIGATION;  Surgeon: Leslye Peer, MD;  [facility] ENDOS[facility];  Service: Pulmonary;  Laterality: Right;  . WOUND DEBRIDEMENT Right 06/14/2020   Procedure: RIGHT GROIN DEBRIDEMENT with pulsatile jet lavage of 11cm x 4cm x 4cm wound;  Surgeon: Maeola Harman, MD;  Location: [facility] OR;  Service: Vascular;  Laterality: Right;  . WOUND DEBRIDEMENT Bilateral 06/21/2020   Procedure: DEBRIDEMENT FOR GROIN WOUNDS;  Surgeon: Cephus Shelling, MD;  Location: [facility] OR;  Service: Vascular;  Laterality: Bilateral;     reports that she quit smoking about 5 months ago. Her smoking use included cigarettes. She has a 6.00 pack-year smoking history. She has never used smokeless tobacco. She reports previous  alcohol use. She reports that she does not use drugs.  No Known Allergies  Family History  Problem Relation Age of Onset  . Peripheral Artery Disease Neg Hx     Prior to Admission medications   Medication Sig Start Date End Date Taking? Authorizing Provider  acetaminophen (TYLENOL) 500 MG tablet Take 1,500 mg by mouth 3 (three) times daily as needed for moderate pain or headache.    [provider]  atropine 1 % ophthalmic solution Place 2 drops under the tongue 4 (four) times daily as needed. 07/17/20   Rhyne, Ames Coupe, PA-C  bisacodyl (DULCOLAX) 10 MG suppository Place 1 suppository (10 mg total) rectally daily as needed for moderate constipation. 07/17/20   Rhyne, Ames Coupe, PA-C  hydroxypropyl methylcellulose / hypromellose (ISOPTO TEARS / GONIOVISC) 2.5 % ophthalmic solution Place 1 drop into both eyes 3 (three) times daily as needed for dry eyes. 07/17/20   Rhyne, Ames Coupe, PA-C  hydrOXYzine (ATARAX/VISTARIL) 25 MG tablet Take 1 tablet (25 mg total) by mouth 3 (three) times daily as needed for anxiety. 07/17/20   Rhyne, Ames Coupe, PA-C  LORazepam (ATIVAN) 0.5 MG tablet Take 1 tablet (0.5 mg total) by mouth every 4 (four) hours as needed for anxiety. 07/17/20   Rhyne, Ames Coupe, PA-C  morphine 10 MG/5ML solution Take 2.5 mLs (5 mg total) by mouth every 4 (four) hours as needed for severe pain (dyspnea/pain). 07/17/20   Rhyne, Ames Coupe, PA-C  OLANZapine (ZYPREXA) 2.5 MG tablet Take 1 tablet (2.5 mg total) by mouth at bedtime. 07/17/20   Rhyne, Ames Coupe, PA-C  polyethylene glycol (MIRALAX / GLYCOLAX) 17 g packet Take 17 g by mouth daily. 07/17/20   Rhyne, Ames Coupe, PA-C  apixaban (ELIQUIS) 5 MG TABS tablet Take 1 tablet (5 mg total) by mouth 2 (two) times daily. 06/07/20 07/17/20  Lars Mage, PA-C    Physical Exam: Vitals:   10/02/20 1830 10/02/20 1845 10/02/20 1900 10/02/20 1930  BP:  (!) 149/103  (!) 144/115  Pulse: 66 64 65 66  Resp: 16 16 19 15   Temp:       TempSrc:      SpO2: 93% 93% 91% 93%    Physical Exam Vitals and nursing note reviewed.  Constitutional:      General: She is not in acute distress.    Appearance: She is ill-appearing.     Comments: Chronically ill appearing female  HENT:     Head: Normocephalic and atraumatic.     Nose: Nose normal. No rhinorrhea.  Cardiovascular:     Rate and Rhythm: Normal rate and regular rhythm.     Pulses: Normal pulses.  Pulmonary:     Effort: Pulmonary effort is normal. No respiratory distress.     Breath sounds: No wheezing or rales.  Abdominal:     General: There is no distension.  Tenderness: There is no abdominal tenderness.  Musculoskeletal:     Comments: Left AKA  Skin:    Comments: Left AKA with wet and dry gangrene. Exposed femur bone. Stage 4 sacral decubitus pressure ulcer. approx 6 cm in diameter.  See pictures  Neurological:     Comments: arousable but not alert or awake.            Labs on Admission: I have personally reviewed following labs and imaging studies  CBC: Recent Labs  Lab 10/02/20 1707  WBC 7.8  NEUTROABS 6.0  HGB 9.3*  HCT 29.9*  MCV 84.5  PLT 251   Basic Metabolic Panel: Recent Labs  Lab 10/02/20 1707  NA 136  K 3.3*  CL 100  CO2 27  GLUCOSE 110*  BUN 7*  CREATININE 0.41*  CALCIUM 8.4*   GFR: CrCl cannot be calculated (Unknown ideal weight.). Liver Function Tests: Recent Labs  Lab 10/02/20 1707  AST 16  ALT 14  ALKPHOS 67  BILITOT 0.8  PROT 6.5  ALBUMIN 2.1*   No results for input(s): LIPASE, AMYLASE in the last 168 hours. Recent Labs  Lab 10/02/20 1707  AMMONIA 24   Coagulation Profile: No results for input(s): INR, PROTIME in the last 168 hours. Cardiac Enzymes: No results for input(s): CKTOTAL, CKMB, CKMBINDEX, TROPONINI in the last 168 hours. BNP (last 3 results) No results for input(s): PROBNP in the last 8760 hours. HbA1C: No results for input(s): HGBA1C in the last 72 hours. CBG: Recent Labs   Lab 10/02/20 1726  GLUCAP 112*   Lipid Profile: No results for input(s): CHOL, HDL, LDLCALC, TRIG, CHOLHDL, LDLDIRECT in the last 72 hours. Thyroid Function Tests: No results for input(s): TSH, T4TOTAL, FREET4, T3FREE, THYROIDAB in the last 72 hours. Anemia Panel: No results for input(s): VITAMINB12, FOLATE, FERRITIN, TIBC, IRON, RETICCTPCT in the last 72 hours. Urine analysis:    Component Value Date/Time   COLORURINE YELLOW 10/02/2020 1649   APPEARANCEUR HAZY (A) 10/02/2020 1649   LABSPEC 1.010 10/02/2020 1649   PHURINE 7.0 10/02/2020 1649   GLUCOSEU NEGATIVE 10/02/2020 1649   HGBUR MODERATE (A) 10/02/2020 1649   BILIRUBINUR NEGATIVE 10/02/2020 1649   KETONESUR NEGATIVE 10/02/2020 1649   PROTEINUR NEGATIVE 10/02/2020 1649   NITRITE NEGATIVE 10/02/2020 1649   LEUKOCYTESUR LARGE (A) 10/02/2020 1649    Radiological Exams on Admission: I have personally reviewed images DG Chest 1 View  Result Date: 10/02/2020 CLINICAL DATA:  Altered mental status EXAM: CHEST  1 VIEW COMPARISON:  07/12/2020, CT 06/27/2020 FINDINGS: At least small left pleural effusion. Dense consolidation left lung base. Stable cardiomediastinal silhouette with aortic atherosclerosis. Mild central vascular congestion. No pneumothorax. IMPRESSION: At least small left pleural effusion with left basilar atelectasis or pneumonia. Vascular congestion Electronically Signed   By: Jasmine Pang M.D.   On: 10/02/2020 16:50   CT Head Wo Contrast  Result Date: 10/02/2020 CLINICAL DATA:  Altered mental status EXAM: CT HEAD WITHOUT CONTRAST TECHNIQUE: Contiguous axial images were obtained from the base of the skull through the vertex without intravenous contrast. COMPARISON:  None. FINDINGS: Brain: No evidence of acute infarction, hemorrhage, hydrocephalus, extra-axial collection or mass lesion/mass effect. Vascular: Intracranial atherosclerosis. Skull: Normal. Negative for fracture or focal lesion. Sinuses/Orbits: The visualized  paranasal sinuses are essentially clear. The mastoid air cells are unopacified. Other: None. IMPRESSION: Normal head CT. Electronically Signed   By: Charline Bills M.D.   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  I doubt the patient's altered mental status is due to UTI.  Patient has a chronic indwelling Foley and has been on 5 days of p.o. Cipro.  I think the daughter wants to make sure that the urinary tract infection is not the cause of the patient's ultimate demise.  I think that 3 doses of IV Rocephin will convince the daughter that the urinary tract infection is not the cause of the patient's altered mental status.  I have asked the daughter to contact their hospice agency and inquire about inpatient hospice options.  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  See pictures.  Not sure that wound care will be able to do anything.     Pressure ulcer of sacral region, stage 4 (HCC) Patient has a stage IV sacral decubitus ulcer.  There is visible bone.  It measures approximately 6 x 6 cm.  There is yellow/green slough.  This ulcer was present on admission.       DM2 (diabetes mellitus, type 2) (HCC) Chronic stable.  Chronic indwelling Foley catheter Chronic.  COPD (chronic obstructive pulmonary disease) (HCC) Stable.  Patient does not use home oxygen.  DNR (do not resuscitate)/DNI(Do Not Intubate) Confirmed the patient's DNR/DNI status with her daughter Efraim Kaufmann.  Hospice care patient Patient is a current  hospice patient.   DVT prophylaxis: Lovenox Code Status: DNR/DNI(Do NOT Intubate) Family Communication: discussed with dtr Melissa at bedside  Disposition Plan: 3 doses of IV Rocephin. Then discharge to home(with continued hospice) vs inpatient hospice Consults called: None  Admission status: Inpatient, Med-Surg   Carollee Herter, DO [facility] 10/02/2020, 8:23 PM

## 2020-10-02 NOTE — Assessment & Plan Note (Signed)
Confirmed the patient's DNR/DNI status with her daughter Efraim Kaufmann.

## 2020-10-02 NOTE — Assessment & Plan Note (Signed)
Stable.  Patient does not use home oxygen.

## 2020-10-03 DIAGNOSIS — J449 Chronic obstructive pulmonary disease, unspecified: Secondary | ICD-10-CM

## 2020-10-03 LAB — COMPREHENSIVE METABOLIC PANEL
ALT: 12 U/L (ref 0–44)
AST: 16 U/L (ref 15–41)
Albumin: 2.2 g/dL — ABNORMAL LOW (ref 3.5–5.0)
Alkaline Phosphatase: 62 U/L (ref 38–126)
Anion gap: 9 (ref 5–15)
BUN: 6 mg/dL — ABNORMAL LOW (ref 8–23)
CO2: 23 mmol/L (ref 22–32)
Calcium: 8.1 mg/dL — ABNORMAL LOW (ref 8.9–10.3)
Chloride: 102 mmol/L (ref 98–111)
Creatinine, Ser: 0.43 mg/dL — ABNORMAL LOW (ref 0.44–1.00)
GFR, Estimated: >60 mL/min (ref >60)
Glucose, Bld: 108 mg/dL — ABNORMAL HIGH (ref 70–99)
Potassium: 3.2 mmol/L — ABNORMAL LOW (ref 3.5–5.1)
Sodium: 134 mmol/L — ABNORMAL LOW (ref 135–145)
Total Bilirubin: 0.7 mg/dL (ref 0.3–1.2)
Total Protein: 6.2 g/dL — ABNORMAL LOW (ref 6.5–8.1)

## 2020-10-03 LAB — CBC WITH DIFFERENTIAL/PLATELET
Abs Immature Granulocytes: 0.02 10*3/uL (ref 0.00–0.07)
Basophils Absolute: 0 10*3/uL (ref 0.0–0.1)
Basophils Relative: 0 %
Eosinophils Absolute: 0.1 10*3/uL (ref 0.0–0.5)
Eosinophils Relative: 1 %
HCT: 30.1 % — ABNORMAL LOW (ref 36.0–46.0)
Hemoglobin: 9.3 g/dL — ABNORMAL LOW (ref 12.0–15.0)
Immature Granulocytes: 0 %
Lymphocytes Relative: 20 %
Lymphs Abs: 1.4 10*3/uL (ref 0.7–4.0)
MCH: 26.6 pg (ref 26.0–34.0)
MCHC: 30.9 g/dL (ref 30.0–36.0)
MCV: 86.2 fL (ref 80.0–100.0)
Monocytes Absolute: 0.6 10*3/uL (ref 0.1–1.0)
Monocytes Relative: 9 %
Neutro Abs: 4.9 10*3/uL (ref 1.7–7.7)
Neutrophils Relative %: 70 %
Platelets: 245 10*3/uL (ref 150–400)
RBC: 3.49 MIL/uL — ABNORMAL LOW (ref 3.87–5.11)
RDW: 15.5 % (ref 11.5–15.5)
WBC: 7 10*3/uL (ref 4.0–10.5)
nRBC: 0 % (ref 0.0–0.2)

## 2020-10-03 LAB — GLUCOSE, CAPILLARY
Glucose-Capillary: 121 mg/dL — ABNORMAL HIGH (ref 70–99)
Glucose-Capillary: 129 mg/dL — ABNORMAL HIGH (ref 70–99)
Glucose-Capillary: 126 mg/dL — ABNORMAL HIGH (ref 70–99)
Glucose-Capillary: 135 mg/dL — ABNORMAL HIGH (ref 70–99)

## 2020-10-03 LAB — HEMOGLOBIN A1C
Hgb A1c MFr Bld: 5.7 % — ABNORMAL HIGH (ref 4.8–5.6)
Mean Plasma Glucose: 116.89 mg/dL

## 2020-10-03 LAB — SARS CORONAVIRUS 2 (TAT 6-24 HRS): SARS Coronavirus 2: NEGATIVE

## 2020-10-03 MED ORDER — SERTRALINE HCL 50 MG PO TABS
50.0000 mg | ORAL_TABLET | Freq: Every day | ORAL | Status: DC
  Administered 2020-10-03 – 2020-10-05 (×3): 50 mg via ORAL
  Filled 2020-10-03 (×3): qty 1

## 2020-10-03 MED ORDER — CHLORHEXIDINE GLUCONATE CLOTH 2 % EX PADS
6.0000 | MEDICATED_PAD | Freq: Every day | CUTANEOUS | Status: DC
  Administered 2020-10-03 – 2020-10-05 (×3): 6 via TOPICAL

## 2020-10-03 MED ORDER — POTASSIUM CHLORIDE CRYS ER 20 MEQ PO TBCR
20.0000 meq | EXTENDED_RELEASE_TABLET | Freq: Once | ORAL | Status: AC
  Administered 2020-10-03: 20 meq via ORAL
  Filled 2020-10-03: qty 1

## 2020-10-03 MED ORDER — POTASSIUM CHLORIDE CRYS ER 20 MEQ PO TBCR: 40.0000 meq | EXTENDED_RELEASE_TABLET | Freq: Once | ORAL | Status: DC

## 2020-10-03 MED ORDER — LORAZEPAM 1 MG PO TABS
1.0000 mg | ORAL_TABLET | Freq: Four times a day (QID) | ORAL | Status: DC | PRN
  Administered 2020-10-03 – 2020-10-05 (×3): 1 mg via ORAL
  Filled 2020-10-03 (×3): qty 1

## 2020-10-03 MED ORDER — GABAPENTIN 300 MG PO CAPS
300.0000 mg | ORAL_CAPSULE | Freq: Three times a day (TID) | ORAL | Status: DC
  Administered 2020-10-03 – 2020-10-05 (×7): 300 mg via ORAL
  Filled 2020-10-03 (×7): qty 1

## 2020-10-03 MED ORDER — SENNA 8.6 MG PO TABS
1.0000 | ORAL_TABLET | Freq: Every day | ORAL | Status: DC
  Administered 2020-10-03 – 2020-10-05 (×3): 8.6 mg via ORAL
  Filled 2020-10-03 (×3): qty 1

## 2020-10-03 NOTE — Progress Notes (Signed)
Initial Nutrition Assessment  DOCUMENTATION CODES:  Not applicable  INTERVENTION:  Recommend liberalizing diet to regular - spoke with MD.  Add Magic cup TID with meals, each supplement provides 290 kcal and 9 grams of protein.  NUTRITION DIAGNOSIS:  Increased nutrient needs related to wound healing as evidenced by estimated needs.  GOAL:  Patient will meet greater than or equal to 90% of their needs  MONITOR:  PO intake,Supplement acceptance,Diet advancement,Labs,Weight trends,Skin  REASON FOR ASSESSMENT:  Malnutrition Screening Tool    ASSESSMENT:  70 yo female with PMH of severe PAD, T2DM, HTN, necrotic L AKA stump with exposed femur, and stage IV sacral decubitus ulcer, and with home hospice who presents with AMS d/t UTI.  Pt ate 100% of breakfast this morning. Per MD note, daughter wanted to confirm AMS was from UTI. Pt with good appetite today, wanting breakfast per MD note. Spoke with pt at bedside. Pt in good spirits, seems as if AMS is starting to clear up. Reports that she eats well and would like some ice cream.  Weight has been decreasing given circumstances. Pt lost 41 lbs (23% BW) since 07/07/20, which is significant and severe.  Recommend liberalizing diet to regular given good A1c and circumstances with hospice. Needs increased given large wounds for healing, however given circumstances, pt unlikely to meet these needs. Recommend Magic Cup TID for increased protein and calorie needs.  Medications: SSI, Senokot, ceftriaxone, IVF @ 175 ml/hr, morphine PRN (received twice today) Labs: reviewed; Na 134, K 3.2, CBG 73-129 HbA1c: 5.7% (09/2020)  NUTRITION - FOCUSED PHYSICAL EXAM: Given that pt is hospice, not appropriate for exam at this time  Diet Order:   Diet Order            Diet Carb Modified Fluid consistency: Thin; Room service appropriate? Yes  Diet effective now                EDUCATION NEEDS:  Education needs have been addressed  Skin:  Skin  Assessment: Skin Integrity Issues: Skin Integrity Issues:: Stage IV,Incisions,Other (Comment) Stage IV: Sacral decubitis Incisions: Groin, L leg Other: Necrotic L AKA stump with exposed femur  Last BM:  10/02/20 - Type 5, small amount  Height:  Ht Readings from Last 1 Encounters:  07/05/20 5\' 8"  (1.727 m)   Weight:  Wt Readings from Last 1 Encounters:  10/02/20 62.5 kg   Ideal Body Weight:  58.5 kg  BMI:  Body mass index is 20.95 kg/m.  Estimated Nutritional Needs:  Kcal:  1950-2150 Protein:  135-150 grams Fluid:  >2 L  10/04/20, RD, LDN Registered Dietitian After Hours/Weekend Pager # in Amion

## 2020-10-03 NOTE — Progress Notes (Signed)
PROGRESS NOTE  Holly Stevenson  BPZ:[id number] DOB: 1950-06-20 DOA: 10/02/2020 PCP: Maurice Small, MD  Outpatient Specialists: [facility] Brief Narrative: Holly Stevenson is a 70 y.o. 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 CT head nonacute. - Continue IV ceftriaxone. No leukocytosis or lactic acid elevation. +Pyuria and bacteriuria. Will monitor culture data. - Delirium precautions  Necrotic left AKA stump with exposed bone:  - Betadine and cover regularly per WOC  Hypokalemia:  - Supplement  Chronic indwelling foley catheter: Maintain catheter, pt is hospice care.  COPD: Stable  T2DM: Stable.   Hospice care patient: [facility] aware of admission, DNR POA confirmed. Remains hospice-appropriate with plans to return home with hospice care pending improvement in mentation.  Stage IV sacral pressure ulcer: POA. W > D daily per WOC.  DVT prophylaxis: Lovenox Code Status: DNR Family Communication: Daughter by phone this morning Disposition Plan:  Status is: Inpatient  Remains inpatient appropriate because:Altered mental status   Dispo: The patient is from: Home              Anticipated d/c is to: Home with resumption of hospice  services anticipated 4/20.              Patient currently is not medically stable to d/c.   Difficult to place patient No  Consultants:   None  Procedures:   None  Antimicrobials:  Ceftriaxone 4/18 >>   Subjective: Pt much more oriented and alert than she was at admission per daughter and in comparison to notes. She thinks she is here for a stubbed toe, but otherwise conversant. She has pain in the left leg. Wants to eat, but hasn't gotten breakfast tray yet. No N/V/abd pain. Denies diarrhea. Daughter is clear that the patient's mentation has not returned to baseline.  Objective: Vitals:   10/02/20 2100 10/02/20 2147 10/03/20 0220 10/03/20 0703  BP: (!) 149/103 (!) 157/52 (!) 157/49 (!) 181/62  Pulse: 71 68 66 68  Resp: 14 18 20 18   Temp: 98 F (36.7 C) 98.1 F (36.7 C) 98.4 F (36.9 C) 98 F (36.7 C)  TempSrc: Oral     SpO2: 96% 95% 93% 97%  Weight:  62.5 kg      Intake/Output Summary (Last 24 hours) at 10/03/2020 0901 Last data filed at 10/03/2020 0048 Gross per 24 hour  Intake 1100 ml  Output 250 ml  Net 850 ml   Filed Weights   10/02/20 2147  Weight: 62.5 kg    Gen: 70 y.o. female chronically ill-appearing Pulm: Non-labored breathing. Clear to auscultation bilaterally.  CV: Regular rate and rhythm. No murmur, rub, or gallop. No JVD, no pitting pedal edema. GI: Abdomen soft, non-tender, non-distended, with normoactive bowel sounds. No organomegaly or masses  felt. Ext: LLE with necrotic AKA stump and exposed bone on admission, dressing c/d/i this AM, not exposed during this exam Skin: Scattered ecchymoses throughout limbs, sacral pressure injury not examined, see pictures from H&P ~12 hours previously. Neuro: Alert and incompletely oriented. No focal neurological deficits. Psych: Judgement and insight appear impaired.  Data Reviewed: I have personally reviewed following labs and imaging studies  CBC: Recent Labs  Lab 10/02/20 1707 10/02/20 2103  10/03/20 0543  WBC 7.8 5.3 7.0  NEUTROABS 6.0  --  4.9  HGB 9.3* 7.5* 9.3*  HCT 29.9* 24.7* 30.1*  MCV 84.5 87.9 86.2  PLT 251 202 245   Basic Metabolic Panel: Recent Labs  Lab 10/02/20 1707 10/02/20 2103 10/03/20 0543  NA 136  --  134*  K 3.3*  --  3.2*  CL 100  --  102  CO2 27  --  23  GLUCOSE 110*  --  108*  BUN 7*  --  6*  CREATININE 0.41* <0.30* 0.43*  CALCIUM 8.4*  --  8.1*   GFR: Estimated Creatinine Clearance: 64.6 mL/min (A) (by C-G formula based on SCr of 0.43 mg/dL (L)). Liver Function Tests: Recent Labs  Lab 10/02/20 1707 10/03/20 0543  AST 16 16  ALT 14 12  ALKPHOS 67 62  BILITOT 0.8 0.7  PROT 6.5 6.2*  ALBUMIN 2.1* 2.2*   No results for input(s): LIPASE, AMYLASE in the last 168 hours. Recent Labs  Lab 10/02/20 1707  AMMONIA 24   Coagulation Profile: No results for input(s): INR, PROTIME in the last 168 hours. Cardiac Enzymes: No results for input(s): CKTOTAL, CKMB, CKMBINDEX, TROPONINI in the last 168 hours. BNP (last 3 results) No results for input(s): PROBNP in the last 8760 hours. HbA1C: Recent Labs    10/02/20 2103  HGBA1C 5.7*   CBG: Recent Labs  Lab 10/02/20 1726 10/02/20 2154 10/03/20 0732  GLUCAP 112* 101* 121*   Lipid Profile: No results for input(s): CHOL, HDL, LDLCALC, TRIG, CHOLHDL, LDLDIRECT in the last 72 hours. Thyroid Function Tests: No results for input(s): TSH, T4TOTAL, FREET4, T3FREE, THYROIDAB in the last 72 hours. Anemia Panel: No results for input(s): VITAMINB12, FOLATE, FERRITIN, TIBC, IRON, RETICCTPCT in the last 72 hours. Urine analysis:    Component Value Date/Time   COLORURINE YELLOW 10/02/2020 1649   APPEARANCEUR HAZY (A) 10/02/2020 1649   LABSPEC 1.010 10/02/2020 1649   PHURINE 7.0 10/02/2020 1649   GLUCOSEU NEGATIVE 10/02/2020 1649   HGBUR MODERATE (A) 10/02/2020 1649   BILIRUBINUR NEGATIVE 10/02/2020 1649   KETONESUR NEGATIVE 10/02/2020 1649   PROTEINUR NEGATIVE 10/02/2020 1649   NITRITE  NEGATIVE 10/02/2020 1649   LEUKOCYTESUR LARGE (A) 10/02/2020 1649   Recent Results (from the past 240 hour(s))  Blood Cultures (routine x 2)     Status: None (Preliminary result)   Collection Time: 10/02/20  4:46 PM   Specimen: BLOOD  Result Value Ref Range Status   Specimen Description   Final    BLOOD LEFT ANTECUBITAL Performed at [facility], [address] [place], [place] [address]    Special Requests   Final    BOTTLES DRAWN AEROBIC AND ANAEROBIC Blood Culture adequate volume Performed at [facility], [address] [place], [place] [address]    Culture   Final    NO GROWTH < 12 HOURS Performed at [facility], [address] [place], [place] [address]    Report Status PENDING  Incomplete  Blood Cultures (routine x  2)     Status: None (Preliminary result)   Collection Time: 10/02/20  4:46 PM   Specimen: BLOOD LEFT WRIST  Result Value Ref Range Status   Specimen Description   Final    BLOOD LEFT WRIST Performed at [facility], [address], [place], [place] [address]    Special Requests   Final    BOTTLES DRAWN AEROBIC AND ANAEROBIC Blood Culture adequate volume Performed at [facility], [address] [place], [place] [address]    Culture   Final    NO GROWTH < 12 HOURS Performed at [facility], [address], [place], [place] [address]    Report Status PENDING  Incomplete  SARS CORONAVIRUS 2 (TAT 6-24 HRS) Nasopharyngeal Nasopharyngeal Swab     Status: None   Collection Time: 10/02/20  7:43 PM   Specimen: Nasopharyngeal Swab  Result Value Ref Range Status   SARS Coronavirus 2 NEGATIVE NEGATIVE Final    Comment: (NOTE) SARS-CoV-2 target nucleic acids are NOT DETECTED.  The SARS-CoV-2 RNA is generally detectable in upper and lower respiratory specimens during the acute phase of infection. Negative results do not preclude SARS-CoV-2 infection, do not rule  out co-infections with other pathogens, and should not be used as the sole basis for treatment or other patient management decisions. Negative results must be combined with clinical observations, patient history, and epidemiological information. The expected result is Negative.  Fact Sheet for Patients: HairSlick.no  Fact Sheet for Healthcare Providers: quierodirigir.com  This test is not yet approved or cleared by the [place] FDA and  has been authorized for detection and/or diagnosis of SARS-CoV-2 by FDA under an Emergency Use Authorization (EUA). This EUA will remain  in effect (meaning this test can be used) for the duration of the COVID-19 declaration under Se ction 564(b)(1) of the Act, 21 U.S.C. section 360bbb-3(b)(1), unless the authorization is terminated or revoked sooner.  Performed at [facility], [address] [place], [place] [address]       Radiology Studies: DG Chest 1 View  Result Date: 10/02/2020 CLINICAL DATA:  Altered mental status EXAM: CHEST  1 VIEW COMPARISON:  07/12/2020, CT 06/27/2020 FINDINGS: At least small left pleural effusion. Dense consolidation left lung base. Stable cardiomediastinal silhouette with aortic atherosclerosis. Mild central vascular congestion. No pneumothorax. IMPRESSION: At least small left pleural effusion with left basilar atelectasis or pneumonia. Vascular congestion Electronically Signed   By: Jasmine Pang M.D.   On: 10/02/2020 16:50   CT Head Wo Contrast  Result Date: 10/02/2020 CLINICAL DATA:  Altered mental status EXAM: CT HEAD WITHOUT CONTRAST TECHNIQUE: Contiguous axial images were obtained from the base of the skull through the vertex without intravenous contrast. COMPARISON:  None. FINDINGS: Brain: No evidence of acute infarction, hemorrhage, hydrocephalus, extra-axial collection or mass lesion/mass effect. Vascular: Intracranial atherosclerosis. Skull:  Normal. Negative for fracture or focal lesion. Sinuses/Orbits: The visualized paranasal sinuses are essentially clear. The mastoid air cells are unopacified. Other: None. IMPRESSION: Normal head CT. Electronically Signed   By: Charline Bills M.D.   On: 10/02/2020 18:55    Scheduled Meds: . Chlorhexidine Gluconate Cloth  6 each Topical Daily  . enoxaparin (LOVENOX) injection  40 mg Subcutaneous Q24H  . insulin aspart  0-9 Units Subcutaneous TID WC  . potassium chloride  20 mEq Oral Once   Continuous Infusions: . sodium chloride 125 mL/hr at 10/02/20 2056  . cefTRIAXone (ROCEPHIN)  IV  LOS: 1 day   Time spent: 25 minutes.  Tyrone Nine, MD [facility] [email or web address] 10/03/2020, 9:01 AM

## 2020-10-03 NOTE — Progress Notes (Signed)
[id number]  - Civil engineer, contracting [facility]) - Hospitalized Hospice Patient Visit:   Holly Stevenson. Mcinnis is an [facility] hospice patient with a terminal diagnosis of PAD - LLE ischemia. Patient was transported to the ED for evaluation of altered mental status per family request after patient found minimally responsive/febrile.  She was admitted on 10/02/20 with altered mental status/possible  sepsis. Per Dr. Barbee Shropshire, an [facility] physician, this is a related admission. Patient is a DNR.   Checked in with bedside RN, who reported that patient is still receiving IV antibiotics for possible sepsis pending blood culture results, has been comfortable this shift and may discharge in the morning if medically stable. Visited patient in the room, who was alert/oriented to person and place denying discomfort but complains of exhaustion with frustration of being hospitalized. Listened and supported feelings. Patient also stated that she has a good appetite and tolerating diet well. Spoke with patient daughter Efraim Kaufmann) to support via telephone also notifying her that [facility] HLT will continue to follow patient daily during this hospitalization.   V/S:  181/62, 68, 18, with 97% sats on RA, 98.0 I&O: + 850 mls over last 24 hours Abnormal lab work: HGB 9.3, HCT 30.1, NA 134, K 3.2, Clucose 108, BUN 6, Create 0.43, Cal 8.1, Prot 6.2, Albumin 2.2, HGBa1C 5.7 Diagnostics:  DG Chest: Small left pleural effusion with left basilar atelectasis or pneumonia, CT Head: Normal IVs/PRNs: Rocephin 1g IVPB q24 hrs, adm 5mg  Morphine PO at 0042 and 0907 on 10/03/20   Epic MD Problem list: - Acute metabolic encephalopathy due to UTI: has improved but is not at baseline with initiation of antibiotics arguing for metabolic etiology. CT head nonacute. Continue IV ceftriaxone. No leukocytosis or lactic acid elevation. +Pyuria and bacteriuria. Will monitor culture data.- Delirium precautions - Necrotic left AKA stump with exposed bone: - Betadine and cover  regularly per WOC -Hypokalemia: - Supplement -  Chronic indwelling foley catheter: Maintain catheter  - COPD: Stable - T2DM: Stable.  - Stage IV sacral pressure ulcer: POA. W > D daily per WOC.    D/C planning- Plan per bedside RN, may discharge home in morning if medically stable. Please use [facility] for all transport needs for [facility] hospice patients upon discharge.   Goals of Care: Clear - DNR/POA confirmed. Patient wishes to return home with hospice support pending improvement in mentation. Communication with IDT- ACC team updated - AOR notified Communication with [facility] - spoke with daughter via telephone to update and support.    Please call with any hospice related questions/concerns,   [facility], RN [facility] Liaison (in [place]) [phone]

## 2020-10-03 NOTE — Progress Notes (Signed)
Poor judgement, requesting to go home.  Redirected patient to wait for MD, to discuss plan of care.  Patient resting quietly at present

## 2020-10-03 NOTE — Consult Note (Signed)
WOC Nurse wound consult note Consultation was completed by review of records, images and assistance from the bedside nurse/clinical staff.    Reason for Consult: stump wound and sacral wound.  Patient admitted for AMS in hospice care at home. Stump wound 100% necrotic with exposed femur bone.  Unstageable pressure injuries noted sacrum as well.  In for IV antibiotics to see if this will improve her mentation.  After 3 doses will convert to full comfort care, therefore I will add palliative wound care orders, wounds will not heal. Further deterioration is expected, potential sepsis from stump wound.   Wound type: 1. Unstageable pressure injury: sacrum 2. Arterial; gangrene; full thickness wound left lower extremity stump. Pressure Injury POA: Yes Measurement:see nursing flowsheets Wound bed: 1. Sacrum: 50% yellow slough that obscures the base of the wound bed; 50% pale pink, non granular 2. Stump 100% necrotic with exposed dead femur bone Drainage (amount, consistency, odor) malodorous per MD and nursing notes Periwound: intact; epibole of wound edges on sacral pressure injury Dressing procedure/placement/frequency: 1. Clean sacral wound with saline, pack with saline moist gauze, top with dry dressing. Secure with tape. Change daily 2. Paint left AKA wound with betadine swabs, allow to air dry, cover with dry dressing. Secure with kerlix and/or ACE in stump wrap formation.  3. Low air loss mattress for pressure redistribution; moisture management and comfort.   Discussed POC with patient and bedside nurse.  Re consult if needed, will not follow at this time. Thanks  Joseandres Mazer [employer], RN,CWOCN, CNS, CWON-AP [phone])

## 2020-10-04 DIAGNOSIS — N1831 Chronic kidney disease, stage 3a: Secondary | ICD-10-CM

## 2020-10-04 DIAGNOSIS — E1122 Type 2 diabetes mellitus with diabetic chronic kidney disease: Secondary | ICD-10-CM

## 2020-10-04 LAB — GLUCOSE, CAPILLARY
Glucose-Capillary: 113 mg/dL — ABNORMAL HIGH (ref 70–99)
Glucose-Capillary: 199 mg/dL — ABNORMAL HIGH (ref 70–99)
Glucose-Capillary: 93 mg/dL (ref 70–99)
Glucose-Capillary: 207 mg/dL — ABNORMAL HIGH (ref 70–99)

## 2020-10-04 NOTE — Discharge Summary (Addendum)
Physician Discharge Summary  Holly Stevenson ZOX:[id number] DOB: 1951/06/08 DOA: 10/02/2020  PCP: Maurice Small, MD  Admit date: 10/02/2020 Discharge date: 10/05/2020  Admitted From: Home  Discharge disposition: Home with hospice   Recommendations for Outpatient Follow-Up:   . Follow up with your primary care provider in 1 week.  Resume hospice care at home.  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  Diet recommendation:  Regular.  Wound care: See below  Code status: DNR   History of Present Illness:   Holly Stevenson is a 70 y.o. 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   Patient was thought to have UTI and was admitted hospital for further treatment.  Hospital Course:   Following conditions were addressed during hospitalization as listed below,  Acute metabolic encephalopathy due to UTI:  Patient is currently at baseline.  CT head was negative.  Patient received Rocephin IV and has completed the course.  Urine culture with different colonies of bacteria likely colonization.  Patient does have chronic Foley catheter.  Necrotic left AKA stump with exposed bone:  Wound care as per wound care nursing.  Hypokalemia:  Was supplemented.  Chronic indwelling foley catheter: Patient is on home hospice.  Continue catheter.  COPD: Stable at this  time.  Diabetes mellitus type 2.  Currently stable.  Hospice care patient: Plan for home hospice on discharge.  Disposition.  At this time, patient is stable for disposition home with hospice.  Spoke with the patient's niece at bedside.  Medical Consultants:    None.  Procedures:    Wound care Subjective:   Today, patient was seen and examined at bedside.  Denies any nausea vomiting fever chills rigors.  Discharge Exam:   Vitals:   10/03/20 2238 10/04/20 0511  BP: (!) 177/66 (!) 168/89  Pulse: 79 73  Resp: 18 20  Temp: 98 F (36.7 C) 97.7 F (36.5 C)  SpO2: 95% 95%   Vitals:   10/03/20 1048 10/03/20 1425 10/03/20 2238 10/04/20 0511  BP: (!) 164/69 (!) 149/74 (!) 177/66 (!) 168/89  Pulse: 79 66 79 73  Resp: 14 20 18 20   Temp: 98.1 F (36.7 C) 98.1 F (36.7 C) 98 F (36.7 C) 97.7 F (36.5 C)  TempSrc: Oral Oral    SpO2: 98% 97% 95% 95%  Weight:  62.5 kg      General: Alert awake, not in obvious distress, thinly built, chronically ill HENT: pupils equally reacting to light,  No scleral pallor or icterus noted. Oral mucosa is moist.  Chest:  Clear breath sounds.  Diminished breath sounds bilaterally. No crackles or wheezes.  CVS: S1 &S2 heard. No murmur.  Regular rate and rhythm. Abdomen: Soft, nontender, nondistended.  Bowel sounds are heard.  Chronic Foley catheter. Extremities: Left lower extremity with necrotic above-knee amputation stump with exposed bone covered with dressing. Psych: Alert, awake and oriented, normal mood CNS:  No cranial  nerve deficits.  Power equal in all extremities.   Skin: Warm and dry.  Ecchymosis noted.  Sacral decubitus ulceration present on admission s/p  The results of significant diagnostics from this hospitalization (including imaging, microbiology, ancillary and laboratory) are listed below for reference.     Diagnostic Studies:   DG Chest 1 View  Result Date: 10/02/2020 CLINICAL DATA:  Altered mental status EXAM:  CHEST  1 VIEW COMPARISON:  07/12/2020, CT 06/27/2020 FINDINGS: At least small left pleural effusion. Dense consolidation left lung base. Stable cardiomediastinal silhouette with aortic atherosclerosis. Mild central vascular congestion. No pneumothorax. IMPRESSION: At least small left pleural effusion with left basilar atelectasis or pneumonia. Vascular congestion Electronically Signed   By: Jasmine PangKim  Fujinaga M.D.   On: 10/02/2020 16:50   CT Head Wo Contrast  Result Date: 10/02/2020 CLINICAL DATA:  Altered mental status EXAM: CT HEAD WITHOUT CONTRAST TECHNIQUE: Contiguous axial images were obtained from the base of the skull through the vertex without intravenous contrast. COMPARISON:  None. FINDINGS: Brain: No evidence of acute infarction, hemorrhage, hydrocephalus, extra-axial collection or mass lesion/mass effect. Vascular: Intracranial atherosclerosis. Skull: Normal. Negative for fracture or focal lesion. Sinuses/Orbits: The visualized paranasal sinuses are essentially clear. The mastoid air cells are unopacified. Other: None. IMPRESSION: Normal head CT. Electronically Signed   By: Charline BillsSriyesh  Krishnan M.D.   On: 10/02/2020 18:55     Labs:   Basic Metabolic Panel: Recent Labs  Lab 10/02/20 1707 10/02/20 2103 10/03/20 0543  NA 136  --  134*  K 3.3*  --  3.2*  CL 100  --  102  CO2 27  --  23  GLUCOSE 110*  --  108*  BUN 7*  --  6*  CREATININE 0.41* <0.30* 0.43*  CALCIUM 8.4*  --  8.1*   GFR Estimated Creatinine Clearance: 64.6 mL/min (A) (by C-G formula based on SCr of 0.43 mg/dL (L)). Liver Function Tests: Recent Labs  Lab 10/02/20 1707 10/03/20 0543  AST 16 16  ALT 14 12  ALKPHOS 67 62  BILITOT 0.8 0.7  PROT 6.5 6.2*  ALBUMIN 2.1* 2.2*   No results for input(s): LIPASE, AMYLASE in the last 168 hours. Recent Labs  Lab 10/02/20 1707  AMMONIA 24   Coagulation profile No results for input(s): INR, PROTIME in the last 168 hours.  CBC: Recent Labs  Lab 10/02/20 1707  10/02/20 2103 10/03/20 0543  WBC 7.8 5.3 7.0  NEUTROABS 6.0  --  4.9  HGB 9.3* 7.5* 9.3*  HCT 29.9* 24.7* 30.1*  MCV 84.5 87.9 86.2  PLT 251 202 245   Cardiac Enzymes: No results for input(s): CKTOTAL, CKMB, CKMBINDEX, TROPONINI in the last 168 hours. BNP: Invalid input(s): POCBNP CBG: Recent Labs  Lab 10/03/20 0732 10/03/20 1135 10/03/20 1623 10/03/20 2240 10/04/20 0742  GLUCAP 121* 129* 126* 135* 113*   D-Dimer No results for input(s): DDIMER in the last 72 hours. Hgb A1c Recent Labs    10/02/20 2103  HGBA1C 5.7*   Lipid Profile No results for input(s): CHOL, HDL, LDLCALC, TRIG, CHOLHDL, LDLDIRECT in the last 72 hours. Thyroid function studies No results for input(s): TSH, T4TOTAL, T3FREE, THYROIDAB in the last 72 hours.  Invalid input(s): FREET3 Anemia work up No results for input(s): VITAMINB12, FOLATE, FERRITIN, TIBC, IRON, RETICCTPCT in the last 72 hours. Microbiology Recent Results (from the past 240 hour(s))  Blood Cultures (routine x 2)     Status: None (Preliminary result)   Collection Time: 10/02/20  4:46 PM  Specimen: BLOOD  Result Value Ref Range Status   Specimen Description   Final    BLOOD LEFT ANTECUBITAL Performed at [facility], [address] [place], [place] [address]    Special Requests   Final    BOTTLES DRAWN AEROBIC AND ANAEROBIC Blood Culture adequate volume Performed at [facility], [address], [place], [place] [address]    Culture   Final    NO GROWTH 2 DAYS Performed at [facility], [address], [place], [place] [address]    Report Status PENDING  Incomplete  Blood Cultures (routine x 2)     Status: None (Preliminary result)   Collection Time: 10/02/20  4:46 PM   Specimen: BLOOD LEFT WRIST  Result Value Ref Range Status   Specimen Description   Final    BLOOD LEFT WRIST Performed at [facility], [address], [place], [place] [address]     Special Requests   Final    BOTTLES DRAWN AEROBIC AND ANAEROBIC Blood Culture adequate volume Performed at [facility], [address], [place], [place] [address]    Culture   Final    NO GROWTH 2 DAYS Performed at [facility], [address] [place], [place] [address]    Report Status PENDING  Incomplete  Urine culture     Status: Abnormal (Preliminary result)   Collection Time: 10/02/20  4:50 PM   Specimen: Urine, Random  Result Value Ref Range Status   Specimen Description   Final    URINE, RANDOM Performed at [facility], [address], [place], [place] [address]    Special Requests   Final    NONE Performed at [facility], [address], [place], [place] [address]    Culture (A)  Final    40,000 COLONIES/mL STAPHYLOCOCCUS AUREUS >=100,000 COLONIES/mL YEAST CULTURE REINCUBATED FOR BETTER GROWTH Performed at [facility], [address] [place], [place] [address]    Report Status PENDING  Incomplete  SARS CORONAVIRUS 2 (TAT 6-24 HRS) Nasopharyngeal Nasopharyngeal Swab     Status: None   Collection Time: 10/02/20  7:43 PM   Specimen: Nasopharyngeal Swab  Result Value Ref Range Status   SARS Coronavirus 2 NEGATIVE NEGATIVE Final    Comment: (NOTE) SARS-CoV-2 target nucleic acids are NOT DETECTED.  The SARS-CoV-2 RNA is generally detectable in upper and lower respiratory specimens during the acute phase of infection. Negative results do not preclude SARS-CoV-2 infection, do not rule out co-infections with other pathogens, and should not be used as the sole basis for treatment or other patient management decisions. Negative results must be combined with clinical observations, patient history, and epidemiological information. The expected result is Negative.  Fact Sheet for Patients: HairSlick.no  Fact Sheet for Healthcare  Providers: quierodirigir.com  This test is not yet approved or cleared by the [place] FDA and  has been authorized for detection and/or diagnosis of SARS-CoV-2 by FDA under an Emergency Use Authorization (EUA). This EUA will remain  in effect (meaning this test can be used) for the duration of the COVID-19 declaration under Se ction 564(b)(1) of the Act, 21 U.S.C. section 360bbb-3(b)(1), unless the authorization is terminated or revoked sooner.  Performed at [facility], [address] [place], [place] [address]      Discharge Instructions:   Discharge Instructions    Call MD for:  persistant nausea and vomiting  Complete by: As directed    Call MD for:  severe uncontrolled pain   Complete by: As directed    Call MD for:  temperature >100.4   Complete by: As directed    Diet general   Complete by: As directed    Discharge instructions   Complete by: As directed    Follow-up with your primary care provider (hospice provider) in 1 week.   Discharge wound care:   Complete by: As directed    1. Clean sacral wound with saline, pack with saline moist gauze, top with dry dressing. Secure with tape. Change daily 2. Paint left AKA wound with betadine swabs, allow to air dry, cover with dry dressing. Secure with kerlix and/or ACE in stump wrap formation.   Increase activity slowly   Complete by: As directed      Allergies as of 10/04/2020   No Known Allergies     Medication List    TAKE these medications   acetaminophen 500 MG tablet Commonly known as: TYLENOL Take 1,000 mg by mouth 3 (three) times daily as needed for moderate pain or headache.   atorvastatin 40 MG tablet Commonly known as: LIPITOR Take 40 mg by mouth daily.   atropine 1 % ophthalmic solution Place 2 drops under the tongue 4 (four) times daily as needed.   benazepril 10 MG tablet Commonly known as: LOTENSIN Take 10 mg by mouth daily.   bisacodyl 10 MG  suppository Commonly known as: DULCOLAX Place 1 suppository (10 mg total) rectally daily as needed for moderate constipation.   ciprofloxacin 500 MG tablet Commonly known as: CIPRO Take 500 mg by mouth 2 (two) times daily. Start date : 09/27/20   [facility] Senna 8.6 MG tablet Generic drug: senna Take 1 tablet by mouth daily.   gabapentin 300 MG capsule Commonly known as: NEURONTIN Take 300 mg by mouth 3 (three) times daily.   hydroxypropyl methylcellulose / hypromellose 2.5 % ophthalmic solution Commonly known as: ISOPTO TEARS / GONIOVISC Place 1 drop into both eyes 3 (three) times daily as needed for dry eyes.   hydrOXYzine 25 MG tablet Commonly known as: ATARAX/VISTARIL Take 1 tablet (25 mg total) by mouth 3 (three) times daily as needed for anxiety.   LORazepam 1 MG tablet Commonly known as: ATIVAN Take 1 mg by mouth every 8 (eight) hours.   LORazepam 0.5 MG tablet Commonly known as: ATIVAN Take 1 tablet (0.5 mg total) by mouth every 4 (four) hours as needed for anxiety.   metroNIDAZOLE 500 MG tablet Commonly known as: FLAGYL 250 mg See admin instructions. Used for wound dressing 3 times a week .   morphine 30 MG tablet Commonly known as: MSIR Take 30 mg by mouth 2 (two) times daily.   morphine 10 MG/5ML solution Take 2.5 mLs (5 mg total) by mouth every 4 (four) hours as needed for severe pain (dyspnea/pain).   OLANZapine 2.5 MG tablet Commonly known as: ZYPREXA Take 1 tablet (2.5 mg total) by mouth at bedtime.   polyethylene glycol 17 g packet Commonly known as: MIRALAX / GLYCOLAX Take 17 g by mouth daily.   sertraline 50 MG tablet Commonly known as: ZOLOFT Take 1 tablet by mouth daily.            Discharge Care Instructions  (From admission, onward)         Start     Ordered   10/04/20 0000  Discharge wound care:       Comments: 1. Clean  sacral wound with saline, pack with saline moist gauze, top with dry dressing. Secure with tape. Change daily 2.  Paint left AKA wound with betadine swabs, allow to air dry, cover with dry dressing. Secure with kerlix and/or ACE in stump wrap formation.   10/04/20 1029          Follow-up Information    Maurice Small, MD Follow up.   Specialty: Family Medicine Contact information: [address] [place] [address] [phone]                Time coordinating discharge: 39 minutes  Signed:  Josedaniel Haye  [facility] 10/04/2020, 10:29 AM

## 2020-10-04 NOTE — Progress Notes (Signed)
10/04/2020 Daughter states she will be home in the morning. I asked SW if he could call for transport as early as 8am. He agreed to calling transport at 8am and her daughter agreed to be home.

## 2020-10-04 NOTE — Progress Notes (Signed)
1330- Patient's daughter, Efraim Kaufmann, called the unit with concerns regarding her mother's release this evening. I spoke to [facility] regarding her concerns. The first concern was that the antibiotics were going to be given at 1400 instead of 2000 this evening. I explained that this change was approved by Dr. Tyson Babinski and the pharmacist at rounds this morning. This change would also give more flexibility with the time of transporting this patient home. Melissa became verbally aggressive and stated that she "is the primary caretaker of the patient and will not be home to get her. She will not be coming home tonight." I, then spoke to [facility] regarding this second concern. I explained that if she is unable to get this patient at the time of discharge, our team can set up transportation to their residence this evening. Melissa expressed her frustration with this as this was "not the original plan and I am not going to be home." At this time, Melissa did not want to discuss any other options with me. Dr Tyson Babinski made aware of this situation.

## 2020-10-04 NOTE — Progress Notes (Signed)
[facility] [id number]  - Civil engineer, contracting [facility]) - Hospitalized Hospice Patient Visit:  Holly Stevenson is an [facility] hospice patient with a terminal diagnosis of PAD - LLE ischemia. Patient was transported to the ED for evaluation of altered mental status per family request after patient found minimally responsive/febrile.  She was admitted on 10/02/20 with altered mental status/possible  sepsis. Per Dr. Barbee Shropshire, an [facility] physician, this is a related admission. Patient is a DNR.  Visited at bedside. Patient is alert and oriented, denies pain. States she is ready to go home. Patient will receive one more dose of antibiotics and discharge tomorrow.  VS: 98.1, 134/72, 16, 95% ra I/O: 2324/1550  Abn Labs: 10/03/2020 05:43 Sodium: 134 (L) Potassium: 3.2 (L) Glucose: 108 (H) BUN: 6 (L) Creatinine: 0.43 (L) Calcium: 8.1 (L) Albumin: 2.2 (L) Total Protein: 6.2 (L) RBC: 3.49 (L) Hemoglobin: 9.3 (L) HCT: 30.1 (L)  Epic MD Problem list: - Acute metabolic encephalopathy due to UTI:  - Necrotic left AKA stump with exposed bone: - Betadine and cover regularly per WOC -Hypokalemia: - Supplement -  Chronic indwelling foley catheter: Maintain catheter - COPD: Stable - T2DM: Stable.  - Stage IV sacral pressure ulcer:POA. W > D daily per WOC.  D/C planning- Plan per bedside RN, may discharge home in morning if medically stable. Please use [facility] for all transport needs for [facility] hospice patients upon discharge.   Goals of Care: Clear - DNR/POA confirmed. Patient wishes to return home with hospice support pending improvement in mentation. Communication with IDT- ACC team updated - AOR notified Communication with PCG -updated  A Please do not hesitate to call with questions.   Thank you,   Elsie Saas, RN, [facility]      [facility] Liaison (listed on [facility] under Hospice [facility])    [phone]

## 2020-10-04 NOTE — Progress Notes (Signed)
10/04/2020  1355 Spoke with patient's daughter. She states that her antibiotic is not due until 8pm. Advised her it will be given at 2pm, she states that's too early and it could affect her kidneys. I explained to her that we could wait and give the dose at 8pm tonight, but her mom would be coming home around 11pm. She then yell she can't come home. I asked if its because of the antibiotic or is there another reason. She states Im working and I won't be home to get her. I informed her that her mom could be transported at any time, but insist that will not work because she not ready for to come home and she was told that it would be tomorrow.

## 2020-10-04 NOTE — Plan of Care (Signed)
  Problem: Activity: Goal: Risk for activity intolerance will decrease Outcome: Progressing   Problem: Nutrition: Goal: Adequate nutrition will be maintained Outcome: Progressing   Problem: Elimination: Goal: Will not experience complications related to bowel motility Outcome: Progressing   

## 2020-10-05 LAB — GLUCOSE, CAPILLARY: Glucose-Capillary: 207 mg/dL — ABNORMAL HIGH (ref 70–99)

## 2020-10-05 MED ORDER — IPRATROPIUM-ALBUTEROL 0.5-2.5 (3) MG/3ML IN SOLN
3.0000 mL | Freq: Three times a day (TID) | RESPIRATORY_TRACT | Status: DC
  Administered 2020-10-05: 3 mL via RESPIRATORY_TRACT

## 2020-10-05 MED ORDER — ONDANSETRON HCL 4 MG/2ML IJ SOLN
4.0000 mg | Freq: Once | INTRAMUSCULAR | Status: AC
  Administered 2020-10-05: 4 mg via INTRAVENOUS
  Filled 2020-10-05: qty 2

## 2020-10-05 MED ORDER — IPRATROPIUM-ALBUTEROL 0.5-2.5 (3) MG/3ML IN SOLN
3.0000 mL | RESPIRATORY_TRACT | Status: DC
  Administered 2020-10-05: 3 mL via RESPIRATORY_TRACT
  Filled 2020-10-05: qty 3

## 2020-10-05 NOTE — Progress Notes (Signed)
Patient c/o SHOB,  Wheezing, nausea and anxious. On-call provider was paged. IV fluids were paused. Zofran was administered. Will continue to monitor.

## 2020-10-05 NOTE — Progress Notes (Signed)
Patient seen and examined at bedside.  No interval changes compared to yesterday.    Vitals with BMI 10/05/2020 10/04/2020 10/04/2020  Height - - -  Weight - - -  BMI - - -  Systolic 157 189 785  Diastolic 80 85 72  Pulse 103 98 78   Please refer to discharge instructions  made on 10/04/2020.

## 2020-10-05 NOTE — TOC Transition Note (Signed)
Transition of Care [facility]) - CM/SW Discharge Note   Patient Details  Name: Holly Stevenson MRN: [id number] Date of Birth: February 03, 1951  Transition of Care [facility]) CM/SW Contact:  Ida Rogue, LCSW Phone Number: 10/05/2020, 9:15 AM   Clinical Narrative:   Patient who was stable for d/c yesterday was not d/ced as a series of events conspired getting her safely out.  MD confirmed she is ready to go this AM, called daughter to confirm her ability to receive patient at home, [employer] called to dispatch [facility]. TOC sign off.    Final next level of care: Home w Hospice Care Barriers to Discharge: No Barriers Identified   Patient Goals and CMS Choice        Discharge Placement                       Discharge Plan and Services                                     Social Determinants of Health (SDOH) Interventions     Readmission Risk Interventions Readmission Risk Prevention Plan 06/07/2020  Transportation Screening Complete  PCP or Specialist Appt within 5-7 Days Complete  Home Care Screening Complete  Medication Review (RN CM) Complete  Some recent data might be hidden

## 2020-10-06 LAB — URINE CULTURE: Culture: 40000 — AB

## 2020-10-07 LAB — CULTURE, BLOOD (ROUTINE X 2)
Culture: NO GROWTH
Culture: NO GROWTH
Special Requests: ADEQUATE
Special Requests: ADEQUATE

## 2020-11-15 DEATH — deceased

## 2021-06-06 IMAGING — RF DG C-ARM 1-60 MIN
1 series · 5 of 5 positions shown · non-contrast
Comparison: None.

CLINICAL DATA: 69-year-old female with peripheral vascular disease

EXAM:
JERAMIAH GARSIA/EXT/UNI/ OR; DG C-ARM 1-60 MIN

[Series 1: unknown protocol · 0.20mm/px · 5 of 5 slices shown]
[im 1/5]
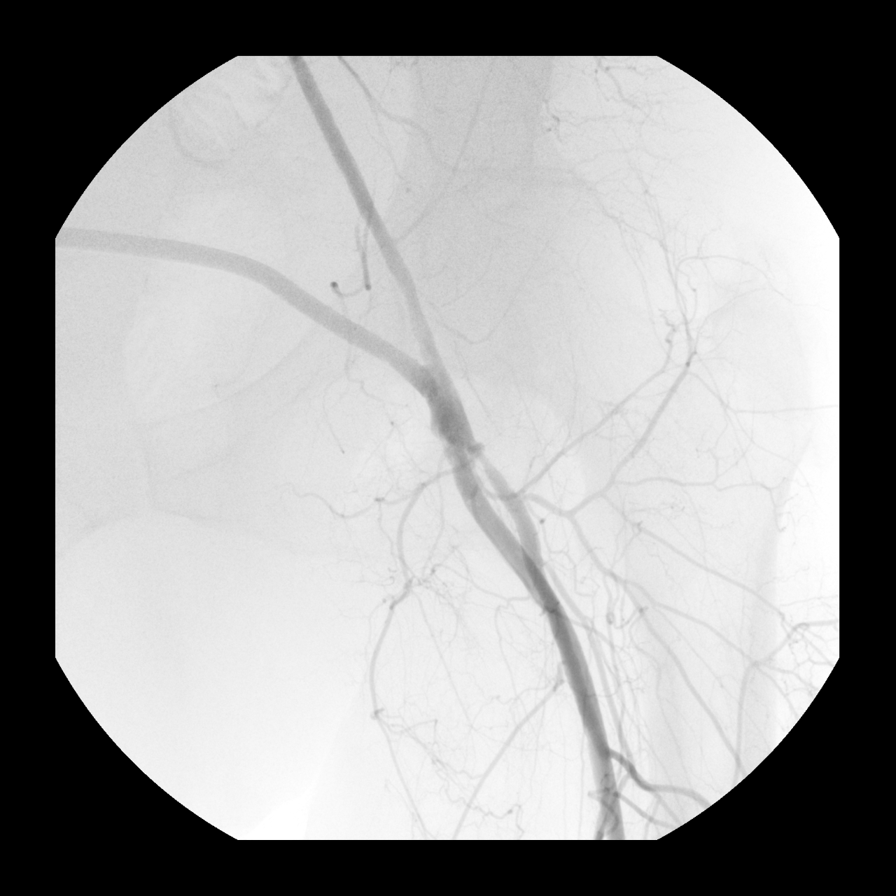
[im 2/5]
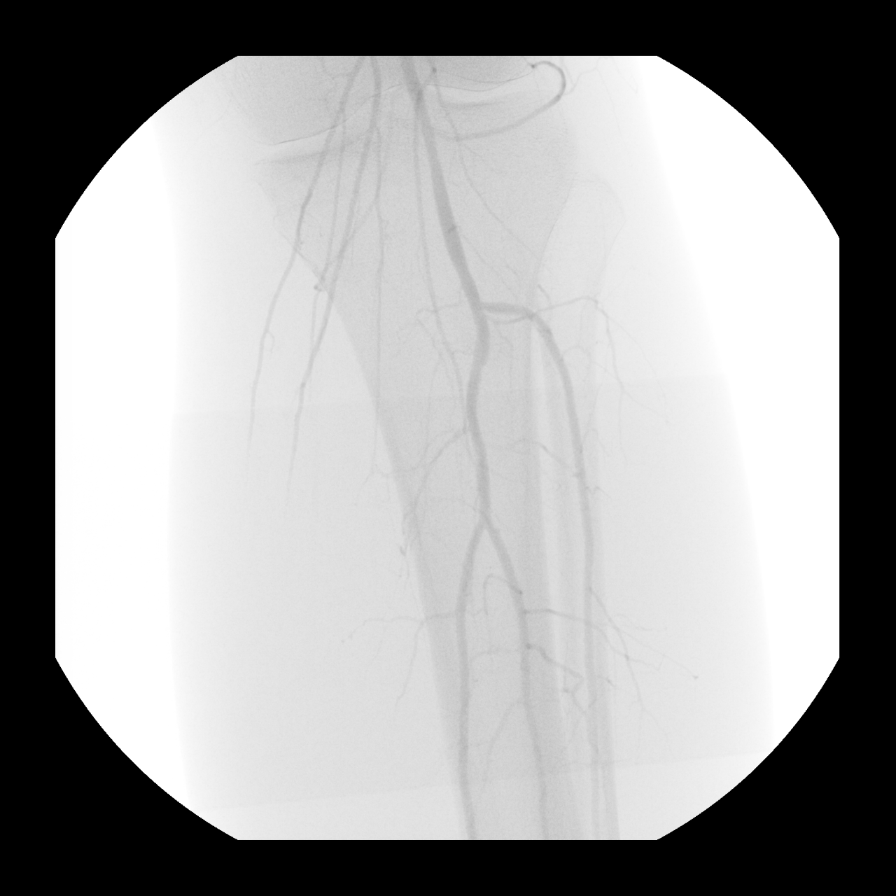
[im 3/5]
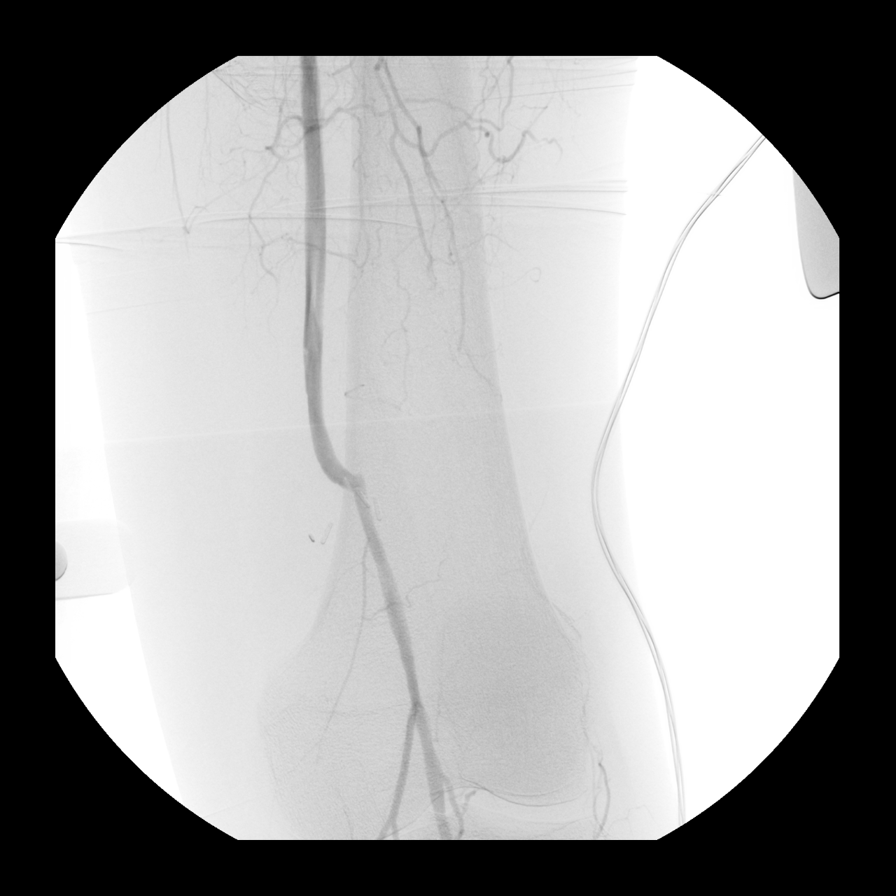
[im 4/5]
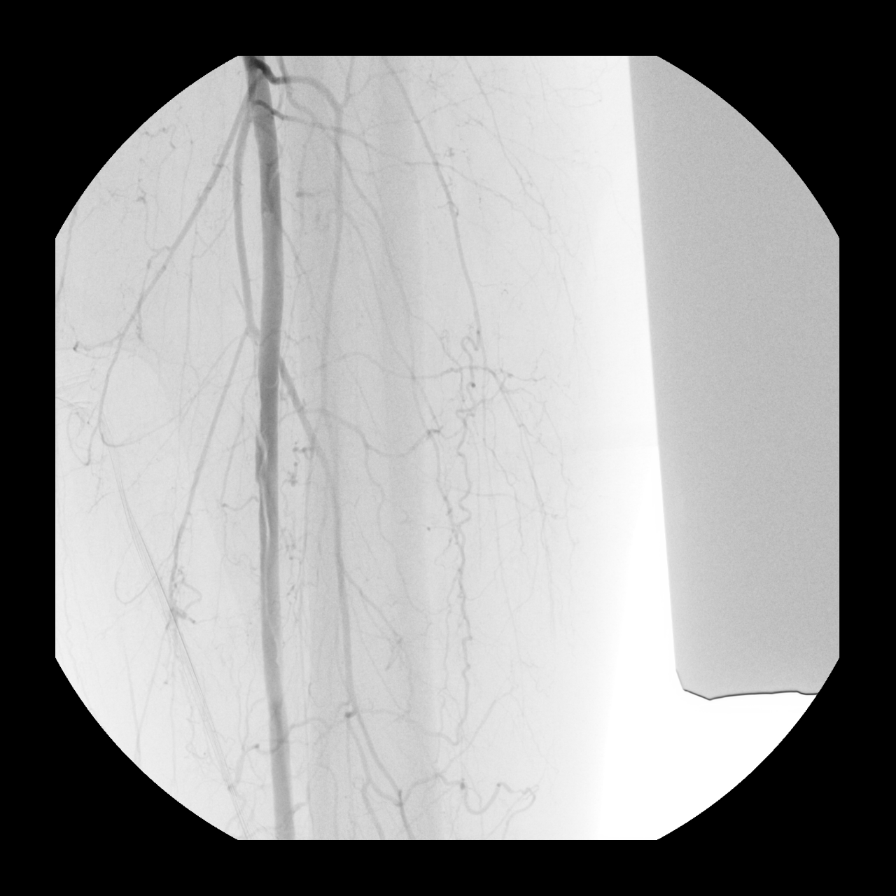
[im 5/5]
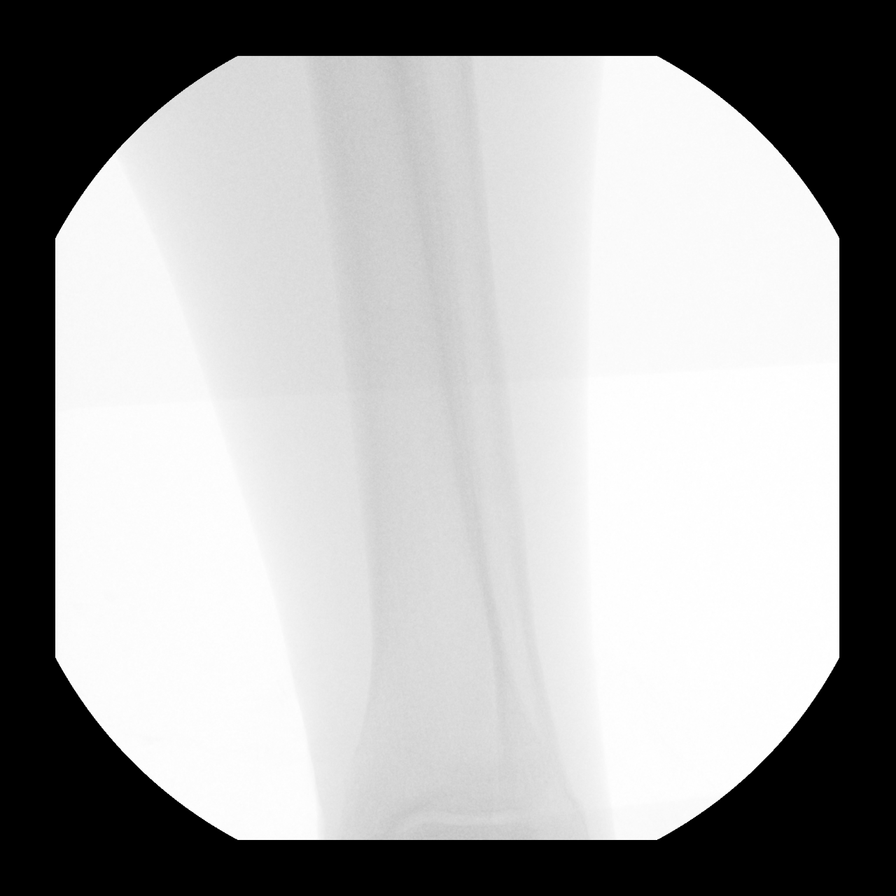

[5 of 5 positions shown; findings below may reference images not displayed]

FINDINGS: Limited intraoperative fluoroscopic spot images during lower
extremity angiogram.

Patency of a femoral femoral bypass graft is confirmed at the left
anastomosis with patent left profunda femoris and the thigh
branches.

Patent left-sided femoral-popliteal bypass graft. The P2 and P3
segment are patent as well as the tibial trifurcation.
IMPRESSION: Limited intraoperative fluoroscopic spot images during left lower
extremity angiogram, as above. Please refer to the dictated
operative report for full details of intraoperative findings and
procedure.

## 2021-06-22 IMAGING — DX DG CHEST 1V PORT
1 series · 1 of 1 positions shown · non-contrast
Comparison: Portable chest 06/16/2020 and earlier.

CLINICAL DATA: 69-year-old female status post bronchoscopic biopsy
last month. Respiratory failure.

EXAM:
PORTABLE CHEST 1 VIEW

[chest ap]
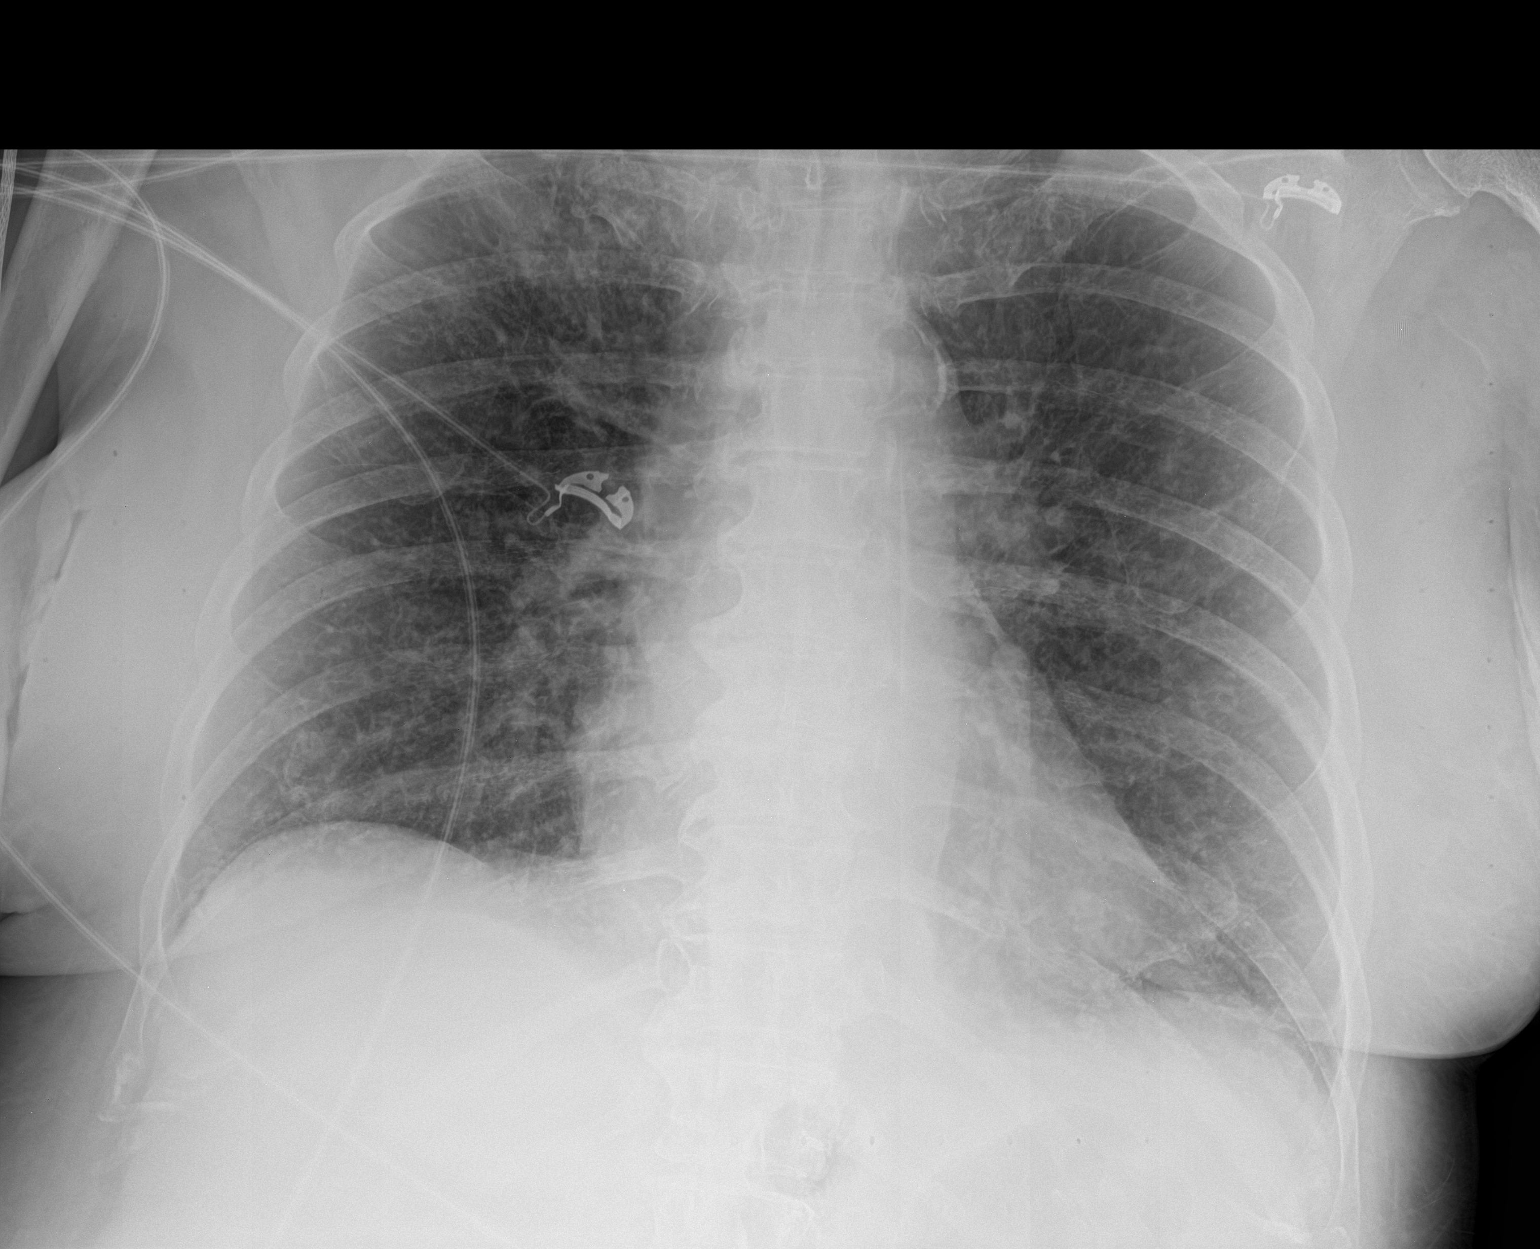

[1 of 1 positions shown; findings below may reference images not displayed]

FINDINGS: Portable AP semi upright view at 9817 hours. Extubated. Improved
lung volumes. Normal cardiac size and mediastinal contours.
Calcified aortic atherosclerosis. Coarse and patchy opacity in the
right upper lung associated with the known cavitary lesion is
stable. Stable increased interstitial markings elsewhere. No new
pulmonary opacity.

Paucity of bowel gas in the upper abdomen. Possible soft tissue gas
associated with the right breast, uncertain.
IMPRESSION: 1. Extubated with improved lung volumes.
Stable right upper lobe opacity associated with known cavitary
lesion.
No new cardiopulmonary abnormality.

2. Query soft tissue emphysema within the right breast.

## 2021-07-10 IMAGING — DX DG CHEST 1V PORT
1 series · 1 of 1 positions shown · non-contrast
Comparison: July 05, 2020 study obtained earlier in the day

CLINICAL DATA: Central catheter placement.  Intubation.

EXAM:
PORTABLE CHEST 1 VIEW

[chest ap]
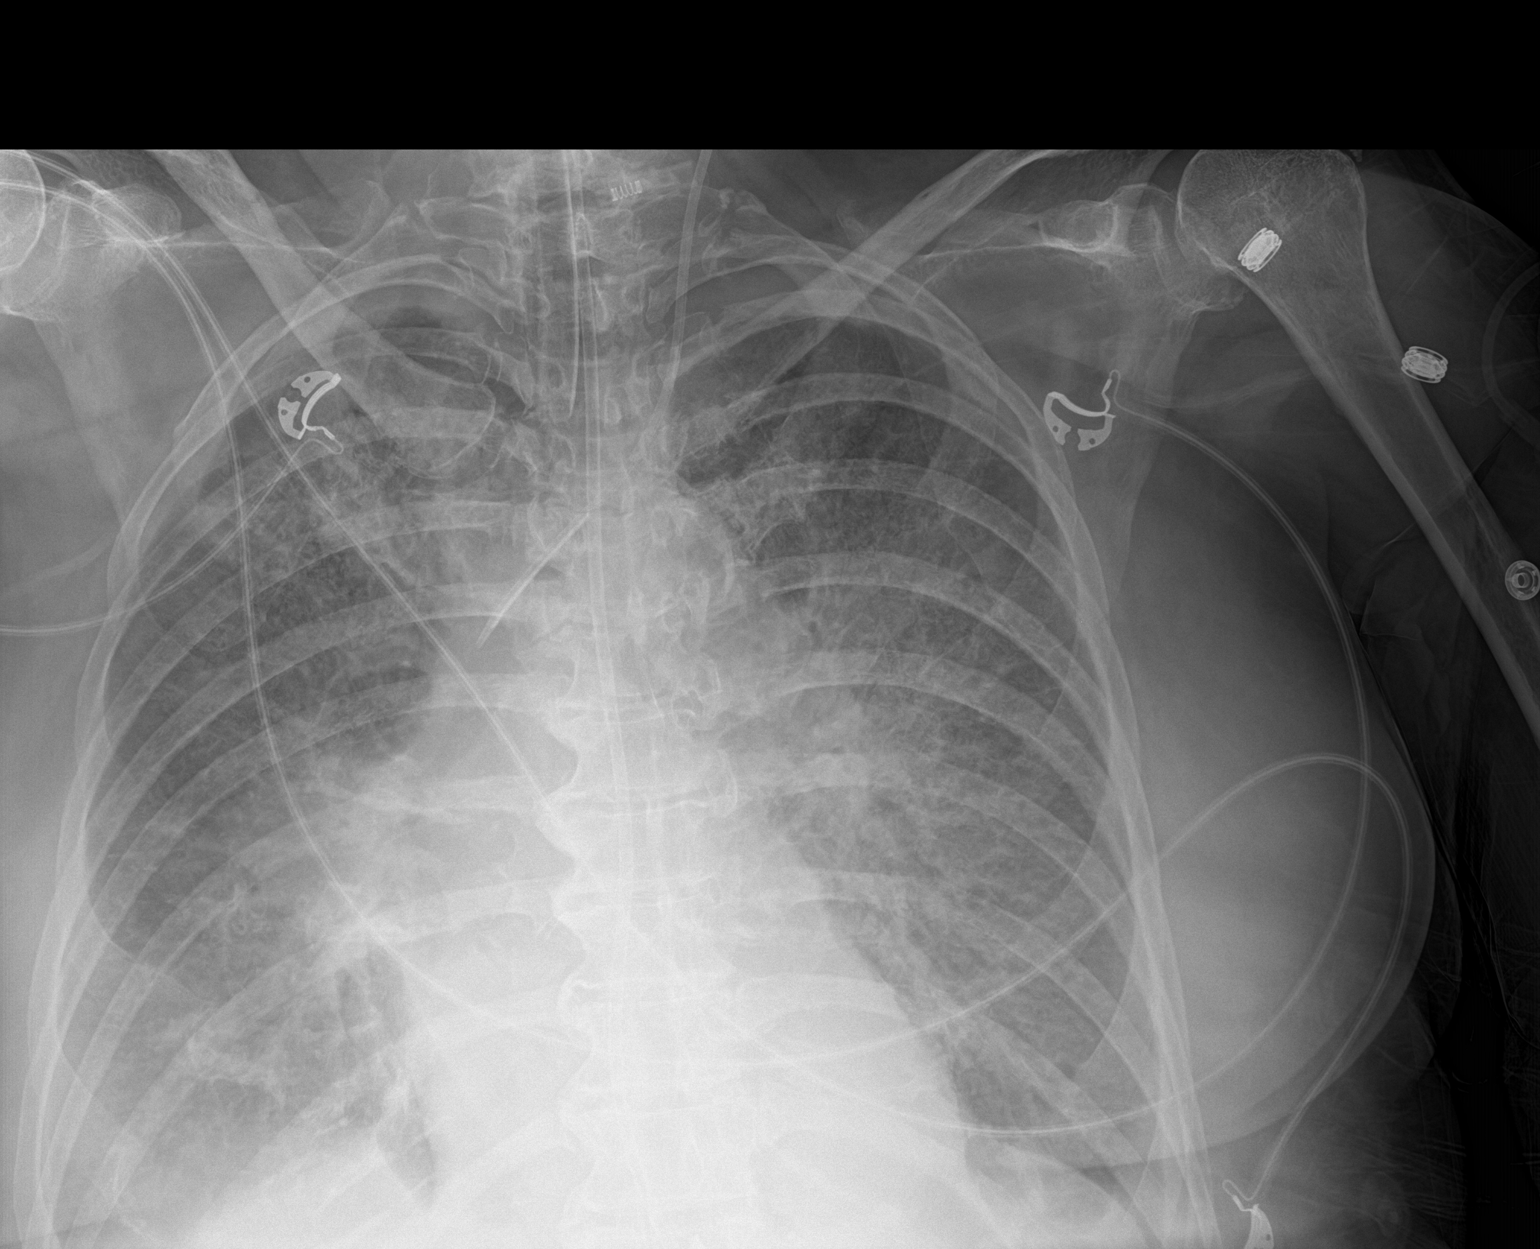

[1 of 1 positions shown; findings below may reference images not displayed]

FINDINGS: Endotracheal tube tip is 6.2 cm above the carina. Central catheter
tip is in the superior vena cava slightly beyond the junction with
the left innominate vein. Feeding tube tip is below the diaphragm.
No pneumothorax. Diffuse airspace opacity throughout the lungs
bilaterally is stable compared to earlier in the day. No new opacity
evident. Heart size and pulmonary vascularity are normal. No
adenopathy. There is aortic atherosclerosis. No bone lesions.
IMPRESSION: Tube and catheter positions as described without pneumothorax.
Stable multifocal airspace opacity bilaterally which could be
indicative of either pulmonary edema or multifocal pneumonia. Both
entities may be present concurrently. ARDS may also be superimposed.

Stable cardiac silhouette.

Aortic Atherosclerosis ([id number]).

## 2021-07-17 IMAGING — DX DG CHEST 1V
1 series · 1 of 1 positions shown · non-contrast
Comparison: 07/06/2020.

CLINICAL DATA: Shortness of breath.

EXAM:
CHEST  1 VIEW

[chest]
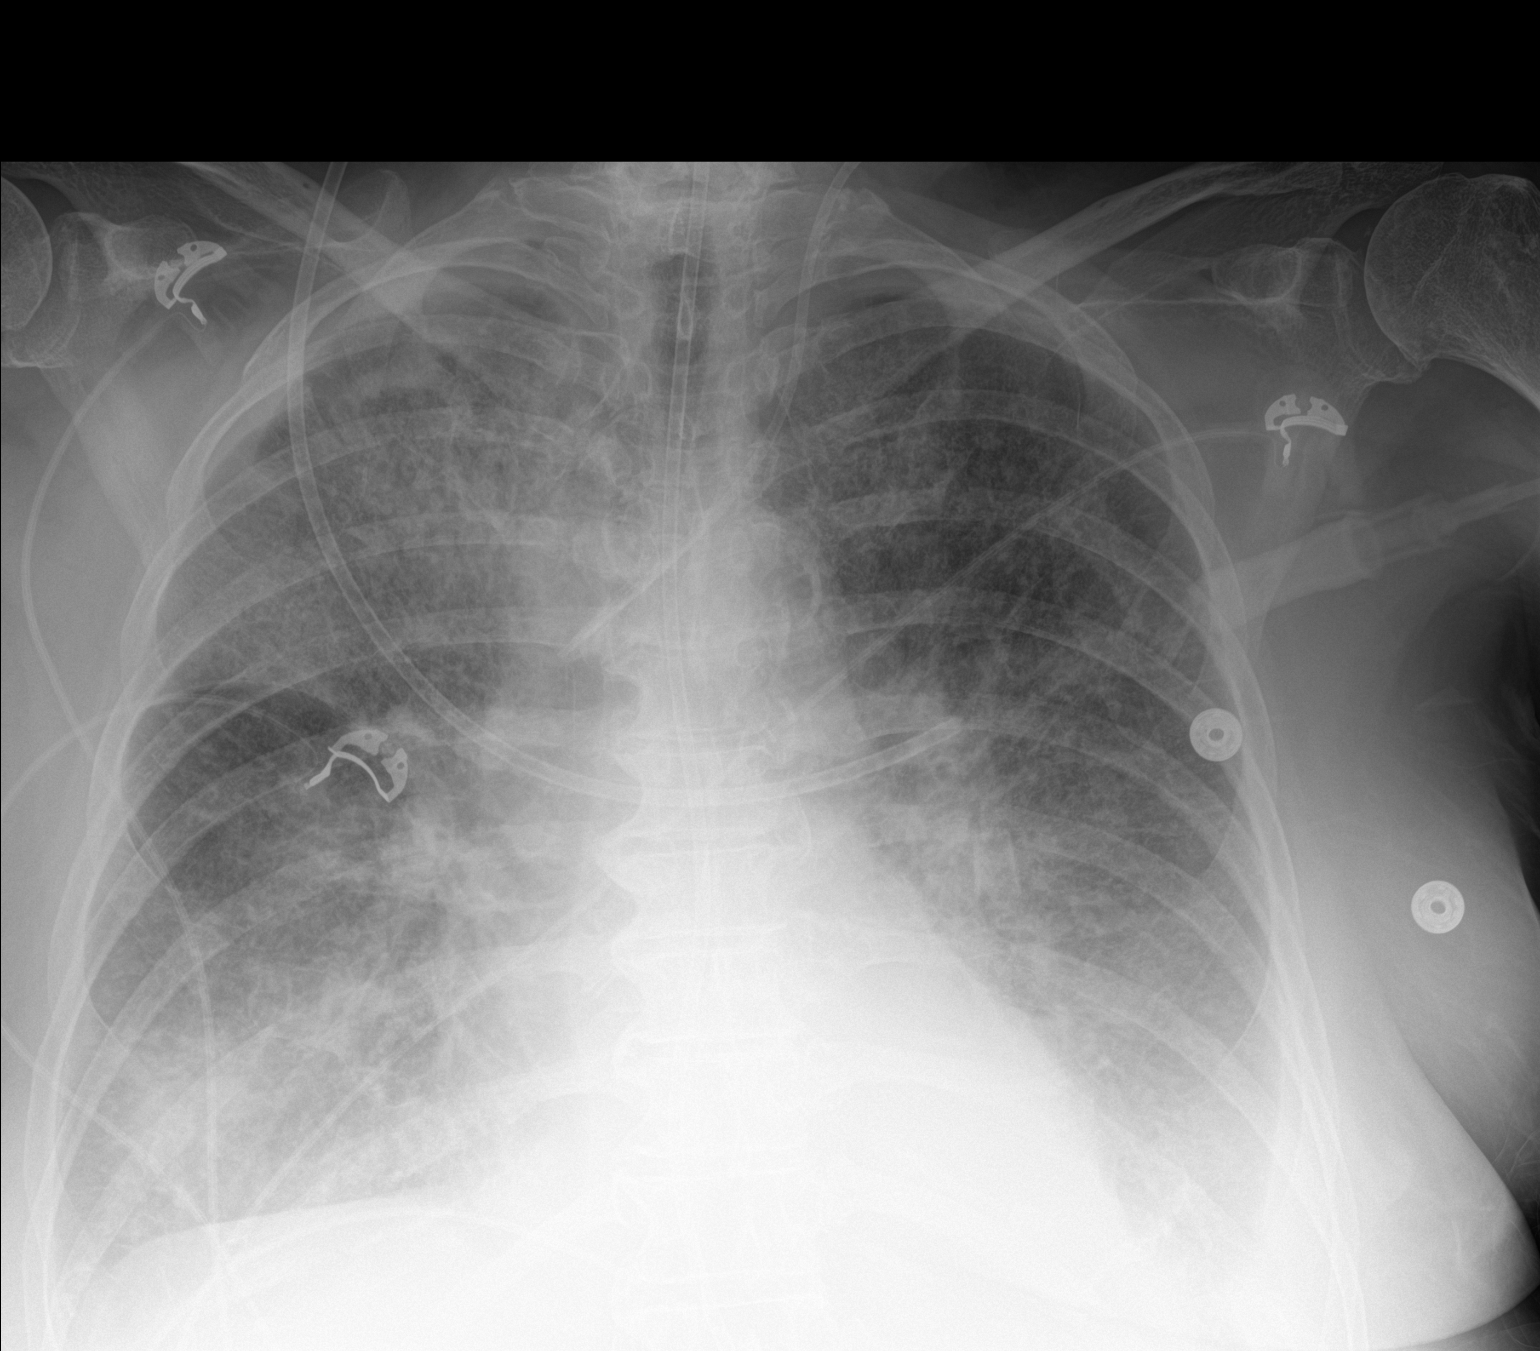

[1 of 1 positions shown; findings below may reference images not displayed]

FINDINGS: Interim removal of right chest tube. Feeding tube, left IJ line in
stable position. No pneumothorax noted on today's exam. Heart size
stable. Diffuse severe bilateral pulmonary infiltrates/edema noted
on today's exam. Small left pleural effusion noted. Degenerative
change thoracic spine.
IMPRESSION: 1. Interim removal of right chest tube. No pneumothorax noted on
today's exam. Feeding tube and left IJ line stable position.
2. Diffuse severe bilateral pulmonary infiltrates/edema noted on
today's exam. Small left pleural effusion noted.
